# Patient Record
Sex: Male | Born: 1965
Health system: Southern US, Community
[De-identification: ages and names within clinical notes are randomized; demographics above are authoritative.]

## PROBLEM LIST (undated history)

## (undated) ENCOUNTER — Emergency Department (HOSPITAL_COMMUNITY): Admission: EM | Payer: Non-veteran care | Source: Home / Self Care

## (undated) DIAGNOSIS — C801 Malignant (primary) neoplasm, unspecified: Secondary | ICD-10-CM

## (undated) DIAGNOSIS — F4310 Post-traumatic stress disorder, unspecified: Secondary | ICD-10-CM

## (undated) DIAGNOSIS — G8929 Other chronic pain: Secondary | ICD-10-CM

## (undated) DIAGNOSIS — IMO0002 Reserved for concepts with insufficient information to code with codable children: Secondary | ICD-10-CM

## (undated) DIAGNOSIS — G4730 Sleep apnea, unspecified: Secondary | ICD-10-CM

## (undated) DIAGNOSIS — I10 Essential (primary) hypertension: Secondary | ICD-10-CM

## (undated) DIAGNOSIS — M1990 Unspecified osteoarthritis, unspecified site: Secondary | ICD-10-CM

## (undated) DIAGNOSIS — M5430 Sciatica, unspecified side: Secondary | ICD-10-CM

## (undated) DIAGNOSIS — K8020 Calculus of gallbladder without cholecystitis without obstruction: Secondary | ICD-10-CM

## (undated) DIAGNOSIS — M25569 Pain in unspecified knee: Secondary | ICD-10-CM

## (undated) DIAGNOSIS — Z933 Colostomy status: Secondary | ICD-10-CM

## (undated) DIAGNOSIS — K859 Acute pancreatitis without necrosis or infection, unspecified: Principal | ICD-10-CM

## (undated) HISTORY — PX: OTHER SURGICAL HISTORY: SHX169

## (undated) HISTORY — PX: CHOLECYSTECTOMY: SHX55

## (undated) HISTORY — PX: COLON SURGERY: SHX602

---

## 1999-09-19 ENCOUNTER — Emergency Department (HOSPITAL_COMMUNITY): Admission: EM | Admit: 1999-09-19 | Discharge: 1999-09-19 | Payer: Self-pay | Admitting: Emergency Medicine

## 2004-04-15 ENCOUNTER — Emergency Department (HOSPITAL_COMMUNITY): Admission: EM | Admit: 2004-04-15 | Discharge: 2004-04-16 | Payer: Self-pay | Admitting: Emergency Medicine

## 2004-04-15 IMAGING — CR DG SHOULDER 2+V*L*
3 series · 3 of 3 positions shown · non-contrast
Comparison: none

CLINICAL DATA: Left shoulder pain.  
 LEFT SHOULDER, THREE VIEWS
 The glenohumeral joint appears normal.  There is degenerative arthritis at the AC joint with some vacuum phenomenon in the joint and mild osteophytes.
 IMPRESSION 
 Degenerative disease of the AC joint.  No acute finding.

[view not recorded (1 of 3)]
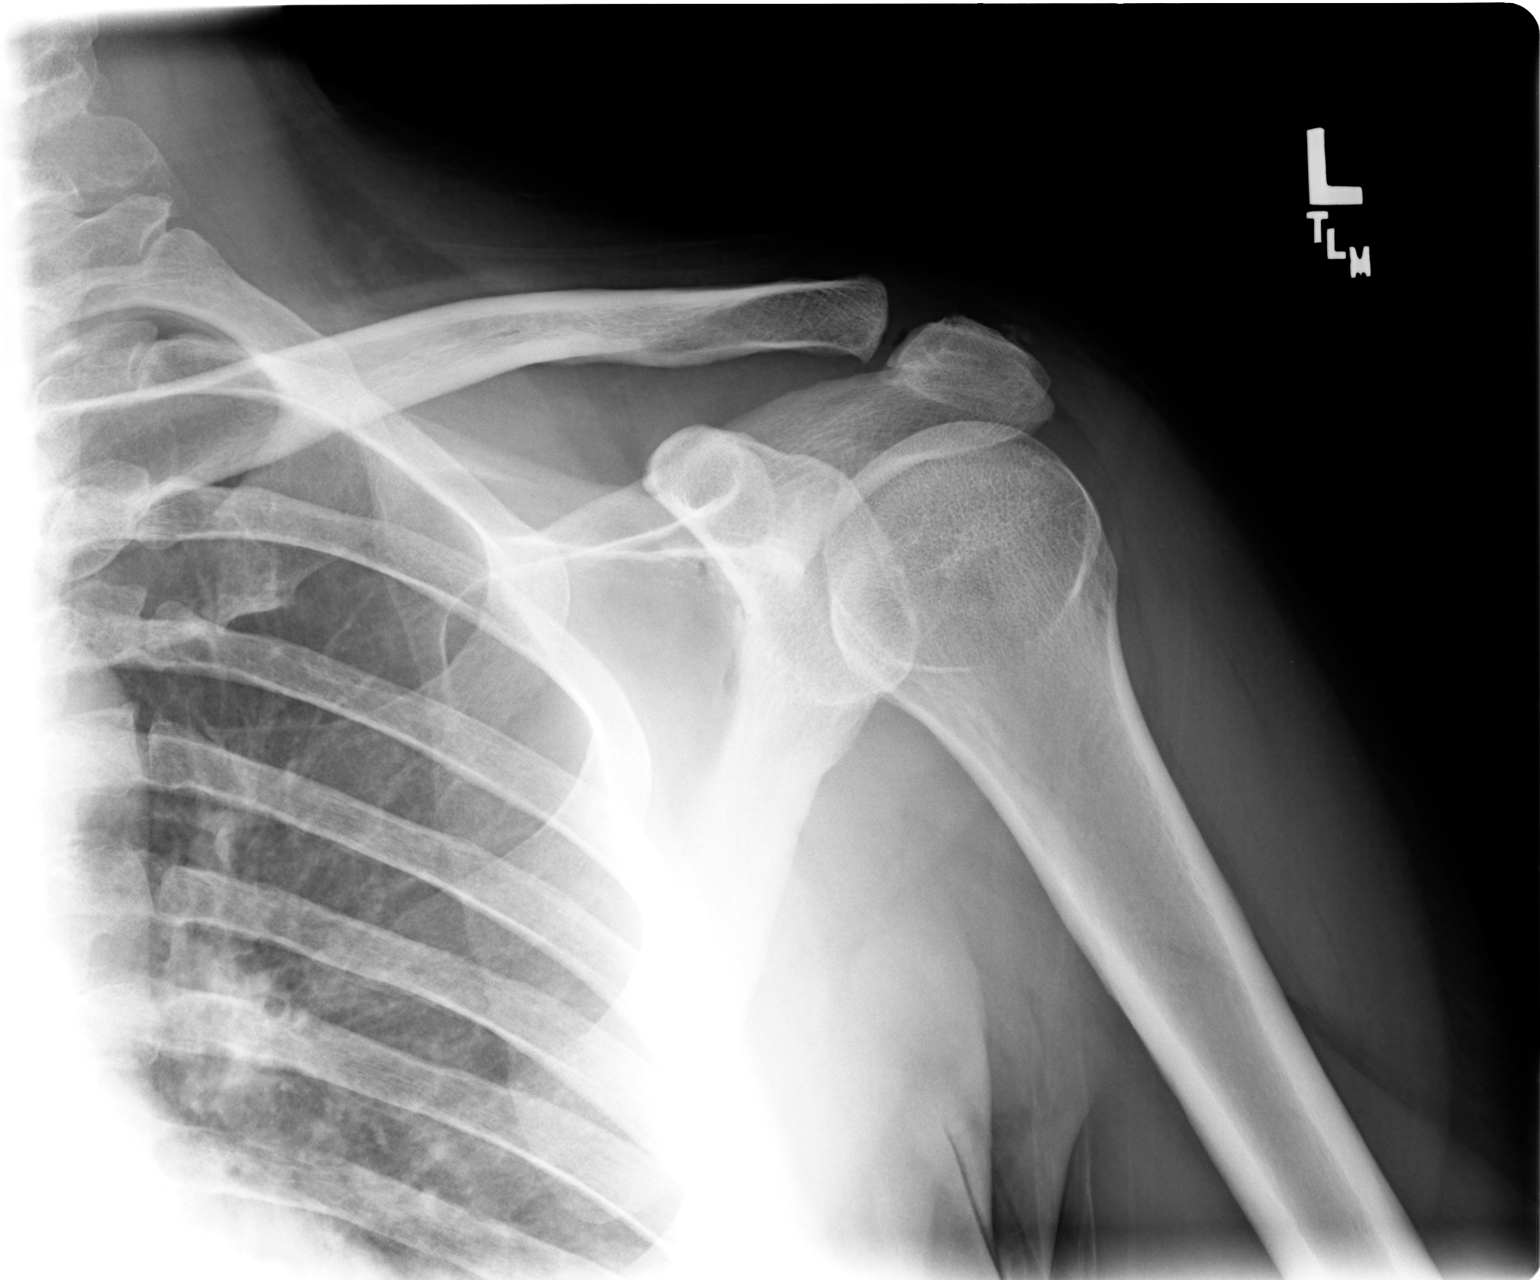

[view not recorded (2 of 3)]
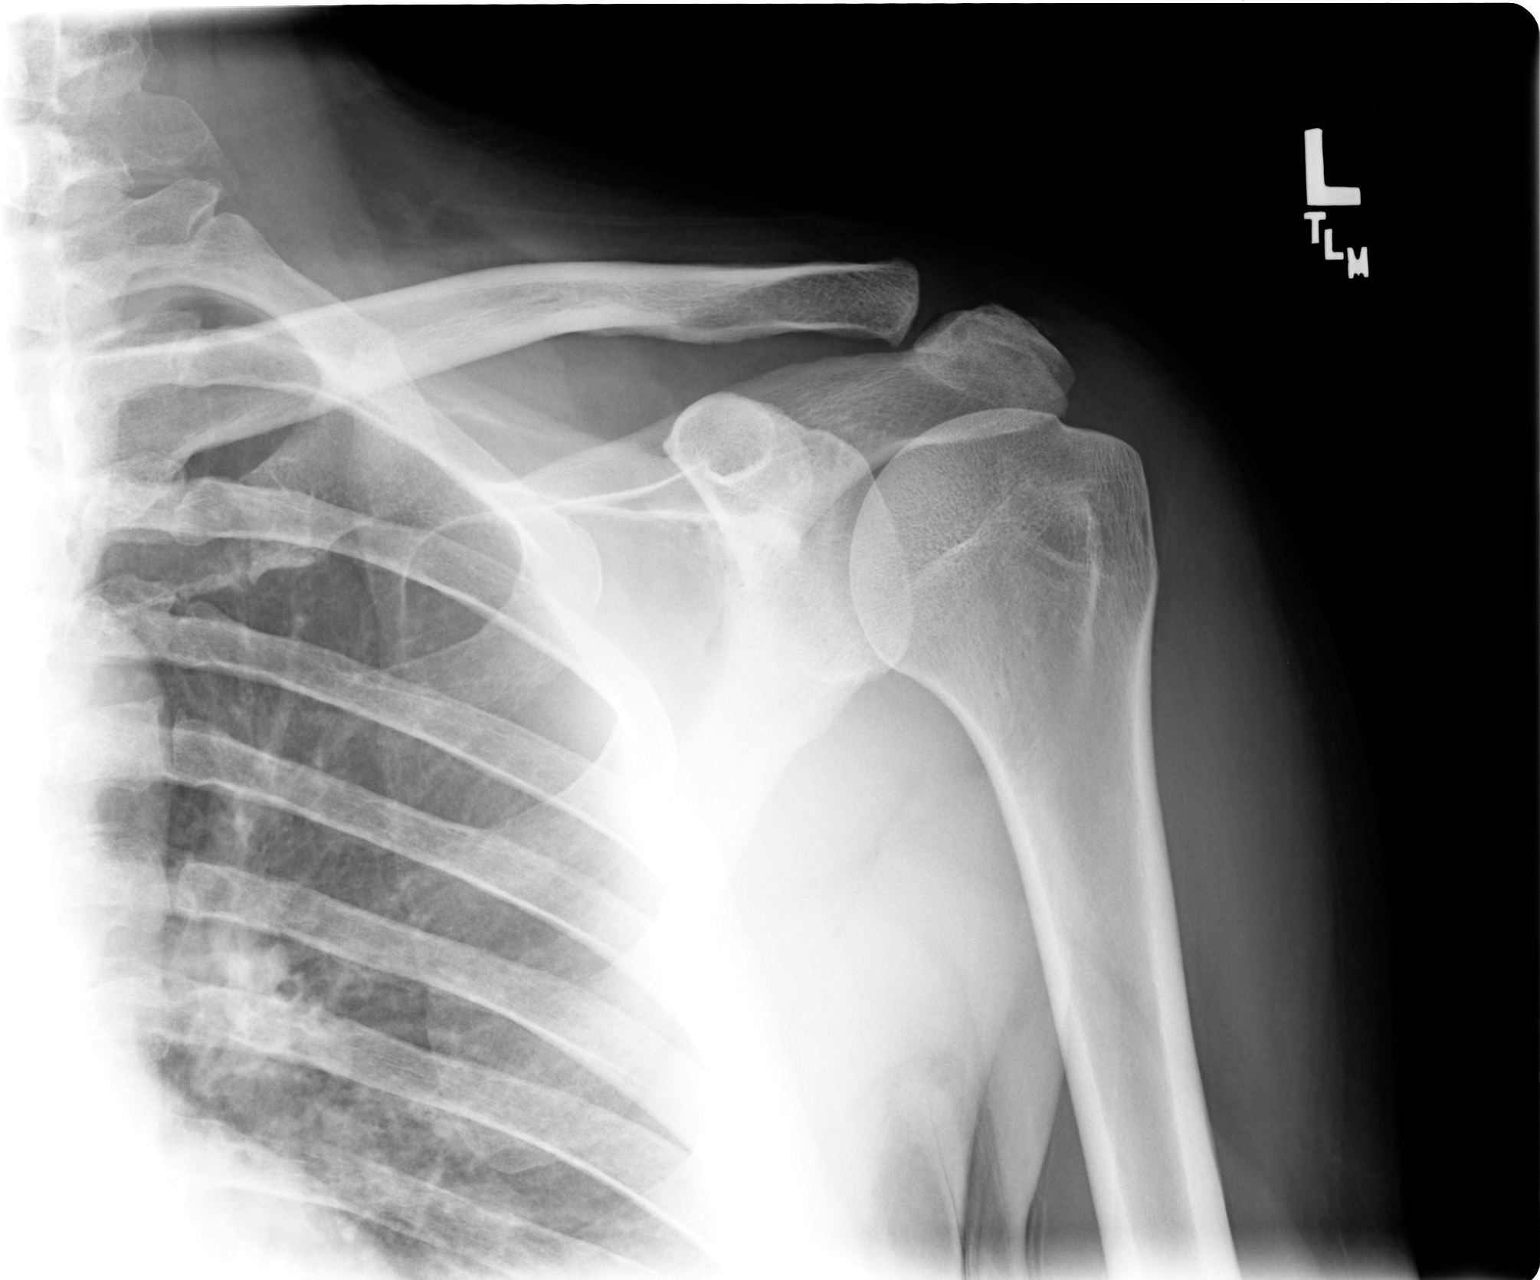

[view not recorded (3 of 3)]
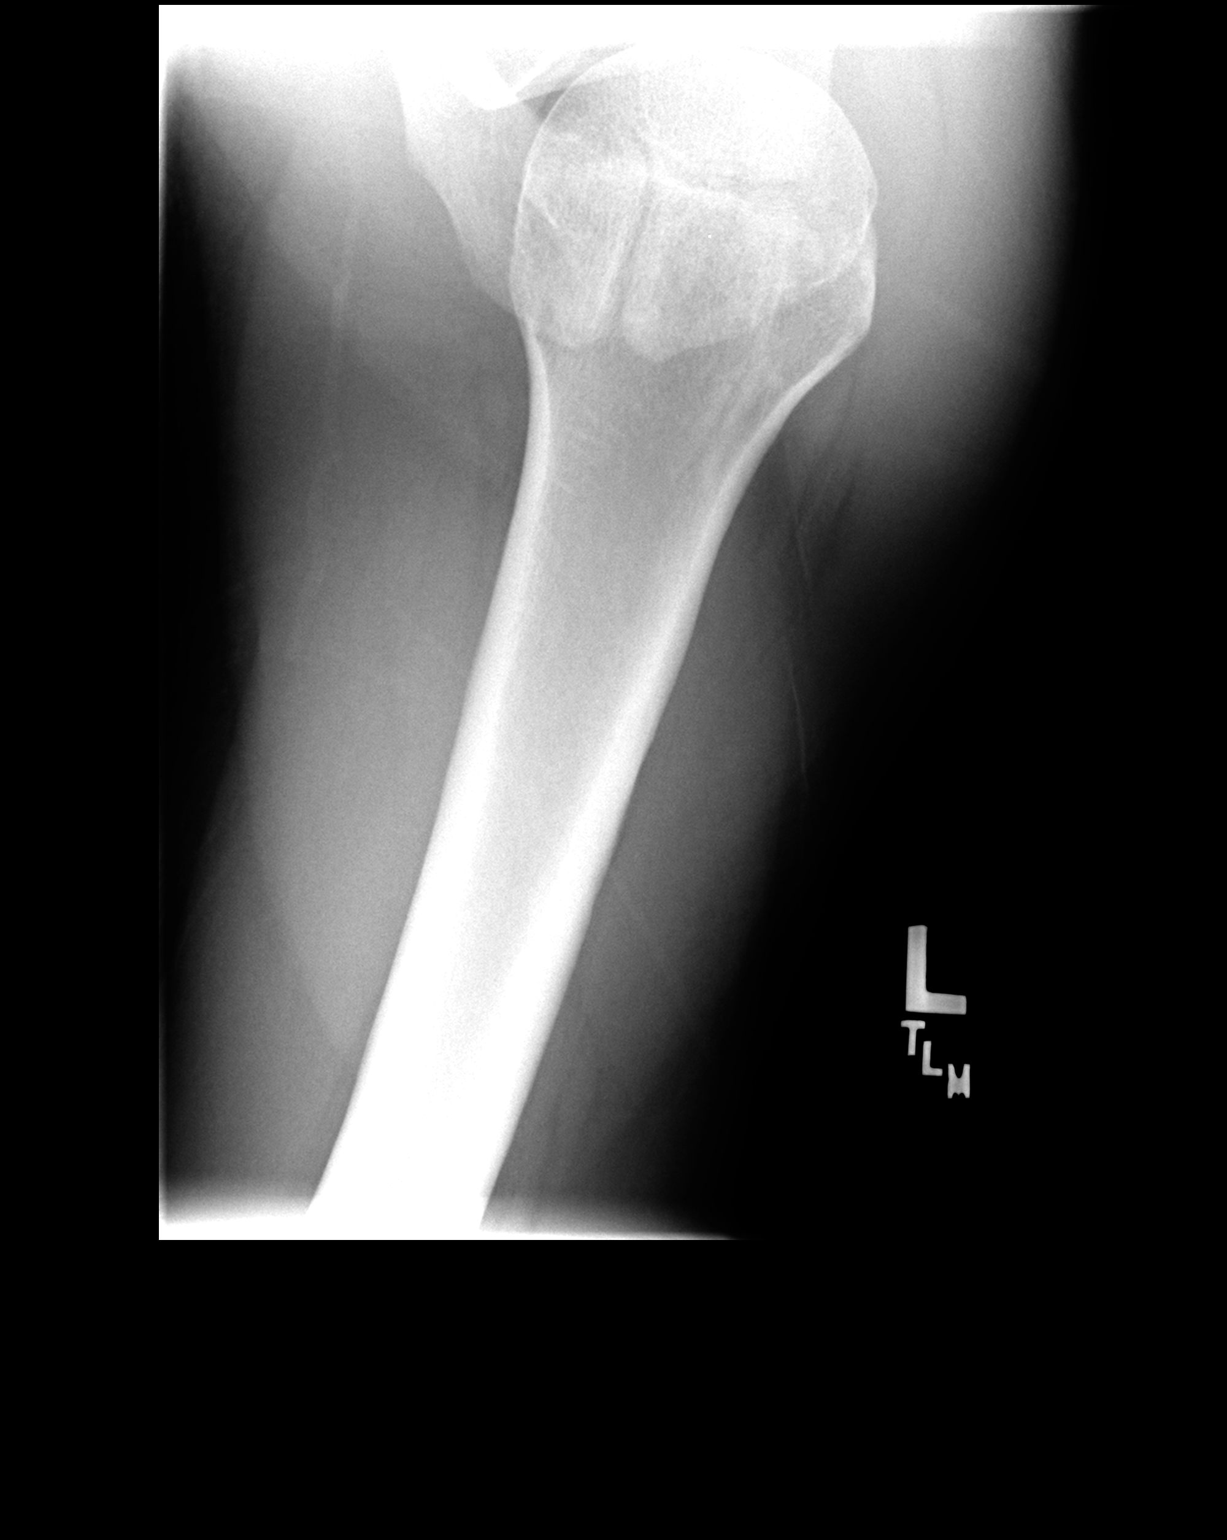

[3 of 3 positions shown; findings below may reference images not displayed]

## 2007-01-21 ENCOUNTER — Emergency Department (HOSPITAL_COMMUNITY): Admission: EM | Admit: 2007-01-21 | Discharge: 2007-01-21 | Payer: Self-pay | Admitting: Emergency Medicine

## 2008-10-17 ENCOUNTER — Emergency Department (HOSPITAL_COMMUNITY): Admission: EM | Admit: 2008-10-17 | Discharge: 2008-10-17 | Payer: Self-pay | Admitting: Emergency Medicine

## 2008-10-17 ENCOUNTER — Encounter: Payer: Self-pay | Admitting: Orthopedic Surgery

## 2008-10-17 IMAGING — CR DG KNEE COMPLETE 4+V*L*
4 series · 4 of 4 positions shown · non-contrast
Comparison: None.

CLINICAL DATA: Knee pain 2 weeks.  No known injury.

LEFT KNEE - COMPLETE 4+ VIEW

[view not recorded (1 of 4)]
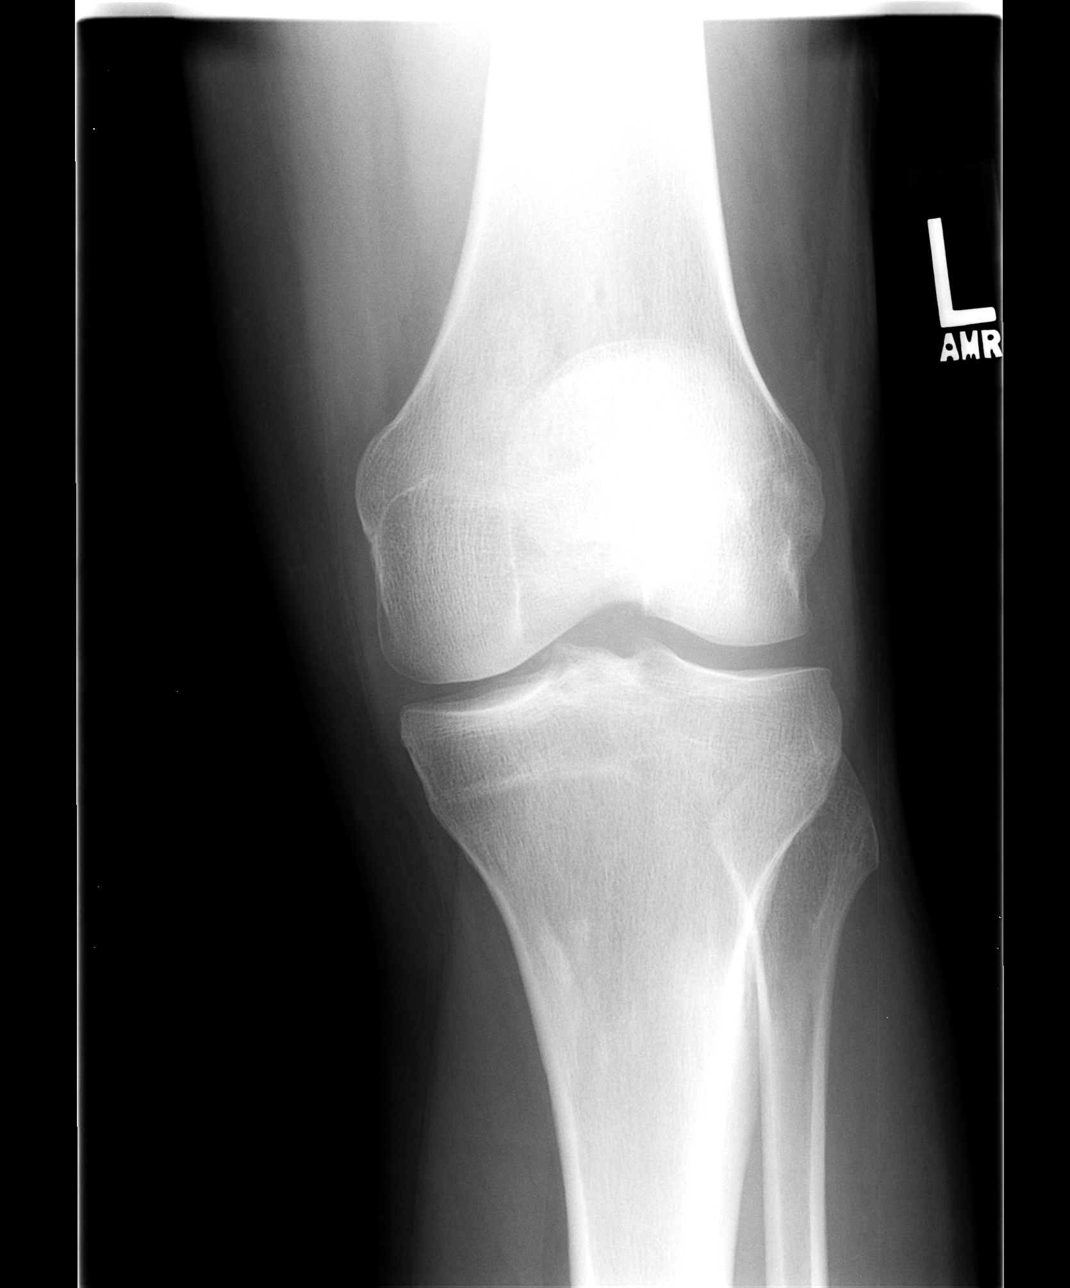

[view not recorded (2 of 4)]
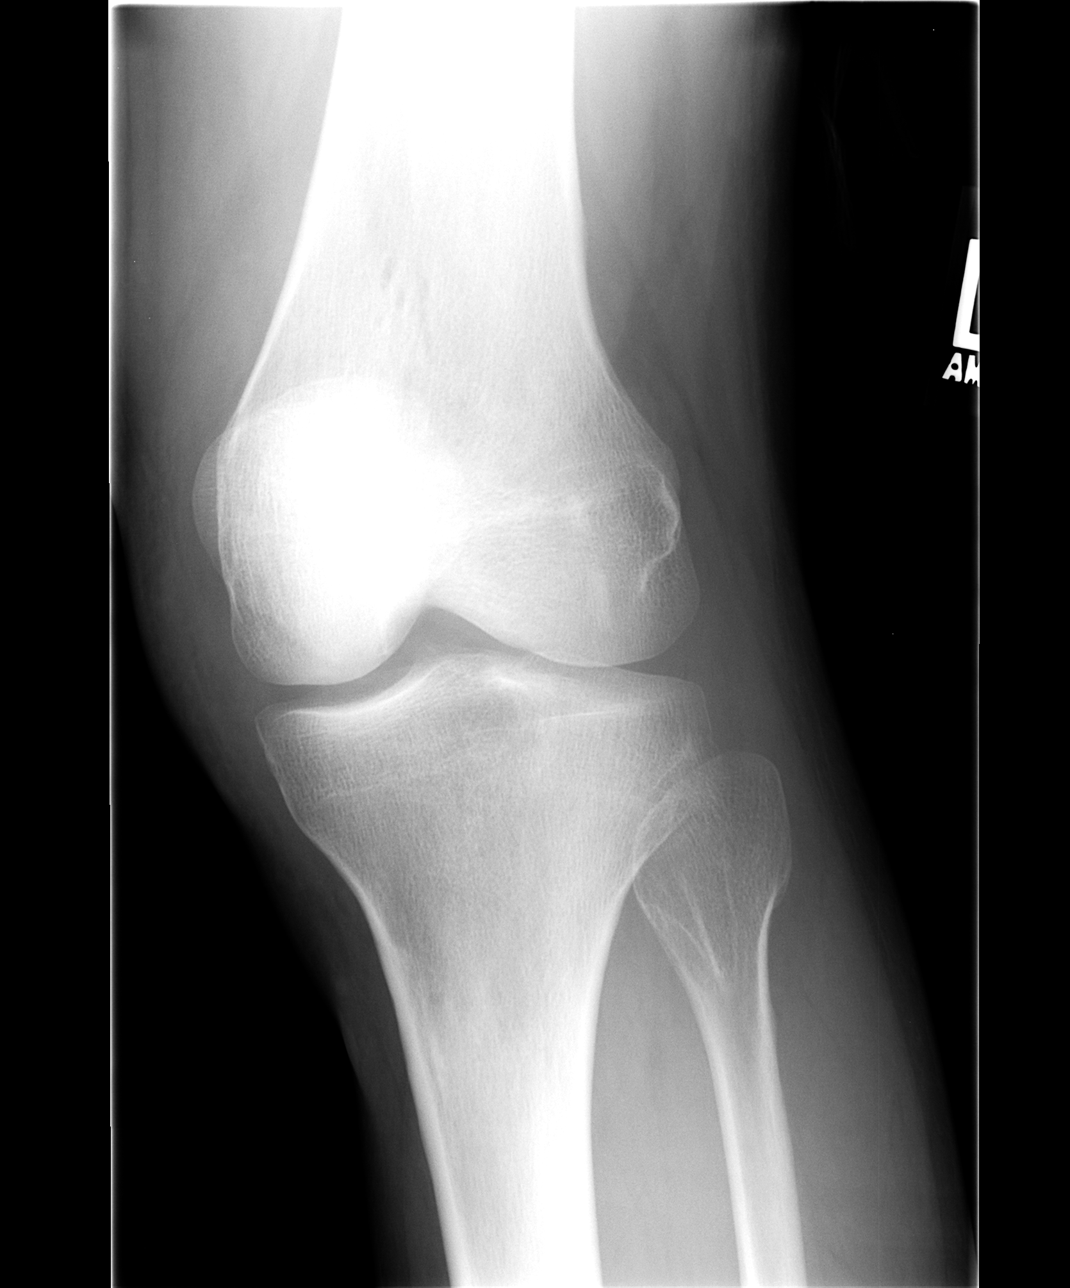

[view not recorded (3 of 4)]
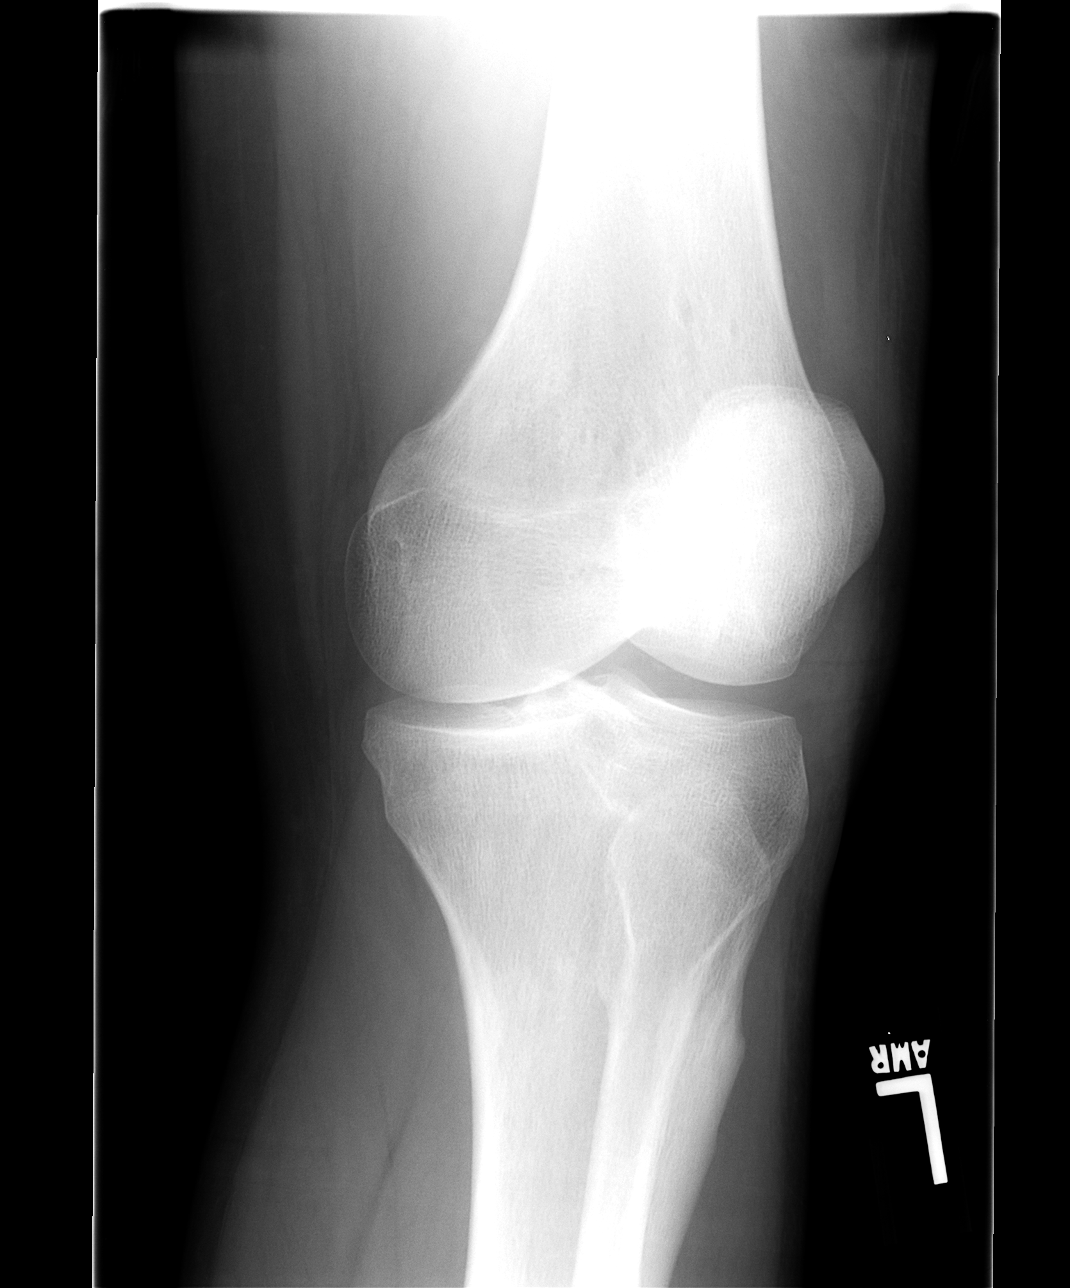

[view not recorded (4 of 4)]
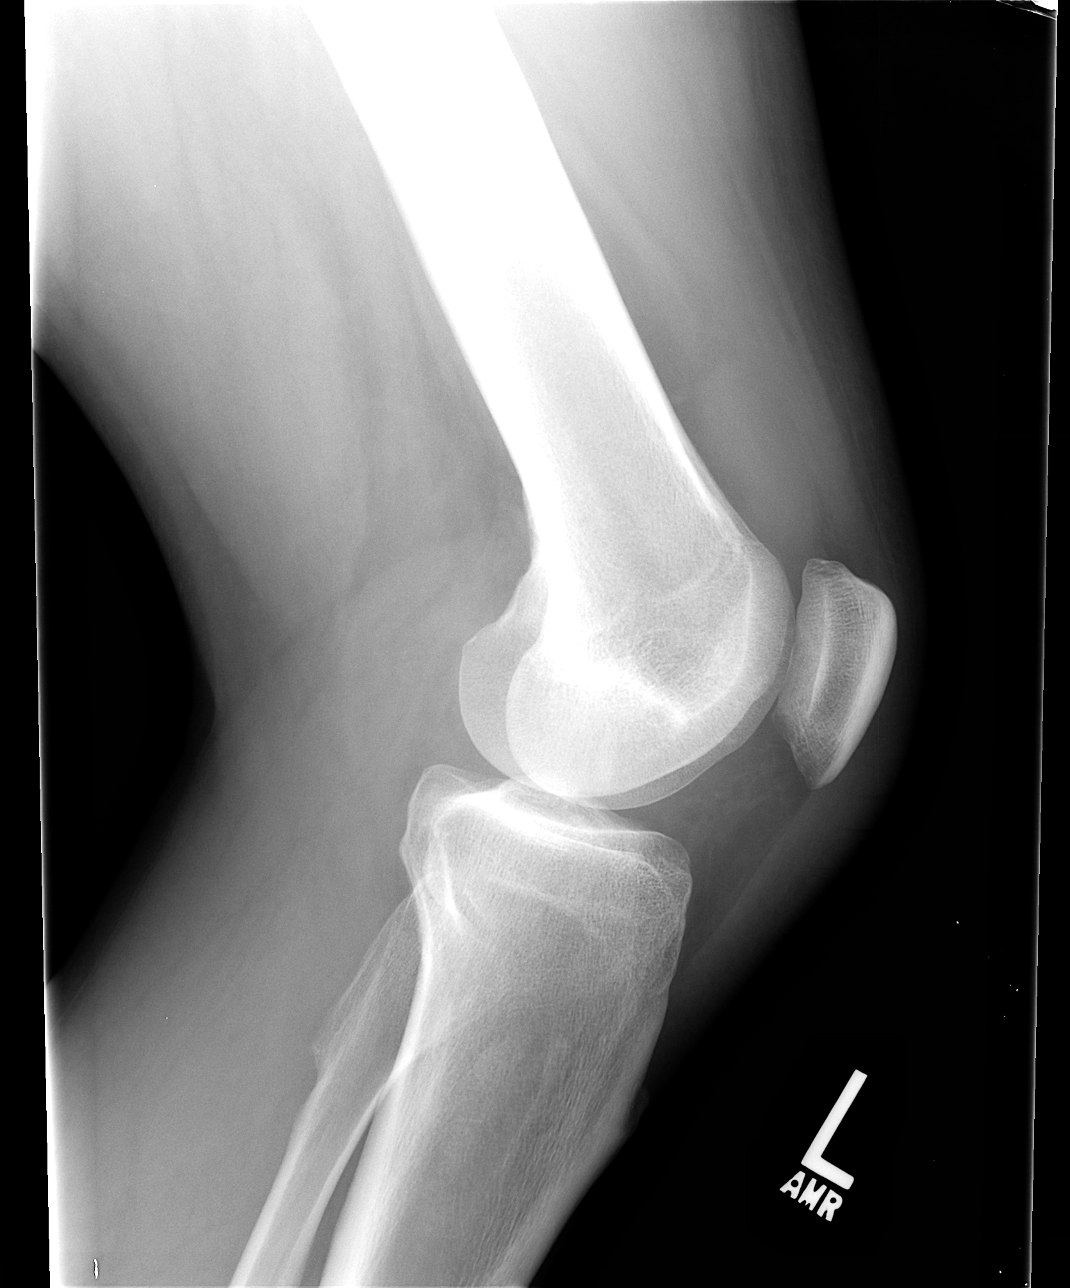

[4 of 4 positions shown; findings below may reference images not displayed]

FINDINGS: No acute fracture or dislocation.  Question exostosis
versus deformity secondary to prior left proximal fibular fracture.

Small to slightly moderate sized suprapatellar joint effusion.
Subtle lucencies distal left femur metaphysis.  Etiology
indeterminate.  If the patient as persistent or progressive
symptoms, MR imaging may be considered.
IMPRESSION: Small to slightly moderate sized suprapatellar joint effusion.
Etiology indeterminate.

Lucencies distal left femoral metaphysis.  Significance
indeterminate.  This can be evaluated with follow-up MR imaging if
there are persistent or progressive symptoms.

Question exostosis versus result of prior fracture proximal left
fibula.

## 2008-10-24 ENCOUNTER — Ambulatory Visit: Payer: Self-pay | Admitting: Orthopedic Surgery

## 2008-10-24 DIAGNOSIS — M25569 Pain in unspecified knee: Secondary | ICD-10-CM

## 2008-10-24 DIAGNOSIS — M23302 Other meniscus derangements, unspecified lateral meniscus, unspecified knee: Secondary | ICD-10-CM | POA: Insufficient documentation

## 2008-10-24 DIAGNOSIS — M25469 Effusion, unspecified knee: Secondary | ICD-10-CM

## 2008-11-10 ENCOUNTER — Telehealth: Payer: Self-pay | Admitting: Orthopedic Surgery

## 2008-11-11 ENCOUNTER — Encounter: Payer: Self-pay | Admitting: Orthopedic Surgery

## 2008-11-14 ENCOUNTER — Telehealth: Payer: Self-pay | Admitting: Orthopedic Surgery

## 2008-11-24 ENCOUNTER — Telehealth: Payer: Self-pay | Admitting: Orthopedic Surgery

## 2009-05-24 ENCOUNTER — Ambulatory Visit: Payer: Self-pay | Admitting: Orthopedic Surgery

## 2009-05-24 DIAGNOSIS — M5430 Sciatica, unspecified side: Secondary | ICD-10-CM

## 2009-10-09 ENCOUNTER — Emergency Department (HOSPITAL_COMMUNITY): Admission: EM | Admit: 2009-10-09 | Discharge: 2009-10-09 | Payer: Self-pay | Admitting: Emergency Medicine

## 2009-10-09 IMAGING — CR DG CHEST 2V
2 series · 2 of 2 positions shown · non-contrast
Comparison: None

CLINICAL DATA: Epigastric pain, shortness of breath

CHEST - 2 VIEW

[view not recorded (1 of 2)]
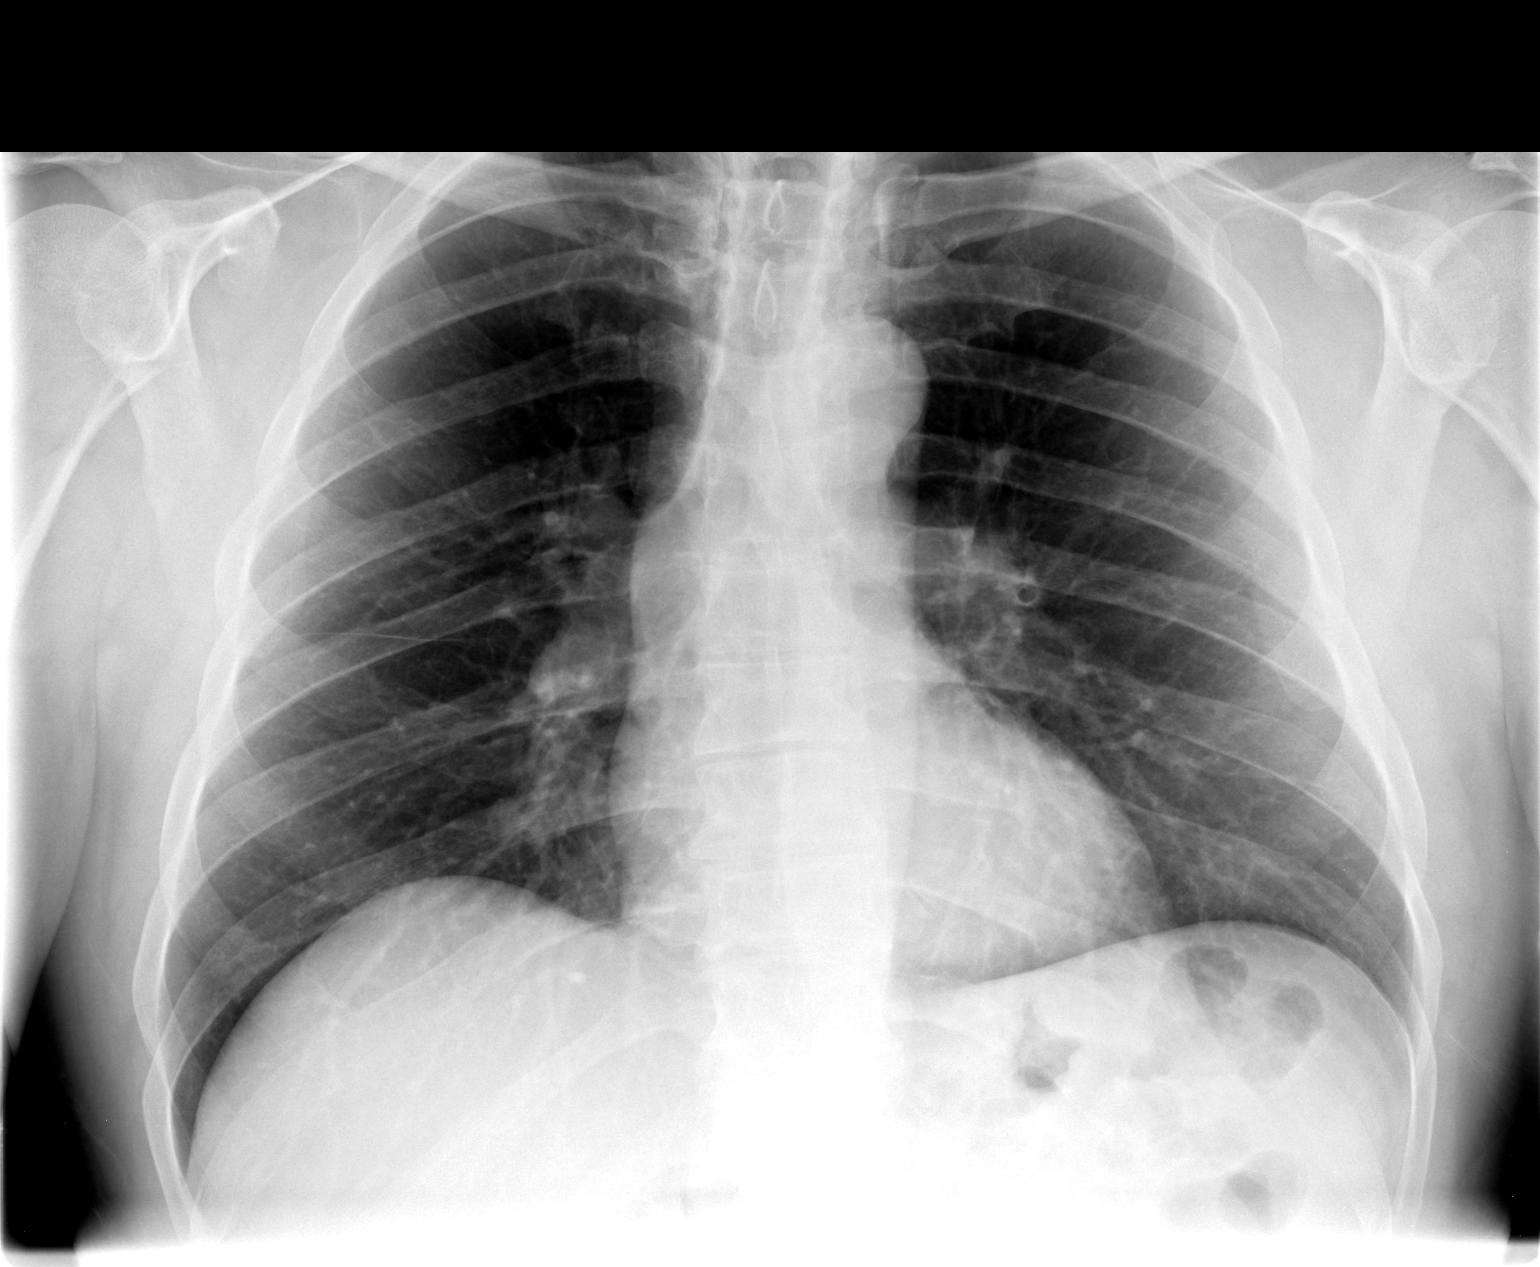

[view not recorded (2 of 2)]
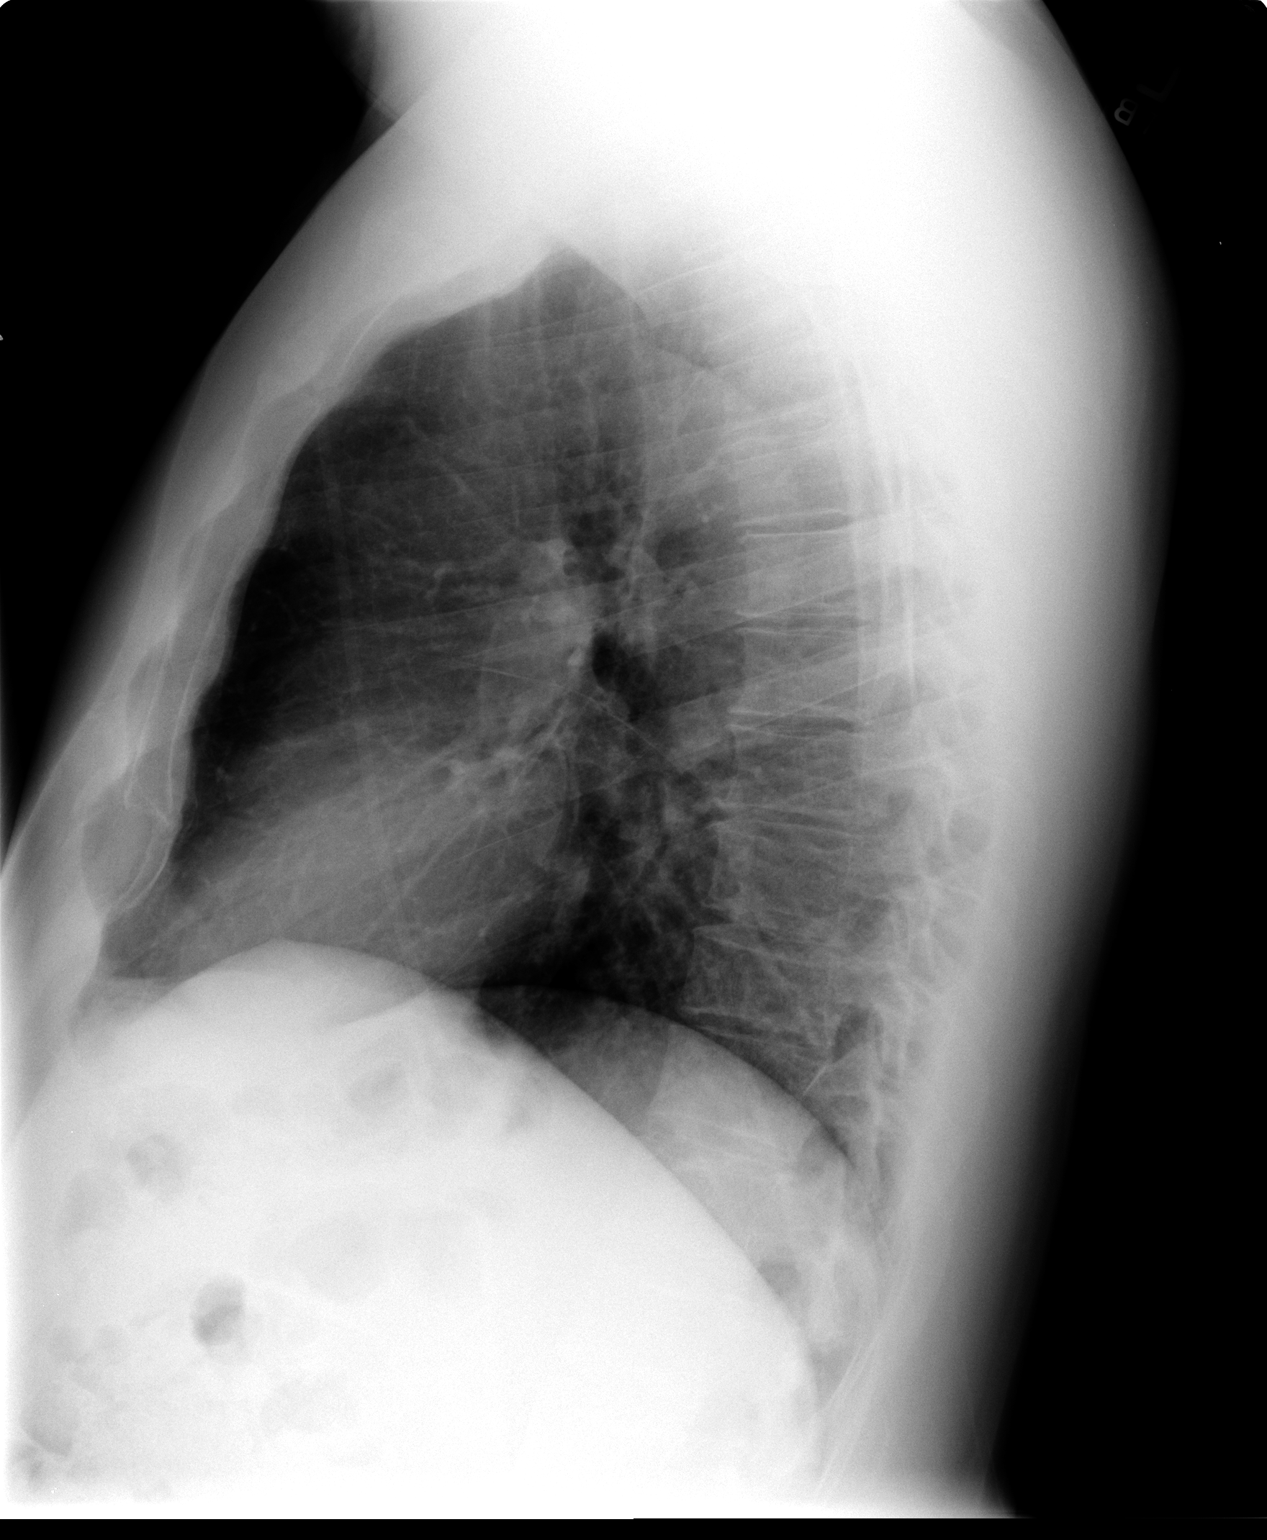

[2 of 2 positions shown; findings below may reference images not displayed]

FINDINGS: Normal heart size, mediastinal contours, and pulmonary vascularity.
Bronchitic changes without infiltrate or effusion.
Bones unremarkable.
No pneumothorax.
IMPRESSION: Mild bronchitic changes.

## 2010-10-14 ENCOUNTER — Encounter: Payer: Self-pay | Admitting: Orthopedic Surgery

## 2010-12-09 LAB — POCT I-STAT, CHEM 8
BUN: 9 mg/dL (ref 6–23)
Calcium, Ion: 1.15 mmol/L (ref 1.12–1.32)
Chloride: 105 mEq/L (ref 96–112)
Glucose, Bld: 92 mg/dL (ref 70–99)
HCT: 55 % — ABNORMAL HIGH (ref 39.0–52.0)

## 2011-01-07 LAB — SYNOVIAL CELL COUNT + DIFF, W/ CRYSTALS
Eosinophils-Synovial: 1 % (ref 0–1)
Monocyte-Macrophage-Synovial Fluid: 19 % — ABNORMAL LOW (ref 50–90)

## 2011-02-10 ENCOUNTER — Emergency Department (HOSPITAL_COMMUNITY)
Admission: EM | Admit: 2011-02-10 | Discharge: 2011-02-10 | Disposition: A | Discharge status: Home / Self Care | Payer: Self-pay | Attending: Emergency Medicine | Admitting: Emergency Medicine

## 2011-02-10 ENCOUNTER — Emergency Department (HOSPITAL_COMMUNITY): Payer: Self-pay

## 2011-02-10 DIAGNOSIS — S81009A Unspecified open wound, unspecified knee, initial encounter: Secondary | ICD-10-CM | POA: Insufficient documentation

## 2011-02-10 DIAGNOSIS — Y92009 Unspecified place in unspecified non-institutional (private) residence as the place of occurrence of the external cause: Secondary | ICD-10-CM | POA: Insufficient documentation

## 2011-02-10 DIAGNOSIS — M79609 Pain in unspecified limb: Secondary | ICD-10-CM | POA: Insufficient documentation

## 2011-02-10 DIAGNOSIS — W268XXA Contact with other sharp object(s), not elsewhere classified, initial encounter: Secondary | ICD-10-CM | POA: Insufficient documentation

## 2011-02-10 DIAGNOSIS — Y93H2 Activity, gardening and landscaping: Secondary | ICD-10-CM | POA: Insufficient documentation

## 2011-02-10 DIAGNOSIS — S81809A Unspecified open wound, unspecified lower leg, initial encounter: Secondary | ICD-10-CM | POA: Insufficient documentation

## 2011-02-10 IMAGING — CR DG TIBIA/FIBULA 2V*R*
4 series · 4 of 4 positions shown · non-contrast
Comparison: None.

CLINICAL DATA: Lawn mower accident with medial lower leg
laceration.

RIGHT TIBIA AND FIBULA - 2 VIEW

[t tib/fib ap right (1 of 2)]
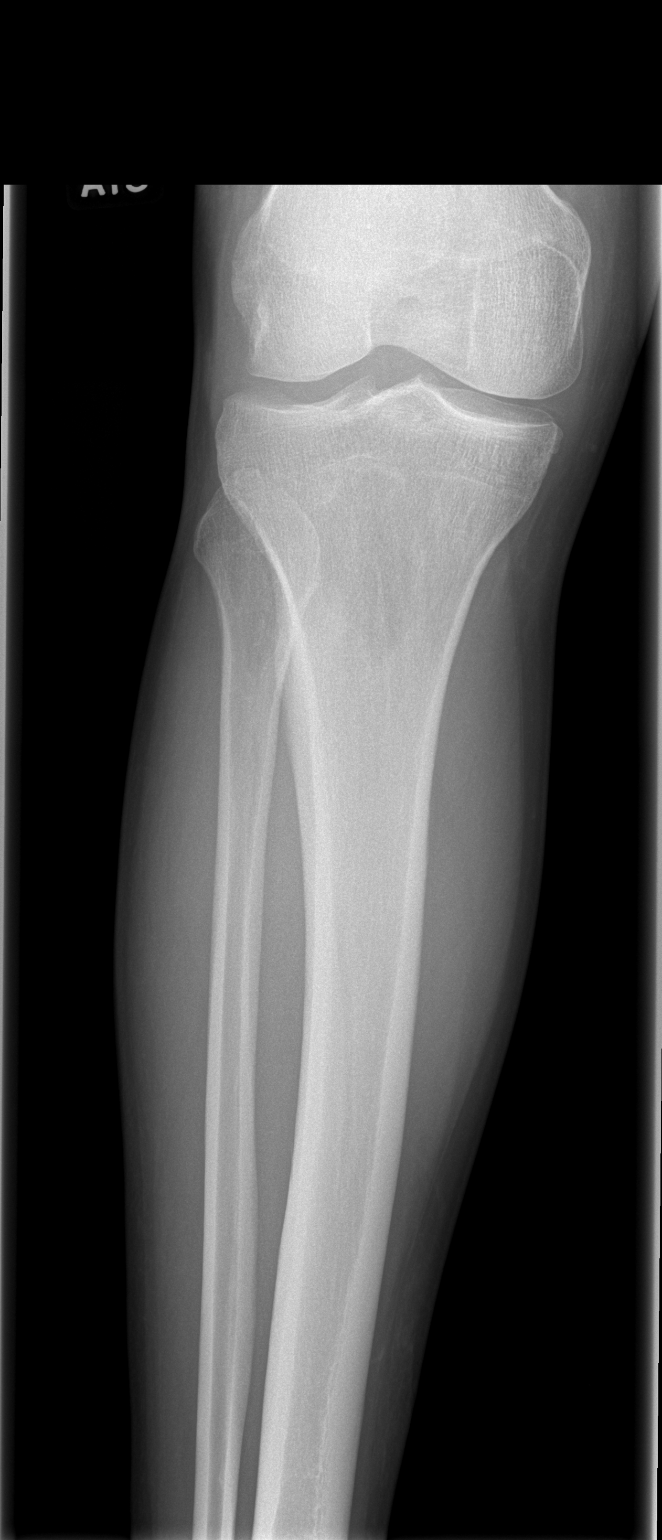

[t tib/fib ap right (2 of 2)]
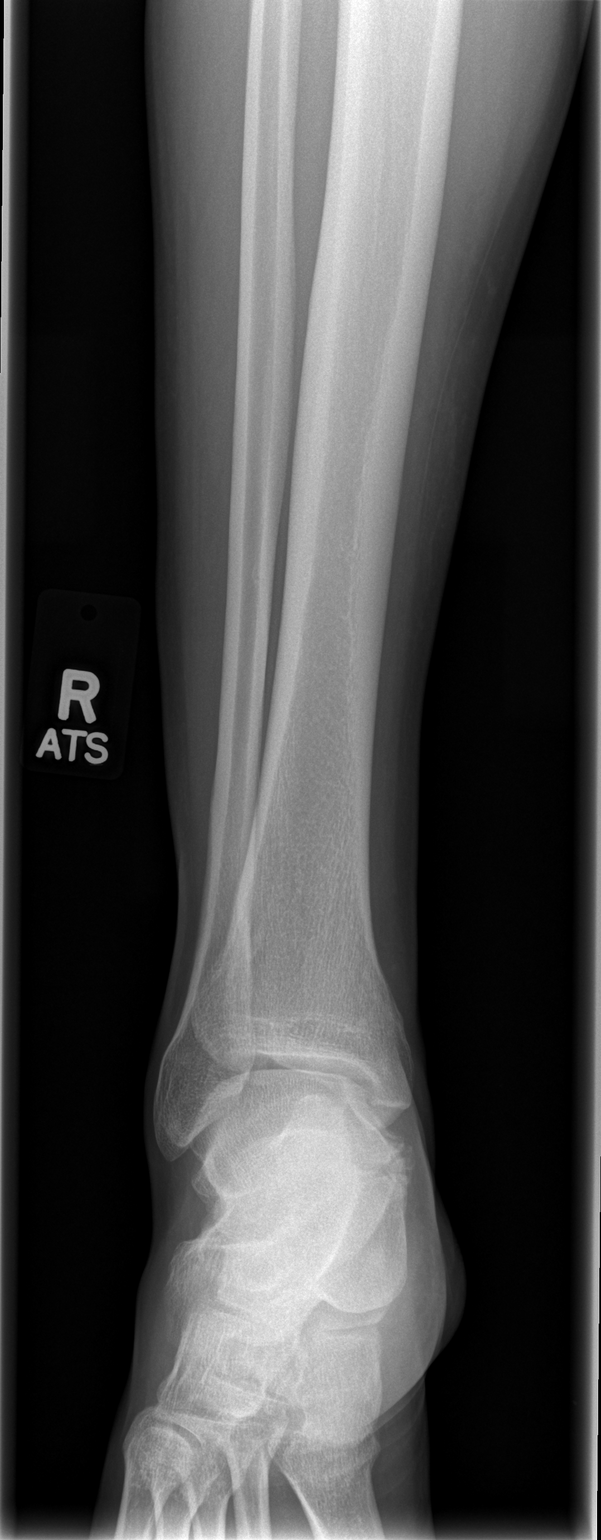

[t tib/fib lat right (1 of 2)]
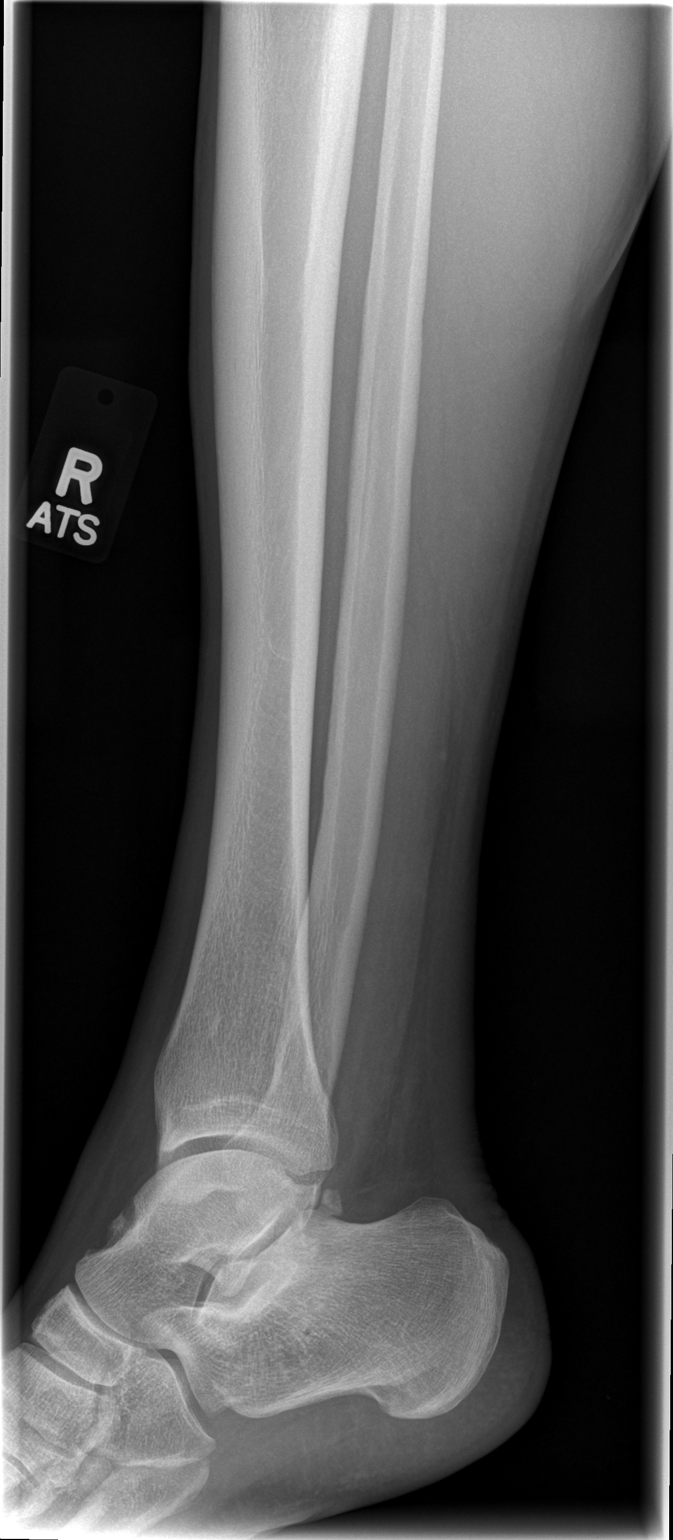

[t tib/fib lat right (2 of 2)]
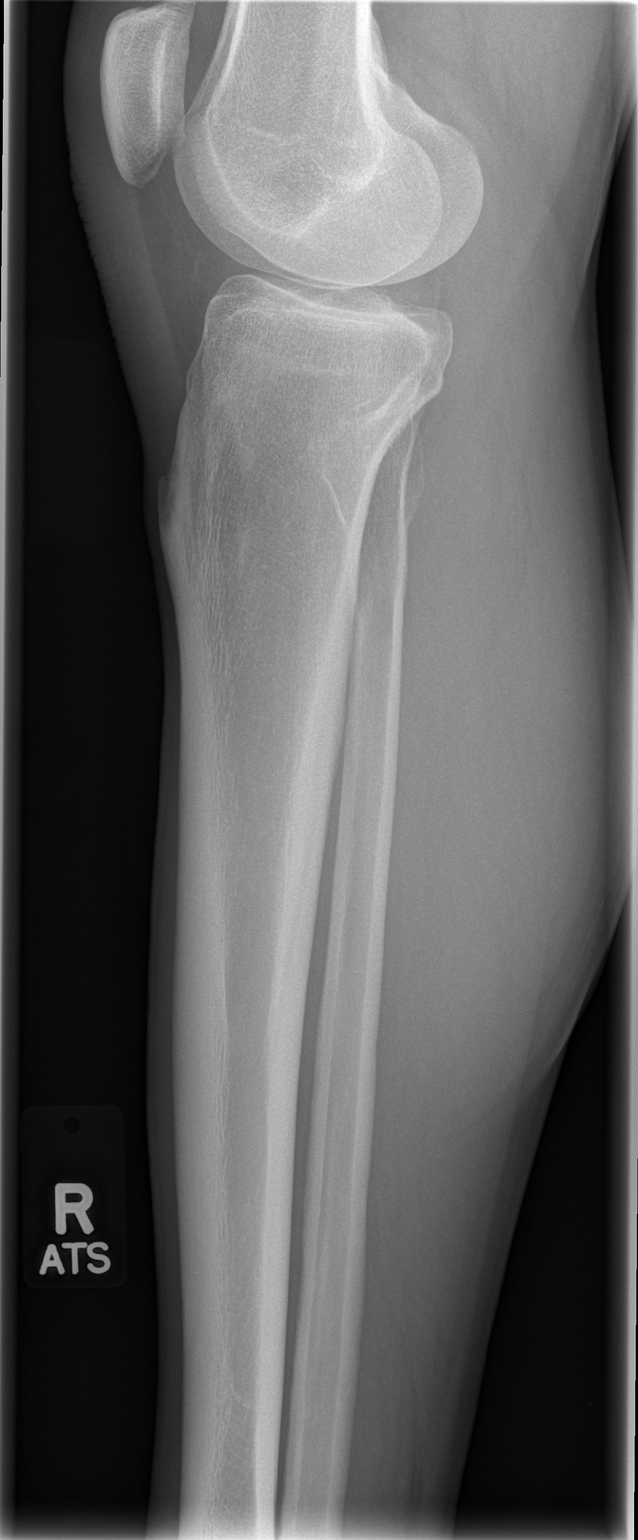

[4 of 4 positions shown; findings below may reference images not displayed]

FINDINGS: There is mild medial soft tissue swelling and
irregularity.  No foreign body or soft tissue emphysema is
demonstrated.  There is no evidence of acute fracture or
dislocation.
IMPRESSION: Soft tissue injury.  No foreign body or acute osseous findings
identified.

## 2011-02-11 ENCOUNTER — Emergency Department (HOSPITAL_COMMUNITY)
Admission: EM | Admit: 2011-02-11 | Discharge: 2011-02-11 | Disposition: A | Discharge status: Home / Self Care | Payer: Self-pay | Attending: Emergency Medicine | Admitting: Emergency Medicine

## 2011-02-11 DIAGNOSIS — M79609 Pain in unspecified limb: Secondary | ICD-10-CM | POA: Insufficient documentation

## 2011-02-26 ENCOUNTER — Emergency Department (HOSPITAL_COMMUNITY)
Admission: EM | Admit: 2011-02-26 | Discharge: 2011-02-26 | Disposition: A | Discharge status: Home / Self Care | Payer: Self-pay | Attending: Emergency Medicine | Admitting: Emergency Medicine

## 2011-02-26 DIAGNOSIS — F17200 Nicotine dependence, unspecified, uncomplicated: Secondary | ICD-10-CM | POA: Insufficient documentation

## 2011-02-26 DIAGNOSIS — Z4802 Encounter for removal of sutures: Secondary | ICD-10-CM | POA: Insufficient documentation

## 2011-11-08 ENCOUNTER — Emergency Department (HOSPITAL_COMMUNITY): Payer: Self-pay

## 2011-11-08 ENCOUNTER — Emergency Department (HOSPITAL_COMMUNITY)
Admission: EM | Admit: 2011-11-08 | Discharge: 2011-11-08 | Disposition: A | Discharge status: Home / Self Care | Payer: Self-pay | Attending: Emergency Medicine | Admitting: Emergency Medicine

## 2011-11-08 ENCOUNTER — Encounter (HOSPITAL_COMMUNITY): Payer: Self-pay

## 2011-11-08 DIAGNOSIS — IMO0002 Reserved for concepts with insufficient information to code with codable children: Secondary | ICD-10-CM | POA: Insufficient documentation

## 2011-11-08 DIAGNOSIS — M25469 Effusion, unspecified knee: Secondary | ICD-10-CM | POA: Insufficient documentation

## 2011-11-08 DIAGNOSIS — M25461 Effusion, right knee: Secondary | ICD-10-CM

## 2011-11-08 DIAGNOSIS — X500XXA Overexertion from strenuous movement or load, initial encounter: Secondary | ICD-10-CM | POA: Insufficient documentation

## 2011-11-08 DIAGNOSIS — S8391XA Sprain of unspecified site of right knee, initial encounter: Secondary | ICD-10-CM

## 2011-11-08 DIAGNOSIS — M25569 Pain in unspecified knee: Secondary | ICD-10-CM | POA: Insufficient documentation

## 2011-11-08 IMAGING — CR DG KNEE COMPLETE 4+V*R*
4 series · 4 of 4 positions shown · non-contrast
Comparison: [DATE]

CLINICAL DATA: Pain post injury

RIGHT KNEE - COMPLETE 4+ VIEW

[view not recorded (1 of 4)]
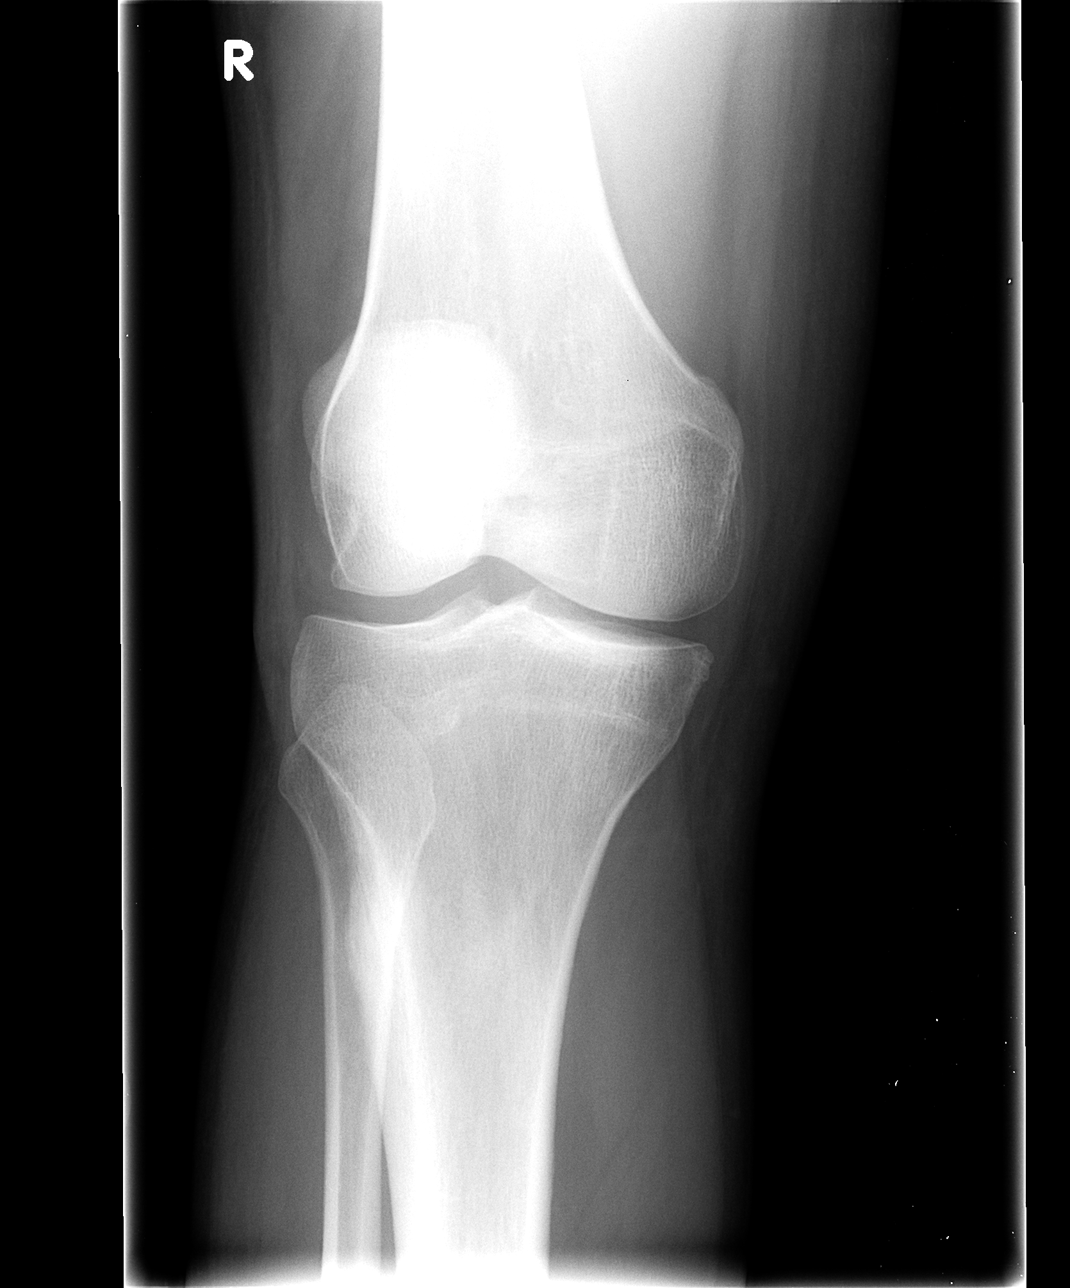

[view not recorded (2 of 4)]
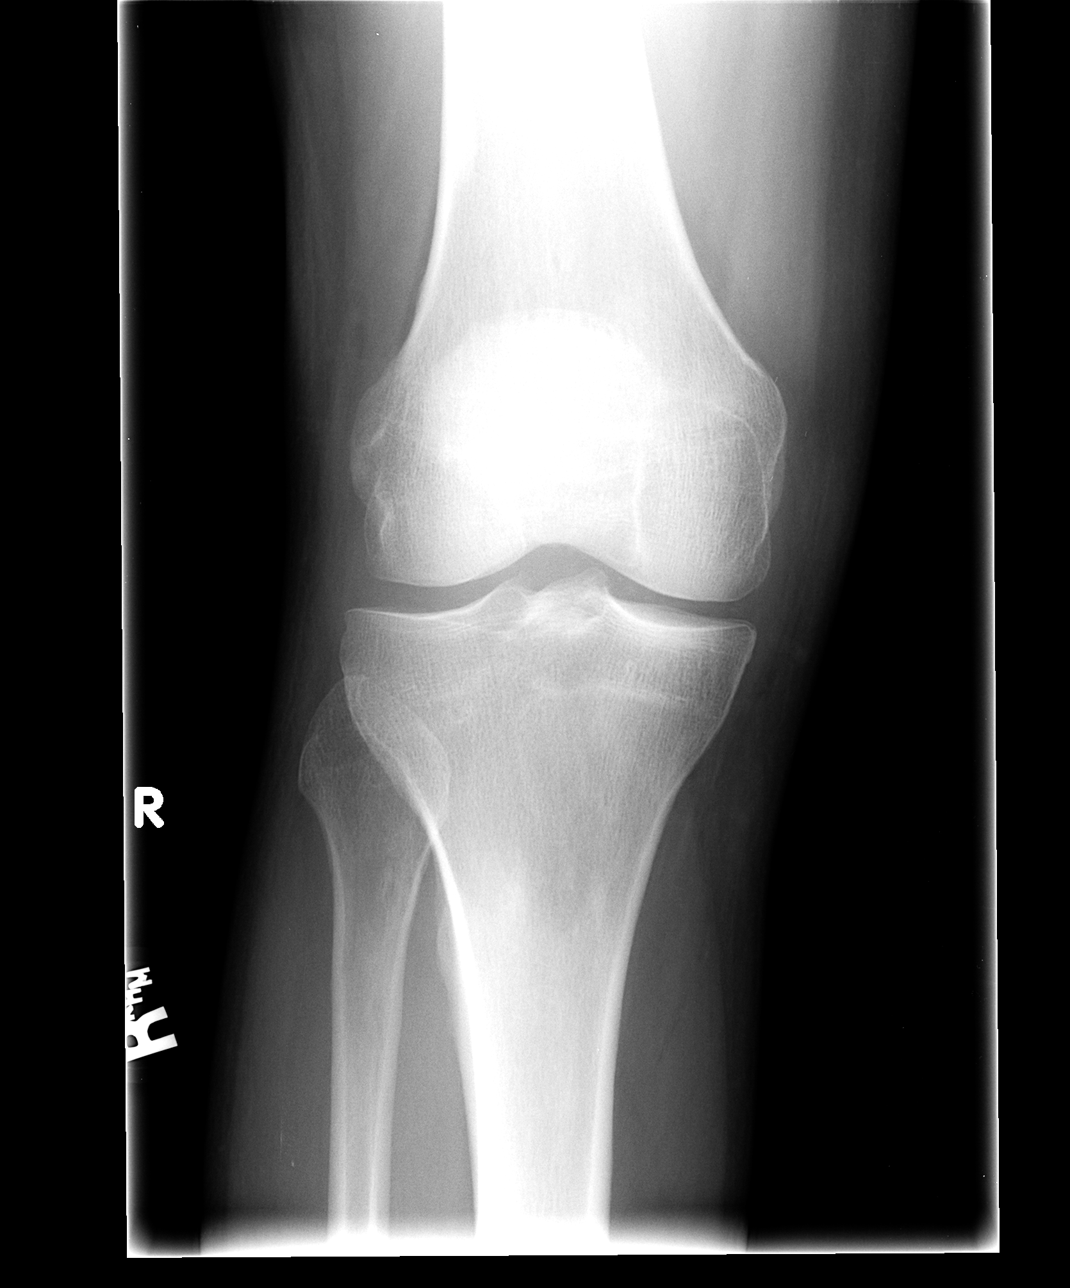

[view not recorded (3 of 4)]
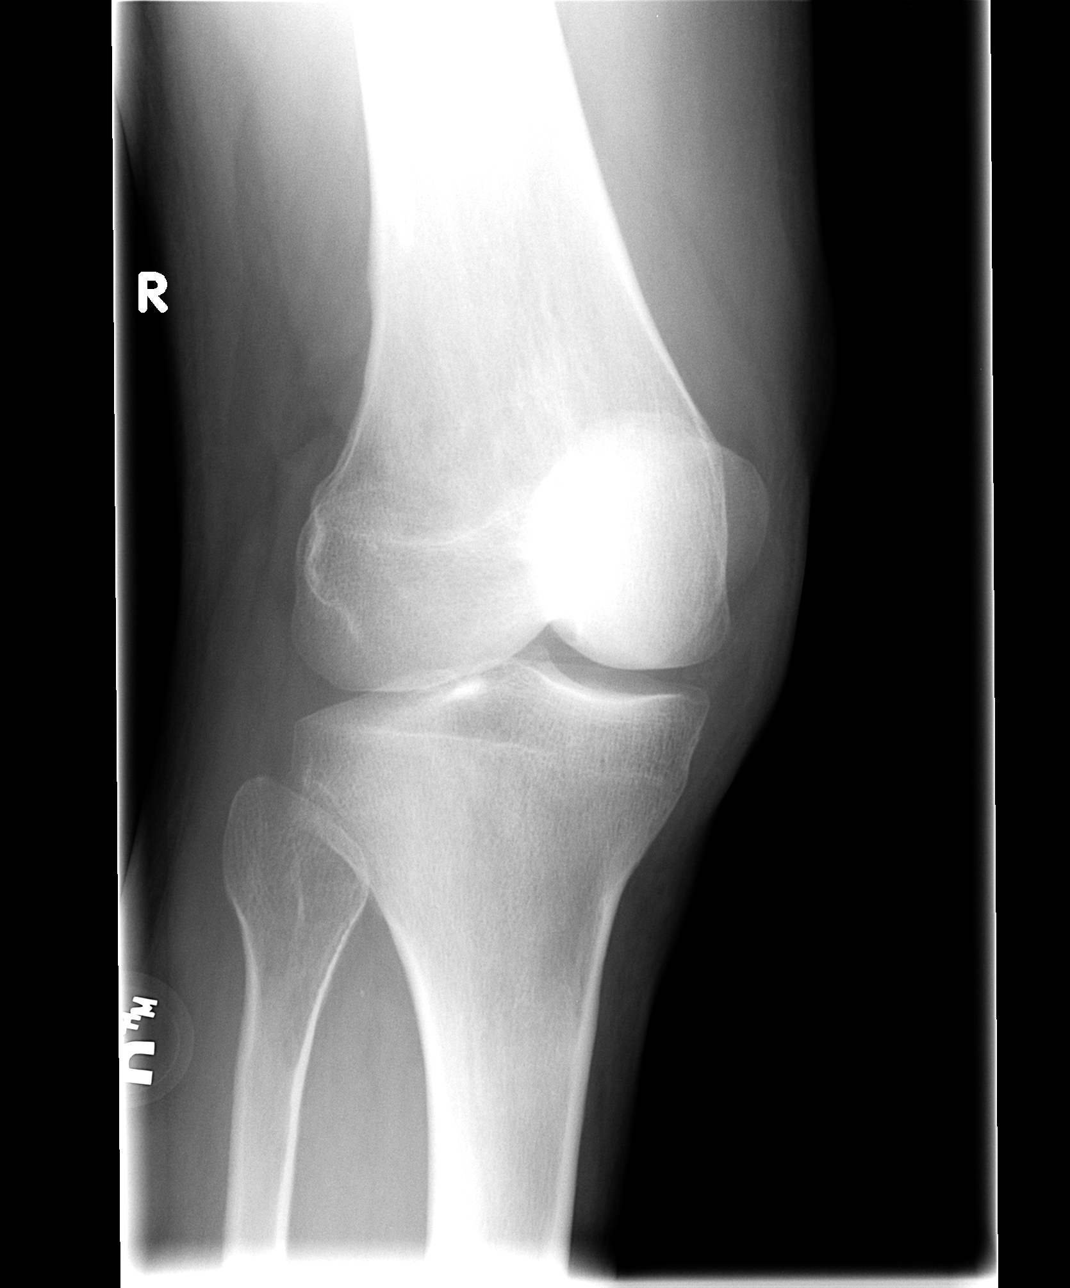

[view not recorded (4 of 4)]
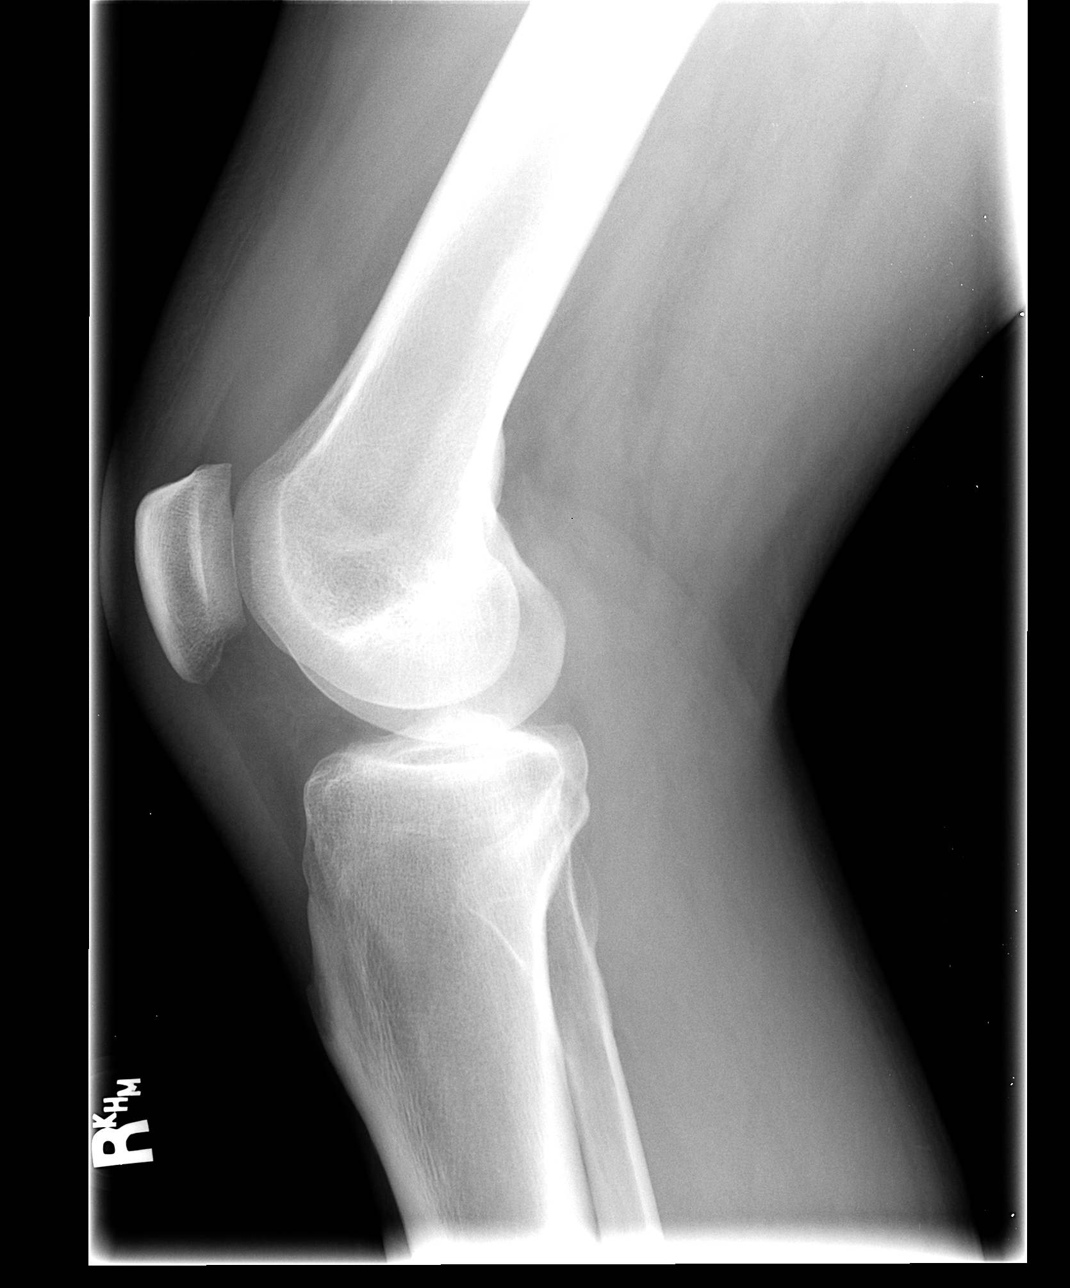

[4 of 4 positions shown; findings below may reference images not displayed]

FINDINGS: Four views of the right knee submitted.  No acute
fracture or subluxation.  Mild narrowing of medial joint
compartment.  Question small joint effusion.
IMPRESSION: No acute fracture or subluxation.  Question small joint effusion.

## 2011-11-08 MED ORDER — HYDROCODONE-ACETAMINOPHEN 5-325 MG PO TABS: ORAL_TABLET | ORAL | Status: DC

## 2011-11-08 MED ORDER — IBUPROFEN 800 MG PO TABS
800.0000 mg | ORAL_TABLET | Freq: Once | ORAL | Status: AC
  Administered 2011-11-08: 800 mg via ORAL
  Filled 2011-11-08: qty 1

## 2011-11-08 NOTE — ED Provider Notes (Signed)
History     CSN: [id number]  Arrival date & time 11/08/11  1200   First MD Initiated Contact with Patient 11/08/11 1301      Chief Complaint  Patient presents with  . Knee Pain    (Consider location/radiation/quality/duration/timing/severity/associated sxs/prior treatment) HPI Comments: Was working in yard and R leg skipped in loose dirt.  R knee "popped" and he has had pain since.  Patient is a 46 y.o. male presenting with knee pain. The history is provided by the patient. No language interpreter was used.  Knee Pain This is a new problem. The current episode started today. The problem occurs constantly. The problem has been unchanged. The symptoms are aggravated by bending, twisting and walking. He has tried nothing for the symptoms.    History reviewed. No pertinent past medical history.  History reviewed. No pertinent past surgical history.  No family history on file.  History  Substance Use Topics  . Smoking status: Current Everyday Smoker -- 1.0 packs/day  . Smokeless tobacco: Not on file  . Alcohol Use: Yes     occasional      Review of Systems  Musculoskeletal:       Knee pain  All other systems reviewed and are negative.    Allergies  Review of patient's allergies indicates no known allergies.  Home Medications  No current outpatient prescriptions on file.  BP 133/89  Pulse 80  Temp(Src) 97.8 F (36.6 C) (Oral)  Resp 20  Ht 5\' 10"  (1.778 m)  Wt 200 lb (90.719 kg)  BMI 28.70 kg/m2  SpO2 100%  Physical Exam  Nursing note and vitals reviewed. Constitutional: He is oriented to person, place, and time. He appears well-developed and well-nourished.  HENT:  Head: Normocephalic and atraumatic.  Eyes: EOM are normal.  Neck: Normal range of motion.  Cardiovascular: Normal rate, regular rhythm, normal heart sounds and intact distal pulses.   Pulmonary/Chest: Effort normal and breath sounds normal. No respiratory distress.  Abdominal: Soft. He  exhibits no distension. There is no tenderness.  Musculoskeletal: He exhibits tenderness.       Right knee: He exhibits decreased range of motion, swelling and effusion. He exhibits no ecchymosis, no deformity, no laceration, no erythema, no LCL laxity and no MCL laxity. tenderness found. Medial joint line and MCL tenderness noted.       Legs: Neurological: He is alert and oriented to person, place, and time.  Skin: Skin is warm and dry.  Psychiatric: He has a normal mood and affect. Judgment normal.    ED Course  Procedures (including critical care time)  Labs Reviewed - No data to display Dg Knee Complete 4 Views Right  11/08/2011  *RADIOLOGY REPORT*  Clinical Data: Pain post injury  RIGHT KNEE - COMPLETE 4+ VIEW  Comparison: 02/10/2011  Findings: Four views of the right knee submitted.  No acute fracture or subluxation.  Mild narrowing of medial joint compartment.  Question small joint effusion.  IMPRESSION: No acute fracture or subluxation.  Question small joint effusion.  Original Report Authenticated By: Natasha Mead, M.D.     No diagnosis found.    MDM          Worthy Rancher, PA 11/08/11 1351

## 2011-11-08 NOTE — Discharge Instructions (Signed)
Joint Sprain A sprain is a tear or stretch in the ligaments that hold a joint together. Severe sprains may need as long as 3-6 weeks of immobilization and/or exercises to heal completely. Sprained joints should be rested and protected. If not, they can become unstable and prone to re-injury. Proper treatment can reduce your pain, shorten the period of disability, and reduce the risk of repeated injuries. TREATMENT   Rest and elevate the injured joint to reduce pain and swelling.   Apply ice packs to the injury for 20-30 minutes every 2-3 hours for the next 2-3 days.   Keep the injury wrapped in a compression bandage or splint as long as the joint is painful or as instructed by your caregiver.   Do not use the injured joint until it is completely healed to prevent re-injury and chronic instability. Follow the instructions of your caregiver.   Long-term sprain management may require exercises and/or treatment by a physical therapist. Taping or special braces may help stabilize the joint until it is completely better.  SEEK MEDICAL CARE IF:   You develop increased pain or swelling of the joint.   You develop increasing redness and warmth of the joint.   You develop a fever.   It becomes stiff.   Your hand or foot gets cold or numb.  Document Released: 10/17/2004 Document Revised: 05/22/2011 Document Reviewed: 09/26/2008 [facility] Patient Information 2012 [place], [place].Knee Effusion  Knee effusion means you have fluid in your knee. The knee may be more difficult to bend and move. HOME CARE  Use crutches or a brace as told by your doctor.   Put ice on the injured area.   Put ice in a plastic bag.   Place a towel between your skin and the bag.   Leave the ice on for 15 to 20 minutes, 3 to 4 times a day.   Raise (elevate) your knee as much as possible.   Only take medicine as told by your doctor.   You may need to do strengthening exercises. Ask your doctor.   Continue with  your normal diet and activities as told by your doctor.  GET HELP RIGHT AWAY IF:  You have more puffiness (swelling) in your knee.   You see redness, puffiness, or have more pain in your knee.   You have a temperature by mouth above 102 F (38.9 C).   You get a rash.   You have trouble breathing.   You have a reaction to any medicine you are taking.   You have a lot of pain when you move your knee.  MAKE SURE YOU:  Understand these instructions.   Will watch your condition.   Will get help right away if you are not doing well or get worse.  Document Released: 10/12/2010 Document Revised: 05/22/2011 Document Reviewed: 10/12/2010 [facility] Patient Information 2012 [place], [place].Use of Crutches You have been prescribed crutches to take weight off one of your lower extremities (legs/feet). When using crutches, make sure you are not putting pressure on your armpits. This could cause damage to the nerves in the axilla (your armpit area) that extend to your hands and arms. When fitted properly the crutches should be two to three finger breadths below your axilla (armpit). Your weight should be supported by your hands, and not by resting upon the crutches with your armpits. When walking, first step with the crutches, then swing the healthy leg through and slightly ahead. When going up stairs, first step up with  the healthy leg and then follow with the crutches and injured leg up to the same step, and so forth. If there is a handrail, hold both crutches in one hand, place your other hand on the handrail, and while placing your weight on your arms, lift your good leg to the step, then bring the crutches and the injured leg up to that step. Repeat for each step. When going down stairs, first step with the injured leg and crutches, following down with the healthy leg to the same step. Be very careful, as going down stairs with crutches is very challenging. If you feel wobbly or nervous, sit down  and inch yourself down the stairs on your butt. To get up from a chair, hold injured leg forward, grab armrest with one hand and the top of the crutches with the other hand. Using these supports, pull yourself up to a standing position. Reverse this procedure for sitting. See your caregiver for follow up as suggested. If you are discharged in an ace wrap and develop numbness, tingling, swelling, or increased pain, loosen the ace wrap and re-wrap looser. If these problems persist, see your caregiver as needed. If you have been instructed to use partial weight bearing, bear (apply) the amount of weight as suggested by your caregiver. Do not bear weight in an amount that causes pain on the area of injury. Document Released: 09/06/2000 Document Revised: 05/22/2011 Document Reviewed: 11/14/2008 [facility] Patient Information 2012 [place], [place].Cryotherapy Cryotherapy means treatment with cold. Ice or gel packs can be used to reduce both pain and swelling. Ice is the most helpful within the first 24 to 48 hours after an injury or flareup from overusing a muscle or joint. Sprains, strains, spasms, burning pain, shooting pain, and aches can all be eased with ice. Ice can also be used when recovering from surgery. Ice is effective, has very few side effects, and is safe for most people to use. PRECAUTIONS  Ice is not a safe treatment option for people with:  Raynaud's phenomenon. This is a condition affecting small blood vessels in the extremities. Exposure to cold may cause your problems to return.   Cold hypersensitivity. There are many forms of cold hypersensitivity, including:   Cold urticaria. Red, itchy hives appear on the skin when the tissues begin to warm after being iced.   Cold erythema. This is a red, itchy rash caused by exposure to cold.   Cold hemoglobinuria. Red blood cells break down when the tissues begin to warm after being iced. The hemoglobin that carry oxygen are passed into the urine  because they cannot combine with blood proteins fast enough.   Numbness or altered sensitivity in the area being iced.  If you have any of the following conditions, do not use ice until you have discussed cryotherapy with your caregiver:  Heart conditions, such as arrhythmia, angina, or chronic heart disease.   High blood pressure.   Healing wounds or open skin in the area being iced.   Current infections.   Rheumatoid arthritis.   Poor circulation.   Diabetes.  Ice slows the blood flow in the region it is applied. This is beneficial when trying to stop inflamed tissues from spreading irritating chemicals to surrounding tissues. However, if you expose your skin to cold temperatures for too long or without the proper protection, you can damage your skin or nerves. Watch for signs of skin damage due to cold. HOME CARE INSTRUCTIONS Follow these tips to use ice and cold  packs safely.  Place a dry or damp towel between the ice and skin. A damp towel will cool the skin more quickly, so you may need to shorten the time that the ice is used.   For a more rapid response, add gentle compression to the ice.   Ice for no more than 10 to 20 minutes at a time. The bonier the area you are icing, the less time it will take to get the benefits of ice.   Check your skin after 5 minutes to make sure there are no signs of a poor response to cold or skin damage.   Rest 20 minutes or more in between uses.   Once your skin is numb, you can end your treatment. You can test numbness by very lightly touching your skin. The touch should be so light that you do not see the skin dimple from the pressure of your fingertip. When using ice, most people will feel these normal sensations in this order: cold, burning, aching, and numbness.   Do not use ice on someone who cannot communicate their responses to pain, such as small children or people with dementia.  HOW TO MAKE AN ICE PACK Ice packs are the most common  way to use ice therapy. Other methods include ice massage, ice baths, and cryo-sprays. Muscle creams that cause a cold, tingly feeling do not offer the same benefits that ice offers and should not be used as a substitute unless recommended by your caregiver. To make an ice pack, do one of the following:  Place crushed ice or a bag of frozen vegetables in a sealable plastic bag. Squeeze out the excess air. Place this bag inside another plastic bag. Slide the bag into a pillowcase or place a damp towel between your skin and the bag.   Mix 3 parts water with 1 part rubbing alcohol. Freeze the mixture in a sealable plastic bag. When you remove the mixture from the freezer, it will be slushy. Squeeze out the excess air. Place this bag inside another plastic bag. Slide the bag into a pillowcase or place a damp towel between your skin and the bag.  SEEK MEDICAL CARE IF:  You develop white spots on your skin. This may give the skin a blotchy (mottled) appearance.   Your skin turns blue or pale.   Your skin becomes waxy or hard.   Your swelling gets worse.  MAKE SURE YOU:   Understand these instructions.   Will watch your condition.   Will get help right away if you are not doing well or get worse.  Document Released: 05/06/2011 Document Reviewed: 05/02/2011 [facility] Patient Information 2012 [place], [place].   Wear the knee immobilizer for comfort and stability.  Use the crutches as needed and bear weight as tolerated.  Apply ice 20-30 min several times daily.  Take ibuprofen 800 mg every 8 hrs with food.  Follow up with dr. Romeo Apple

## 2011-11-08 NOTE — ED Notes (Signed)
Pt presents with right knee pain. Pt states he was walking in yard and "my knee buckled and I heard a pop". Injury occurred today. Pt ambulatory to triage.

## 2011-11-08 NOTE — ED Notes (Signed)
C/o pain to right knee

## 2011-11-09 NOTE — ED Provider Notes (Signed)
Evaluation and management procedures were performed by the PA/NP under my supervision/collaboration.   Pachia Strum D Dominic Rhome, MD 11/09/11 0818 

## 2012-01-03 ENCOUNTER — Emergency Department (HOSPITAL_COMMUNITY)
Admission: EM | Admit: 2012-01-03 | Discharge: 2012-01-03 | Disposition: A | Discharge status: Home / Self Care | Payer: Self-pay | Attending: Emergency Medicine | Admitting: Emergency Medicine

## 2012-01-03 ENCOUNTER — Emergency Department (HOSPITAL_COMMUNITY): Payer: Self-pay

## 2012-01-03 ENCOUNTER — Encounter (HOSPITAL_COMMUNITY): Payer: Self-pay | Admitting: *Deleted

## 2012-01-03 DIAGNOSIS — F17200 Nicotine dependence, unspecified, uncomplicated: Secondary | ICD-10-CM | POA: Insufficient documentation

## 2012-01-03 DIAGNOSIS — X500XXA Overexertion from strenuous movement or load, initial encounter: Secondary | ICD-10-CM | POA: Insufficient documentation

## 2012-01-03 DIAGNOSIS — M25473 Effusion, unspecified ankle: Secondary | ICD-10-CM | POA: Insufficient documentation

## 2012-01-03 DIAGNOSIS — M25569 Pain in unspecified knee: Secondary | ICD-10-CM | POA: Insufficient documentation

## 2012-01-03 DIAGNOSIS — M25476 Effusion, unspecified foot: Secondary | ICD-10-CM | POA: Insufficient documentation

## 2012-01-03 DIAGNOSIS — M25561 Pain in right knee: Secondary | ICD-10-CM

## 2012-01-03 IMAGING — CR DG KNEE COMPLETE 4+V*R*
4 series · 4 of 4 positions shown · non-contrast
Comparison: [DATE].

CLINICAL DATA: 45-year-old male with knee pain.

RIGHT KNEE - COMPLETE 4+ VIEW

[t knee ap right]
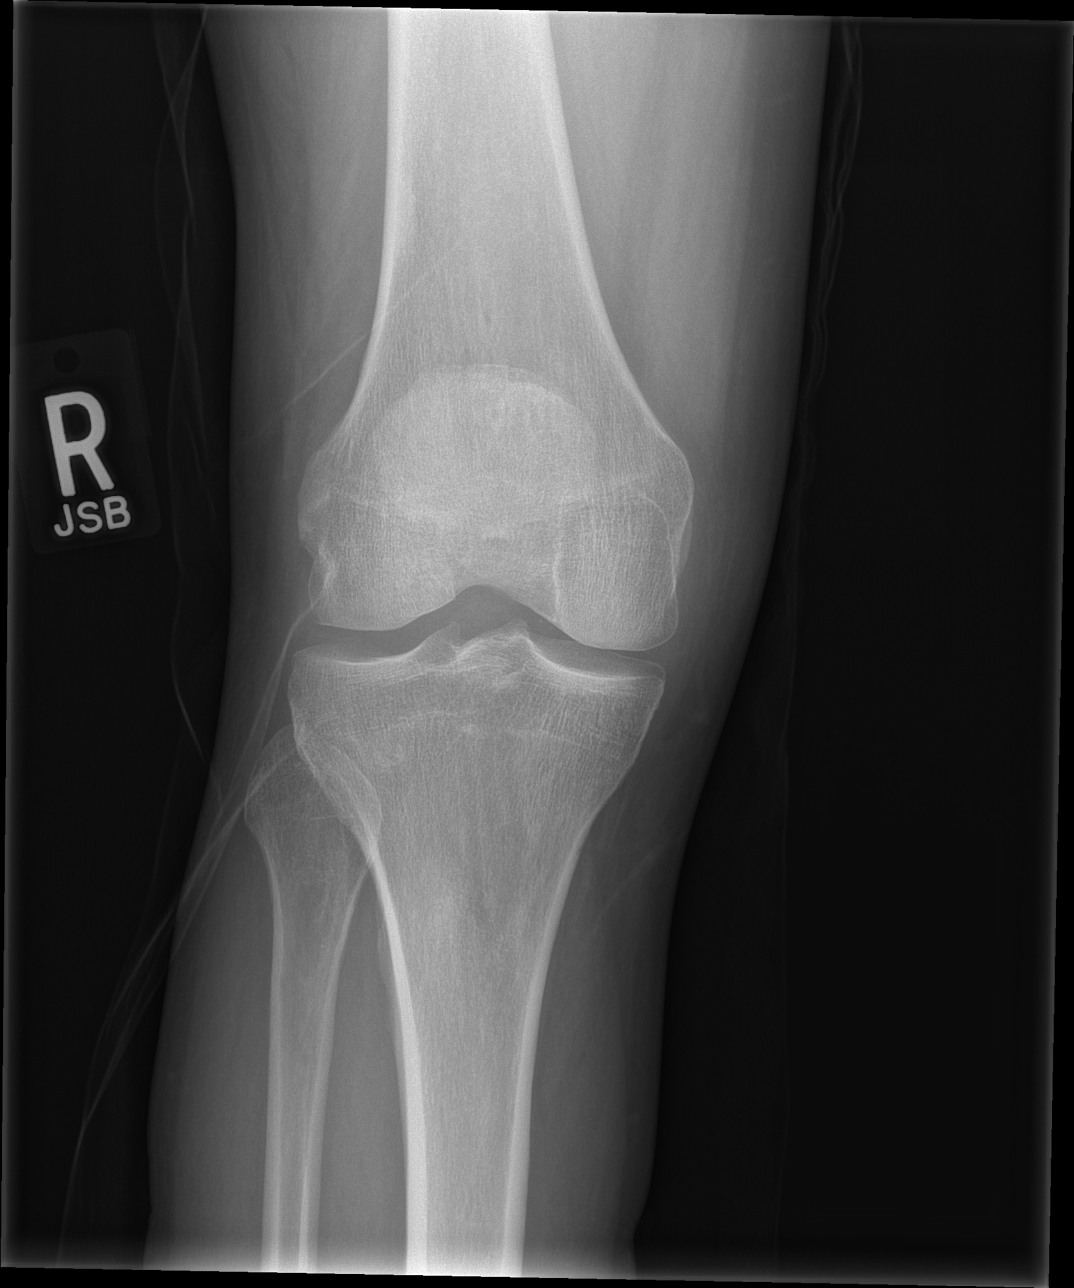

[t knee obl right (1 of 2)]
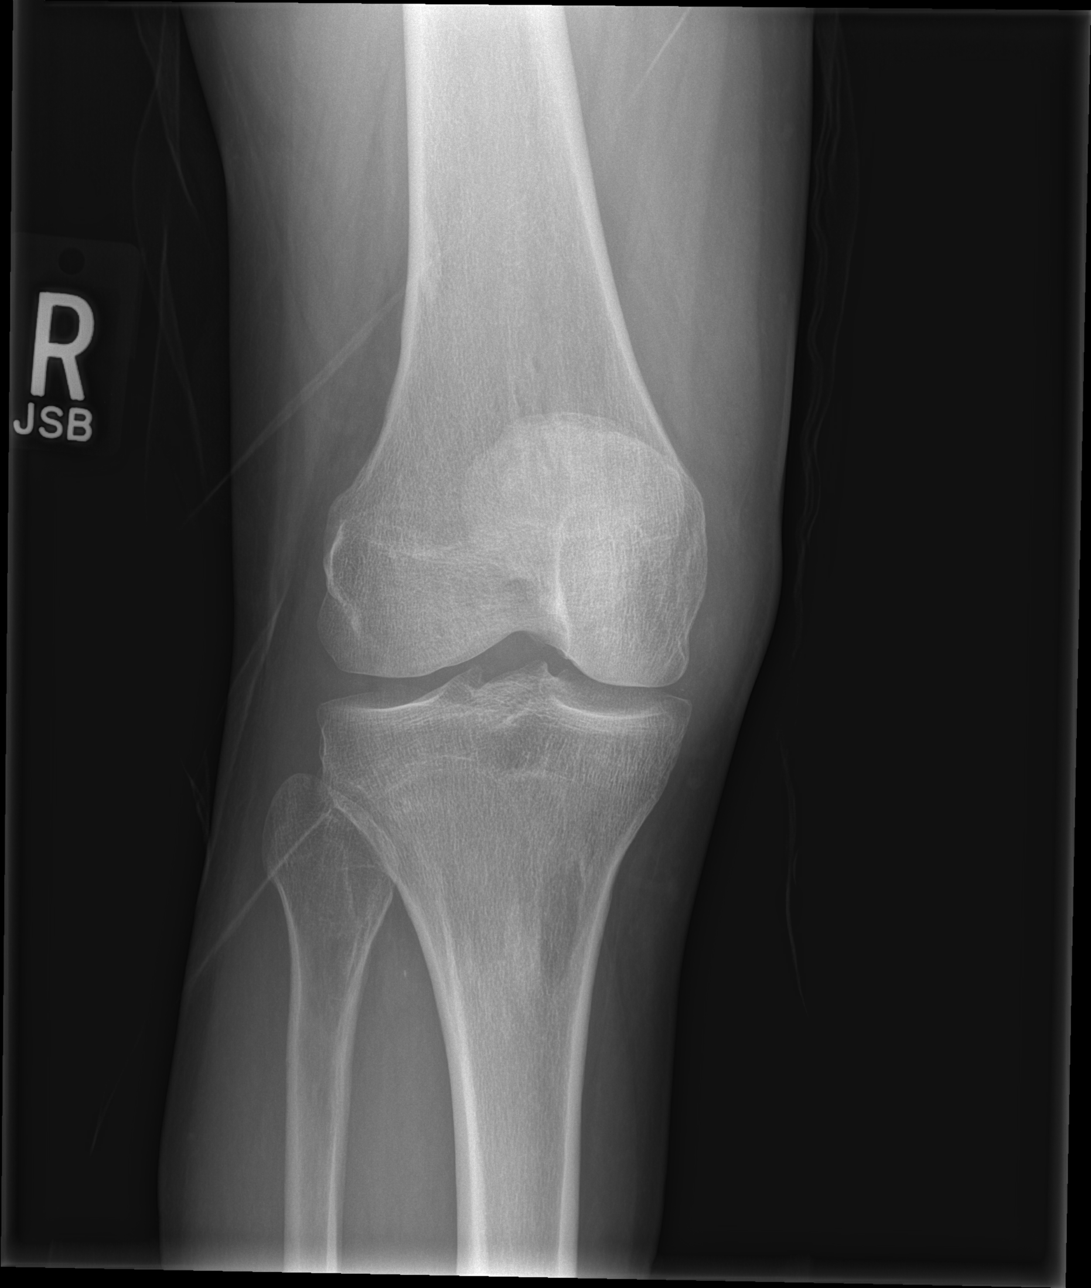

[t knee obl right (2 of 2)]
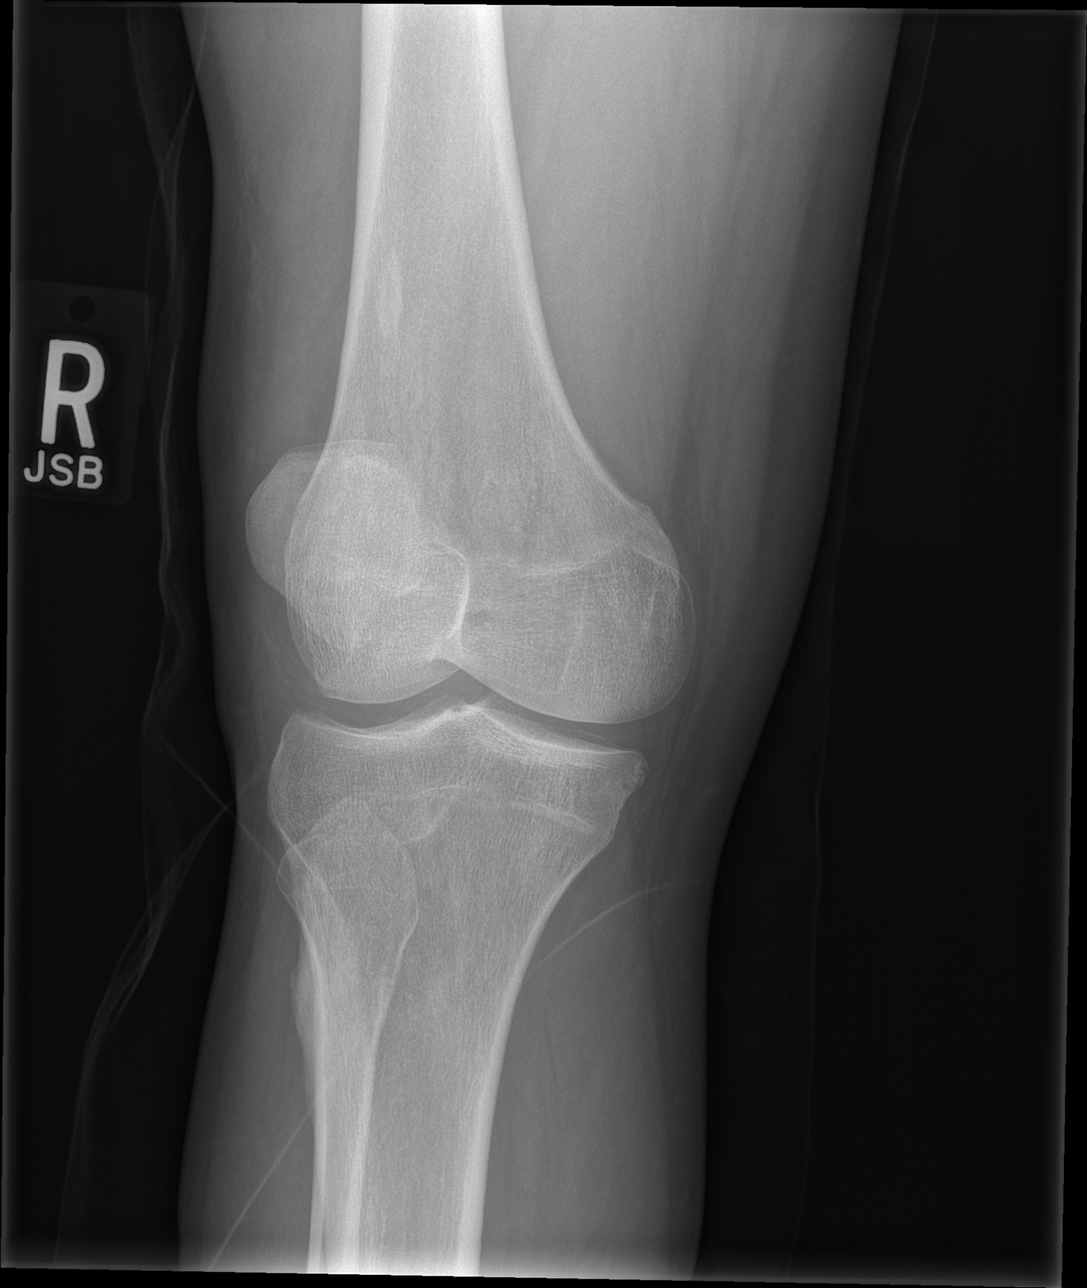

[t knee lat right]
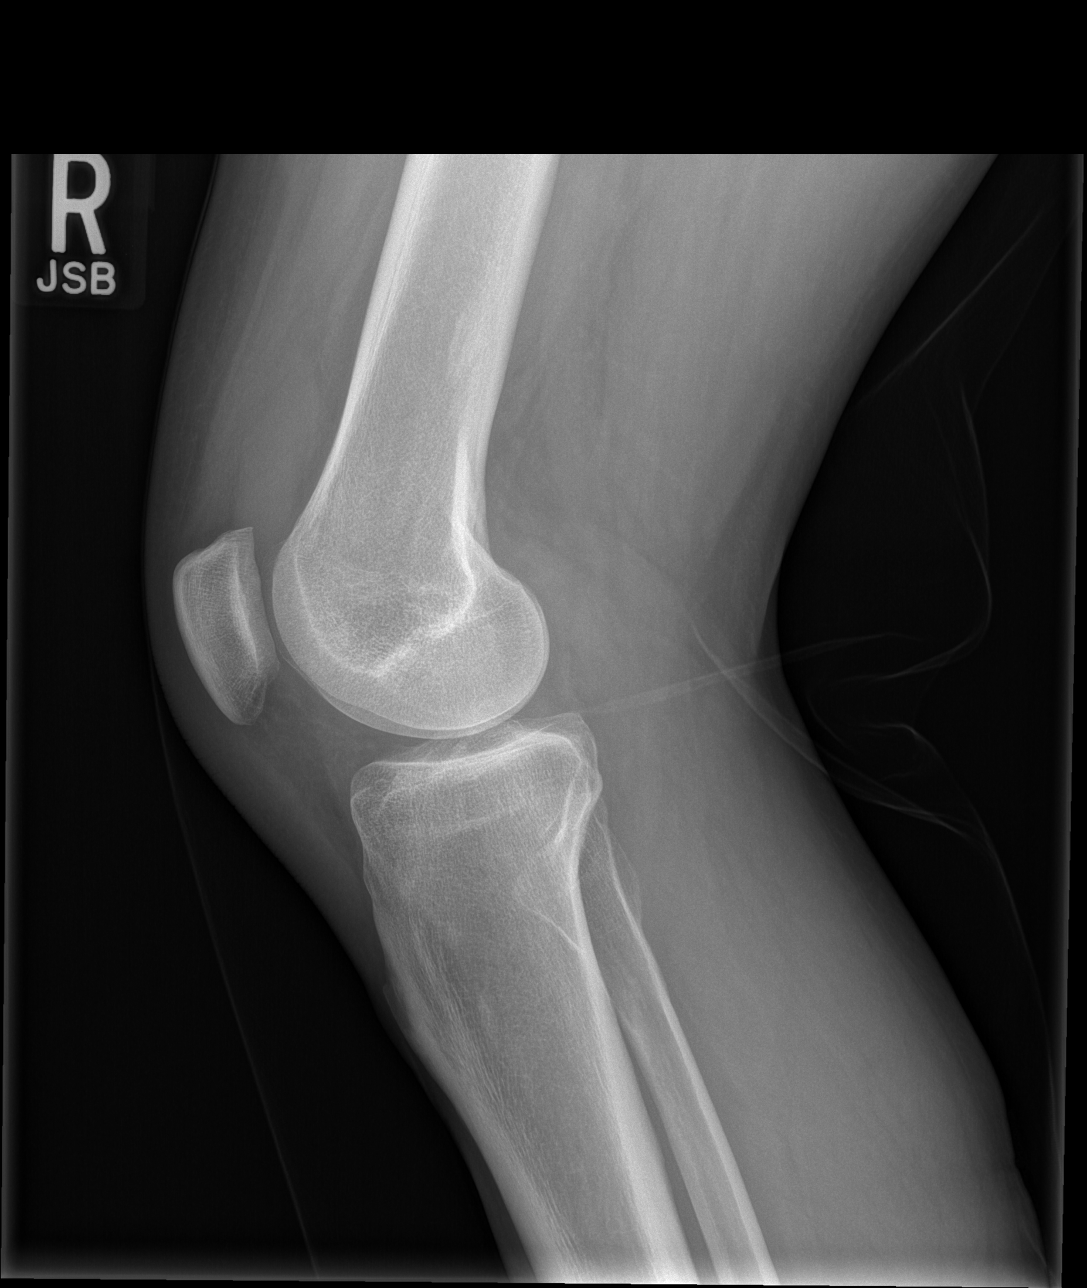

[4 of 4 positions shown; findings below may reference images not displayed]

FINDINGS: Persistent suprapatellar joint effusion.  Patella intact.
Mild stranding Hoffa's fat pad may be increased.  Joint spaces are
preserved.  No fracture or dislocation.
IMPRESSION: Persistent joint effusion.  Mild stranding Hoffa's fat pad.
Suspect internal derangement, no acute fracture or dislocation.

## 2012-01-03 MED ORDER — HYDROCODONE-ACETAMINOPHEN 5-500 MG PO TABS: 1.0000 | ORAL_TABLET | Freq: Four times a day (QID) | ORAL | Status: AC | PRN

## 2012-01-03 NOTE — ED Provider Notes (Signed)
Medical screening examination/treatment/procedure(s) were performed by non-physician practitioner and as supervising physician I was immediately available for consultation/collaboration.   Khristine Verno L Nakesha Ebrahim, MD 01/03/12 1809 

## 2012-01-03 NOTE — ED Provider Notes (Signed)
History     CSN: [id number]  Arrival date & time 01/03/12  1513   None     Chief Complaint  Patient presents with  . Knee Pain    (Consider location/radiation/quality/duration/timing/severity/associated sxs/prior treatment) Patient is a 46 y.o. male presenting with knee pain. The history is provided by the patient.  Knee Pain This is a new problem. The current episode started more than 1 month ago. The problem occurs constantly. The problem has been unchanged. Associated symptoms include arthralgias and joint swelling. The symptoms are aggravated by walking, standing and twisting. He has tried NSAIDs, immobilization, heat, ice and oral narcotics for the symptoms. The treatment provided no relief.  Pt states he twisted his knee about 2 mon ago. States pain in that knee since. States was seen at [facility] hospital, was given immobilizer, x-ray which showed effusion, and fu. Pt did not follow up because unable to pay the copay. Pain not improving. Taking ibuprofen with no relief.   History reviewed. No pertinent past medical history.  History reviewed. No pertinent past surgical history.  No family history on file.  History  Substance Use Topics  . Smoking status: Current Everyday Smoker -- 1.0 packs/day  . Smokeless tobacco: Not on file  . Alcohol Use: Yes     occasional      Review of Systems  Musculoskeletal: Positive for joint swelling and arthralgias.  All other systems reviewed and are negative.    Allergies  Review of patient's allergies indicates no known allergies.  Home Medications   Current Outpatient Rx  Name Route Sig Dispense Refill  . IBUPROFEN 800 MG PO TABS Oral Take 800 mg by mouth 2 (two) times daily as needed. For pain      BP 137/102  Pulse 79  Temp(Src) 98.2 F (36.8 C) (Oral)  Resp 16  SpO2 97%  Physical Exam  Nursing note and vitals reviewed. Constitutional: He is oriented to person, place, and time. He appears well-developed and  well-nourished. No distress.  Eyes: Conjunctivae are normal.  Neck: Neck supple.  Cardiovascular: Regular rhythm and normal heart sounds.   Pulmonary/Chest: Effort normal and breath sounds normal. No respiratory distress.  Musculoskeletal: He exhibits edema and tenderness.       There is swelling over right medial anterior knee. Tender to palpation in the medial joint space. Pain with ROM, but full ROM. Knee joint stable with negative anterior, posterior drawer signs, no laxity with medial or lateral stress.   Neurological: He is alert and oriented to person, place, and time. No cranial nerve deficit.  Skin: Skin is warm and dry.  Psychiatric: He has a normal mood and affect.    ED Course  Procedures (including critical care time)  Labs Reviewed - No data to display Dg Knee Complete 4 Views Right  01/03/2012  *RADIOLOGY REPORT*  Clinical Data: 46 year old male with knee pain.  RIGHT KNEE - COMPLETE 4+ VIEW  Comparison: 11/08/2011.  Findings: Persistent suprapatellar joint effusion.  Patella intact. Mild stranding Hoffa's fat pad may be increased.  Joint spaces are preserved.  No fracture or dislocation.  IMPRESSION: Persistent joint effusion.  Mild stranding Hoffa's fat pad. Suspect internal derangement, no acute fracture or dislocation.  Original Report Authenticated By: Harley Hallmark, M.D.     No diagnosis found.    MDM  Persistent pain post twisting injury. Joint stable. There is swelling and tenderness over right medial knee joint. Based on exam highly suspicious for a meniscal injury. Pt unable  to follow up. Will try another referral and suggested going to [place] to the teaching clinic. Will d/c home.         Lottie Mussel, PA 01/03/12 1625

## 2012-01-03 NOTE — Progress Notes (Signed)
Orthopedic Tech Progress Note Patient Details:  Clinton Ross 1966/03/24 [id number]  Other Ortho Devices Type of Ortho Device: Knee Sleeve Ortho Device Location: (R) LE Ortho Device Interventions: Application   Jennye Moccasin 01/03/2012, 4:54 PM

## 2012-01-03 NOTE — Discharge Instructions (Signed)
Your knee exam is highly suggestive of a meniscal injury. It is very important that you follow up with orthopedics specialist. Continue to keep you knee elevated. Ice it several times a day. Continue ibuprofen for pain and inflammation. Take vicodin as prescribed as needed for severe pain. You can try following up with the teaching orthopedics clinic at [facility].   Knee Pain The knee is the complex joint between your thigh and your lower leg. It is made up of bones, tendons, ligaments, and cartilage. The bones that make up the knee are:  The femur in the thigh.   The tibia and fibula in the lower leg.   The patella or kneecap riding in the groove on the lower femur.  CAUSES  Knee pain is a common complaint with many causes. A few of these causes are:  Injury, such as:   A ruptured ligament or tendon injury.   Torn cartilage.   Medical conditions, such as:   Gout   Arthritis   Infections   Overuse, over training or overdoing a physical activity.  Knee pain can be minor or severe. Knee pain can accompany debilitating injury. Minor knee problems often respond well to self-care measures or get well on their own. More serious injuries may need medical intervention or even surgery. SYMPTOMS The knee is complex. Symptoms of knee problems can vary widely. Some of the problems are:  Pain with movement and weight bearing.   Swelling and tenderness.   Buckling of the knee.   Inability to straighten or extend your knee.   Your knee locks and you cannot straighten it.   Warmth and redness with pain and fever.   Deformity or dislocation of the kneecap.  DIAGNOSIS  Determining what is wrong may be very straight forward such as when there is an injury. It can also be challenging because of the complexity of the knee. Tests to make a diagnosis may include:  Your caregiver taking a history and doing a physical exam.   Routine X-rays can be used to rule out other problems. X-rays  will not reveal a cartilage tear. Some injuries of the knee can be diagnosed by:   Arthroscopy a surgical technique by which a small video camera is inserted through tiny incisions on the sides of the knee. This procedure is used to examine and repair internal knee joint problems. Tiny instruments can be used during arthroscopy to repair the torn knee cartilage (meniscus).   Arthrography is a radiology technique. A contrast liquid is directly injected into the knee joint. Internal structures of the knee joint then become visible on X-ray film.   An MRI scan is a non x-ray radiology procedure in which magnetic fields and a computer produce two- or three-dimensional images of the inside of the knee. Cartilage tears are often visible using an MRI scanner. MRI scans have largely replaced arthrography in diagnosing cartilage tears of the knee.   Blood work.   Examination of the fluid that helps to lubricate the knee joint (synovial fluid). This is done by taking a sample out using a needle and a syringe.  TREATMENT The treatment of knee problems depends on the cause. Some of these treatments are:  Depending on the injury, proper casting, splinting, surgery or physical therapy care will be needed.   Give yourself adequate recovery time. Do not overuse your joints. If you begin to get sore during workout routines, back off. Slow down or do fewer repetitions.   For repetitive  activities such as cycling or running, maintain your strength and nutrition.   Alternate muscle groups. For example if you are a weight lifter, work the upper body on one day and the lower body the next.   Either tight or weak muscles do not give the proper support for your knee. Tight or weak muscles do not absorb the stress placed on the knee joint. Keep the muscles surrounding the knee strong.   Take care of mechanical problems.   If you have flat feet, orthotics or special shoes may help. See your caregiver if you need  help.   Arch supports, sometimes with wedges on the inner or outer aspect of the heel, can help. These can shift pressure away from the side of the knee most bothered by osteoarthritis.   A brace called an "unloader" brace also may be used to help ease the pressure on the most arthritic side of the knee.   If your caregiver has prescribed crutches, braces, wraps or ice, use as directed. The acronym for this is PRICE. This means protection, rest, ice, compression and elevation.   Nonsteroidal anti-inflammatory drugs (NSAID's), can help relieve pain. But if taken immediately after an injury, they may actually increase swelling. Take NSAID's with food in your stomach. Stop them if you develop stomach problems. Do not take these if you have a history of ulcers, stomach pain or bleeding from the bowel. Do not take without your caregiver's approval if you have problems with fluid retention, heart failure, or kidney problems.   For ongoing knee problems, physical therapy may be helpful.   Glucosamine and chondroitin are over-the-counter dietary supplements. Both may help relieve the pain of osteoarthritis in the knee. These medicines are different from the usual anti-inflammatory drugs. Glucosamine may decrease the rate of cartilage destruction.   Injections of a corticosteroid drug into your knee joint may help reduce the symptoms of an arthritis flare-up. They may provide pain relief that lasts a few months. You may have to wait a few months between injections. The injections do have a small increased risk of infection, water retention and elevated blood sugar levels.   Hyaluronic acid injected into damaged joints may ease pain and provide lubrication. These injections may work by reducing inflammation. A series of shots may give relief for as long as 6 months.   Topical painkillers. Applying certain ointments to your skin may help relieve the pain and stiffness of osteoarthritis. Ask your pharmacist  for suggestions. Many over the-counter products are approved for temporary relief of arthritis pain.   In some countries, doctors often prescribe topical NSAID's for relief of chronic conditions such as arthritis and tendinitis. A review of treatment with NSAID creams found that they worked as well as oral medications but without the serious side effects.  PREVENTION  Maintain a healthy weight. Extra pounds put more strain on your joints.   Get strong, stay limber. Weak muscles are a common cause of knee injuries. Stretching is important. Include flexibility exercises in your workouts.   Be smart about exercise. If you have osteoarthritis, chronic knee pain or recurring injuries, you may need to change the way you exercise. This does not mean you have to stop being active. If your knees ache after jogging or playing basketball, consider switching to swimming, water aerobics or other low-impact activities, at least for a few days a week. Sometimes limiting high-impact activities will provide relief.   Make sure your shoes fit well. Choose footwear that is  right for your sport.   Protect your knees. Use the proper gear for knee-sensitive activities. Use kneepads when playing volleyball or laying carpet. Buckle your seat belt every time you drive. Most shattered kneecaps occur in car accidents.   Rest when you are tired.  SEEK MEDICAL CARE IF:  You have knee pain that is continual and does not seem to be getting better.  SEEK IMMEDIATE MEDICAL CARE IF:  Your knee joint feels hot to the touch and you have a high fever. MAKE SURE YOU:   Understand these instructions.   Will watch your condition.   Will get help right away if you are not doing well or get worse.  Document Released: 07/07/2007 Document Revised: 08/29/2011 Document Reviewed: 07/07/2007 [facility] Patient Information 2012 [place], [place].

## 2012-01-03 NOTE — ED Notes (Signed)
He has had  Rt knee [ain after an injury one month ago.  He has had pain and swelling since then.  [facility].  Now he is saying it was 2 months ago.  He was referred out but did not follow up

## 2013-07-12 ENCOUNTER — Encounter (HOSPITAL_COMMUNITY): Payer: Self-pay | Admitting: Emergency Medicine

## 2013-07-12 ENCOUNTER — Emergency Department (HOSPITAL_COMMUNITY)
Admission: EM | Admit: 2013-07-12 | Discharge: 2013-07-12 | Disposition: A | Discharge status: Home / Self Care | Payer: Non-veteran care | Attending: Emergency Medicine | Admitting: Emergency Medicine

## 2013-07-12 DIAGNOSIS — F17200 Nicotine dependence, unspecified, uncomplicated: Secondary | ICD-10-CM | POA: Insufficient documentation

## 2013-07-12 DIAGNOSIS — M5430 Sciatica, unspecified side: Secondary | ICD-10-CM | POA: Insufficient documentation

## 2013-07-12 DIAGNOSIS — M5432 Sciatica, left side: Secondary | ICD-10-CM

## 2013-07-12 MED ORDER — PREDNISONE 10 MG PO TABS: ORAL_TABLET | ORAL | Status: DC

## 2013-07-12 MED ORDER — CYCLOBENZAPRINE HCL 10 MG PO TABS: 10.0000 mg | ORAL_TABLET | Freq: Three times a day (TID) | ORAL | Status: DC | PRN

## 2013-07-12 MED ORDER — OXYCODONE-ACETAMINOPHEN 5-325 MG PO TABS: 1.0000 | ORAL_TABLET | ORAL | Status: DC | PRN

## 2013-07-12 NOTE — ED Provider Notes (Signed)
CSN: [id number]     Arrival date & time 07/12/13  1427 History   First MD Initiated Contact with Patient 07/12/13 1445     Chief Complaint  Patient presents with  . Back Pain   (Consider location/radiation/quality/duration/timing/severity/associated sxs/prior Treatment) Patient is a 47 y.o. male presenting with back pain. The history is provided by the patient.  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 No pertinent past medical history. History reviewed. No pertinent past surgical history. Family History  Problem Relation Age of Onset  . Hypertension Mother   . Stroke Father   . Diabetes Other    History  Substance Use Topics  . Smoking status: Current Every Day Smoker -- 1.00 packs/day  . Smokeless tobacco: Not on file  . Alcohol Use: No     Comment: occasional    Review of Systems  Constitutional: Negative for fever.  Respiratory: Negative for shortness of breath.   Cardiovascular: Negative for chest pain.  Gastrointestinal: Negative for vomiting, abdominal pain, constipation and bowel incontinence.  Genitourinary: Negative for bladder incontinence,  dysuria, hematuria, flank pain, decreased urine volume, difficulty urinating and pelvic pain.       No perineal numbness or incontinence of urine or feces  Musculoskeletal: Positive for back pain. Negative for joint swelling.  Skin: Negative for rash.  Neurological: Negative for tingling, weakness, numbness, headaches and paresthesias.  All other systems reviewed and are negative.    Allergies  Review of patient's allergies indicates no known allergies.  Home Medications   Current Outpatient Rx  Name  Route  Sig  Dispense  Refill  . cyclobenzaprine (FLEXERIL) 10 MG tablet   Oral   Take 1 tablet (10 mg total) by mouth 3 (three) times daily as needed.   21 tablet   0   . ibuprofen (ADVIL,MOTRIN) 800 MG tablet   Oral   Take 800 mg by mouth 2 (two) times daily as needed. For pain         . oxyCODONE-acetaminophen (PERCOCET/ROXICET) 5-325 MG per tablet   Oral   Take 1 tablet by mouth every 4 (four) hours as needed for pain.   15 tablet   0   . predniSONE (DELTASONE) 10 MG tablet      Take 6 tablets day one, 5 tablets day two, 4 tablets day three, 3 tablets day four, 2 tablets day five, then 1 tablet day six   21 tablet   0    BP 152/96  Pulse 92  Temp(Src) 97.7 F (36.5 C) (Oral)  Resp 24  Ht 5\' 10"  (1.778 m)  Wt 190 lb (86.183 kg)  BMI 27.26 kg/m2  SpO2 98%  Physical Exam  Nursing note and vitals reviewed. Constitutional: He is oriented to person, place, and time. He appears well-developed and well-nourished. No distress.  HENT:  Head: Normocephalic and atraumatic.  Neck: Normal range of motion. Neck supple.  Cardiovascular: Normal rate, regular rhythm, normal heart sounds and intact distal pulses.   No murmur heard. Pulmonary/Chest: Effort normal and breath sounds normal. No respiratory distress.  Abdominal: Soft. He exhibits no distension. There is no tenderness.  Musculoskeletal: He exhibits tenderness. He exhibits no edema.       Lumbar back: He exhibits  tenderness and pain. He exhibits normal range of motion, no swelling, no deformity, no laceration and normal pulse.  ttp of the left lumbar spine and paraspinal muscles.  DP pulses are brisk and symmetrical.  Distal sensation intact.  Hip Flexors/Extensors are intact.  Pain is reproduced with SLR on left  Neurological: He is alert and oriented to person, place, and time. He has normal strength. No sensory deficit. He exhibits normal muscle tone. Coordination and gait normal.  Reflex Scores:      Patellar reflexes are 2+ on the right side and 2+ on the left side.      Achilles reflexes are 2+ on the right side and 2+ on the left side. Skin: Skin is warm and dry. No rash noted.    ED Course  Procedures (including critical care time) Labs Review Labs Reviewed - No data to display Imaging Review No results found.  EKG Interpretation   None       MDM   1. Sciatica, left     Patient reviewed on the [facility] narcotics database.  Patient has ttp of the left lower lumbar spine and paraspinal muscles.  No focal neuro deficits on exam.  Ambulates with a steady gait.   No concerning symptoms for emergent neurological or infectious process. Given hx of pain w/o trauma, no indication for imaging at this time  Pain likely related to a left-sided sciatica. Patient agrees to treatment with prednisone taper, Percocet, and Flexeril. Patient also given referral information for [facility]. Patient appears stable for discharge    Janzen Sacks L. Clarabell Matsuoka, PA-C 07/12/13 1524

## 2013-07-12 NOTE — ED Notes (Signed)
Pt c/o low back pain x 2 weeks. No known injury.

## 2013-07-20 NOTE — ED Provider Notes (Signed)
Medical screening examination/treatment/procedure(s) were performed by non-physician practitioner and as supervising physician I was immediately available for consultation/collaboration.  EKG Interpretation   None        Pranshu Lyster, MD 07/20/13 1626 

## 2013-10-24 DIAGNOSIS — K859 Acute pancreatitis without necrosis or infection, unspecified: Principal | ICD-10-CM

## 2013-10-24 HISTORY — DX: Acute pancreatitis without necrosis or infection, unspecified: K85.90

## 2013-11-20 ENCOUNTER — Inpatient Hospital Stay (HOSPITAL_COMMUNITY)
Admission: EM | Admit: 2013-11-20 | Discharge: 2013-11-23 | DRG: 440 | Disposition: A | Discharge status: Home / Self Care | Payer: Non-veteran care | Attending: Internal Medicine | Admitting: Internal Medicine

## 2013-11-20 ENCOUNTER — Emergency Department (HOSPITAL_COMMUNITY): Payer: Non-veteran care

## 2013-11-20 ENCOUNTER — Encounter (HOSPITAL_COMMUNITY): Payer: Self-pay | Admitting: Emergency Medicine

## 2013-11-20 DIAGNOSIS — F4310 Post-traumatic stress disorder, unspecified: Secondary | ICD-10-CM | POA: Diagnosis present

## 2013-11-20 DIAGNOSIS — I10 Essential (primary) hypertension: Secondary | ICD-10-CM

## 2013-11-20 DIAGNOSIS — M5430 Sciatica, unspecified side: Secondary | ICD-10-CM | POA: Diagnosis present

## 2013-11-20 DIAGNOSIS — Z833 Family history of diabetes mellitus: Secondary | ICD-10-CM

## 2013-11-20 DIAGNOSIS — K859 Acute pancreatitis without necrosis or infection, unspecified: Principal | ICD-10-CM

## 2013-11-20 DIAGNOSIS — Z823 Family history of stroke: Secondary | ICD-10-CM

## 2013-11-20 DIAGNOSIS — F17200 Nicotine dependence, unspecified, uncomplicated: Secondary | ICD-10-CM | POA: Diagnosis present

## 2013-11-20 DIAGNOSIS — Z8249 Family history of ischemic heart disease and other diseases of the circulatory system: Secondary | ICD-10-CM

## 2013-11-20 DIAGNOSIS — K5900 Constipation, unspecified: Secondary | ICD-10-CM

## 2013-11-20 DIAGNOSIS — K8020 Calculus of gallbladder without cholecystitis without obstruction: Secondary | ICD-10-CM | POA: Diagnosis present

## 2013-11-20 HISTORY — DX: Essential (primary) hypertension: I10

## 2013-11-20 HISTORY — DX: Reserved for concepts with insufficient information to code with codable children: IMO0002

## 2013-11-20 LAB — COMPREHENSIVE METABOLIC PANEL
ALBUMIN: 3.8 g/dL (ref 3.5–5.2)
ALT: 19 U/L (ref 0–53)
AST: 17 U/L (ref 0–37)
Alkaline Phosphatase: 115 U/L (ref 39–117)
BUN: 18 mg/dL (ref 6–23)
CHLORIDE: 98 meq/L (ref 96–112)
CO2: 26 meq/L (ref 19–32)
CREATININE: 0.95 mg/dL (ref 0.50–1.35)
Calcium: 10 mg/dL (ref 8.4–10.5)
GFR calc Af Amer: >90 mL/min (ref >90)
GFR calc non Af Amer: >90 mL/min (ref >90)
GLUCOSE: 114 mg/dL — AB (ref 70–99)
Potassium: 4.4 mEq/L (ref 3.7–5.3)
Sodium: 137 mEq/L (ref 137–147)
Total Bilirubin: 0.3 mg/dL (ref 0.3–1.2)
Total Protein: 8 g/dL (ref 6.0–8.3)

## 2013-11-20 LAB — URINALYSIS, ROUTINE W REFLEX MICROSCOPIC
BILIRUBIN URINE: NEGATIVE
GLUCOSE, UA: NEGATIVE mg/dL
KETONES UR: NEGATIVE mg/dL
LEUKOCYTES UA: NEGATIVE
NITRITE: NEGATIVE
PH: 5.5 (ref 5.0–8.0)
PROTEIN: NEGATIVE mg/dL
Specific Gravity, Urine: >1.03 — ABNORMAL HIGH (ref 1.005–1.030)
Urobilinogen, UA: 0.2 mg/dL (ref 0.0–1.0)

## 2013-11-20 LAB — CBC WITH DIFFERENTIAL/PLATELET
BASOS ABS: 0 10*3/uL (ref 0.0–0.1)
BASOS PCT: 0 % (ref 0–1)
Eosinophils Absolute: 0.2 10*3/uL (ref 0.0–0.7)
Eosinophils Relative: 2 % (ref 0–5)
HCT: 52.3 % — ABNORMAL HIGH (ref 39.0–52.0)
Hemoglobin: 18.9 g/dL — ABNORMAL HIGH (ref 13.0–17.0)
LYMPHS ABS: 2.2 10*3/uL (ref 0.7–4.0)
LYMPHS PCT: 18 % (ref 12–46)
MCH: 30.2 pg (ref 26.0–34.0)
MCHC: 36.1 g/dL — ABNORMAL HIGH (ref 30.0–36.0)
MCV: 83.7 fL (ref 78.0–100.0)
MONOS PCT: 8 % (ref 3–12)
Monocytes Absolute: 1 10*3/uL (ref 0.1–1.0)
NEUTROS ABS: 8.8 10*3/uL — AB (ref 1.7–7.7)
NEUTROS PCT: 72 % (ref 43–77)
PLATELETS: 228 10*3/uL (ref 150–400)
RBC: 6.25 MIL/uL — ABNORMAL HIGH (ref 4.22–5.81)
RDW: 12.8 % (ref 11.5–15.5)
WBC: 12.2 10*3/uL — ABNORMAL HIGH (ref 4.0–10.5)

## 2013-11-20 LAB — URINE MICROSCOPIC-ADD ON

## 2013-11-20 LAB — LIPASE, BLOOD: LIPASE: 75 U/L — AB (ref 11–59)

## 2013-11-20 LAB — TRIGLYCERIDES: Triglycerides: 170 mg/dL — ABNORMAL HIGH (ref <150)

## 2013-11-20 IMAGING — CT CT ABD-PELV W/ CM
2 of 4 series · 16 of 46 positions shown, 18 images · IV contrast (Omnipaque 300)
Comparison: None.

CLINICAL DATA: Abdominal pain for of the last several days. Dual
bowel movement since pain began.

EXAM:
CT ABDOMEN AND PELVIS WITH CONTRAST
TECHNIQUE: Multidetector CT imaging of the abdomen and pelvis was performed
using the standard protocol following bolus administration of
intravenous contrast.
CONTRAST:  50mL OMNIPAQUE IOHEXOL 300 MG/ML SOLN, 100mL OMNIPAQUE
IOHEXOL 300 MG/ML SOLN

[Series 2: abd_pel_with 5.0 b40f · axial · 0.70mm/px · z∈[+442,+886]mm · 13 of 99 slices shown, 15 images]
[im 5/99  soft-tissue]
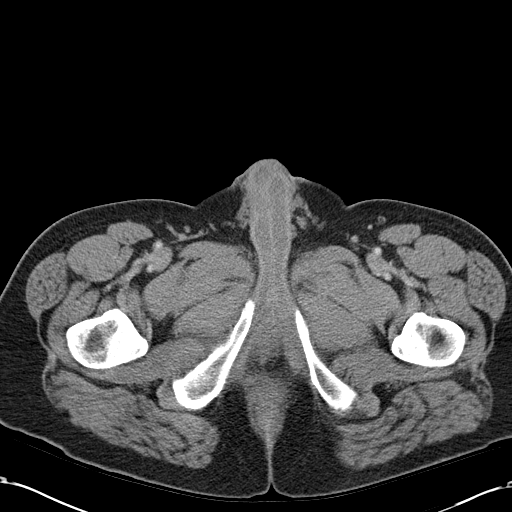
[im 5/99  bone]
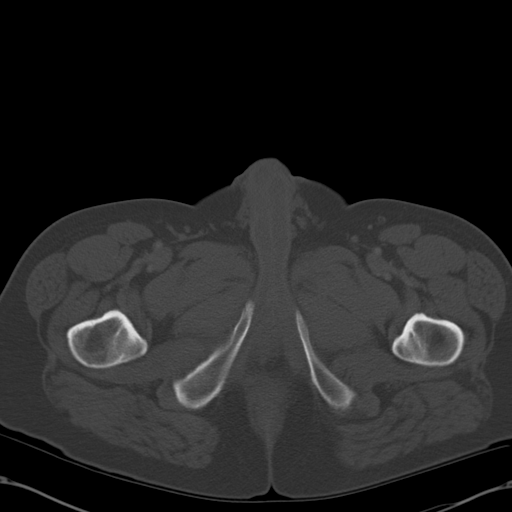
[im 15/99  soft-tissue]
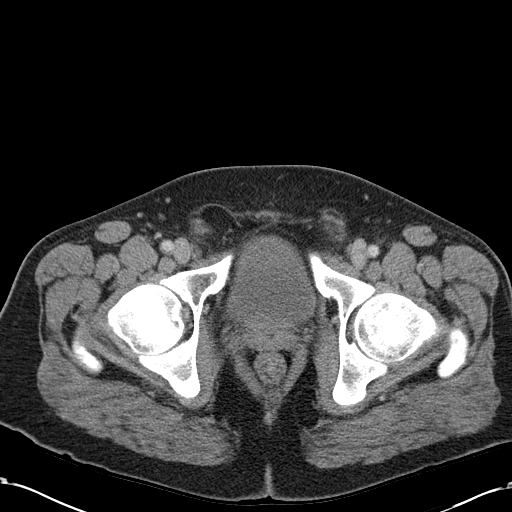
[im 20/99  soft-tissue]
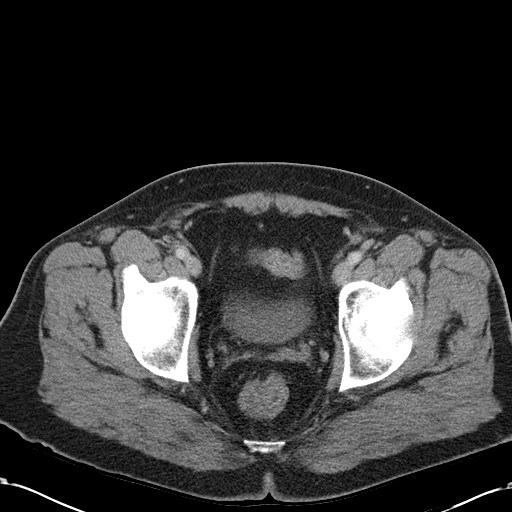
[im 30/99  soft-tissue]
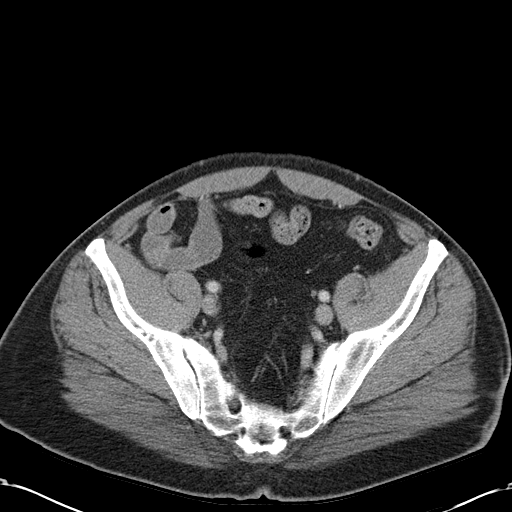
[im 35/99  soft-tissue]
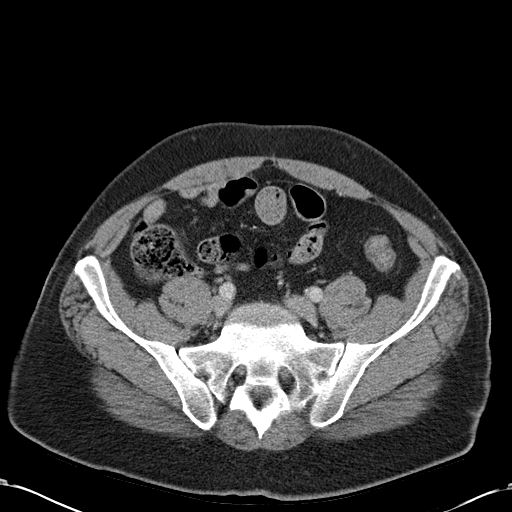
[im 45/99  soft-tissue]
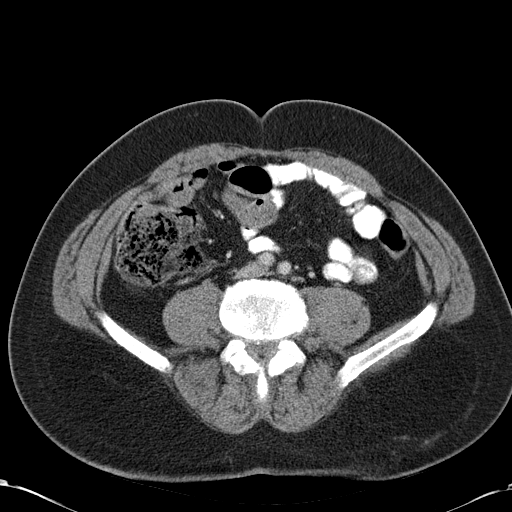
[im 50/99  soft-tissue]
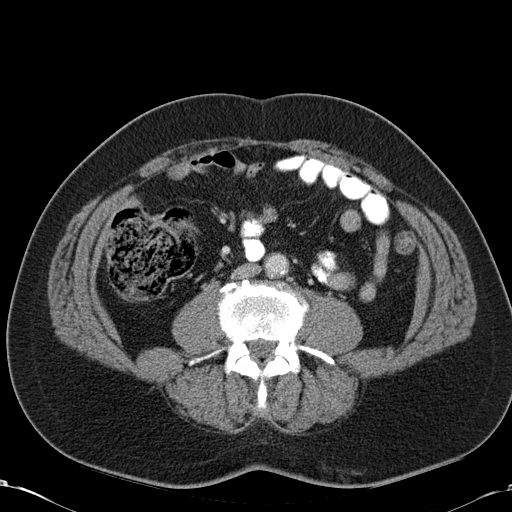
[im 54/99  soft-tissue]
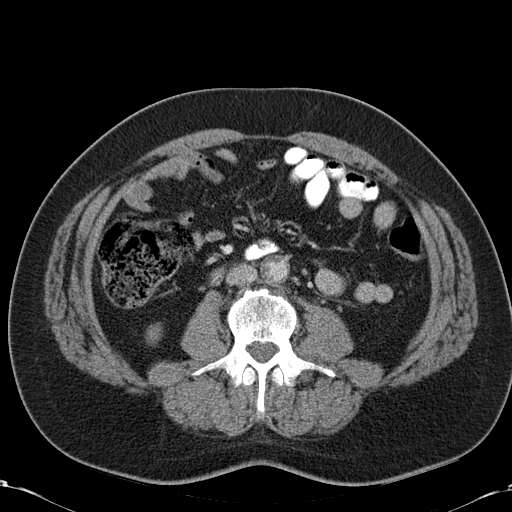
[im 64/99  soft-tissue]
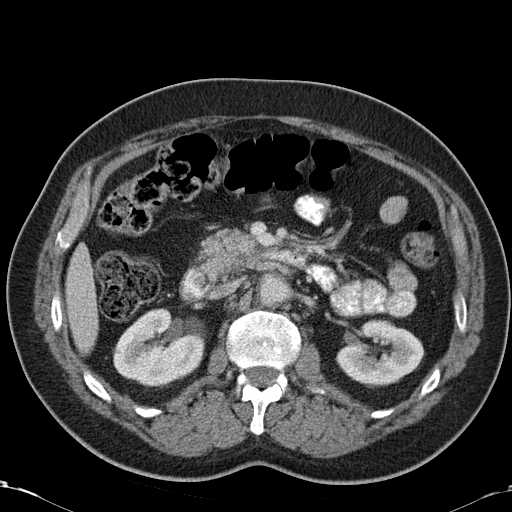
[im 64/99  bone]
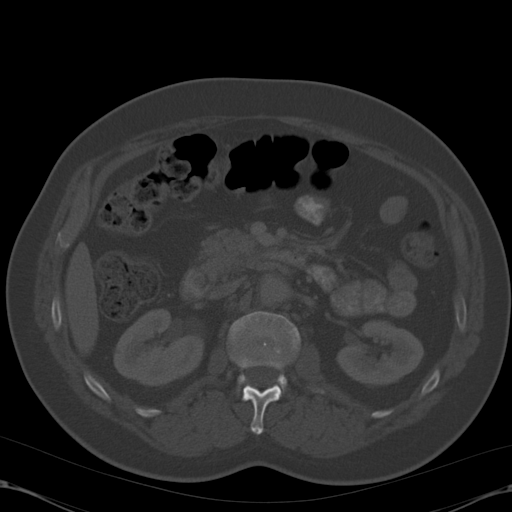
[im 69/99  soft-tissue]
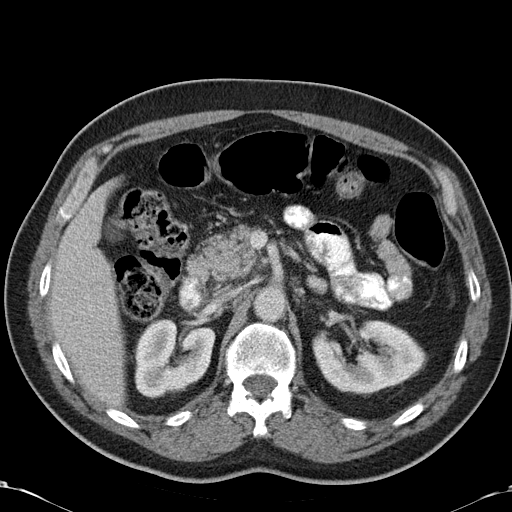
[im 79/99  soft-tissue]
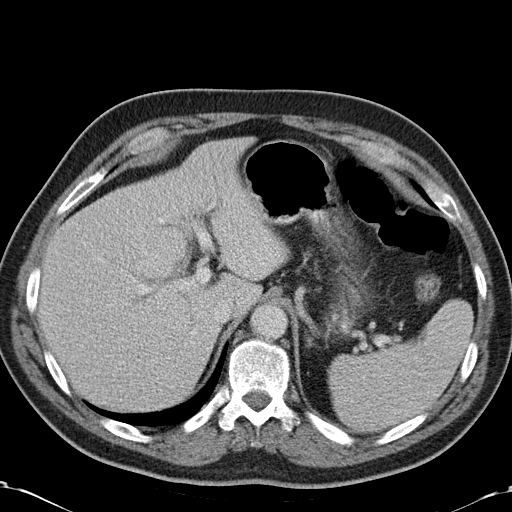
[im 84/99  soft-tissue]
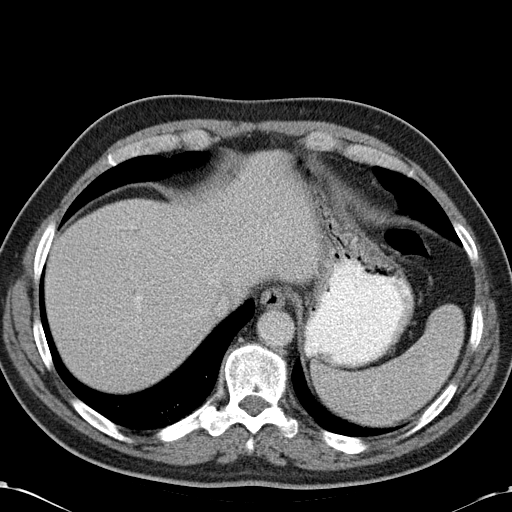
[im 94/99  soft-tissue]
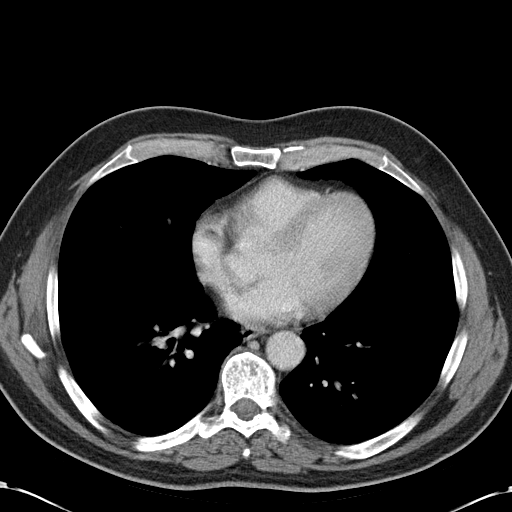

[Series 4: abd_pel_with 3.0 spo cor · coronal · 0.79mm/px · 3 of 106 slices shown]
[im 36/106  soft-tissue]
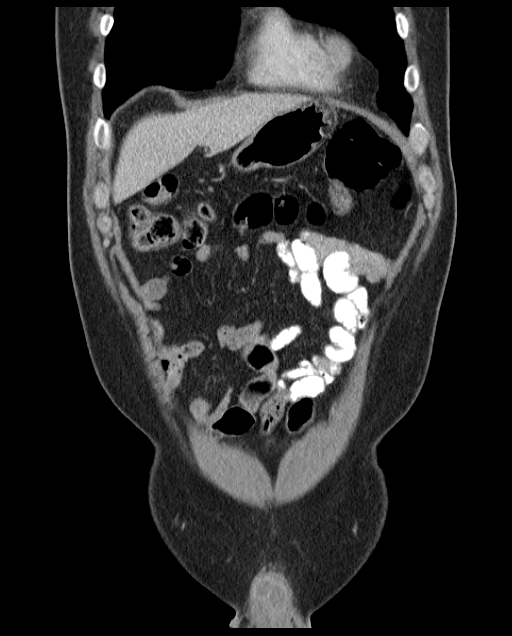
[im 47/106  soft-tissue]
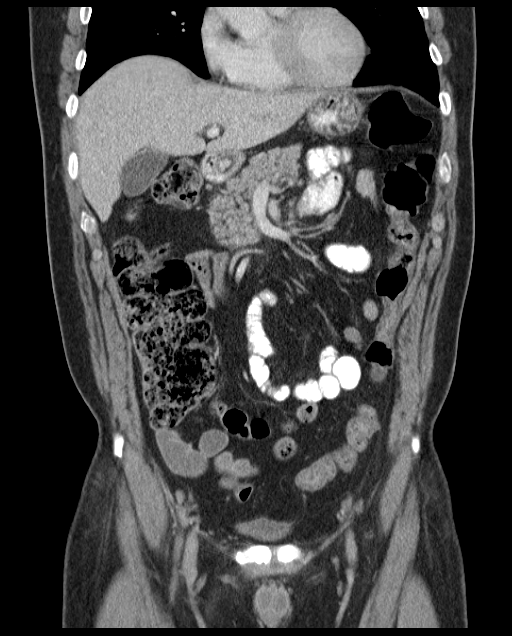
[im 59/106  soft-tissue]
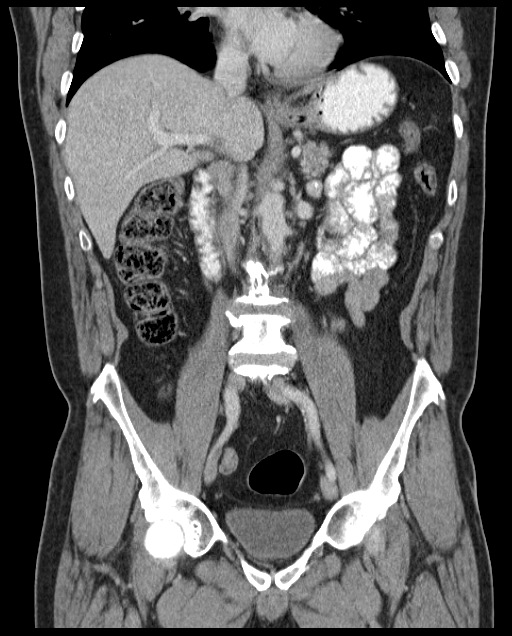

[16 of 46 positions shown; findings below may reference images not displayed]

FINDINGS: The margins of the pancreatic head are mildly ill-defined and there
is subtle stranding in the adjacent fat. There are prominent
peripancreatic and gastrohepatic ligament lymph nodes. The largest
is a posterior gastrohepatic ligament lymph node measuring 13 mm in
short axis. There is a more dense oval mass or node just inferior to
the tail the pancreas measuring 13 mm in short axis. There is no
peripancreatic fluid. The pancreas enhances diffusely. The portal
vein, splenic vein and superior mesenteric vein are widely patent.

Lung bases show minimal subsegmental atelectasis but are otherwise
clear. The heart is normal in size.

Normal liver and spleen. Gallbladder is unremarkable. No dilation of
the common bile duct. No adrenal masses.

2.6 cm cyst arises from the medial right kidney. Kidneys otherwise
unremarkable. Normal ureters. Normal bladder.

No other adenopathy. No ascites. There are atherosclerotic changes
along the abdominal aorta. No aneurysm.

There is mild increased stool burden in the right colon. There is no
bowel wall thickening or mesenteric inflammation. The small bowel is
unremarkable. A normal appendix is visualized.

There are degenerative changes throughout the visualized spine. No
osteoblastic or osteolytic lesions.
IMPRESSION: 1. Findings suggest mild pancreatitis if this correlates clinically.
The appearance of the pancreas and adjacent mild peripancreatic and
gastrohepatic ligament adenopathy may all be chronic, however.
2. Mild increased stool burden in the right colon. No evidence of
bowel obstruction or bowel inflammation. Normal appendix is
visualized.
3. Right renal cyst.  Degenerative changes of the visualized spine.
4. No other abnormalities.

## 2013-11-20 MED ORDER — ALBUTEROL SULFATE (2.5 MG/3ML) 0.083% IN NEBU: 2.5000 mg | INHALATION_SOLUTION | RESPIRATORY_TRACT | Status: DC | PRN

## 2013-11-20 MED ORDER — ONDANSETRON HCL 4 MG/2ML IJ SOLN: 4.0000 mg | Freq: Four times a day (QID) | INTRAMUSCULAR | Status: DC | PRN

## 2013-11-20 MED ORDER — HYDROMORPHONE HCL PF 1 MG/ML IJ SOLN
1.0000 mg | INTRAMUSCULAR | Status: DC | PRN
  Administered 2013-11-20 – 2013-11-21 (×2): 1 mg via INTRAVENOUS
  Filled 2013-11-20 (×2): qty 1

## 2013-11-20 MED ORDER — SODIUM CHLORIDE 0.9 % IV BOLUS (SEPSIS)
1000.0000 mL | Freq: Once | INTRAVENOUS | Status: AC
  Administered 2013-11-20: 1000 mL via INTRAVENOUS

## 2013-11-20 MED ORDER — ACETAMINOPHEN 650 MG RE SUPP: 650.0000 mg | Freq: Four times a day (QID) | RECTAL | Status: DC | PRN

## 2013-11-20 MED ORDER — ENOXAPARIN SODIUM 40 MG/0.4ML ~~LOC~~ SOLN
40.0000 mg | SUBCUTANEOUS | Status: DC
  Administered 2013-11-20: 40 mg via SUBCUTANEOUS
  Filled 2013-11-20 (×2): qty 0.4

## 2013-11-20 MED ORDER — DOCUSATE SODIUM 100 MG PO CAPS
100.0000 mg | ORAL_CAPSULE | Freq: Two times a day (BID) | ORAL | Status: DC
  Administered 2013-11-20 – 2013-11-23 (×6): 100 mg via ORAL
  Filled 2013-11-20 (×6): qty 1

## 2013-11-20 MED ORDER — ONDANSETRON HCL 4 MG/2ML IJ SOLN
4.0000 mg | Freq: Once | INTRAMUSCULAR | Status: AC
  Administered 2013-11-20: 4 mg via INTRAVENOUS
  Filled 2013-11-20: qty 2

## 2013-11-20 MED ORDER — HYDROMORPHONE HCL PF 1 MG/ML IJ SOLN
1.0000 mg | Freq: Once | INTRAMUSCULAR | Status: AC
  Administered 2013-11-20: 1 mg via INTRAVENOUS
  Filled 2013-11-20: qty 1

## 2013-11-20 MED ORDER — IOHEXOL 300 MG/ML  SOLN
50.0000 mL | Freq: Once | INTRAMUSCULAR | Status: AC | PRN
  Administered 2013-11-20: 50 mL via ORAL

## 2013-11-20 MED ORDER — IOHEXOL 300 MG/ML  SOLN
100.0000 mL | Freq: Once | INTRAMUSCULAR | Status: AC | PRN
  Administered 2013-11-20: 100 mL via INTRAVENOUS

## 2013-11-20 MED ORDER — ACETAMINOPHEN 325 MG PO TABS: 650.0000 mg | ORAL_TABLET | Freq: Four times a day (QID) | ORAL | Status: DC | PRN

## 2013-11-20 MED ORDER — ONDANSETRON HCL 4 MG PO TABS
4.0000 mg | ORAL_TABLET | Freq: Four times a day (QID) | ORAL | Status: DC | PRN
  Administered 2013-11-21: 4 mg via ORAL
  Filled 2013-11-20: qty 1

## 2013-11-20 MED ORDER — HYDRALAZINE HCL 20 MG/ML IJ SOLN
10.0000 mg | Freq: Four times a day (QID) | INTRAMUSCULAR | Status: DC | PRN
Start: 2013-11-20 — End: 2013-11-23

## 2013-11-20 MED ORDER — HYDROCODONE-ACETAMINOPHEN 5-325 MG PO TABS
1.0000 | ORAL_TABLET | ORAL | Status: DC | PRN
  Administered 2013-11-20: 1 via ORAL
  Administered 2013-11-21 – 2013-11-22 (×6): 2 via ORAL
  Administered 2013-11-22 (×2): 1 via ORAL
  Administered 2013-11-22: 2 via ORAL
  Administered 2013-11-22: 1 via ORAL
  Administered 2013-11-23: 2 via ORAL
  Filled 2013-11-20: qty 2
  Filled 2013-11-20: qty 1
  Filled 2013-11-20: qty 2
  Filled 2013-11-20: qty 1
  Filled 2013-11-20 (×5): qty 2
  Filled 2013-11-20: qty 1
  Filled 2013-11-20 (×2): qty 2

## 2013-11-20 MED ORDER — POLYETHYLENE GLYCOL 3350 17 G PO PACK
17.0000 g | PACK | Freq: Every day | ORAL | Status: DC
Start: 2013-11-21 — End: 2013-11-23
  Administered 2013-11-21 – 2013-11-23 (×3): 17 g via ORAL
  Filled 2013-11-20 (×3): qty 1

## 2013-11-20 MED ORDER — SODIUM CHLORIDE 0.9 % IV SOLN
INTRAVENOUS | Status: DC
  Administered 2013-11-20 – 2013-11-21 (×3): via INTRAVENOUS

## 2013-11-20 NOTE — H&P (Signed)
[facility] History and Physical  Clinton Ross CLE:[id number] DOB: May 08, 1966 DOA: 11/20/2013   PCP: Dr. Ace Gins at [facility] Specialists: None  Chief Complaint: Abdominal pain since Tuesday along with constipation  HPI: Clinton Ross is a 48 y.o. male with a past medical history of posttraumatic stress disorder, chronic back pain with neuropathy, hypertension, who was in his usual state of health till this past Tuesday, when he started getting constipated. He hasn't had a bowel movement since then. And, then he started having abdominal pain in the upper abdomen radiating to the back and to the right. He went to see his primary care physician on Wednesday, and they were planning to schedule an US Abdomen but he hasn't had one yet. The pain got severe to 10 out of 10 in intensity. It was a stabbing pain and continuous. He was briefly helped by a heating pad. However, his symptoms got worse and so, he decided to come in to the hospital. He has been nauseous, but denies any vomiting. No fever. No chills. He's never had similar symptoms before. He's not had a colonoscopy yet. He was recently started on Depakote on Sunday for posttraumatic stress disorder. He took 3 doses of this medication and was asked to stop taking it by his primary care physician. His other home medications were started in December. After receiving pain medications in the emergency department patient is feeling some better.  Home Medications: Prior to Admission medications   Medication Sig Start Date End Date Taking? Authorizing Provider  etodolac (LODINE) 500 MG tablet Take 500 mg by mouth 2 (two) times daily.   Yes Historical Provider, MD  lisinopril (PRINIVIL,ZESTRIL) 40 MG tablet Take 20 mg by mouth daily.   Yes Historical Provider, MD  traMADol (ULTRAM) 50 MG tablet Take 50 mg by mouth every 6 (six) hours as needed. pain   Yes Historical Provider, MD    Allergies: No Known Allergies  Past Medical History: Past  Medical History  Diagnosis Date  . Hypertension   . Bulging disc     History reviewed. No pertinent past surgical history.  Social History: Lives in [place] with his wife. Smokes one pack of cigarettes on a daily basis. No alcohol use. No illicit drug use. Independent with daily activities.  Family History:  Family History  Problem Relation Age of Onset  . Hypertension Mother   . Stroke Father   . Diabetes Other      Review of Systems - History obtained from the patient General ROS: positive for  - fatigue Psychological ROS: negative Ophthalmic ROS: negative ENT ROS: negative Allergy and Immunology ROS: negative Hematological and Lymphatic ROS: negative Endocrine ROS: negative Respiratory ROS: no cough, shortness of breath, or wheezing Cardiovascular ROS: no chest pain or dyspnea on exertion Gastrointestinal ROS: as in hpi Genito-Urinary ROS: no dysuria, trouble voiding, or hematuria Musculoskeletal ROS: negative Neurological ROS: no TIA or stroke symptoms Dermatological ROS: negative  Physical Examination  Filed Vitals:   11/20/13 1813 11/20/13 2102  BP: 134/102 131/94  Pulse: 107 75  Temp: 97.5 F (36.4 C)   TempSrc: Oral   Resp: 20 20  Height: $Remove'5\' 10"'JSJswqt$  (1.778 m)   Weight: 89.812 kg (198 lb)   SpO2: 94% 96%    BP 131/94  Pulse 75  Temp(Src) 97.5 F (36.4 C) (Oral)  Resp 20  Ht $R'5\' 10"'XT$  (1.778 m)  Wt 89.812 kg (198 lb)  BMI 28.41 kg/m2  SpO2 96%  General appearance: alert, cooperative,  appears stated age and no distress Head: Normocephalic, without obvious abnormality, atraumatic Eyes: conjunctivae/corneas clear. PERRL, EOM's intact. Throat: lips, mucosa, and tongue normal; teeth and gums normal Neck: no adenopathy, no carotid bruit, no JVD, supple, symmetrical, trachea midline and thyroid not enlarged, symmetric, no tenderness/mass/nodules Resp: clear to auscultation bilaterally Cardio: regular rate and rhythm, S1, S2 normal, no murmur, click, rub or  gallop GI: Abdomen is soft. Tenderness is present mainly in the middle to lower part of the abdomen bilaterally. Minimal epigastric and right upper quadrant tenderness. Murphy sign was equivocal. No rebound, rigidity, or guarding. Bowel sounds sluggish. No masses, or organomegaly Extremities: extremities normal, atraumatic, no cyanosis or edema Pulses: 2+ and symmetric Skin: Skin color, texture, turgor normal. No rashes or lesions Lymph nodes: Cervical, supraclavicular, and axillary nodes normal. Neurologic: Alert and oriented x3. Cranial nerves intact. No motor strength deficit. Bilateral upper and lower extremities. No focal deficits overall.  Laboratory Data: Results for orders placed during the hospital encounter of 11/20/13 (from the past 48 hour(s))  CBC WITH DIFFERENTIAL     Status: Abnormal   Collection Time    11/20/13  6:20 PM      Result Value Ref Range   WBC 12.2 (*) 4.0 - 10.5 K/uL   RBC 6.25 (*) 4.22 - 5.81 MIL/uL   Hemoglobin 18.9 (*) 13.0 - 17.0 g/dL   HCT 52.3 (*) 39.0 - 52.0 %   MCV 83.7  78.0 - 100.0 fL   MCH 30.2  26.0 - 34.0 pg   MCHC 36.1 (*) 30.0 - 36.0 g/dL   RDW 12.8  11.5 - 15.5 %   Platelets 228  150 - 400 K/uL   Neutrophils Relative % 72  43 - 77 %   Neutro Abs 8.8 (*) 1.7 - 7.7 K/uL   Lymphocytes Relative 18  12 - 46 %   Lymphs Abs 2.2  0.7 - 4.0 K/uL   Monocytes Relative 8  3 - 12 %   Monocytes Absolute 1.0  0.1 - 1.0 K/uL   Eosinophils Relative 2  0 - 5 %   Eosinophils Absolute 0.2  0.0 - 0.7 K/uL   Basophils Relative 0  0 - 1 %   Basophils Absolute 0.0  0.0 - 0.1 K/uL  COMPREHENSIVE METABOLIC PANEL     Status: Abnormal   Collection Time    11/20/13  6:20 PM      Result Value Ref Range   Sodium 137  137 - 147 mEq/L   Potassium 4.4  3.7 - 5.3 mEq/L   Chloride 98  96 - 112 mEq/L   CO2 26  19 - 32 mEq/L   Glucose, Bld 114 (*) 70 - 99 mg/dL   BUN 18  6 - 23 mg/dL   Creatinine, Ser 0.95  0.50 - 1.35 mg/dL   Calcium 10.0  8.4 - 10.5 mg/dL   Total  Protein 8.0  6.0 - 8.3 g/dL   Albumin 3.8  3.5 - 5.2 g/dL   AST 17  0 - 37 U/L   ALT 19  0 - 53 U/L   Alkaline Phosphatase 115  39 - 117 U/L   Total Bilirubin 0.3  0.3 - 1.2 mg/dL   GFR calc non Af Amer >90  >90 mL/min   GFR calc Af Amer >90  >90 mL/min   Comment: (NOTE)     The eGFR has been calculated using the CKD EPI equation.     This calculation has not been validated  in all clinical situations.     eGFR's persistently <90 mL/min signify possible Chronic Kidney     Disease.  LIPASE, BLOOD     Status: Abnormal   Collection Time    11/20/13  6:20 PM      Result Value Ref Range   Lipase 75 (*) 11 - 59 U/L    Radiology Reports: Ct Abdomen Pelvis W Contrast  11/20/2013   CLINICAL DATA:  Abdominal pain for of the last several days. Dual bowel movement since pain began.  EXAM: CT ABDOMEN AND PELVIS WITH CONTRAST  TECHNIQUE: Multidetector CT imaging of the abdomen and pelvis was performed using the standard protocol following bolus administration of intravenous contrast.  CONTRAST:  40mL OMNIPAQUE IOHEXOL 300 MG/ML SOLN, 174mL OMNIPAQUE IOHEXOL 300 MG/ML SOLN  COMPARISON:  None.  FINDINGS: The margins of the pancreatic head are mildly ill-defined and there is subtle stranding in the adjacent fat. There are prominent peripancreatic and gastrohepatic ligament lymph nodes. The largest is a posterior gastrohepatic ligament lymph node measuring 13 mm in short axis. There is a more dense oval mass or node just inferior to the tail the pancreas measuring 13 mm in short axis. There is no peripancreatic fluid. The pancreas enhances diffusely. The portal vein, splenic vein and superior mesenteric vein are widely patent.  Lung bases show minimal subsegmental atelectasis but are otherwise clear. The heart is normal in size.  Normal liver and spleen. Gallbladder is unremarkable. No dilation of the common bile duct. No adrenal masses.  2.6 cm cyst arises from the medial right kidney. Kidneys otherwise  unremarkable. Normal ureters. Normal bladder.  No other adenopathy. No ascites. There are atherosclerotic changes along the abdominal aorta. No aneurysm.  There is mild increased stool burden in the right colon. There is no bowel wall thickening or mesenteric inflammation. The small bowel is unremarkable. A normal appendix is visualized.  There are degenerative changes throughout the visualized spine. No osteoblastic or osteolytic lesions.  IMPRESSION: 1. Findings suggest mild pancreatitis if this correlates clinically. The appearance of the pancreas and adjacent mild peripancreatic and gastrohepatic ligament adenopathy may all be chronic, however. 2. Mild increased stool burden in the right colon. No evidence of bowel obstruction or bowel inflammation. Normal appendix is visualized. 3. Right renal cyst.  Degenerative changes of the visualized spine. 4. No other abnormalities.   Electronically Signed   By: Lajean Manes M.D.   On: 11/20/2013 20:27    Problem List  Principal Problem:   Acute pancreatitis Active Problems:   Sciatica   Constipation   HTN (hypertension), benign   PTSD (post-traumatic stress disorder)   Assessment: This is a 48 year old, Caucasian male, with a past medical history as stated earlier, who presents with abdominal pain, and constipation since Tuesday. His lipase is elevated and he does have CT scan findings suggestive of pancreatitis. Etiology for pancreatitis is not clear, but could be related to recently initiated Depakote. Depakote can cause pancreatitis. He will need biliary workup. He will have triglyceride levels checked. He does take tramadol for his back pain and that could be the reason for his constipation.  Plan: #1 acute pancreatitis: Possibly due to Depakote. Keep him n.p.o. except for ice chips. Check triglyceride levels. We will check ultrasound of the abdomen. Lipase will be trended. Narcotics will be provided. He denies any alcohol use. He will need to  discontinue taking Depakote  #2 constipation: Could be from narcotics. Could be due to pancreatitis. We will given stool  softeners and laxatives. TSH level will be checked.  #3 history of hypertension: Monitor blood pressures closely. Hydralazine as needed. Resume his oral medications when he is clinically better.  #4 history of posttraumatic stress disorder: Stable. Outpatient management.  #5 history of back pain with radiculopathy: No neurological deficits at this time. Continue to monitor   DVT Prophylaxis: Lovenox Code Status: Full code Family Communication: Discussed with the patient and his wife  Disposition Plan: Admit to MedSurg   Further management decisions will depend on results of further testing and patient's response to treatment.  [facility]  [facility] Pager [phone]  If 7PM-7AM, please contact night-coverage [email or web address] Password [email or web address]  11/20/2013, 9:20 PM

## 2013-11-20 NOTE — ED Provider Notes (Signed)
CSN: [id number]     Arrival date & time 11/20/13  1758 History  This chart was scribed for Maudry Diego, MD by Zettie Pho, ED Scribe. This patient was seen in room APA01/APA01 and the patient's care was started at 6:14 PM.    Chief Complaint  Patient presents with  . Abdominal Pain    Patient is a 48 y.o. male presenting with abdominal pain. The history is provided by the patient. No language interpreter was used.  Abdominal Pain Pain location:  Generalized Pain radiates to:  Does not radiate Pain severity:  Moderate Onset quality:  Gradual Timing:  Intermittent Progression:  Unchanged Chronicity:  New Relieved by:  Nothing Worsened by:  Nothing tried Ineffective treatments:  None tried Associated symptoms: constipation and nausea   Associated symptoms: no chest pain, no cough, no diarrhea, no fatigue, no hematuria and no vomiting    HPI Comments: Clinton Ross is a 48 y.o. male who presents to the Emergency Department complaining of an intermittent pain diffusely to the abdomen with associated constipation (last BM was 5 days ago) and nausea onset 5-6 days ago. Patient reports using 2 glycerin suppositories and enema at home earlier today without significant relief. He denies emesis. Patient is also complaining of some lower back pain that is chronic in nature secondary to his history of bulging disc that he states has been exacerbated since onset of his current symptoms. Patient also has a history of HTN. Patient reports that he has not drank alcohol in about 2 years.  PCP- [facility]  No past medical history on file. No past surgical history on file. Family History  Problem Relation Age of Onset  . Hypertension Mother   . Stroke Father   . Diabetes Other    History  Substance Use Topics  . Smoking status: Current Every Day Smoker -- 1.00 packs/day  . Smokeless tobacco: Not on file  . Alcohol Use: No     Comment: occasional    Review of Systems  Constitutional:  Negative for appetite change and fatigue.  HENT: Negative for congestion, ear discharge and sinus pressure.   Eyes: Negative for discharge.  Respiratory: Negative for cough.   Cardiovascular: Negative for chest pain.  Gastrointestinal: Positive for nausea, abdominal pain and constipation. Negative for vomiting and diarrhea.  Genitourinary: Negative for frequency and hematuria.  Musculoskeletal: Positive for back pain (chronic).  Skin: Negative for rash.  Neurological: Negative for seizures and headaches.  Psychiatric/Behavioral: Negative for hallucinations.      Allergies  Review of patient's allergies indicates no known allergies.  Home Medications   Current Outpatient Rx  Name  Route  Sig  Dispense  Refill  . cyclobenzaprine (FLEXERIL) 10 MG tablet   Oral   Take 1 tablet (10 mg total) by mouth 3 (three) times daily as needed.   21 tablet   0   . ibuprofen (ADVIL,MOTRIN) 800 MG tablet   Oral   Take 800 mg by mouth 2 (two) times daily as needed. For pain         . oxyCODONE-acetaminophen (PERCOCET/ROXICET) 5-325 MG per tablet   Oral   Take 1 tablet by mouth every 4 (four) hours as needed for pain.   15 tablet   0   . predniSONE (DELTASONE) 10 MG tablet      Take 6 tablets day one, 5 tablets day two, 4 tablets day three, 3 tablets day four, 2 tablets day five, then 1 tablet day six  21 tablet   0    Triage Vitals: BP 134/102  Pulse 107  Temp(Src) 97.5 F (36.4 C) (Oral)  Resp 20  Ht 5\' 10"  (1.778 m)  Wt 198 lb (89.812 kg)  BMI 28.41 kg/m2  SpO2 94%  Physical Exam  Constitutional: He is oriented to person, place, and time. He appears well-developed.  HENT:  Head: Normocephalic.  Eyes: Conjunctivae and EOM are normal. No scleral icterus.  Neck: Neck supple. No thyromegaly present.  Cardiovascular: Normal rate and regular rhythm.  Exam reveals no gallop and no friction rub.   No murmur heard. Pulmonary/Chest: No stridor. He has no wheezes. He has no  rales. He exhibits no tenderness.  Abdominal: He exhibits no distension. There is tenderness. There is no rebound.  Moderate tenderness to palpation to the RLQ, LLQ, and periumbilical region.   Musculoskeletal: Normal range of motion. He exhibits no edema.  Lymphadenopathy:    He has no cervical adenopathy.  Neurological: He is oriented to person, place, and time. He exhibits normal muscle tone. Coordination normal.  Skin: No rash noted. No erythema.  Psychiatric: He has a normal mood and affect. His behavior is normal.    ED Course  Procedures (including critical care time)  DIAGNOSTIC STUDIES: Oxygen Saturation is 94% on room air, adequate by my interpretation.    COORDINATION OF CARE: 6:17 PM- Will order a CT of the abdomen, blood labs (CBC, CMP, lipase), and UA. Will order Dilaudid and Zofran to manage symptoms. Discussed treatment plan with patient at bedside and patient verbalized agreement.   8:48 PM- Patient reports that his abdominal pain has much improved since receiving the medication. Discussed that lab and imaging results indicate some mild pancreatitis. Discussed that the patient may need to be admitted to the hospital for further evaluation and care. Discussed treatment plan with patient at bedside and patient verbalized agreement.    Labs Review Labs Reviewed  CBC WITH DIFFERENTIAL - Abnormal; Notable for the following:    WBC 12.2 (*)    RBC 6.25 (*)    Hemoglobin 18.9 (*)    HCT 52.3 (*)    MCHC 36.1 (*)    Neutro Abs 8.8 (*)    All other components within normal limits  COMPREHENSIVE METABOLIC PANEL - Abnormal; Notable for the following:    Glucose, Bld 114 (*)    All other components within normal limits  LIPASE, BLOOD - Abnormal; Notable for the following:    Lipase 75 (*)    All other components within normal limits  URINALYSIS, ROUTINE W REFLEX MICROSCOPIC   Imaging Review Ct Abdomen Pelvis W Contrast  11/20/2013   CLINICAL DATA:  Abdominal pain for  of the last several days. Dual bowel movement since pain began.  EXAM: CT ABDOMEN AND PELVIS WITH CONTRAST  TECHNIQUE: Multidetector CT imaging of the abdomen and pelvis was performed using the standard protocol following bolus administration of intravenous contrast.  CONTRAST:  41mL OMNIPAQUE IOHEXOL 300 MG/ML SOLN, 163mL OMNIPAQUE IOHEXOL 300 MG/ML SOLN  COMPARISON:  None.  FINDINGS: The margins of the pancreatic head are mildly ill-defined and there is subtle stranding in the adjacent fat. There are prominent peripancreatic and gastrohepatic ligament lymph nodes. The largest is a posterior gastrohepatic ligament lymph node measuring 13 mm in short axis. There is a more dense oval mass or node just inferior to the tail the pancreas measuring 13 mm in short axis. There is no peripancreatic fluid. The pancreas enhances diffusely. The portal vein,  splenic vein and superior mesenteric vein are widely patent.  Lung bases show minimal subsegmental atelectasis but are otherwise clear. The heart is normal in size.  Normal liver and spleen. Gallbladder is unremarkable. No dilation of the common bile duct. No adrenal masses.  2.6 cm cyst arises from the medial right kidney. Kidneys otherwise unremarkable. Normal ureters. Normal bladder.  No other adenopathy. No ascites. There are atherosclerotic changes along the abdominal aorta. No aneurysm.  There is mild increased stool burden in the right colon. There is no bowel wall thickening or mesenteric inflammation. The small bowel is unremarkable. A normal appendix is visualized.  There are degenerative changes throughout the visualized spine. No osteoblastic or osteolytic lesions.  IMPRESSION: 1. Findings suggest mild pancreatitis if this correlates clinically. The appearance of the pancreas and adjacent mild peripancreatic and gastrohepatic ligament adenopathy may all be chronic, however. 2. Mild increased stool burden in the right colon. No evidence of bowel obstruction or  bowel inflammation. Normal appendix is visualized. 3. Right renal cyst.  Degenerative changes of the visualized spine. 4. No other abnormalities.   Electronically Signed   By: Lajean Manes M.D.   On: 11/20/2013 20:27     EKG Interpretation None      MDM   Final diagnoses:  None    The chart was scribed for me under my direct supervision.  I personally performed the history, physical, and medical decision making and all procedures in the evaluation of this patient.Maudry Diego, MD 11/20/13 2059

## 2013-11-20 NOTE — ED Notes (Signed)
Patient wanting to go to restroom. Informed him that he was given pain medication and it would be for his safety if he would use urinal in room at bedside. Patient drinking contrast. States that he wasn't sure if he would be able to drink both bottles.

## 2013-11-20 NOTE — ED Notes (Signed)
Pt c/o generalized abdominal pain and tenderness since Tuesday. Pt states his last BM was Tuesday night. Pt states he used two enemas as well as two glycerin suppository at home without success. Pt denies n/v.

## 2013-11-20 NOTE — ED Notes (Signed)
Patient medicated per PRN order for pain rated 7/10.

## 2013-11-20 NOTE — ED Notes (Signed)
Pt complain of abdominal pain since Tuesday. States he has not had a bowel movement since then. States he took two glycerin suppository and an enema today without any result

## 2013-11-21 ENCOUNTER — Inpatient Hospital Stay (HOSPITAL_COMMUNITY): Payer: Non-veteran care

## 2013-11-21 DIAGNOSIS — F4310 Post-traumatic stress disorder, unspecified: Secondary | ICD-10-CM

## 2013-11-21 LAB — COMPREHENSIVE METABOLIC PANEL
ALT: 16 U/L (ref 0–53)
AST: 15 U/L (ref 0–37)
Albumin: 3.1 g/dL — ABNORMAL LOW (ref 3.5–5.2)
Alkaline Phosphatase: 90 U/L (ref 39–117)
BUN: 16 mg/dL (ref 6–23)
CALCIUM: 8.8 mg/dL (ref 8.4–10.5)
CHLORIDE: 100 meq/L (ref 96–112)
CO2: 27 mEq/L (ref 19–32)
Creatinine, Ser: 0.91 mg/dL (ref 0.50–1.35)
GFR calc Af Amer: >90 mL/min (ref >90)
Glucose, Bld: 88 mg/dL (ref 70–99)
Potassium: 4 mEq/L (ref 3.7–5.3)
SODIUM: 136 meq/L — AB (ref 137–147)
Total Bilirubin: 0.5 mg/dL (ref 0.3–1.2)
Total Protein: 6.4 g/dL (ref 6.0–8.3)

## 2013-11-21 LAB — CBC
HEMATOCRIT: 47 % (ref 39.0–52.0)
HEMOGLOBIN: 16.4 g/dL (ref 13.0–17.0)
MCH: 29.5 pg (ref 26.0–34.0)
MCHC: 34.9 g/dL (ref 30.0–36.0)
MCV: 84.5 fL (ref 78.0–100.0)
Platelets: 179 10*3/uL (ref 150–400)
RBC: 5.56 MIL/uL (ref 4.22–5.81)
RDW: 12.8 % (ref 11.5–15.5)
WBC: 9.4 10*3/uL (ref 4.0–10.5)

## 2013-11-21 LAB — PROTIME-INR
INR: 1.07 (ref 0.00–1.49)
PROTHROMBIN TIME: 13.7 s (ref 11.6–15.2)

## 2013-11-21 LAB — LIPASE, BLOOD: LIPASE: 99 U/L — AB (ref 11–59)

## 2013-11-21 LAB — TSH: TSH: 3.803 u[IU]/mL (ref 0.350–4.500)

## 2013-11-21 IMAGING — US US ABDOMEN COMPLETE
1 series · 14 of 25 positions shown · non-contrast
Comparison: CT ABD - PELV W/ CM dated [DATE]

CLINICAL DATA: Right upper quadrant tenderness. History of
pancreatitis.

EXAM:
ULTRASOUND ABDOMEN COMPLETE

[Series 1: us abdomen complete · 0.27mm/px · 14 of 108 slices shown]
[im 1/108]
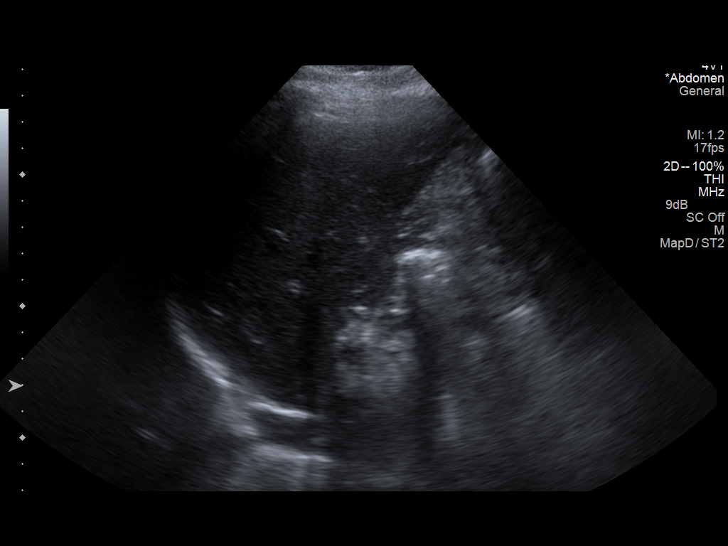
[im 9/108]
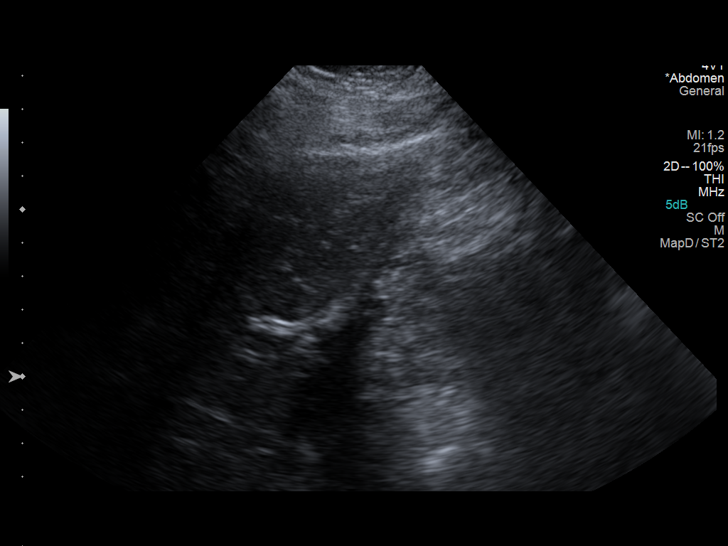
[im 18/108]
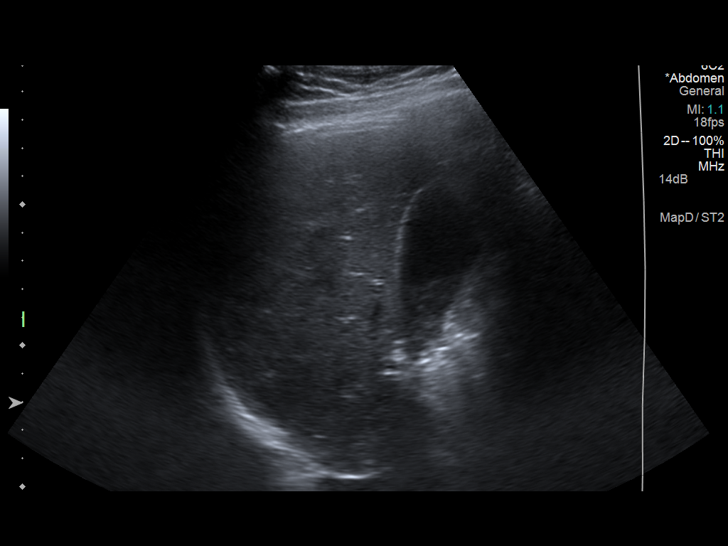
[im 27/108]
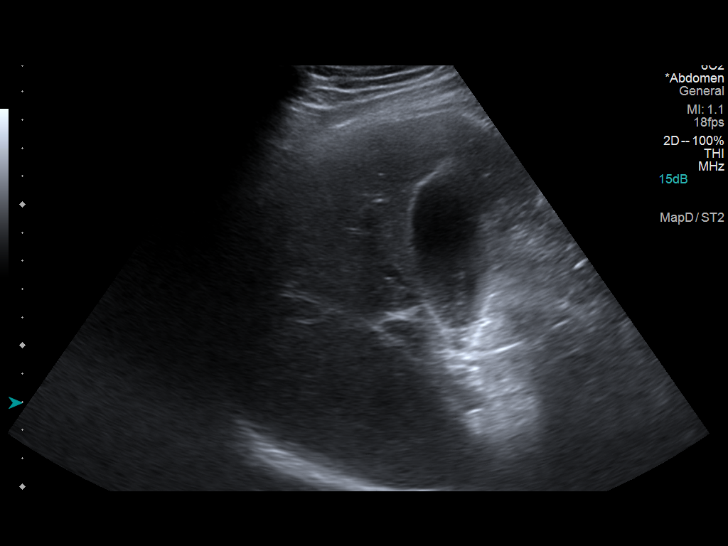
[im 36/108]
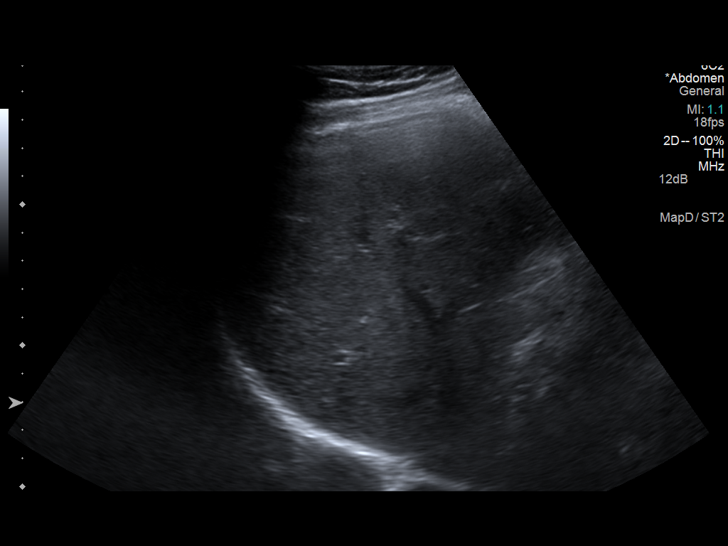
[im 41/108]
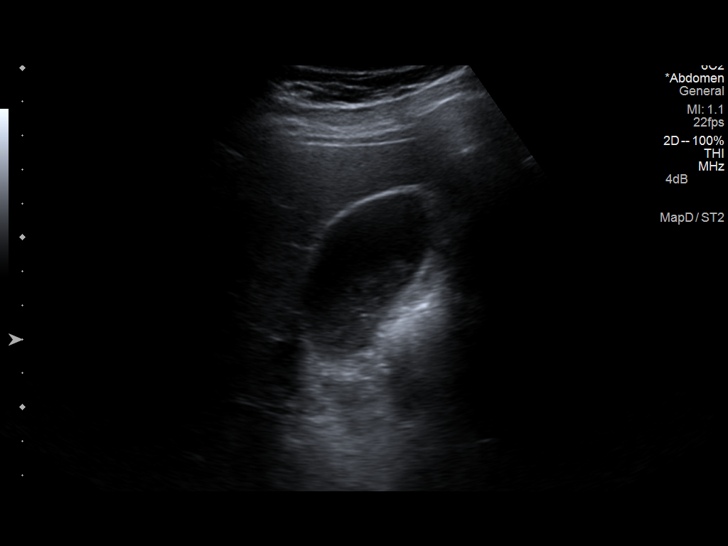
[im 50/108]
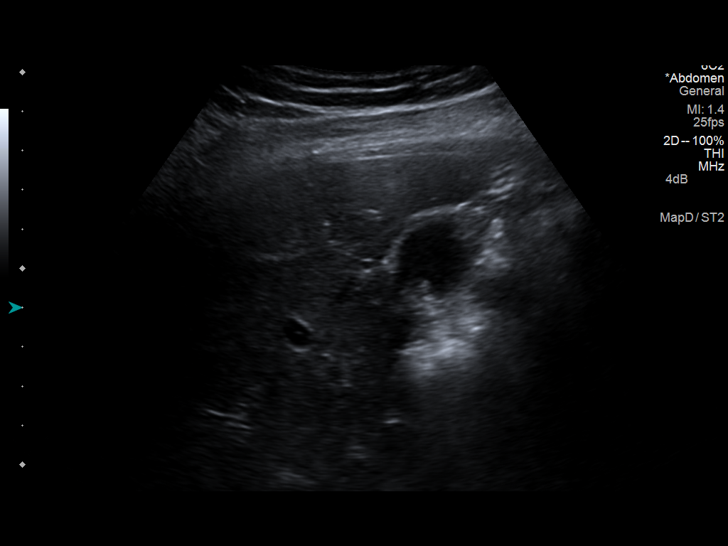
[im 58/108]
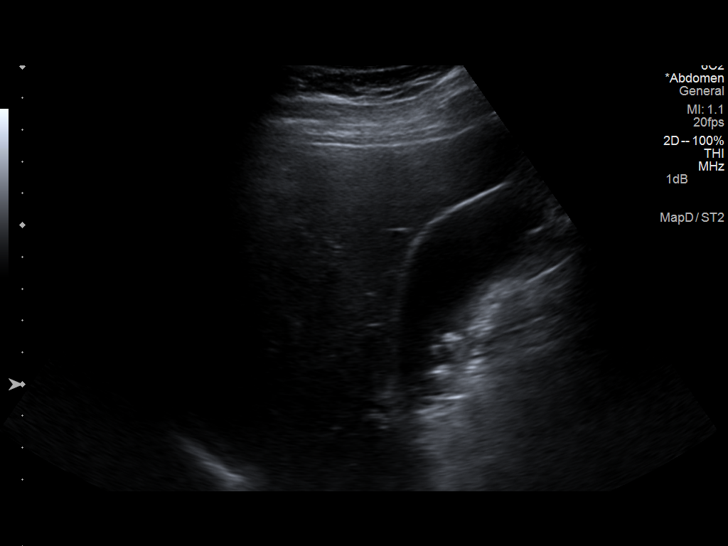
[im 67/108]
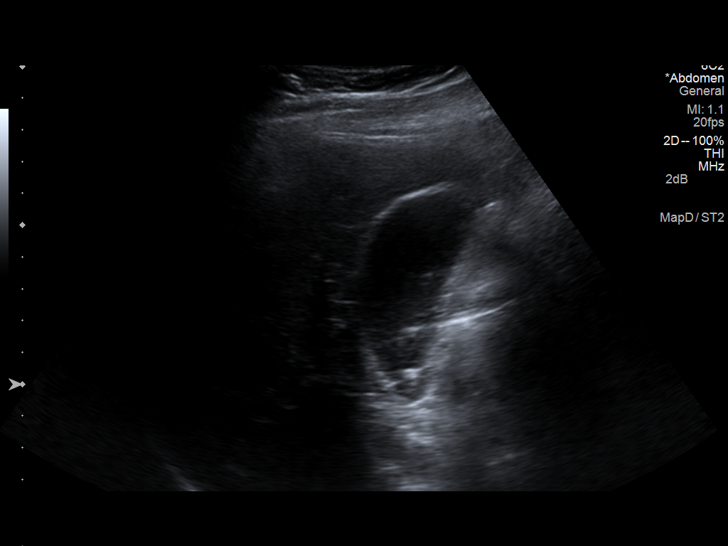
[im 72/108]
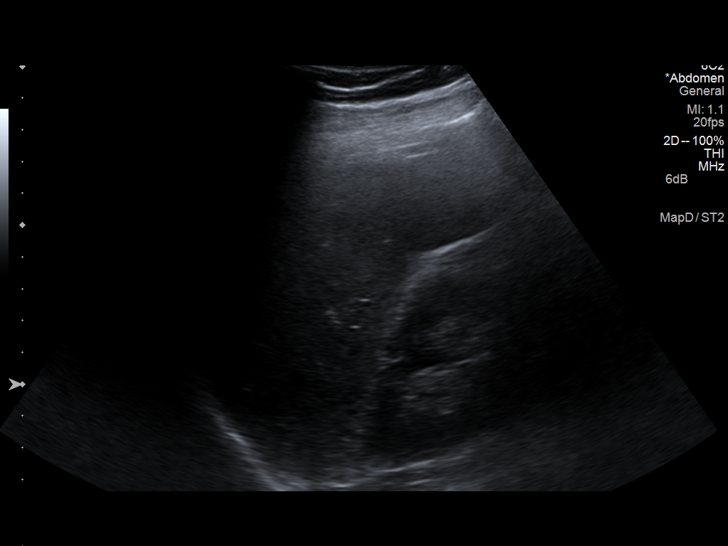
[im 81/108]
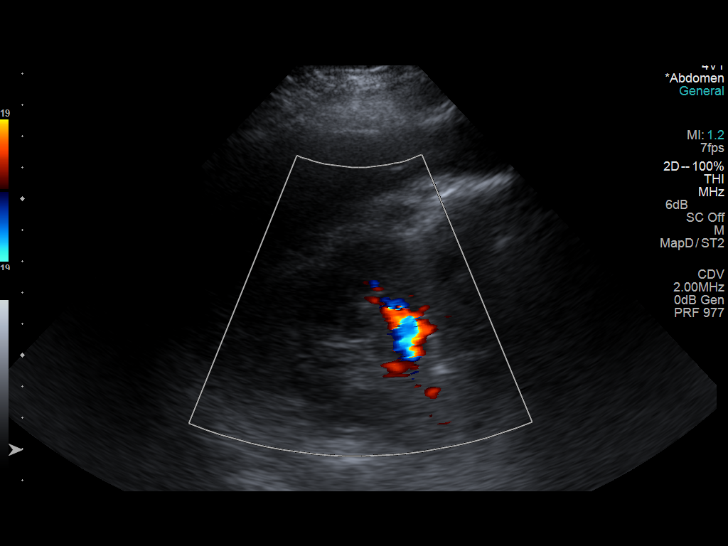
[im 90/108]
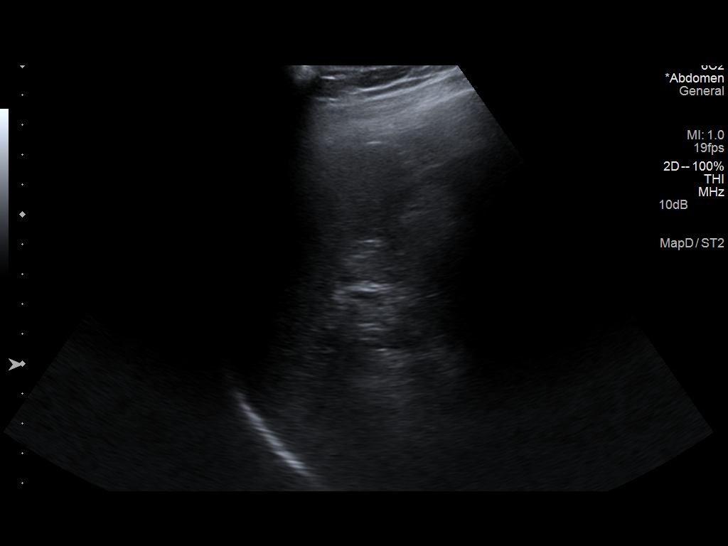
[im 99/108]
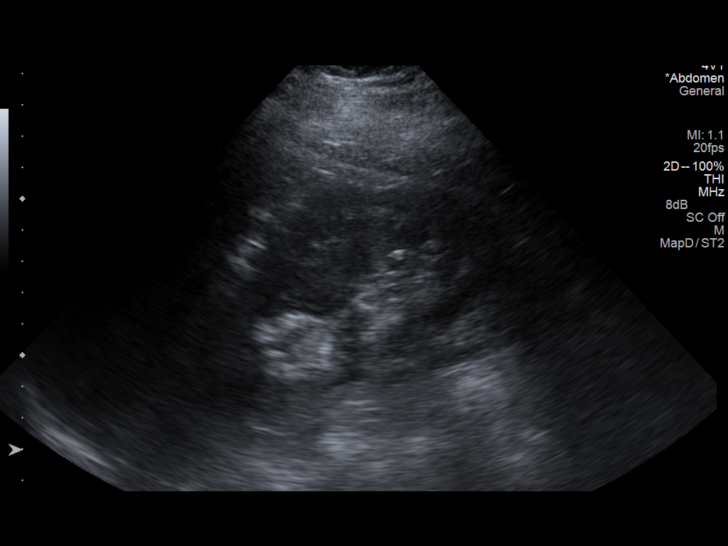
[im 108/108]
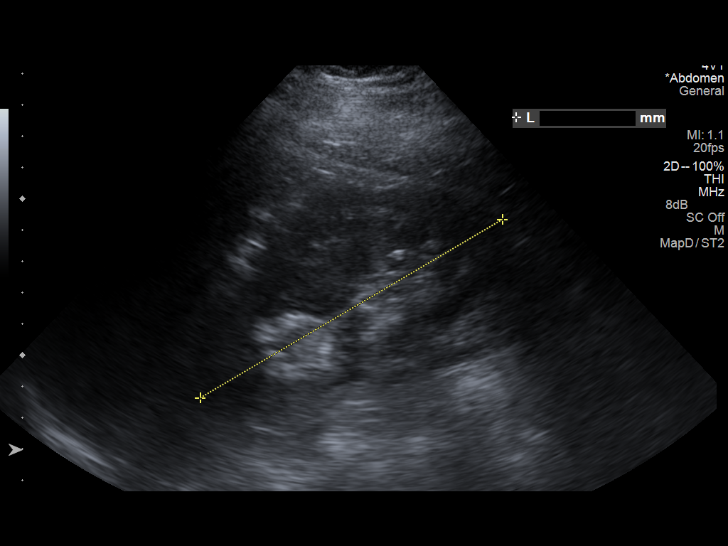

[14 of 25 positions shown; findings below may reference images not displayed]

FINDINGS: Gallbladder:

Sludge and small stones within. No pericholecystic fluid or wall
thickening. Sonographic Murphy's sign was not elicited.

Common bile duct:

Diameter: Normal, 3 mm.

Liver:

No focal lesion identified. Within normal limits in parenchymal
echogenicity.

IVC:

No abnormality visualized.

Pancreas:

Visualized portion unremarkable.

Spleen:

Size and appearance within normal limits.

Right Kidney:

Length: 11.1 cm. 1.9 cm lower pole lesion which corresponds with a
cyst on yesterday's CT. No hydronephrosis.

Left Kidney:

Length: 11.2 cm.. Echogenicity within normal limits. No mass or
hydronephrosis visualized.

Abdominal aorta:

No aneurysm visualized.

Other findings:

None.
IMPRESSION: Gallbladder bones and sludge without acute cholecystitis or biliary
ductal dilatation.

## 2013-11-21 MED ORDER — LISINOPRIL 10 MG PO TABS
40.0000 mg | ORAL_TABLET | Freq: Every day | ORAL | Status: DC
  Filled 2013-11-21: qty 4

## 2013-11-21 MED ORDER — MAGNESIUM CITRATE PO SOLN
1.0000 | Freq: Once | ORAL | Status: AC
  Administered 2013-11-21: 1 via ORAL
  Filled 2013-11-21: qty 296

## 2013-11-21 MED ORDER — LISINOPRIL 10 MG PO TABS
20.0000 mg | ORAL_TABLET | Freq: Every day | ORAL | Status: DC
  Administered 2013-11-21 – 2013-11-23 (×3): 20 mg via ORAL
  Filled 2013-11-21 (×2): qty 2

## 2013-11-21 MED ORDER — MILK AND MOLASSES ENEMA
1.0000 | Freq: Once | RECTAL | Status: AC
  Administered 2013-11-21: 250 mL via RECTAL

## 2013-11-21 NOTE — Progress Notes (Signed)
11/21/13 1256 Notified Dr. Roderic Palau of abdominal u/s results. Notified patient c/o sharp abdominal pain this morning, decreased with Vicodin as ordered PRN for pain, rated 2/10 on scale of 0-10. Pt requesting "something to eat". Order received for clear liquid diet. Donavan Foil, RN

## 2013-11-21 NOTE — Progress Notes (Signed)
11/21/13 0918 Patient reports smoking use, approximately 1/2-1 ppd. Discussed with patient hospital is smoke-free facility. Notified nursing can request nicotine patch if needed. Stated "i have to be pretty bad off to use those". States will notify nursing if needed. Donavan Foil, RN

## 2013-11-21 NOTE — Plan of Care (Signed)
Problem: Phase I Progression Outcomes Goal: OOB as tolerated unless otherwise ordered Outcome: Progressing 11/21/13 1056 Patient up to chair, ambulates in room. Ambulated in hallway with wife assist this morning. Tolerated well, steady gait. Instructed to call for assist as needed. Donavan Foil, RN

## 2013-11-21 NOTE — Progress Notes (Signed)
[facility] PROGRESS NOTE  ARLO BUTT TDV:[id number] DOB: [birth year] DOA: 11/20/2013 PCP: Default, Provider, MD  Assessment/Plan: 1. Acute pancreatitis. Possibly related to recently being started on Depakote. Continue supportive management. The patient wishes to try clear liquids. Continue IV fluids as well as pain management. Trend lipase levels. 2. Cholelithiasis. Unclear if this is contributing to #1. He does not have any bile that dilatation or derangement in his transaminases. Can likely have outpatient surgical evaluation. We'll discuss with Gen. Surgery. 3. Constipation. Will give milk of molasses enema. TSH level is being checked. 4. Hypertension. We'll resume oral medications. 5. PTSD. Depakote has been discontinued. This will need to be further addressed in the outpatient setting with a new agent.  Code Status: full code Family Communication: discussed with patient and wife Disposition Plan: discharge home once improved   Consultants:    Procedures:    Antibiotics:    HPI/Subjective: Reports pain is better with medications, although still present. No vomiting. Reports constipation has not had a bowel movement in 5 days.  Objective: Filed Vitals:   11/21/13 1550  BP: 168/98  Pulse: 70  Temp: 98.5 F (36.9 C)  Resp: 20    Intake/Output Summary (Last 24 hours) at 11/21/13 1658 Last data filed at 11/21/13 1640  Gross per 24 hour  Intake   1180 ml  Output    200 ml  Net    980 ml   Filed Weights   11/20/13 1813 11/20/13 2220  Weight: 89.812 kg (198 lb) 86.5 kg (190 lb 11.2 oz)    Exam:   General:  NAD  Cardiovascular: S1, S2 RRR  Respiratory: CTA B  Abdomen: soft, tender in epigastrium, RUQ and periumbilical area  Musculoskeletal: no edema b/l   Data Reviewed: Basic Metabolic Panel:  Recent Labs Lab 11/20/13 1820 11/21/13 0521  NA 137 136*  K 4.4 4.0  CL 98 100  CO2 26 27  GLUCOSE 114* 88  BUN 18 16  CREATININE 0.95 0.91   CALCIUM 10.0 8.8   Liver Function Tests:  Recent Labs Lab 11/20/13 1820 11/21/13 0521  AST 17 15  ALT 19 16  ALKPHOS 115 90  BILITOT 0.3 0.5  PROT 8.0 6.4  ALBUMIN 3.8 3.1*    Recent Labs Lab 11/20/13 1820 11/21/13 0521  LIPASE 75* 99*   No results found for this basename: AMMONIA,  in the last 168 hours CBC:  Recent Labs Lab 11/20/13 1820 11/21/13 0521  WBC 12.2* 9.4  NEUTROABS 8.8*  --   HGB 18.9* 16.4  HCT 52.3* 47.0  MCV 83.7 84.5  PLT 228 179   Cardiac Enzymes: No results found for this basename: CKTOTAL, CKMB, CKMBINDEX, TROPONINI,  in the last 168 hours BNP (last 3 results) No results found for this basename: PROBNP,  in the last 8760 hours CBG: No results found for this basename: GLUCAP,  in the last 168 hours  No results found for this or any previous visit (from the past 240 hour(s)).   Studies: US Abdomen Complete  11/21/2013   CLINICAL DATA:  Right upper quadrant tenderness. History of pancreatitis.  EXAM: ULTRASOUND ABDOMEN COMPLETE  COMPARISON:  CT ABD - PELV W/ CM dated 11/20/2013  FINDINGS: Gallbladder:  Sludge and small stones within. No pericholecystic fluid or wall thickening. Sonographic Murphy's sign was not elicited.  Common bile duct:  Diameter: Normal, 3 mm.  Liver:  No focal lesion identified. Within normal limits in parenchymal echogenicity.  IVC:  No abnormality visualized.  Pancreas:  Visualized portion unremarkable.  Spleen:  Size and appearance within normal limits.  Right Kidney:  Length: 11.1 cm. 1.9 cm lower pole lesion which corresponds with a cyst on yesterday's CT. No hydronephrosis.  Left Kidney:  Length: 11.2 cm. Echogenicity within normal limits. No mass or hydronephrosis visualized.  Abdominal aorta:  No aneurysm visualized.  Other findings:  None.  IMPRESSION: Gallbladder bones and sludge without acute cholecystitis or biliary ductal dilatation.   Electronically Signed   By: Abigail Miyamoto M.D.   On: 11/21/2013 12:18   Ct Abdomen  Pelvis W Contrast  11/20/2013   CLINICAL DATA:  Abdominal pain for of the last several days. Dual bowel movement since pain began.  EXAM: CT ABDOMEN AND PELVIS WITH CONTRAST  TECHNIQUE: Multidetector CT imaging of the abdomen and pelvis was performed using the standard protocol following bolus administration of intravenous contrast.  CONTRAST:  1mL OMNIPAQUE IOHEXOL 300 MG/ML SOLN, 1109mL OMNIPAQUE IOHEXOL 300 MG/ML SOLN  COMPARISON:  None.  FINDINGS: The margins of the pancreatic head are mildly ill-defined and there is subtle stranding in the adjacent fat. There are prominent peripancreatic and gastrohepatic ligament lymph nodes. The largest is a posterior gastrohepatic ligament lymph node measuring 13 mm in short axis. There is a more dense oval mass or node just inferior to the tail the pancreas measuring 13 mm in short axis. There is no peripancreatic fluid. The pancreas enhances diffusely. The portal vein, splenic vein and superior mesenteric vein are widely patent.  Lung bases show minimal subsegmental atelectasis but are otherwise clear. The heart is normal in size.  Normal liver and spleen. Gallbladder is unremarkable. No dilation of the common bile duct. No adrenal masses.  2.6 cm cyst arises from the medial right kidney. Kidneys otherwise unremarkable. Normal ureters. Normal bladder.  No other adenopathy. No ascites. There are atherosclerotic changes along the abdominal aorta. No aneurysm.  There is mild increased stool burden in the right colon. There is no bowel wall thickening or mesenteric inflammation. The small bowel is unremarkable. A normal appendix is visualized.  There are degenerative changes throughout the visualized spine. No osteoblastic or osteolytic lesions.  IMPRESSION: 1. Findings suggest mild pancreatitis if this correlates clinically. The appearance of the pancreas and adjacent mild peripancreatic and gastrohepatic ligament adenopathy may all be chronic, however. 2. Mild increased  stool burden in the right colon. No evidence of bowel obstruction or bowel inflammation. Normal appendix is visualized. 3. Right renal cyst.  Degenerative changes of the visualized spine. 4. No other abnormalities.   Electronically Signed   By: Lajean Manes M.D.   On: 11/20/2013 20:27    Scheduled Meds: . docusate sodium  100 mg Oral BID  . enoxaparin (LOVENOX) injection  40 mg Subcutaneous Q24H  . polyethylene glycol  17 g Oral Daily   Continuous Infusions: . sodium chloride 100 mL/hr at 11/21/13 1640    Principal Problem:   Acute pancreatitis Active Problems:   Sciatica   Constipation   HTN (hypertension), benign   PTSD (post-traumatic stress disorder)    Time spent: 53mins    MEMON,JEHANZEB  [facility] Pager [phone]. If 7PM-7AM, please contact night-coverage at [email or web address], password [facility] 11/21/2013, 4:58 PM  LOS: 1 day

## 2013-11-21 NOTE — Progress Notes (Signed)
11/21/13 1410 Milk of molasses enema as ordered. Pt preferred to self-administer with nursing staff supervision. Okay per MD with nursing supervision. Pt tolerated fairly well.

## 2013-11-21 NOTE — Progress Notes (Signed)
11/21/13 1542 Patient reported having "hard stool" results after receiving milk of molasses enema as ordered. Notified MD. Instructed patient to notify of any further discomfort. Tolerating clear liquids well, no c/o nausea. Donavan Foil, RN

## 2013-11-21 NOTE — Progress Notes (Signed)
11/21/13 1823 Patient stated did not care for broth provided on clear liquid tray. Nursing staff requested chicken noodle soup from dietary, provided broth for patient. Pt tolerated well, no complaints. Chicken noodle soup broth provided for patient supper as well. Dietary staff aware patient does not care for powdered broth. Donavan Foil ,RN

## 2013-11-21 NOTE — Progress Notes (Signed)
11/21/13 1851 Order for lisinopril 40 mg po daily to start this evening. Patient stated only takes 1/2 of lisinopril 40 mg tablet daily. Notified Dr. Roderic Palau, stated lisinopril 20 mg po daily instead. Lisinopril given as ordered. Pt states has had some results from 1/2 of mag citrate as ordered. Prefers to wait a while before taking second 1/2 of mag citrate bottle. Dr. Roderic Palau aware. Donavan Foil, RN

## 2013-11-21 NOTE — Progress Notes (Signed)
Utilization review Completed Bridget Meridian RN BSN   

## 2013-11-22 LAB — LIPASE, BLOOD: Lipase: 61 U/L — ABNORMAL HIGH (ref 11–59)

## 2013-11-22 MED ORDER — POLYETHYLENE GLYCOL 3350 17 G PO PACK
1.0000 | PACK | Freq: Once | ORAL | Status: AC
  Administered 2013-11-22: 17 g via ORAL
  Filled 2013-11-22: qty 1

## 2013-11-22 NOTE — Progress Notes (Signed)
Pt ambulating the hall.  No further c/o of pain.

## 2013-11-22 NOTE — Progress Notes (Signed)
[facility] PROGRESS NOTE  Clinton Ross FTD:[id number] DOB: December 29, 1965 DOA: 11/20/2013 PCP: Default, Provider, MD  Assessment/Plan: 1. Acute pancreatitis. Possibly related to recently being started on Depakote. Continue supportive management. Appears to be tolerating clear liquids. Lipase levels are trending down.  Will advance diet to low fat. 2. Cholelithiasis. Unclear if this is contributing to #1. He does not have any bile ductdilatation or derangement in his transaminases. Discussed with general surgery and recommended outpatient evaluation in 4 weeks for cholecystectomy 3. Constipation. TSH normal.  No significant results with milk and molasses enema or mag citrate. Will give miralax bowel prep. 4. Hypertension. We'll resume oral medications. 5. PTSD. Depakote has been discontinued. This will need to be further addressed in the outpatient setting with a new agent.  Code Status: full code Family Communication: discussed with patient and wife Disposition Plan: discharge home once improved   Consultants:    Procedures:    Antibiotics:    HPI/Subjective: Pain in abdomen is better controlled, no vomiting.  He does not report significant bowel movements after milk and molasses enema and mag citrate.  Objective: Filed Vitals:   11/22/13 0439  BP: 114/74  Pulse: 77  Temp: 97.8 F (36.6 C)  Resp: 20    Intake/Output Summary (Last 24 hours) at 11/22/13 1311 Last data filed at 11/21/13 1640  Gross per 24 hour  Intake    940 ml  Output      0 ml  Net    940 ml   Filed Weights   11/20/13 1813 11/20/13 2220  Weight: 89.812 kg (198 lb) 86.5 kg (190 lb 11.2 oz)    Exam:   General:  NAD  Cardiovascular: S1, S2 RRR  Respiratory: CTA B  Abdomen: soft, tender in epigastrium, RUQ and periumbilical area  Musculoskeletal: no edema b/l   Data Reviewed: Basic Metabolic Panel:  Recent Labs Lab 11/20/13 1820 11/21/13 0521  NA 137 136*  K 4.4 4.0  CL 98 100   CO2 26 27  GLUCOSE 114* 88  BUN 18 16  CREATININE 0.95 0.91  CALCIUM 10.0 8.8   Liver Function Tests:  Recent Labs Lab 11/20/13 1820 11/21/13 0521  AST 17 15  ALT 19 16  ALKPHOS 115 90  BILITOT 0.3 0.5  PROT 8.0 6.4  ALBUMIN 3.8 3.1*    Recent Labs Lab 11/20/13 1820 11/21/13 0521 11/22/13 0553  LIPASE 75* 99* 61*   No results found for this basename: AMMONIA,  in the last 168 hours CBC:  Recent Labs Lab 11/20/13 1820 11/21/13 0521  WBC 12.2* 9.4  NEUTROABS 8.8*  --   HGB 18.9* 16.4  HCT 52.3* 47.0  MCV 83.7 84.5  PLT 228 179   Cardiac Enzymes: No results found for this basename: CKTOTAL, CKMB, CKMBINDEX, TROPONINI,  in the last 168 hours BNP (last 3 results) No results found for this basename: PROBNP,  in the last 8760 hours CBG: No results found for this basename: GLUCAP,  in the last 168 hours  No results found for this or any previous visit (from the past 240 hour(s)).   Studies: US Abdomen Complete  11/21/2013   CLINICAL DATA:  Right upper quadrant tenderness. History of pancreatitis.  EXAM: ULTRASOUND ABDOMEN COMPLETE  COMPARISON:  CT ABD - PELV W/ CM dated 11/20/2013  FINDINGS: Gallbladder:  Sludge and small stones within. No pericholecystic fluid or wall thickening. Sonographic Murphy's sign was not elicited.  Common bile duct:  Diameter: Normal, 3 mm.  Liver:  No focal lesion identified. Within normal limits in parenchymal echogenicity.  IVC:  No abnormality visualized.  Pancreas:  Visualized portion unremarkable.  Spleen:  Size and appearance within normal limits.  Right Kidney:  Length: 11.1 cm. 1.9 cm lower pole lesion which corresponds with a cyst on yesterday's CT. No hydronephrosis.  Left Kidney:  Length: 11.2 cm. Echogenicity within normal limits. No mass or hydronephrosis visualized.  Abdominal aorta:  No aneurysm visualized.  Other findings:  None.  IMPRESSION: Gallbladder bones and sludge without acute cholecystitis or biliary ductal dilatation.    Electronically Signed   By: Abigail Miyamoto M.D.   On: 11/21/2013 12:18   Ct Abdomen Pelvis W Contrast  11/20/2013   CLINICAL DATA:  Abdominal pain for of the last several days. Dual bowel movement since pain began.  EXAM: CT ABDOMEN AND PELVIS WITH CONTRAST  TECHNIQUE: Multidetector CT imaging of the abdomen and pelvis was performed using the standard protocol following bolus administration of intravenous contrast.  CONTRAST:  86mL OMNIPAQUE IOHEXOL 300 MG/ML SOLN, 152mL OMNIPAQUE IOHEXOL 300 MG/ML SOLN  COMPARISON:  None.  FINDINGS: The margins of the pancreatic head are mildly ill-defined and there is subtle stranding in the adjacent fat. There are prominent peripancreatic and gastrohepatic ligament lymph nodes. The largest is a posterior gastrohepatic ligament lymph node measuring 13 mm in short axis. There is a more dense oval mass or node just inferior to the tail the pancreas measuring 13 mm in short axis. There is no peripancreatic fluid. The pancreas enhances diffusely. The portal vein, splenic vein and superior mesenteric vein are widely patent.  Lung bases show minimal subsegmental atelectasis but are otherwise clear. The heart is normal in size.  Normal liver and spleen. Gallbladder is unremarkable. No dilation of the common bile duct. No adrenal masses.  2.6 cm cyst arises from the medial right kidney. Kidneys otherwise unremarkable. Normal ureters. Normal bladder.  No other adenopathy. No ascites. There are atherosclerotic changes along the abdominal aorta. No aneurysm.  There is mild increased stool burden in the right colon. There is no bowel wall thickening or mesenteric inflammation. The small bowel is unremarkable. A normal appendix is visualized.  There are degenerative changes throughout the visualized spine. No osteoblastic or osteolytic lesions.  IMPRESSION: 1. Findings suggest mild pancreatitis if this correlates clinically. The appearance of the pancreas and adjacent mild peripancreatic and  gastrohepatic ligament adenopathy may all be chronic, however. 2. Mild increased stool burden in the right colon. No evidence of bowel obstruction or bowel inflammation. Normal appendix is visualized. 3. Right renal cyst.  Degenerative changes of the visualized spine. 4. No other abnormalities.   Electronically Signed   By: Lajean Manes M.D.   On: 11/20/2013 20:27    Scheduled Meds: . docusate sodium  100 mg Oral BID  . enoxaparin (LOVENOX) injection  40 mg Subcutaneous Q24H  . lisinopril  20 mg Oral Daily  . polyethylene glycol  17 g Oral Daily  . polyethylene glycol powder  1 Container Oral Once   Continuous Infusions:    Principal Problem:   Acute pancreatitis Active Problems:   Sciatica   Constipation   HTN (hypertension), benign   PTSD (post-traumatic stress disorder)    Time spent: 63mins    Quayshawn Nin  [facility] Pager [phone]. If 7PM-7AM, please contact night-coverage at [email or web address], password [facility] 11/22/2013, 1:11 PM  LOS: 2 days

## 2013-11-22 NOTE — Progress Notes (Signed)
Pt ate 100% of lunch tray.  No c/o nausea or pain.  Pt wanted pain medication to be given prior to trying to have a BM.  Educated patient not to strain as this could cause more problems than he has.

## 2013-11-22 NOTE — Care Management Note (Addendum)
    Page 1 of 1   11/23/2013     2:23:38 PM   CARE MANAGEMENT NOTE 11/23/2013  Patient:  Clinton Ross, Clinton Ross   Account Number:  [id number]  Date Initiated:  11/22/2013  Documentation initiated by:  Claretha Cooper  Subjective/Objective Assessment:   pt from home with spouse. No hh needs anticipated. [facility] [place], PCP Dr. Ace Gins.     Action/Plan:   Anticipated DC Date:  11/22/2013   Anticipated DC Plan:  [place]  CM consult      Choice offered to / List presented to:             Status of service:  Completed, signed off Medicare Important Message given?   (If response is "NO", the following Medicare IM given date fields will be blank) Date Medicare IM given:   Date Additional Medicare IM given:    Discharge Disposition:  HOME/SELF CARE  Per UR Regulation:    If discussed at Long Length of Stay Meetings, dates discussed:    Comments:  11/23/13 Claretha Cooper RN BSN CM At pt's request, CM faxed DC and H&P summaries attn:Donna Surface at [facility]  11/22/13 Olivia Pavelko RN BSN CM Notifeid Madelaine Etienne that pt has [facility] benefits, not self pay

## 2013-11-22 NOTE — Progress Notes (Signed)
Pt ambulated hallway x2 without assistance.  Pt ate breakfast w/o c/o of nausea.  Pt c/o pain when straining to have a BM. Rates pain at an 8 prior to medication being given.  At this time pt resting in bed with eyes closed and no s/s of distress.

## 2013-11-23 LAB — LIPASE, BLOOD: Lipase: 49 U/L (ref 11–59)

## 2013-11-23 MED ORDER — HYDROCODONE-ACETAMINOPHEN 5-325 MG PO TABS: 1.0000 | ORAL_TABLET | Freq: Four times a day (QID) | ORAL | Status: DC | PRN

## 2013-11-23 NOTE — Progress Notes (Signed)
Patient being d/c home with prescriptions. IV cath removed and intact. Verbalizes understanding of instructions. No pain/swelling at site.

## 2013-11-23 NOTE — Discharge Summary (Signed)
Physician Discharge Summary  ARJUN SANTAMARINA [id number] DOB: [birth year] DOA: 11/20/2013  PCP: Default, Provider, MD  Admit date: 11/20/2013 Discharge date: 11/23/2013  Time spent: 40 minutes  Recommendations for Outpatient Follow-up:  1. Followup primary care physician in the next one to 2 weeks.  Discharge Diagnoses:  Principal Problem:   Acute pancreatitis Active Problems:   Sciatica   Constipation   HTN (hypertension), benign   PTSD (post-traumatic stress disorder)   Discharge Condition: improved  Diet recommendation: low fat  Filed Weights   11/20/13 1813 11/20/13 2220  Weight: 89.812 kg (198 lb) 86.5 kg (190 lb 11.2 oz)    History of present illness:  Clinton Ross is a 48 y.o. male with a past medical history of posttraumatic stress disorder, chronic back pain with neuropathy, hypertension, who was in his usual state of health till this past Tuesday, when he started getting constipated. He hasn't had a bowel movement since then. And, then he started having abdominal pain in the upper abdomen radiating to the back and to the right. He went to see his primary care physician on Wednesday, and they were planning to schedule an US Abdomen but he hasn't had one yet. The pain got severe to 10 out of 10 in intensity. It was a stabbing pain and continuous. He was briefly helped by a heating pad. However, his symptoms got worse and so, he decided to come in to the hospital. He has been nauseous, but denies any vomiting. No fever. No chills. He's never had similar symptoms before. He's not had a colonoscopy yet. He was recently started on Depakote on Sunday for posttraumatic stress disorder. He took 3 doses of this medication and was asked to stop taking it by his primary care physician. His other home medications were started in December. After receiving pain medications in the emergency department patient is feeling some better.   Hospital Course:  Patient was admitted to the hospital  with abdominal pain. He was found to have acute pancreatitis. His symptoms began shortly after starting Depakote (after 3 doses). This was felt to possibly be playing a role in his illness. The patient denies any alcohol use. Right upper quadrant ultrasound was done which did show small gallstones and sludge. There was no thickening of the gallbladder or dilated common bile duct. Case was discussed with general surgery recommendations were for outpatient surgical evaluation for cholecystectomy. The patient obtained his primary care in the [place] [facility]. He will follow up with his primary care doctor and obtain referral to general surgeon. His symptoms have now resolved and he is tolerating solid diet. He is ready for discharge home. Would recommend that he stay off the Depakote for now and consider a new agent for PTSD.  Procedures:  none  Consultations:  none  Discharge Exam: Filed Vitals:   11/23/13 0952  BP: 104/64  Pulse:   Temp:   Resp:     General: NAD Cardiovascular: S1, s2, rrr Respiratory: CTA B  Discharge Instructions  Discharge Orders   Future Orders Complete By Expires   Call MD for:  persistant nausea and vomiting  As directed    Call MD for:  severe uncontrolled pain  As directed    Call MD for:  temperature >100.4  As directed    Diet - low sodium heart healthy  As directed    Increase activity slowly  As directed        Medication List    STOP taking  these medications       etodolac 500 MG tablet  Commonly known as:  LODINE      TAKE these medications       HYDROcodone-acetaminophen 5-325 MG per tablet  Commonly known as:  NORCO/VICODIN  Take 1-2 tablets by mouth every 6 (six) hours as needed for moderate pain.     lisinopril 40 MG tablet  Commonly known as:  PRINIVIL,ZESTRIL  Take 20 mg by mouth daily.     traMADol 50 MG tablet  Commonly known as:  ULTRAM  Take 50 mg by mouth every 6 (six) hours as needed. pain       No Known Allergies      Follow-up Information   Follow up with follow up with primary care doctor at the [facility] in the next 1-2 weeks.       The results of significant diagnostics from this hospitalization (including imaging, microbiology, ancillary and laboratory) are listed below for reference.    Significant Diagnostic Studies: US Abdomen Complete  11/21/2013   CLINICAL DATA:  Right upper quadrant tenderness. History of pancreatitis.  EXAM: ULTRASOUND ABDOMEN COMPLETE  COMPARISON:  CT ABD - PELV W/ CM dated 11/20/2013  FINDINGS: Gallbladder:  Sludge and small stones within. No pericholecystic fluid or wall thickening. Sonographic Murphy's sign was not elicited.  Common bile duct:  Diameter: Normal, 3 mm.  Liver:  No focal lesion identified. Within normal limits in parenchymal echogenicity.  IVC:  No abnormality visualized.  Pancreas:  Visualized portion unremarkable.  Spleen:  Size and appearance within normal limits.  Right Kidney:  Length: 11.1 cm. 1.9 cm lower pole lesion which corresponds with a cyst on yesterday's CT. No hydronephrosis.  Left Kidney:  Length: 11.2 cm. Echogenicity within normal limits. No mass or hydronephrosis visualized.  Abdominal aorta:  No aneurysm visualized.  Other findings:  None.  IMPRESSION: Gallbladder bones and sludge without acute cholecystitis or biliary ductal dilatation.   Electronically Signed   By: Abigail Miyamoto M.D.   On: 11/21/2013 12:18   Ct Abdomen Pelvis W Contrast  11/20/2013   CLINICAL DATA:  Abdominal pain for of the last several days. Dual bowel movement since pain began.  EXAM: CT ABDOMEN AND PELVIS WITH CONTRAST  TECHNIQUE: Multidetector CT imaging of the abdomen and pelvis was performed using the standard protocol following bolus administration of intravenous contrast.  CONTRAST:  63mL OMNIPAQUE IOHEXOL 300 MG/ML SOLN, 173mL OMNIPAQUE IOHEXOL 300 MG/ML SOLN  COMPARISON:  None.  FINDINGS: The margins of the pancreatic head are mildly ill-defined and there is subtle stranding  in the adjacent fat. There are prominent peripancreatic and gastrohepatic ligament lymph nodes. The largest is a posterior gastrohepatic ligament lymph node measuring 13 mm in short axis. There is a more dense oval mass or node just inferior to the tail the pancreas measuring 13 mm in short axis. There is no peripancreatic fluid. The pancreas enhances diffusely. The portal vein, splenic vein and superior mesenteric vein are widely patent.  Lung bases show minimal subsegmental atelectasis but are otherwise clear. The heart is normal in size.  Normal liver and spleen. Gallbladder is unremarkable. No dilation of the common bile duct. No adrenal masses.  2.6 cm cyst arises from the medial right kidney. Kidneys otherwise unremarkable. Normal ureters. Normal bladder.  No other adenopathy. No ascites. There are atherosclerotic changes along the abdominal aorta. No aneurysm.  There is mild increased stool burden in the right colon. There is no bowel wall thickening or mesenteric  inflammation. The small bowel is unremarkable. A normal appendix is visualized.  There are degenerative changes throughout the visualized spine. No osteoblastic or osteolytic lesions.  IMPRESSION: 1. Findings suggest mild pancreatitis if this correlates clinically. The appearance of the pancreas and adjacent mild peripancreatic and gastrohepatic ligament adenopathy may all be chronic, however. 2. Mild increased stool burden in the right colon. No evidence of bowel obstruction or bowel inflammation. Normal appendix is visualized. 3. Right renal cyst.  Degenerative changes of the visualized spine. 4. No other abnormalities.   Electronically Signed   By: Lajean Manes M.D.   On: 11/20/2013 20:27    Microbiology: No results found for this or any previous visit (from the past 240 hour(s)).   Labs: Basic Metabolic Panel:  Recent Labs Lab 11/20/13 1820 11/21/13 0521  NA 137 136*  K 4.4 4.0  CL 98 100  CO2 26 27  GLUCOSE 114* 88  BUN 18 16   CREATININE 0.95 0.91  CALCIUM 10.0 8.8   Liver Function Tests:  Recent Labs Lab 11/20/13 1820 11/21/13 0521  AST 17 15  ALT 19 16  ALKPHOS 115 90  BILITOT 0.3 0.5  PROT 8.0 6.4  ALBUMIN 3.8 3.1*    Recent Labs Lab 11/20/13 1820 11/21/13 0521 11/22/13 0553 11/23/13 0546  LIPASE 75* 99* 61* 49   No results found for this basename: AMMONIA,  in the last 168 hours CBC:  Recent Labs Lab 11/20/13 1820 11/21/13 0521  WBC 12.2* 9.4  NEUTROABS 8.8*  --   HGB 18.9* 16.4  HCT 52.3* 47.0  MCV 83.7 84.5  PLT 228 179   Cardiac Enzymes: No results found for this basename: CKTOTAL, CKMB, CKMBINDEX, TROPONINI,  in the last 168 hours BNP: BNP (last 3 results) No results found for this basename: PROBNP,  in the last 8760 hours CBG: No results found for this basename: GLUCAP,  in the last 168 hours     Signed:  Vona Whiters  [facility] 11/23/2013, 1:25 PM

## 2013-11-23 NOTE — Discharge Instructions (Signed)
Acute Pancreatitis °Acute pancreatitis is a disease in which the pancreas becomes suddenly inflamed. The pancreas is a large gland located behind your stomach. The pancreas produces enzymes that help digest food. The pancreas also releases the hormones glucagon and insulin that help regulate blood sugar. Damage to the pancreas occurs when the digestive enzymes from the pancreas are activated and begin attacking the pancreas before being released into the intestine. Most acute attacks last a couple of days and can cause serious complications. Some people become dehydrated and develop low blood pressure. In severe cases, bleeding into the pancreas can lead to shock and can be life-threatening. The lungs, heart, and kidneys may fail. °CAUSES  °Pancreatitis can happen to anyone. In some cases, the cause is unknown. Most cases are caused by: °· Alcohol abuse. °· Gallstones. °Other less common causes are: °· Certain medicines. °· Exposure to certain chemicals. °· Infection. °· Damage caused by an accident (trauma). °· Abdominal surgery. °SYMPTOMS  °· Pain in the upper abdomen that may radiate to the back. °· Tenderness and swelling of the abdomen. °· Nausea and vomiting. °DIAGNOSIS  °Your caregiver will perform a physical exam. Blood and stool tests may be done to confirm the diagnosis. Imaging tests may also be done, such as X-rays, CT scans, or an ultrasound of the abdomen. °TREATMENT  °Treatment usually requires a stay in the hospital. Treatment may include: °· Pain medicine. °· Fluid replacement through an intravenous line (IV). °· Placing a tube in the stomach to remove stomach contents and control vomiting. °· Not eating for 3 or 4 days. This gives your pancreas a rest, because enzymes are not being produced that can cause further damage. °· Antibiotic medicines if your condition is caused by an infection. °· Surgery of the pancreas or gallbladder. °HOME CARE INSTRUCTIONS  °· Follow the diet advised by your  caregiver. This may involve avoiding alcohol and decreasing the amount of fat in your diet. °· Eat smaller, more frequent meals. This reduces the amount of digestive juices the pancreas produces. °· Drink enough fluids to keep your urine clear or pale yellow. °· Only take over-the-counter or prescription medicines as directed by your caregiver. °· Avoid drinking alcohol if it caused your condition. °· Do not smoke. °· Get plenty of rest. °· Check your blood sugar at home as directed by your caregiver. °· Keep all follow-up appointments as directed by your caregiver. °SEEK MEDICAL CARE IF:  °· You do not recover as quickly as expected. °· You develop new or worsening symptoms. °· You have persistent pain, weakness, or nausea. °· You recover and then have another episode of pain. °SEEK IMMEDIATE MEDICAL CARE IF:  °· You are unable to eat or keep fluids down. °· Your pain becomes severe. °· You have a fever or persistent symptoms for more than 2 to 3 days. °· You have a fever and your symptoms suddenly get worse. °· Your skin or the white part of your eyes turn yellow (jaundice). °· You develop vomiting. °· You feel dizzy, or you faint. °· Your blood sugar is high (over 300 mg/dL). °MAKE SURE YOU:  °· Understand these instructions. °· Will watch your condition. °· Will get help right away if you are not doing well or get worse. °Document Released: 09/09/2005 Document Revised: 03/10/2012 Document Reviewed: 12/19/2011 °[employer]® Patient Information ©2014 [employer]. ° °

## 2013-11-27 ENCOUNTER — Inpatient Hospital Stay (HOSPITAL_COMMUNITY)
Admission: EM | Admit: 2013-11-27 | Discharge: 2013-11-29 | DRG: 440 | Disposition: A | Discharge status: Other Acute Inpatient Hospital | Payer: Non-veteran care | Attending: Internal Medicine | Admitting: Internal Medicine

## 2013-11-27 ENCOUNTER — Encounter (HOSPITAL_COMMUNITY): Payer: Self-pay | Admitting: Emergency Medicine

## 2013-11-27 ENCOUNTER — Emergency Department (HOSPITAL_COMMUNITY): Payer: Non-veteran care

## 2013-11-27 DIAGNOSIS — F4310 Post-traumatic stress disorder, unspecified: Secondary | ICD-10-CM

## 2013-11-27 DIAGNOSIS — K560 Paralytic ileus: Secondary | ICD-10-CM

## 2013-11-27 DIAGNOSIS — I10 Essential (primary) hypertension: Secondary | ICD-10-CM

## 2013-11-27 DIAGNOSIS — M25569 Pain in unspecified knee: Secondary | ICD-10-CM

## 2013-11-27 DIAGNOSIS — Z823 Family history of stroke: Secondary | ICD-10-CM

## 2013-11-27 DIAGNOSIS — M545 Low back pain, unspecified: Secondary | ICD-10-CM | POA: Diagnosis present

## 2013-11-27 DIAGNOSIS — K861 Other chronic pancreatitis: Secondary | ICD-10-CM | POA: Diagnosis present

## 2013-11-27 DIAGNOSIS — Z833 Family history of diabetes mellitus: Secondary | ICD-10-CM

## 2013-11-27 DIAGNOSIS — M25469 Effusion, unspecified knee: Secondary | ICD-10-CM

## 2013-11-27 DIAGNOSIS — Z720 Tobacco use: Secondary | ICD-10-CM | POA: Diagnosis present

## 2013-11-27 DIAGNOSIS — K859 Acute pancreatitis without necrosis or infection, unspecified: Principal | ICD-10-CM

## 2013-11-27 DIAGNOSIS — Z8249 Family history of ischemic heart disease and other diseases of the circulatory system: Secondary | ICD-10-CM

## 2013-11-27 DIAGNOSIS — M23302 Other meniscus derangements, unspecified lateral meniscus, unspecified knee: Secondary | ICD-10-CM

## 2013-11-27 DIAGNOSIS — F17200 Nicotine dependence, unspecified, uncomplicated: Secondary | ICD-10-CM | POA: Diagnosis present

## 2013-11-27 DIAGNOSIS — K567 Ileus, unspecified: Secondary | ICD-10-CM

## 2013-11-27 DIAGNOSIS — K8020 Calculus of gallbladder without cholecystitis without obstruction: Secondary | ICD-10-CM | POA: Diagnosis present

## 2013-11-27 DIAGNOSIS — G8929 Other chronic pain: Secondary | ICD-10-CM | POA: Diagnosis present

## 2013-11-27 DIAGNOSIS — M5430 Sciatica, unspecified side: Secondary | ICD-10-CM

## 2013-11-27 DIAGNOSIS — R109 Unspecified abdominal pain: Secondary | ICD-10-CM

## 2013-11-27 DIAGNOSIS — K5900 Constipation, unspecified: Secondary | ICD-10-CM | POA: Diagnosis present

## 2013-11-27 HISTORY — DX: Calculus of gallbladder without cholecystitis without obstruction: K80.20

## 2013-11-27 HISTORY — DX: Post-traumatic stress disorder, unspecified: F43.10

## 2013-11-27 HISTORY — DX: Acute pancreatitis without necrosis or infection, unspecified: K85.90

## 2013-11-27 HISTORY — DX: Sciatica, unspecified side: M54.30

## 2013-11-27 LAB — COMPREHENSIVE METABOLIC PANEL
ALT: 100 U/L — ABNORMAL HIGH (ref 0–53)
AST: 36 U/L (ref 0–37)
Albumin: 3.7 g/dL (ref 3.5–5.2)
Alkaline Phosphatase: 130 U/L — ABNORMAL HIGH (ref 39–117)
BILIRUBIN TOTAL: 0.2 mg/dL — AB (ref 0.3–1.2)
BUN: 11 mg/dL (ref 6–23)
CO2: 25 meq/L (ref 19–32)
CREATININE: 0.91 mg/dL (ref 0.50–1.35)
Calcium: 9.8 mg/dL (ref 8.4–10.5)
Chloride: 99 mEq/L (ref 96–112)
GLUCOSE: 91 mg/dL (ref 70–99)
Potassium: 4.1 mEq/L (ref 3.7–5.3)
Sodium: 136 mEq/L — ABNORMAL LOW (ref 137–147)
Total Protein: 7.1 g/dL (ref 6.0–8.3)

## 2013-11-27 LAB — CBC WITH DIFFERENTIAL/PLATELET
Basophils Absolute: 0.1 10*3/uL (ref 0.0–0.1)
Basophils Relative: 1 % (ref 0–1)
EOS PCT: 3 % (ref 0–5)
Eosinophils Absolute: 0.3 10*3/uL (ref 0.0–0.7)
HEMATOCRIT: 48.5 % (ref 39.0–52.0)
HEMOGLOBIN: 17.3 g/dL — AB (ref 13.0–17.0)
LYMPHS ABS: 2.5 10*3/uL (ref 0.7–4.0)
LYMPHS PCT: 27 % (ref 12–46)
MCH: 29.9 pg (ref 26.0–34.0)
MCHC: 35.7 g/dL (ref 30.0–36.0)
MCV: 83.8 fL (ref 78.0–100.0)
MONO ABS: 0.7 10*3/uL (ref 0.1–1.0)
MONOS PCT: 8 % (ref 3–12)
Neutro Abs: 5.9 10*3/uL (ref 1.7–7.7)
Neutrophils Relative %: 63 % (ref 43–77)
Platelets: 230 10*3/uL (ref 150–400)
RBC: 5.79 MIL/uL (ref 4.22–5.81)
RDW: 13 % (ref 11.5–15.5)
WBC: 9.5 10*3/uL (ref 4.0–10.5)

## 2013-11-27 LAB — ETHANOL

## 2013-11-27 LAB — LIPASE, BLOOD: Lipase: 77 U/L — ABNORMAL HIGH (ref 11–59)

## 2013-11-27 IMAGING — CT CT ABD-PELV W/ CM
2 of 4 series · 15 of 46 positions shown, 17 images · IV contrast (Omnipaque 300)
Comparison: [DATE]

CLINICAL DATA: Right upper quadrant pain for several weeks. History
of pancreatitis.

EXAM:
CT ABDOMEN AND PELVIS WITH CONTRAST
TECHNIQUE: Multidetector CT imaging of the abdomen and pelvis was performed
using the standard protocol following bolus administration of
intravenous contrast.
CONTRAST:  100mL OMNIPAQUE IOHEXOL 300 MG/ML SOLN, 50mL OMNIPAQUE
IOHEXOL 300 MG/ML SOLN

[Series 2: abd_pel_with 5.0 b40f · axial · 0.76mm/px · z∈[-474,-34]mm · 12 of 98 slices shown, 14 images]
[im 5/98  soft-tissue]
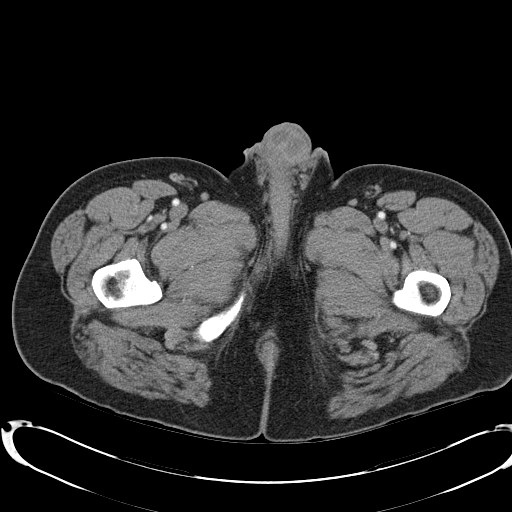
[im 5/98  bone]
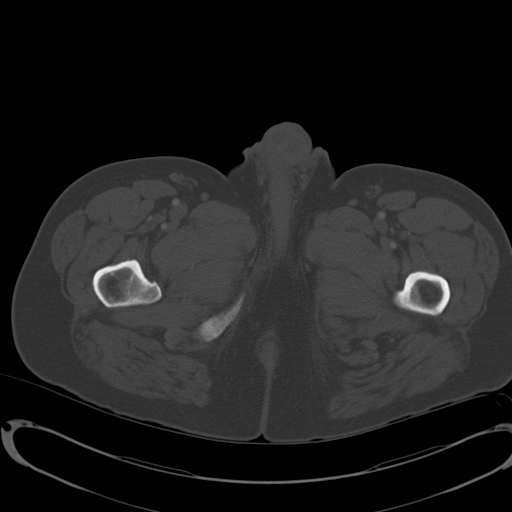
[im 14/98  soft-tissue]
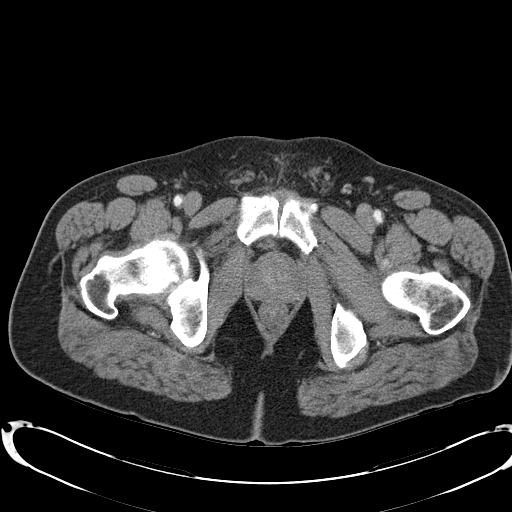
[im 24/98  soft-tissue]
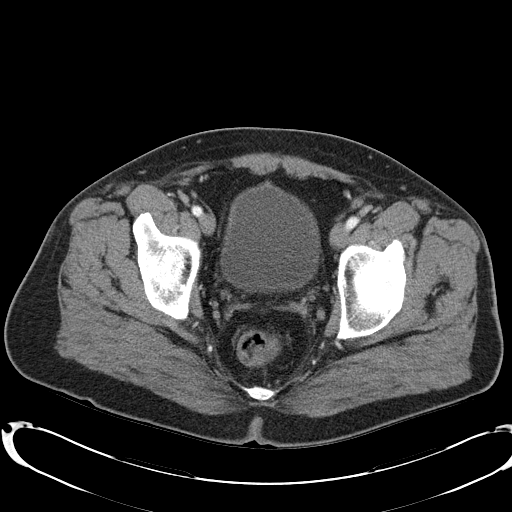
[im 28/98  soft-tissue]
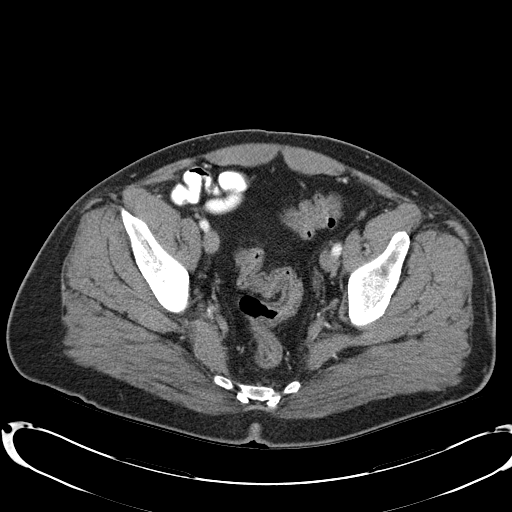
[im 37/98  soft-tissue]
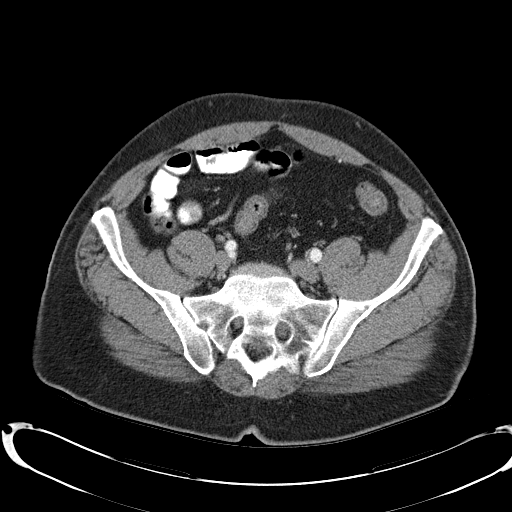
[im 47/98  soft-tissue]
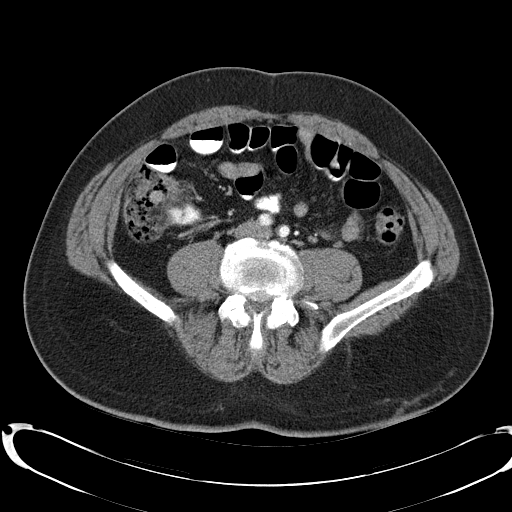
[im 51/98  soft-tissue]
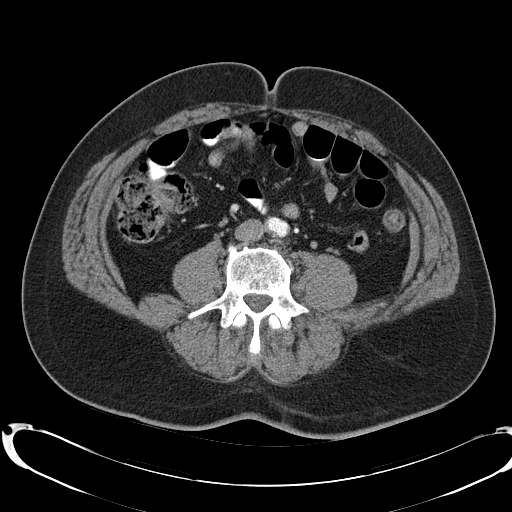
[im 61/98  soft-tissue]
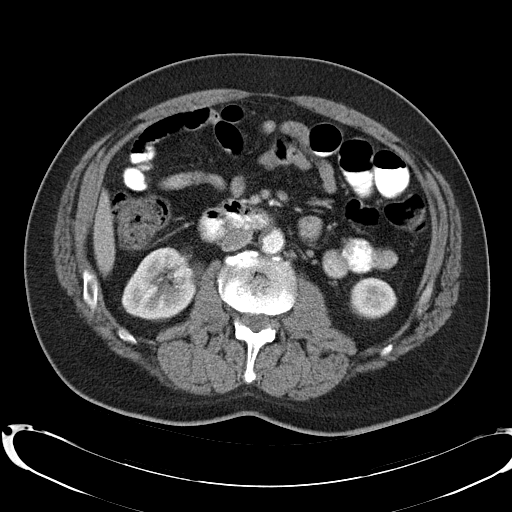
[im 70/98  soft-tissue]
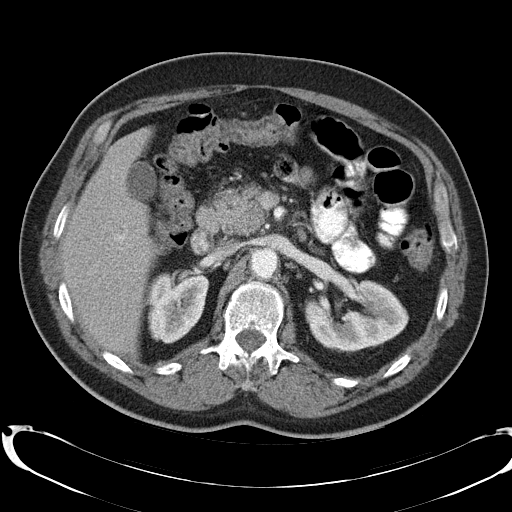
[im 70/98  bone]
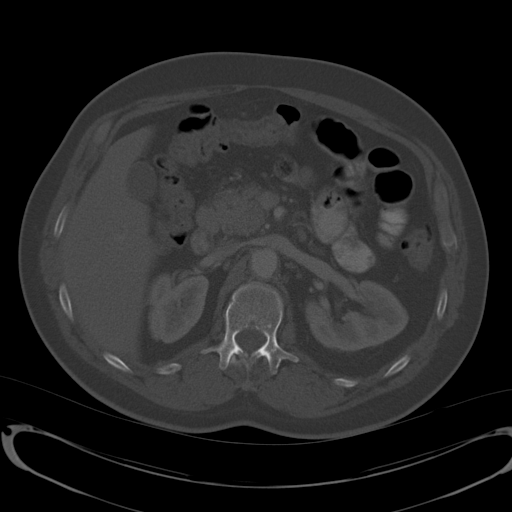
[im 74/98  soft-tissue]
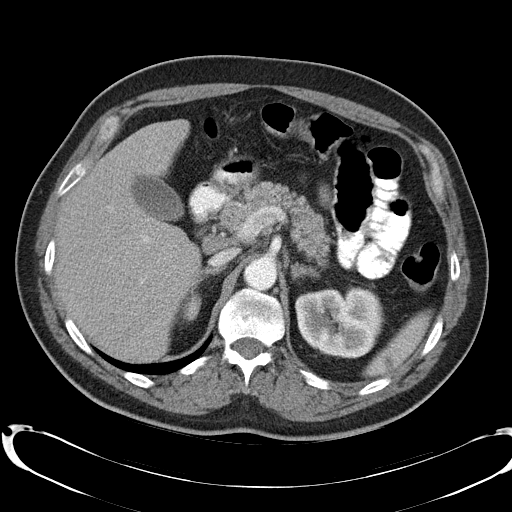
[im 84/98  soft-tissue]
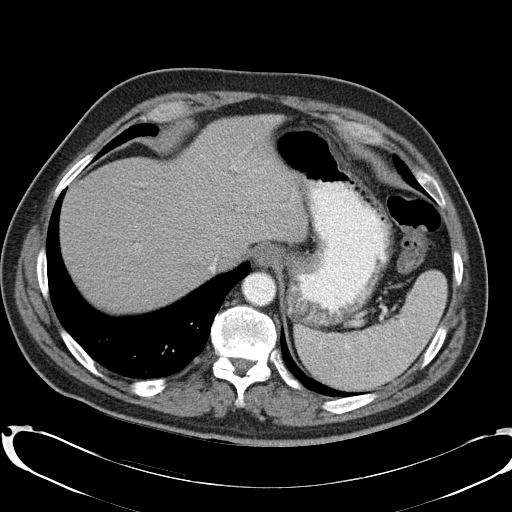
[im 93/98  soft-tissue]
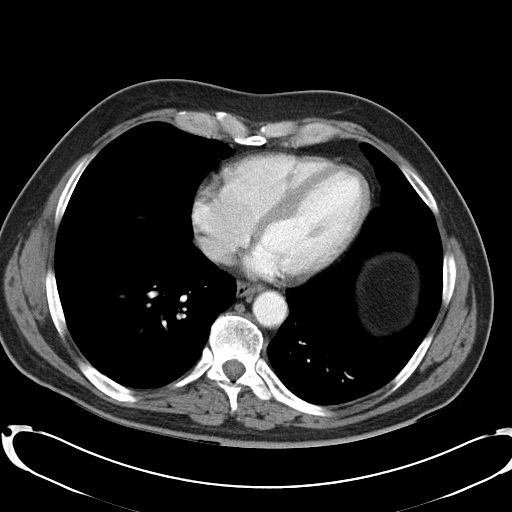

[Series 4: abd_pel_with 3.0 spo cor · coronal · 0.66mm/px · 3 of 99 slices shown]
[im 33/99  soft-tissue]
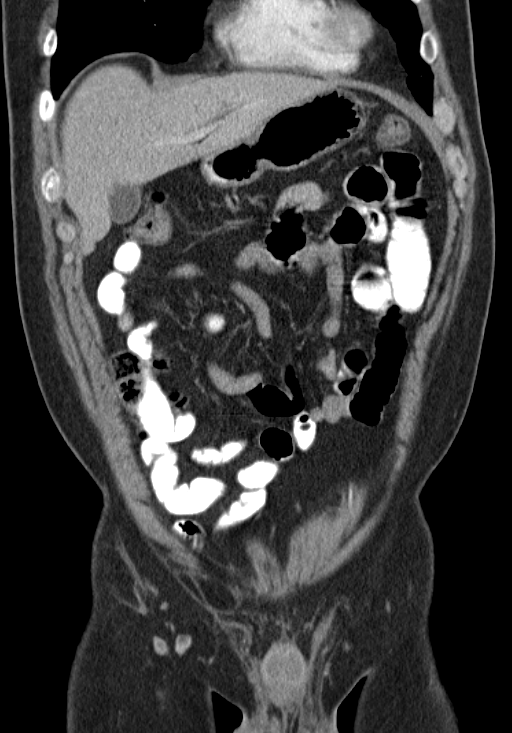
[im 44/99  soft-tissue]
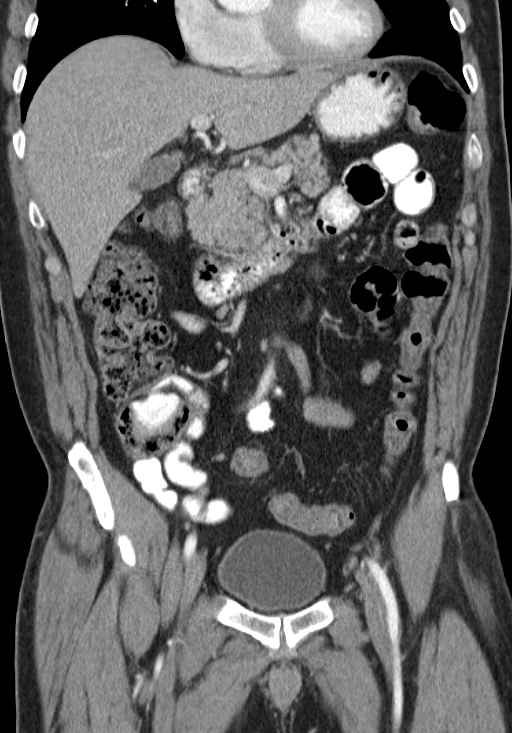
[im 55/99  soft-tissue]
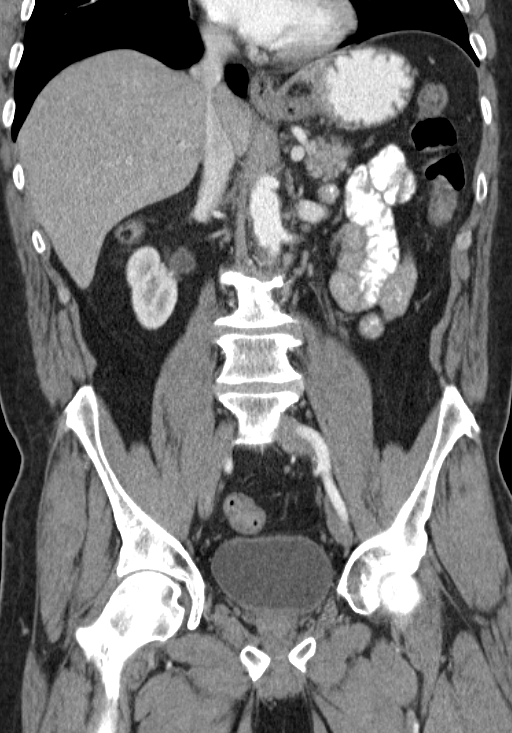

[15 of 46 positions shown; findings below may reference images not displayed]

FINDINGS: Pancreatic head appears mildly swollen with its margins slightly
ill-defined. There is subtle stranding in the adjacent fat. This
appearance is stable from the recent prior CT. Although this could
be chronic, in again suggests mild uncomplicated pancreatitis. There
is a small oval soft tissue mass along the inferior margin of the
pancreatic tail that measures 18 mm x 13 mm, unchanged. There is a
13 mm posterior gastrohepatic ligament lymph nodes also stable.

There is minimal subsegmental atelectasis at the lung bases. The
heart is normal in size. Liver and spleen are unremarkable. No bile
duct dilation. Normal gallbladder. No adrenal masses. Stable medial
right midpole renal cyst. Kidneys otherwise unremarkable. Normal
ureters. Normal bladder. There are atherosclerotic changes along the
abdominal aorta. No aneurysm. There are There are several left colon
diverticula. No diverticulitis. Mild prominence of the proximal
small bowel is noted, which may be a mild adynamic ileus. There are
associated air-fluid levels. No bowel wall thickening. No mesenteric
inflammation. Normal appendix is visualized.

Stable degenerative changes noted of the visualized spine.
IMPRESSION: 1. Prominence of the pancreatic head with subtle adjacent fat
stranding again suggests mild pancreatitis. The stability of this
appearance, however, suggests that it may be chronic.
2. Mild gastrohepatic and peripancreatic adenopathy is also stable.
3. Mild prominence of the proximal small bowel with small bowel
air-fluid levels. This may reflect a mild adynamic ileus. No bowel
inflammatory changes. No other evidence of an acute abnormality.

## 2013-11-27 IMAGING — CR DG ABDOMEN ACUTE W/ 1V CHEST
3 series · 3 of 3 positions shown · non-contrast
Comparison: CT, [DATE]

CLINICAL DATA: Abdominal pain.

EXAM:
ACUTE ABDOMEN SERIES (ABDOMEN 2 VIEW & CHEST 1 VIEW)

[view not recorded (1 of 3)]
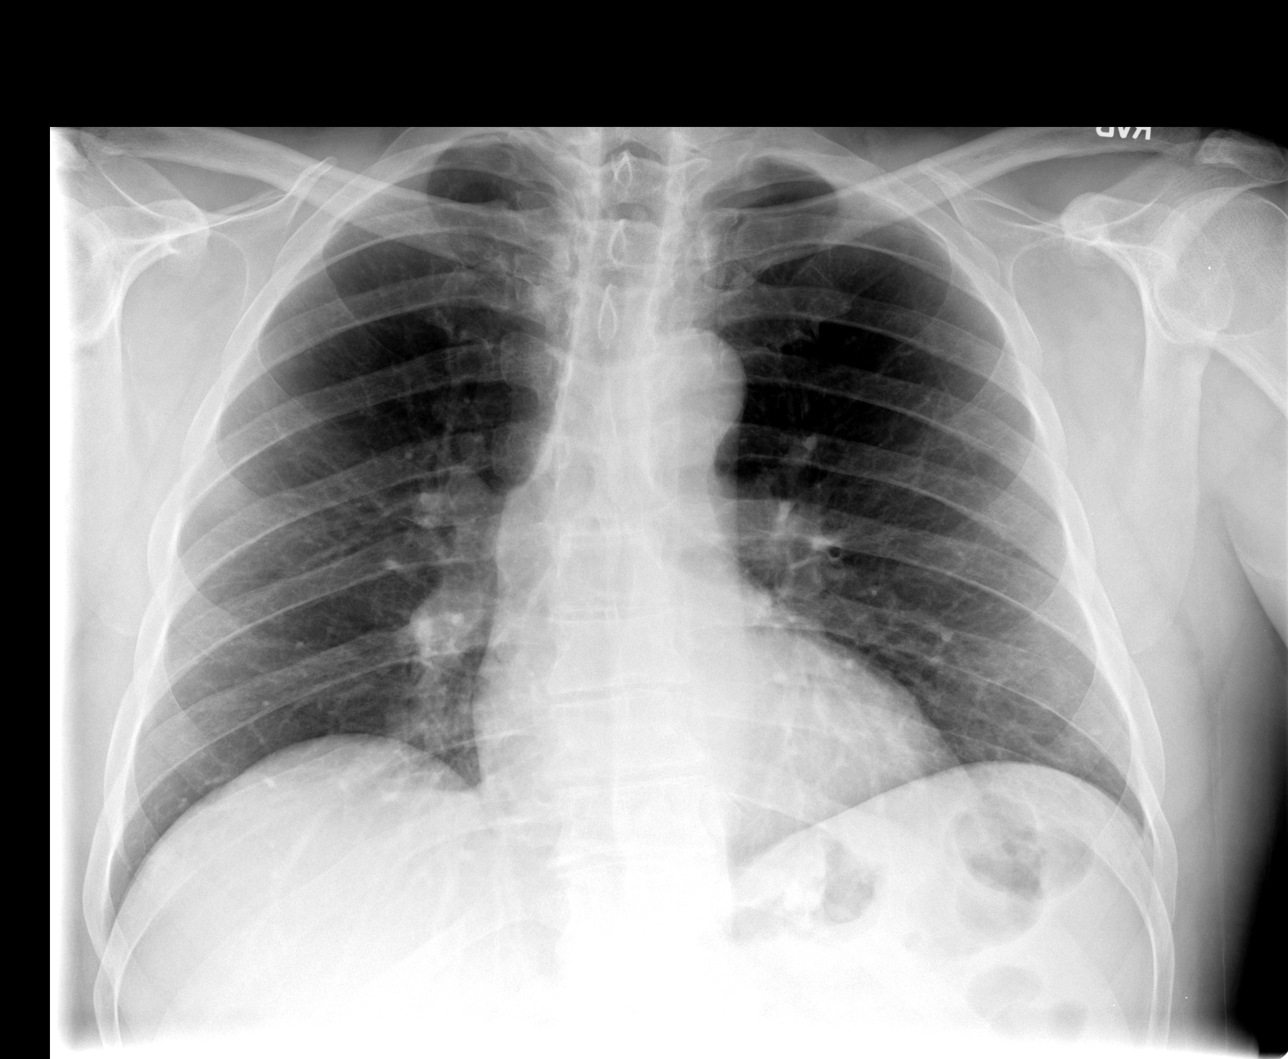

[view not recorded (2 of 3)]
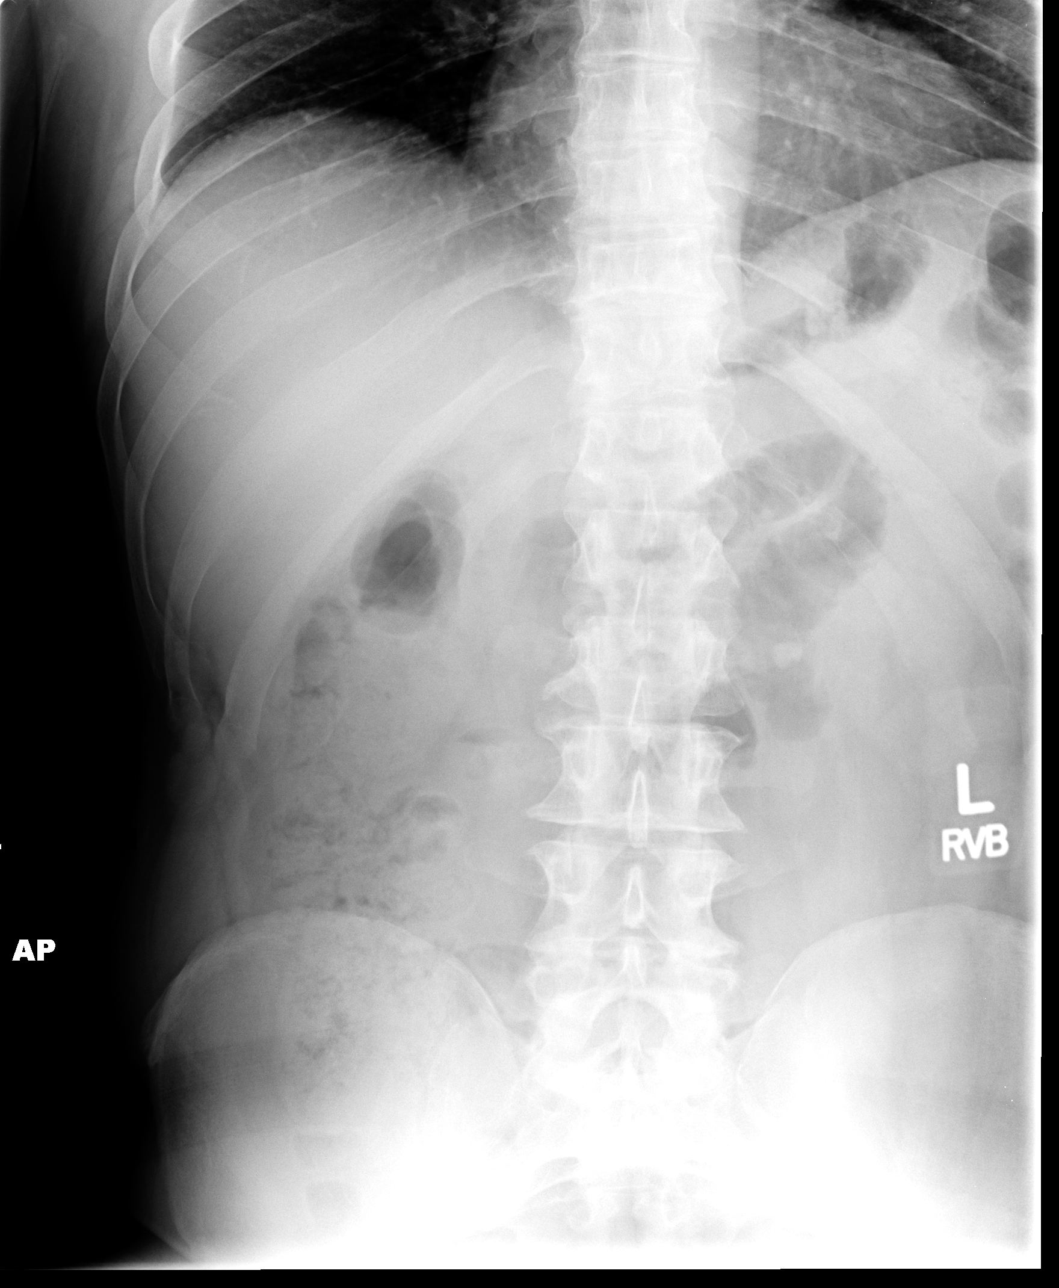

[view not recorded (3 of 3)]
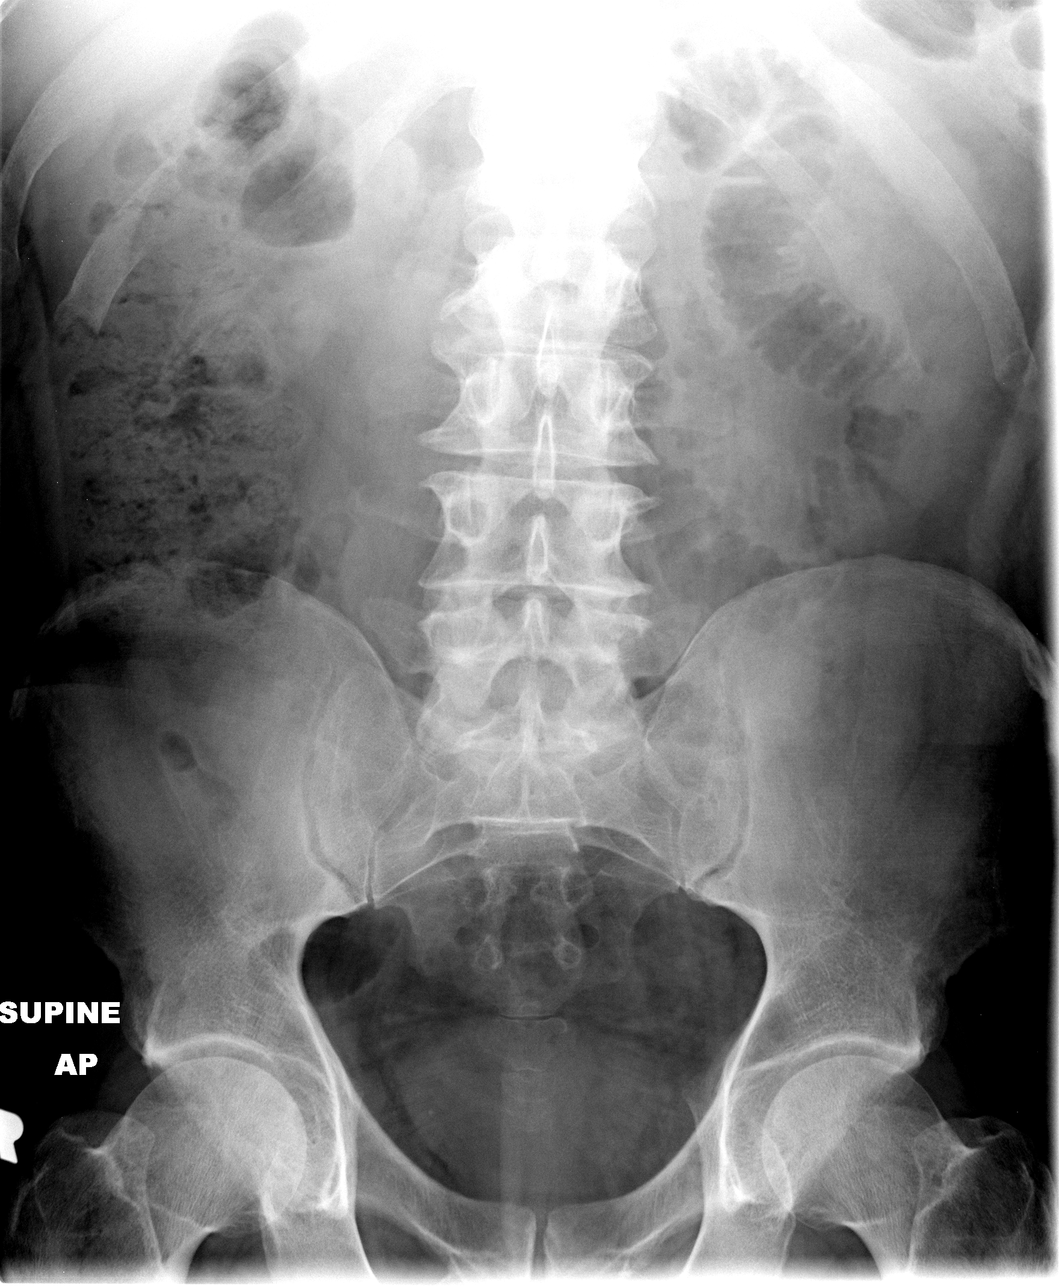

[3 of 3 positions shown; findings below may reference images not displayed]

FINDINGS: Normal bowel gas pattern. No evidence of obstruction. No free air.
An oval density projects to the left of the pole to vertebrae, seen
on the upright image only but not on the supine image. This is
equivocal. It may have been a superimposed shadow. There is no
convincing renal or ureteral stone.

Soft tissues are otherwise unremarkable.

Lungs are clear.  Normal heart, mediastinum and hila.

Degenerative changes are noted of the thoracolumbar spine.
IMPRESSION: 1. No acute findings. No obstruction, generalized adynamic ileus or
free air. No active disease on the chest radiograph.

## 2013-11-27 MED ORDER — ENOXAPARIN SODIUM 40 MG/0.4ML ~~LOC~~ SOLN
40.0000 mg | SUBCUTANEOUS | Status: DC
  Administered 2013-11-28 – 2013-11-29 (×2): 40 mg via SUBCUTANEOUS
  Filled 2013-11-27 (×2): qty 0.4

## 2013-11-27 MED ORDER — SODIUM CHLORIDE 0.9 % IV SOLN
INTRAVENOUS | Status: DC
  Administered 2013-11-28: 125 mL via INTRAVENOUS
  Administered 2013-11-28: via INTRAVENOUS

## 2013-11-27 MED ORDER — ONDANSETRON HCL 4 MG/2ML IJ SOLN: 4.0000 mg | Freq: Four times a day (QID) | INTRAMUSCULAR | Status: DC | PRN

## 2013-11-27 MED ORDER — ONDANSETRON HCL 4 MG/2ML IJ SOLN: 4.0000 mg | Freq: Three times a day (TID) | INTRAMUSCULAR | Status: DC | PRN

## 2013-11-27 MED ORDER — ONDANSETRON HCL 4 MG/2ML IJ SOLN
4.0000 mg | INTRAMUSCULAR | Status: DC | PRN
  Administered 2013-11-27: 4 mg via INTRAVENOUS
  Filled 2013-11-27: qty 2

## 2013-11-27 MED ORDER — SODIUM CHLORIDE 0.9 % IV SOLN: INTRAVENOUS | Status: DC

## 2013-11-27 MED ORDER — IOHEXOL 300 MG/ML  SOLN
100.0000 mL | Freq: Once | INTRAMUSCULAR | Status: AC | PRN
  Administered 2013-11-27: 100 mL via INTRAVENOUS

## 2013-11-27 MED ORDER — HYDROMORPHONE HCL PF 1 MG/ML IJ SOLN
1.0000 mg | INTRAMUSCULAR | Status: DC | PRN
  Administered 2013-11-28 – 2013-11-29 (×7): 1 mg via INTRAVENOUS
  Filled 2013-11-27 (×7): qty 1

## 2013-11-27 MED ORDER — SODIUM CHLORIDE 0.9 % IV SOLN
INTRAVENOUS | Status: DC
  Administered 2013-11-27: 17:00:00 via INTRAVENOUS

## 2013-11-27 MED ORDER — IOHEXOL 300 MG/ML  SOLN
50.0000 mL | Freq: Once | INTRAMUSCULAR | Status: AC | PRN
  Administered 2013-11-27: 50 mL via ORAL

## 2013-11-27 MED ORDER — HYDROMORPHONE HCL PF 1 MG/ML IJ SOLN: 1.0000 mg | INTRAMUSCULAR | Status: DC | PRN

## 2013-11-27 MED ORDER — ONDANSETRON HCL 4 MG PO TABS: 4.0000 mg | ORAL_TABLET | Freq: Four times a day (QID) | ORAL | Status: DC | PRN

## 2013-11-27 MED ORDER — MORPHINE SULFATE 4 MG/ML IJ SOLN
4.0000 mg | INTRAMUSCULAR | Status: AC | PRN
  Administered 2013-11-27 (×2): 4 mg via INTRAVENOUS
  Filled 2013-11-27 (×2): qty 1

## 2013-11-27 MED ORDER — LISINOPRIL 10 MG PO TABS
20.0000 mg | ORAL_TABLET | Freq: Every day | ORAL | Status: DC
  Administered 2013-11-28 – 2013-11-29 (×2): 20 mg via ORAL
  Filled 2013-11-27 (×2): qty 2

## 2013-11-27 NOTE — H&P (Signed)
Chief Complaint:  abd pain  HPI: 48 yo male h/o ptsd, htn comes in with second episode of epigastric and ruq abd pain started tonight and progressively worsening,  No n/v.  No fevers.  Pt was just d/c from her last week with same, acute pancreatitis thought to be due to depakote, but recommended to have his gallbladder removed.  [facility].  Has not found that the pain is better or worse with eating.  Review of Systems:  Positive and negative as per HPI otherwise all other systems are negative  Past Medical History: Past Medical History  Diagnosis Date  . Hypertension   . Bulging disc   . Acute pancreatitis 10/2013  . Gallstones   . Sciatica   . PTSD (post-traumatic stress disorder)    History reviewed. No pertinent past surgical history.  Medications: Prior to Admission medications   Medication Sig Start Date End Date Taking? Authorizing Provider  HYDROcodone-acetaminophen (NORCO/VICODIN) 5-325 MG per tablet Take 1-2 tablets by mouth every 6 (six) hours as needed for moderate pain. 11/23/13  Yes Kathie Dike, MD  lisinopril (PRINIVIL,ZESTRIL) 40 MG tablet Take 20 mg by mouth daily.   Yes Historical Provider, MD  traMADol (ULTRAM) 50 MG tablet Take 50 mg by mouth every 6 (six) hours as needed. pain   Yes Historical Provider, MD    Allergies:  No Known Allergies  Social History:  reports that he has been smoking Cigarettes.  He has a 30 pack-year smoking history. He has never used smokeless tobacco. He reports that he does not drink alcohol or use illicit drugs.  Family History: Family History  Problem Relation Age of Onset  . Hypertension Mother   . Stroke Father   . Diabetes Other     Physical Exam: Filed Vitals:   11/27/13 1603 11/27/13 1939  BP: 97/73 111/67  Pulse: 79 69  Temp: 98.1 F (36.7 C)   TempSrc: Oral   Resp: 18 18  Height: $Remove'5\' 10"'FLJnPoo$  (1.778 m)   Weight: 89.812 kg (198 lb)   SpO2: 95% 97%   General appearance:  alert, cooperative and no distress Head: Normocephalic, without obvious abnormality, atraumatic Eyes: negative Nose: Nares normal. Septum midline. Mucosa normal. No drainage or sinus tenderness. Neck: no JVD and supple, symmetrical, trachea midline Lungs: clear to auscultation bilaterally Heart: regular rate and rhythm, S1, S2 normal, no murmur, click, rub or gallop Abdomen: soft, non-tender; bowel sounds normal; no masses,  no organomegaly Extremities: extremities normal, atraumatic, no cyanosis or edema Pulses: 2+ and symmetric Skin: Skin color, texture, turgor normal. No rashes or lesions Neurologic: Grossly normal    Labs on Admission:   Recent Labs  11/27/13 1639  NA 136*  K 4.1  CL 99  CO2 25  GLUCOSE 91  BUN 11  CREATININE 0.91  CALCIUM 9.8    Recent Labs  11/27/13 1639  AST 36  ALT 100*  ALKPHOS 130*  BILITOT 0.2*  PROT 7.1  ALBUMIN 3.7    Recent Labs  11/27/13 1639  LIPASE 77*    Recent Labs  11/27/13 1639  WBC 9.5  NEUTROABS 5.9  HGB 17.3*  HCT 48.5  MCV 83.8  PLT 230    Radiological Exams on Admission: US Abdomen Complete  11/21/2013   CLINICAL DATA:  Right upper quadrant tenderness. History of pancreatitis.  EXAM: ULTRASOUND ABDOMEN COMPLETE  COMPARISON:  CT ABD - PELV W/ CM dated 11/20/2013  FINDINGS: Gallbladder:  Sludge and small stones within. No pericholecystic fluid or wall thickening. Sonographic Murphy's sign was not elicited.  Common bile duct:  Diameter: Normal, 3 mm.  Liver:  No focal lesion identified. Within normal limits in parenchymal echogenicity.  IVC:  No abnormality visualized.  Pancreas:  Visualized portion unremarkable.  Spleen:  Size and appearance within normal limits.  Right Kidney:  Length: 11.1 cm. 1.9 cm lower pole lesion which corresponds with a cyst on yesterday's CT. No hydronephrosis.  Left Kidney:  Length: 11.2 cm. Echogenicity within normal limits. No mass or hydronephrosis visualized.  Abdominal aorta:  No  aneurysm visualized.  Other findings:  None.  IMPRESSION: Gallbladder bones and sludge without acute cholecystitis or biliary ductal dilatation.   Electronically Signed   By: Jeronimo Greaves M.D.   On: 11/21/2013 12:18   Ct Abdomen Pelvis W Contrast  11/27/2013   CLINICAL DATA:  Right upper quadrant pain for several weeks. History of pancreatitis.  EXAM: CT ABDOMEN AND PELVIS WITH CONTRAST  TECHNIQUE: Multidetector CT imaging of the abdomen and pelvis was performed using the standard protocol following bolus administration of intravenous contrast.  CONTRAST:  OMNIPAQUE IOHEXOL 300 MG/ML SOLN, 88mL OMNIPAQUE IOHEXOL 300 MG/ML SOLN  COMPARISON:  11/20/2013  FINDINGS: Pancreatic head appears mildly swollen with its margins slightly ill-defined. There is subtle stranding in the adjacent fat. This appearance is stable from the recent prior CT. Although this could be chronic, in again suggests mild uncomplicated pancreatitis. There is a small oval soft tissue mass along the inferior margin of the pancreatic tail that measures 18 mm x 13 mm, unchanged. There is a 13 mm posterior gastrohepatic ligament lymph nodes also stable.  There is minimal subsegmental atelectasis at the lung bases. The heart is normal in size. Liver and spleen are unremarkable. No bile duct dilation. Normal gallbladder. No adrenal masses. Stable medial right midpole renal cyst. Kidneys otherwise unremarkable. Normal ureters. Normal bladder. There are atherosclerotic changes along the abdominal aorta. No aneurysm. There are There are several left colon diverticula. No diverticulitis. Mild prominence of the proximal small bowel is noted, which may be a mild adynamic ileus. There are associated air-fluid levels. No bowel wall thickening. No mesenteric inflammation. Normal appendix is visualized.  Stable degenerative changes noted of the visualized spine.  IMPRESSION: 1. Prominence of the pancreatic head with subtle adjacent fat stranding again  suggests mild pancreatitis. The stability of this appearance, however, suggests that it may be chronic. 2. Mild gastrohepatic and peripancreatic adenopathy is also stable. 3. Mild prominence of the proximal small bowel with small bowel air-fluid levels. This may reflect a mild adynamic ileus. No bowel inflammatory changes. No other evidence of an acute abnormality.   Electronically Signed   By: Amie Portland M.D.   On: 11/27/2013 19:04   Dg Abd Acute W/chest  11/27/2013   CLINICAL DATA:  Abdominal pain.  EXAM: ACUTE ABDOMEN SERIES (ABDOMEN 2 VIEW & CHEST 1 VIEW)  COMPARISON:  CT, 11/20/2013  FINDINGS: Normal bowel gas pattern. No evidence of obstruction. No free air. An oval density projects to the left of the pole to vertebrae, seen on the upright image only but not on the supine image. This is equivocal. It may have been a superimposed shadow. There is no convincing renal or ureteral stone.  Soft tissues are otherwise unremarkable.  Lungs are clear.  Normal heart, mediastinum and hila.  Degenerative changes are noted of the thoracolumbar spine.  IMPRESSION: 1. No acute findings. No obstruction, generalized  adynamic ileus or free air. No active disease on the chest radiograph.   Electronically Signed   By: Lajean Manes M.D.   On: 11/27/2013 17:04    Assessment/Plan  48 yo male with second episode of acute pancreatitis in the last month likely due to gallbladder disease  Principal Problem:   Acute pancreatitis-  Ivf.  Iv pain meds.  Npo, consult general surgery.  abd exam is benign.  Alt is elevated but ast /alk phos nml.  Will not repeat any imaging, will defer to gen surgery.  Trig level was good last time.  Active Problems:   HTN (hypertension), benign   PTSD (post-traumatic stress disorder)   Abdominal pain  FULL CODE  DAVID,RACHAL A 11/27/2013, 7:43 PM

## 2013-11-27 NOTE — ED Provider Notes (Signed)
CSN: [id number]     Arrival date & time 11/27/13  1546 History   First MD Initiated Contact with Patient 11/27/13 1630     Chief Complaint  Patient presents with  . Abdominal Pain     HPI Pt was seen at 1635.  Per pt and his family, c/o gradual onset and persistence of constant generalized abd "pain" since last night.  Describes the abd pain as "throbbing," located diffusely, but esp in his RUQ. States he had similar symptoms 1 week ago, and was admitted to the hospital for dx gallstones, pancreatitis and constipation. Pt was rx vicodin, which he has been taking with minimal improvement in his pain.  Denies N/V, no diarrhea, no fevers, no back pain, no rash, no CP/SOB, no black or blood in stools.        Past Medical History  Diagnosis Date  . Hypertension   . Bulging disc   . Acute pancreatitis 10/2013  . Gallstones   . Sciatica   . PTSD (post-traumatic stress disorder)    History reviewed. No pertinent past surgical history.   Family History  Problem Relation Age of Onset  . Hypertension Mother   . Stroke Father   . Diabetes Other    History  Substance Use Topics  . Smoking status: Current Every Day Smoker -- 1.00 packs/day for 30 years    Types: Cigarettes  . Smokeless tobacco: Never Used  . Alcohol Use: No     Comment: occasional    Review of Systems ROS: Statement: All systems negative except as marked or noted in the HPI; Constitutional: Negative for fever and chills. ; ; Eyes: Negative for eye pain, redness and discharge. ; ; ENMT: Negative for ear pain, hoarseness, nasal congestion, sinus pressure and sore throat. ; ; Cardiovascular: Negative for chest pain, palpitations, diaphoresis, dyspnea and peripheral edema. ; ; Respiratory: Negative for cough, wheezing and stridor. ; ; Gastrointestinal: +abd pain. Negative for nausea, vomiting, diarrhea, blood in stool, hematemesis, jaundice and rectal bleeding. . ; ; Genitourinary: Negative for dysuria, flank pain and hematuria.  ; ; Musculoskeletal: Negative for back pain and neck pain. Negative for swelling and trauma.; ; Skin: Negative for pruritus, rash, abrasions, blisters, bruising and skin lesion.; ; Neuro: Negative for headache, lightheadedness and neck stiffness. Negative for weakness, altered level of consciousness , altered mental status, extremity weakness, paresthesias, involuntary movement, seizure and syncope.      Allergies  Review of patient's allergies indicates no known allergies.  Home Medications   Current Outpatient Rx  Name  Route  Sig  Dispense  Refill  . HYDROcodone-acetaminophen (NORCO/VICODIN) 5-325 MG per tablet   Oral   Take 1-2 tablets by mouth every 6 (six) hours as needed for moderate pain.   10 tablet   0   . lisinopril (PRINIVIL,ZESTRIL) 40 MG tablet   Oral   Take 20 mg by mouth daily.         . traMADol (ULTRAM) 50 MG tablet   Oral   Take 50 mg by mouth every 6 (six) hours as needed. pain          BP 97/73  Pulse 79  Temp(Src) 98.1 F (36.7 C) (Oral)  Resp 18  Ht 5\' 10"  (1.778 m)  Wt 198 lb (89.812 kg)  BMI 28.41 kg/m2  SpO2 95% Physical Exam 1640: Physical examination:  Nursing notes reviewed; Vital signs and O2 SAT reviewed;  Constitutional: Well developed, Well nourished, Well hydrated, Uncomfortable appearing.; Head:  Normocephalic,  atraumatic; Eyes: EOMI, PERRL, No scleral icterus; ENMT: Mouth and pharynx normal, Mucous membranes moist; Neck: Supple, Full range of motion, No lymphadenopathy; Cardiovascular: Regular rate and rhythm, No gallop; Respiratory: Breath sounds clear & equal bilaterally, No rales, rhonchi, wheezes.  Speaking full sentences with ease, Normal respiratory effort/excursion; Chest: Nontender, Movement normal; Abdomen: Soft, +diffuse tenderness to palp. No rebound. Nondistended, Normal bowel sounds; Genitourinary: No CVA tenderness; Extremities: Pulses normal, No tenderness, No edema, No calf edema or asymmetry.; Neuro: AA&Ox3, Major CN  grossly intact.  Speech clear. No gross focal motor or sensory deficits in extremities.; Skin: Color normal, Warm, Dry.   ED Course  Procedures    EKG Interpretation None      MDM  MDM Reviewed: previous chart, nursing note and vitals Reviewed previous: labs, ultrasound and CT scan Interpretation: CT scan, x-ray and labs    Results for orders placed during the hospital encounter of 11/27/13  CBC WITH DIFFERENTIAL      Result Value Ref Range   WBC 9.5  4.0 - 10.5 K/uL   RBC 5.79  4.22 - 5.81 MIL/uL   Hemoglobin 17.3 (*) 13.0 - 17.0 g/dL   HCT 48.5  39.0 - 52.0 %   MCV 83.8  78.0 - 100.0 fL   MCH 29.9  26.0 - 34.0 pg   MCHC 35.7  30.0 - 36.0 g/dL   RDW 13.0  11.5 - 15.5 %   Platelets 230  150 - 400 K/uL   Neutrophils Relative % 63  43 - 77 %   Neutro Abs 5.9  1.7 - 7.7 K/uL   Lymphocytes Relative 27  12 - 46 %   Lymphs Abs 2.5  0.7 - 4.0 K/uL   Monocytes Relative 8  3 - 12 %   Monocytes Absolute 0.7  0.1 - 1.0 K/uL   Eosinophils Relative 3  0 - 5 %   Eosinophils Absolute 0.3  0.0 - 0.7 K/uL   Basophils Relative 1  0 - 1 %   Basophils Absolute 0.1  0.0 - 0.1 K/uL  COMPREHENSIVE METABOLIC PANEL      Result Value Ref Range   Sodium 136 (*) 137 - 147 mEq/L   Potassium 4.1  3.7 - 5.3 mEq/L   Chloride 99  96 - 112 mEq/L   CO2 25  19 - 32 mEq/L   Glucose, Bld 91  70 - 99 mg/dL   BUN 11  6 - 23 mg/dL   Creatinine, Ser 0.91  0.50 - 1.35 mg/dL   Calcium 9.8  8.4 - 10.5 mg/dL   Total Protein 7.1  6.0 - 8.3 g/dL   Albumin 3.7  3.5 - 5.2 g/dL   AST 36  0 - 37 U/L   ALT 100 (*) 0 - 53 U/L   Alkaline Phosphatase 130 (*) 39 - 117 U/L   Total Bilirubin 0.2 (*) 0.3 - 1.2 mg/dL   GFR calc non Af Amer >90  >90 mL/min   GFR calc Af Amer >90  >90 mL/min  LIPASE, BLOOD      Result Value Ref Range   Lipase 77 (*) 11 - 59 U/L  ETHANOL      Result Value Ref Range   Alcohol, Ethyl (B) <11  0 - 11 mg/dL   US Abdomen Complete 11/21/2013   CLINICAL DATA:  Right upper quadrant  tenderness. History of pancreatitis.  EXAM: ULTRASOUND ABDOMEN COMPLETE  COMPARISON:  CT ABD - PELV W/ CM dated 11/20/2013  FINDINGS: Gallbladder:  Sludge and small stones within. No pericholecystic fluid or wall thickening. Sonographic Murphy's sign was not elicited.  Common bile duct:  Diameter: Normal, 3 mm.  Liver:  No focal lesion identified. Within normal limits in parenchymal echogenicity.  IVC:  No abnormality visualized.  Pancreas:  Visualized portion unremarkable.  Spleen:  Size and appearance within normal limits.  Right Kidney:  Length: 11.1 cm. 1.9 cm lower pole lesion which corresponds with a cyst on yesterday's CT. No hydronephrosis.  Left Kidney:  Length: 11.2 cm. Echogenicity within normal limits. No mass or hydronephrosis visualized.  Abdominal aorta:  No aneurysm visualized.  Other findings:  None.  IMPRESSION: Gallbladder bones and sludge without acute cholecystitis or biliary ductal dilatation.   Electronically Signed   By: Abigail Miyamoto M.D.   On: 11/21/2013 12:18   Ct Abdomen Pelvis W Contrast 11/27/2013   CLINICAL DATA:  Right upper quadrant pain for several weeks. History of pancreatitis.  EXAM: CT ABDOMEN AND PELVIS WITH CONTRAST  TECHNIQUE: Multidetector CT imaging of the abdomen and pelvis was performed using the standard protocol following bolus administration of intravenous contrast.  CONTRAST:  134mL OMNIPAQUE IOHEXOL 300 MG/ML SOLN, 31mL OMNIPAQUE IOHEXOL 300 MG/ML SOLN  COMPARISON:  11/20/2013  FINDINGS: Pancreatic head appears mildly swollen with its margins slightly ill-defined. There is subtle stranding in the adjacent fat. This appearance is stable from the recent prior CT. Although this could be chronic, in again suggests mild uncomplicated pancreatitis. There is a small oval soft tissue mass along the inferior margin of the pancreatic tail that measures 18 mm x 13 mm, unchanged. There is a 13 mm posterior gastrohepatic ligament lymph nodes also stable.  There is minimal  subsegmental atelectasis at the lung bases. The heart is normal in size. Liver and spleen are unremarkable. No bile duct dilation. Normal gallbladder. No adrenal masses. Stable medial right midpole renal cyst. Kidneys otherwise unremarkable. Normal ureters. Normal bladder. There are atherosclerotic changes along the abdominal aorta. No aneurysm. There are There are several left colon diverticula. No diverticulitis. Mild prominence of the proximal small bowel is noted, which may be a mild adynamic ileus. There are associated air-fluid levels. No bowel wall thickening. No mesenteric inflammation. Normal appendix is visualized.  Stable degenerative changes noted of the visualized spine.  IMPRESSION: 1. Prominence of the pancreatic head with subtle adjacent fat stranding again suggests mild pancreatitis. The stability of this appearance, however, suggests that it may be chronic. 2. Mild gastrohepatic and peripancreatic adenopathy is also stable. 3. Mild prominence of the proximal small bowel with small bowel air-fluid levels. This may reflect a mild adynamic ileus. No bowel inflammatory changes. No other evidence of an acute abnormality.   Electronically Signed   By: Lajean Manes M.D.   On: 11/27/2013 19:04   Dg Abd Acute W/chest 11/27/2013   CLINICAL DATA:  Abdominal pain.  EXAM: ACUTE ABDOMEN SERIES (ABDOMEN 2 VIEW & CHEST 1 VIEW)  COMPARISON:  CT, 11/20/2013  FINDINGS: Normal bowel gas pattern. No evidence of obstruction. No free air. An oval density projects to the left of the pole to vertebrae, seen on the upright image only but not on the supine image. This is equivocal. It may have been a superimposed shadow. There is no convincing renal or ureteral stone.  Soft tissues are otherwise unremarkable.  Lungs are clear.  Normal heart, mediastinum and hila.  Degenerative changes are noted of the thoracolumbar spine.  IMPRESSION: 1. No acute findings. No obstruction, generalized adynamic ileus or  free air. No active  disease on the chest radiograph.   Electronically Signed   By: Lajean Manes M.D.   On: 11/27/2013 17:04    Results for DAMEL, CERTO (MRN [id number]) as of 11/27/2013 19:21  Ref. Range 11/20/2013 18:20 11/21/2013 05:21 11/22/2013 05:53 11/23/2013 05:46 11/27/2013 16:39  Lipase Latest Range: 11-59 U/L 75 (H) 99 (H) 61 (H) 49 77 (H)  AST Latest Range: 0-37 U/L 17 15   36  ALT Latest Range: 0-53 U/L 19 16   100 (H)    1930:  LFT's and lipase trending upward. CT scan with continued pancreatitis, new ileus; no clear acute cholecystitis. Dx and testing d/w pt and family.  Questions answered.  Verb understanding, agreeable to admit. T/C to [facility]. Shanon Brow, case discussed, including:  HPI, pertinent PM/SHx, VS/PE, dx testing, ED course and treatment:  Agreeable to observation admit, requests to write temporary orders, obtain medical bed to team 2.   Alfonzo Feller, DO 11/29/13 1458

## 2013-11-27 NOTE — ED Notes (Signed)
Patient c/o right upper abd pain. Patient denies any nausea, vomiting, fevers, or diarrhea. Per patient just discharged from hospital on Tuesday for acute pancreatitis, gallstones, and constipation. Patient was told to ER if pain returned. Per patient had had BM today, reports slight amount of red blood. Per patient has hemorrhoids from the constipation last week. Patient reports taking vicodin with some relief in pain.

## 2013-11-27 NOTE — ED Notes (Signed)
Pt c/o RUQ pain. Gradual onset since "earlier this week". Pt denies n/v/d. Pt states he was admitted into hospital for gallstones and pancreatitis last weekend. Pt was told to return to ED if pain returned.

## 2013-11-28 DIAGNOSIS — F17200 Nicotine dependence, unspecified, uncomplicated: Secondary | ICD-10-CM

## 2013-11-28 DIAGNOSIS — Z720 Tobacco use: Secondary | ICD-10-CM | POA: Diagnosis present

## 2013-11-28 DIAGNOSIS — K8020 Calculus of gallbladder without cholecystitis without obstruction: Secondary | ICD-10-CM

## 2013-11-28 LAB — COMPREHENSIVE METABOLIC PANEL
ALK PHOS: 106 U/L (ref 39–117)
ALT: 75 U/L — ABNORMAL HIGH (ref 0–53)
AST: 26 U/L (ref 0–37)
Albumin: 3.1 g/dL — ABNORMAL LOW (ref 3.5–5.2)
BILIRUBIN TOTAL: 0.3 mg/dL (ref 0.3–1.2)
BUN: 9 mg/dL (ref 6–23)
CO2: 28 mEq/L (ref 19–32)
Calcium: 9.1 mg/dL (ref 8.4–10.5)
Chloride: 103 mEq/L (ref 96–112)
Creatinine, Ser: 0.94 mg/dL (ref 0.50–1.35)
GFR calc Af Amer: >90 mL/min (ref >90)
GFR calc non Af Amer: >90 mL/min (ref >90)
Glucose, Bld: 115 mg/dL — ABNORMAL HIGH (ref 70–99)
POTASSIUM: 3.8 meq/L (ref 3.7–5.3)
Sodium: 141 mEq/L (ref 137–147)
Total Protein: 6.1 g/dL (ref 6.0–8.3)

## 2013-11-28 LAB — MRSA PCR SCREENING: MRSA by PCR: NEGATIVE

## 2013-11-28 LAB — CBC
HCT: 46.8 % (ref 39.0–52.0)
Hemoglobin: 15.9 g/dL (ref 13.0–17.0)
MCH: 29.2 pg (ref 26.0–34.0)
MCHC: 34 g/dL (ref 30.0–36.0)
MCV: 85.9 fL (ref 78.0–100.0)
PLATELETS: 197 10*3/uL (ref 150–400)
RBC: 5.45 MIL/uL (ref 4.22–5.81)
RDW: 12.9 % (ref 11.5–15.5)
WBC: 7.2 10*3/uL (ref 4.0–10.5)

## 2013-11-28 LAB — LIPASE, BLOOD: Lipase: 554 U/L — ABNORMAL HIGH (ref 11–59)

## 2013-11-28 MED ORDER — POTASSIUM CHLORIDE IN NACL 20-0.9 MEQ/L-% IV SOLN
INTRAVENOUS | Status: DC
  Administered 2013-11-28: 125 mL via INTRAVENOUS
  Administered 2013-11-28 – 2013-11-29 (×2): via INTRAVENOUS

## 2013-11-28 MED ORDER — FAMOTIDINE IN NACL 20-0.9 MG/50ML-% IV SOLN
20.0000 mg | Freq: Every day | INTRAVENOUS | Status: DC
  Administered 2013-11-28 – 2013-11-29 (×2): 20 mg via INTRAVENOUS
  Filled 2013-11-28 (×3): qty 50

## 2013-11-28 NOTE — Progress Notes (Signed)
[facility] PROGRESS NOTE  JACION DISMORE GHW:[id number] DOB: [birth year] DOA: 11/27/2013 PCP: Default, Provider, MD    Code Status: Full code Family Communication: Discussed with wife Disposition Plan: To be determined   Consultants:  General surgery, pending  Procedures:  None  Antibiotics:  None  HPI/Subjective: The patient is sitting up in bed. He has no significant pain; he rates his epigastric and right upper quadrant abdominal pain as 3/10 in intensity. He denies nausea vomiting.  Objective: Filed Vitals:   11/28/13 0949  BP: 123/81  Pulse: 60  Temp:   Resp: 15   temperature 97.9. Oxygen saturation 96% on room air.  Intake/Output Summary (Last 24 hours) at 11/28/13 1009 Last data filed at 11/28/13 0300  Gross per 24 hour  Intake 314.58 ml  Output      0 ml  Net 314.58 ml   Filed Weights   11/27/13 1603  Weight: 89.812 kg (198 lb)    Exam:   General:  Pleasant alert 48 year old Caucasian man sitting up in bed, in no acute distress.  Cardiovascular: S1, S2, with a soft systolic murmur and borderline bradycardia.  Respiratory: Clear to auscultation bilaterally.  Abdomen: Positive bowel sounds, soft, mildly to moderately tender in the epigastrium and right upper quadrant without rebound, distention, or masses palpated.  Musculoskeletal: No acute hot red joints.   Data Reviewed: Basic Metabolic Panel:  Recent Labs Lab 11/27/13 1639 11/28/13 0514  NA 136* 141  K 4.1 3.8  CL 99 103  CO2 25 28  GLUCOSE 91 115*  BUN 11 9  CREATININE 0.91 0.94  CALCIUM 9.8 9.1   Liver Function Tests:  Recent Labs Lab 11/27/13 1639 11/28/13 0514  AST 36 26  ALT 100* 75*  ALKPHOS 130* 106  BILITOT 0.2* 0.3  PROT 7.1 6.1  ALBUMIN 3.7 3.1*    Recent Labs Lab 11/22/13 0553 11/23/13 0546 11/27/13 1639 11/28/13 0514  LIPASE 61* 49 77* 554*   No results found for this basename: AMMONIA,  in the last 168 hours CBC:  Recent Labs Lab  11/27/13 1639 11/28/13 0514  WBC 9.5 7.2  NEUTROABS 5.9  --   HGB 17.3* 15.9  HCT 48.5 46.8  MCV 83.8 85.9  PLT 230 197   Cardiac Enzymes: No results found for this basename: CKTOTAL, CKMB, CKMBINDEX, TROPONINI,  in the last 168 hours BNP (last 3 results) No results found for this basename: PROBNP,  in the last 8760 hours CBG: No results found for this basename: GLUCAP,  in the last 168 hours  Recent Results (from the past 240 hour(s))  MRSA PCR SCREENING     Status: None   Collection Time    11/28/13  1:30 AM      Result Value Ref Range Status   MRSA by PCR NEGATIVE  NEGATIVE Final   Comment:            The GeneXpert MRSA Assay (FDA     approved for NASAL specimens     only), is one component of a     comprehensive MRSA colonization     surveillance program. It is not     intended to diagnose MRSA     infection nor to guide or     monitor treatment for     MRSA infections.     Studies: Ct Abdomen Pelvis W Contrast  11/27/2013   CLINICAL DATA:  Right upper quadrant pain for several weeks. History of pancreatitis.  EXAM: CT ABDOMEN AND  PELVIS WITH CONTRAST  TECHNIQUE: Multidetector CT imaging of the abdomen and pelvis was performed using the standard protocol following bolus administration of intravenous contrast.  CONTRAST:  119mL OMNIPAQUE IOHEXOL 300 MG/ML SOLN, 42mL OMNIPAQUE IOHEXOL 300 MG/ML SOLN  COMPARISON:  11/20/2013  FINDINGS: Pancreatic head appears mildly swollen with its margins slightly ill-defined. There is subtle stranding in the adjacent fat. This appearance is stable from the recent prior CT. Although this could be chronic, in again suggests mild uncomplicated pancreatitis. There is a small oval soft tissue mass along the inferior margin of the pancreatic tail that measures 18 mm x 13 mm, unchanged. There is a 13 mm posterior gastrohepatic ligament lymph nodes also stable.  There is minimal subsegmental atelectasis at the lung bases. The heart is normal in size.  Liver and spleen are unremarkable. No bile duct dilation. Normal gallbladder. No adrenal masses. Stable medial right midpole renal cyst. Kidneys otherwise unremarkable. Normal ureters. Normal bladder. There are atherosclerotic changes along the abdominal aorta. No aneurysm. There are There are several left colon diverticula. No diverticulitis. Mild prominence of the proximal small bowel is noted, which may be a mild adynamic ileus. There are associated air-fluid levels. No bowel wall thickening. No mesenteric inflammation. Normal appendix is visualized.  Stable degenerative changes noted of the visualized spine.  IMPRESSION: 1. Prominence of the pancreatic head with subtle adjacent fat stranding again suggests mild pancreatitis. The stability of this appearance, however, suggests that it may be chronic. 2. Mild gastrohepatic and peripancreatic adenopathy is also stable. 3. Mild prominence of the proximal small bowel with small bowel air-fluid levels. This may reflect a mild adynamic ileus. No bowel inflammatory changes. No other evidence of an acute abnormality.   Electronically Signed   By: Lajean Manes M.D.   On: 11/27/2013 19:04   Dg Abd Acute W/chest  11/27/2013   CLINICAL DATA:  Abdominal pain.  EXAM: ACUTE ABDOMEN SERIES (ABDOMEN 2 VIEW & CHEST 1 VIEW)  COMPARISON:  CT, 11/20/2013  FINDINGS: Normal bowel gas pattern. No evidence of obstruction. No free air. An oval density projects to the left of the pole to vertebrae, seen on the upright image only but not on the supine image. This is equivocal. It may have been a superimposed shadow. There is no convincing renal or ureteral stone.  Soft tissues are otherwise unremarkable.  Lungs are clear.  Normal heart, mediastinum and hila.  Degenerative changes are noted of the thoracolumbar spine.  IMPRESSION: 1. No acute findings. No obstruction, generalized adynamic ileus or free air. No active disease on the chest radiograph.   Electronically Signed   By: Lajean Manes M.D.   On: 11/27/2013 17:04    Scheduled Meds: . enoxaparin (LOVENOX) injection  40 mg Subcutaneous Q24H  . lisinopril  20 mg Oral Daily   Continuous Infusions: . sodium chloride 125 mL (11/28/13 0830)   Assessment:  Principal Problem:   Acute pancreatitis Active Problems:   Abdominal pain   Cholelithiasis   HTN (hypertension), benign   PTSD (post-traumatic stress disorder)   Tobacco abuse   1. Recurrent acute pancreatitis. His lipase increased this morning to greater than 500. This is likely biliary pancreatitis. This is his second episode in one week. Initially, it was thought to be secondary to Depakote. He has known cholelithiasis seen on the ultrasound of his abdomen last week during the previous hospitalization. We'll continue IV fluid hydration, analgesics, and antiemetics as needed. We'll consult general surgery.  Cholelithiasis with mild gastrohepatic  and peripancreatic adenopathy and mild elevation of ALT.  Query ileus. CT of his abdomen revealed mildly prominent small bowel with small bowel air-fluid levels. Continue bowel rest and IV fluids.  Hypertension. Currently stable and controlled on lisinopril.  PTSD. Currently stable.   Tobacco abuse. The patient was advised to stop smoking. We'll order tobacco cessation counseling. He refuses a nicotine patch.    Plan: 1. We'll continue IV fluid hydration, but will add potassium to the IV fluids. 2. Add H2 blocker empirically. 3. Tobacco cessation counseling. 4. General surgery consultation.  Time spent: 35 minutes.    [place] Pager [phone] If 7PM-7AM, please contact night-coverage at [email or web address], password [facility] 11/28/2013, 10:09 AM  LOS: 1 day

## 2013-11-28 NOTE — Progress Notes (Signed)
Patient transferred to 324 in stable condition via wheelchair. Report called to [facility]

## 2013-11-28 NOTE — Consult Note (Signed)
Reason for Consult: Acute pancreatitis Referring Physician: [facility]  Clinton Ross is an 48 y.o. male.  HPI: Patient is a 48 year old white male who was previously admitted with epigastric pain and pancreatitis. He did have an ultrasound the gallbladder which revealed cholelithiasis and biliary sludge. There was no evidence of acute appendicitis. Common bile duct was 3 mm in size. It was felt that this may be secondary to Depakote. He was discharged home and readmitted do to worsening epigastric pain. His lipase today is over 500. He did have a mild bump in his transaminases. He denies any alcohol use.  Past Medical History  Diagnosis Date  . Hypertension   . Bulging disc   . Acute pancreatitis 10/2013  . Gallstones   . Sciatica   . PTSD (post-traumatic stress disorder)     History reviewed. No pertinent past surgical history.  Family History  Problem Relation Age of Onset  . Hypertension Mother   . Stroke Father   . Diabetes Other     Social History:  reports that he has been smoking Cigarettes.  He has a 30 pack-year smoking history. He has never used smokeless tobacco. He reports that he does not drink alcohol or use illicit drugs.  Allergies: No Known Allergies  Medications: I have reviewed the patient's current medications.  Results for orders placed during the hospital encounter of 11/27/13 (from the past 48 hour(s))  CBC WITH DIFFERENTIAL     Status: Abnormal   Collection Time    11/27/13  4:39 PM      Result Value Ref Range   WBC 9.5  4.0 - 10.5 K/uL   RBC 5.79  4.22 - 5.81 MIL/uL   Hemoglobin 17.3 (*) 13.0 - 17.0 g/dL   HCT 48.5  39.0 - 52.0 %   MCV 83.8  78.0 - 100.0 fL   MCH 29.9  26.0 - 34.0 pg   MCHC 35.7  30.0 - 36.0 g/dL   RDW 13.0  11.5 - 15.5 %   Platelets 230  150 - 400 K/uL   Neutrophils Relative % 63  43 - 77 %   Neutro Abs 5.9  1.7 - 7.7 K/uL   Lymphocytes Relative 27  12 - 46 %   Lymphs Abs 2.5  0.7 - 4.0 K/uL   Monocytes Relative 8  3 -  12 %   Monocytes Absolute 0.7  0.1 - 1.0 K/uL   Eosinophils Relative 3  0 - 5 %   Eosinophils Absolute 0.3  0.0 - 0.7 K/uL   Basophils Relative 1  0 - 1 %   Basophils Absolute 0.1  0.0 - 0.1 K/uL  COMPREHENSIVE METABOLIC PANEL     Status: Abnormal   Collection Time    11/27/13  4:39 PM      Result Value Ref Range   Sodium 136 (*) 137 - 147 mEq/L   Potassium 4.1  3.7 - 5.3 mEq/L   Chloride 99  96 - 112 mEq/L   CO2 25  19 - 32 mEq/L   Glucose, Bld 91  70 - 99 mg/dL   BUN 11  6 - 23 mg/dL   Creatinine, Ser 0.91  0.50 - 1.35 mg/dL   Calcium 9.8  8.4 - 10.5 mg/dL   Total Protein 7.1  6.0 - 8.3 g/dL   Albumin 3.7  3.5 - 5.2 g/dL   AST 36  0 - 37 U/L   ALT 100 (*) 0 - 53 U/L   Alkaline  Phosphatase 130 (*) 39 - 117 U/L   Total Bilirubin 0.2 (*) 0.3 - 1.2 mg/dL   GFR calc non Af Amer >90  >90 mL/min   GFR calc Af Amer >90  >90 mL/min   Comment: (NOTE)     The eGFR has been calculated using the CKD EPI equation.     This calculation has not been validated in all clinical situations.     eGFR's persistently <90 mL/min signify possible Chronic Kidney     Disease.  LIPASE, BLOOD     Status: Abnormal   Collection Time    11/27/13  4:39 PM      Result Value Ref Range   Lipase 77 (*) 11 - 59 U/L  ETHANOL     Status: None   Collection Time    11/27/13  4:39 PM      Result Value Ref Range   Alcohol, Ethyl (B) <11  0 - 11 mg/dL   Comment:            LOWEST DETECTABLE LIMIT FOR     SERUM ALCOHOL IS 11 mg/dL     FOR MEDICAL PURPOSES ONLY  MRSA PCR SCREENING     Status: None   Collection Time    11/28/13  1:30 AM      Result Value Ref Range   MRSA by PCR NEGATIVE  NEGATIVE   Comment:            The GeneXpert MRSA Assay (FDA     approved for NASAL specimens     only), is one component of a     comprehensive MRSA colonization     surveillance program. It is not     intended to diagnose MRSA     infection nor to guide or     monitor treatment for     MRSA infections.  COMPREHENSIVE  METABOLIC PANEL     Status: Abnormal   Collection Time    11/28/13  5:14 AM      Result Value Ref Range   Sodium 141  137 - 147 mEq/L   Potassium 3.8  3.7 - 5.3 mEq/L   Chloride 103  96 - 112 mEq/L   CO2 28  19 - 32 mEq/L   Glucose, Bld 115 (*) 70 - 99 mg/dL   BUN 9  6 - 23 mg/dL   Creatinine, Ser 0.94  0.50 - 1.35 mg/dL   Calcium 9.1  8.4 - 10.5 mg/dL   Total Protein 6.1  6.0 - 8.3 g/dL   Albumin 3.1 (*) 3.5 - 5.2 g/dL   AST 26  0 - 37 U/L   ALT 75 (*) 0 - 53 U/L   Alkaline Phosphatase 106  39 - 117 U/L   Total Bilirubin 0.3  0.3 - 1.2 mg/dL   GFR calc non Af Amer >90  >90 mL/min   GFR calc Af Amer >90  >90 mL/min   Comment: (NOTE)     The eGFR has been calculated using the CKD EPI equation.     This calculation has not been validated in all clinical situations.     eGFR's persistently <90 mL/min signify possible Chronic Kidney     Disease.  CBC     Status: None   Collection Time    11/28/13  5:14 AM      Result Value Ref Range   WBC 7.2  4.0 - 10.5 K/uL   RBC 5.45  4.22 - 5.81 MIL/uL  Hemoglobin 15.9  13.0 - 17.0 g/dL   HCT 46.8  39.0 - 52.0 %   MCV 85.9  78.0 - 100.0 fL   MCH 29.2  26.0 - 34.0 pg   MCHC 34.0  30.0 - 36.0 g/dL   RDW 12.9  11.5 - 15.5 %   Platelets 197  150 - 400 K/uL  LIPASE, BLOOD     Status: Abnormal   Collection Time    11/28/13  5:14 AM      Result Value Ref Range   Lipase 554 (*) 11 - 59 U/L    Ct Abdomen Pelvis W Contrast  11/27/2013   CLINICAL DATA:  Right upper quadrant pain for several weeks. History of pancreatitis.  EXAM: CT ABDOMEN AND PELVIS WITH CONTRAST  TECHNIQUE: Multidetector CT imaging of the abdomen and pelvis was performed using the standard protocol following bolus administration of intravenous contrast.  CONTRAST:  129m OMNIPAQUE IOHEXOL 300 MG/ML SOLN, 558mOMNIPAQUE IOHEXOL 300 MG/ML SOLN  COMPARISON:  11/20/2013  FINDINGS: Pancreatic head appears mildly swollen with its margins slightly ill-defined. There is subtle stranding  in the adjacent fat. This appearance is stable from the recent prior CT. Although this could be chronic, in again suggests mild uncomplicated pancreatitis. There is a small oval soft tissue mass along the inferior margin of the pancreatic tail that measures 18 mm x 13 mm, unchanged. There is a 13 mm posterior gastrohepatic ligament lymph nodes also stable.  There is minimal subsegmental atelectasis at the lung bases. The heart is normal in size. Liver and spleen are unremarkable. No bile duct dilation. Normal gallbladder. No adrenal masses. Stable medial right midpole renal cyst. Kidneys otherwise unremarkable. Normal ureters. Normal bladder. There are atherosclerotic changes along the abdominal aorta. No aneurysm. There are There are several left colon diverticula. No diverticulitis. Mild prominence of the proximal small bowel is noted, which may be a mild adynamic ileus. There are associated air-fluid levels. No bowel wall thickening. No mesenteric inflammation. Normal appendix is visualized.  Stable degenerative changes noted of the visualized spine.  IMPRESSION: 1. Prominence of the pancreatic head with subtle adjacent fat stranding again suggests mild pancreatitis. The stability of this appearance, however, suggests that it may be chronic. 2. Mild gastrohepatic and peripancreatic adenopathy is also stable. 3. Mild prominence of the proximal small bowel with small bowel air-fluid levels. This may reflect a mild adynamic ileus. No bowel inflammatory changes. No other evidence of an acute abnormality.   Electronically Signed   By: DaLajean Manes.D.   On: 11/27/2013 19:04   Dg Abd Acute W/chest  11/27/2013   CLINICAL DATA:  Abdominal pain.  EXAM: ACUTE ABDOMEN SERIES (ABDOMEN 2 VIEW & CHEST 1 VIEW)  COMPARISON:  CT, 11/20/2013  FINDINGS: Normal bowel gas pattern. No evidence of obstruction. No free air. An oval density projects to the left of the pole to vertebrae, seen on the upright image only but not on the  supine image. This is equivocal. It may have been a superimposed shadow. There is no convincing renal or ureteral stone.  Soft tissues are otherwise unremarkable.  Lungs are clear.  Normal heart, mediastinum and hila.  Degenerative changes are noted of the thoracolumbar spine.  IMPRESSION: 1. No acute findings. No obstruction, generalized adynamic ileus or free air. No active disease on the chest radiograph.   Electronically Signed   By: DaLajean Manes.D.   On: 11/27/2013 17:04    ROS: See chart Blood pressure 131/82, pulse 59,  temperature 98.3 F (36.8 C), temperature source Oral, resp. rate 20, height _0  (1.778 m), weight 89.812 kg (198 lb), SpO2 96.00%. Physical Exam: Pleasant white male no acute distress. No scleral icterus noted. Abdomen was soft with mild tenderness to deep palpation in the right upper quadrant. It is also present in the epigastric region. No masses, hepatosplenomegaly, or rigidity are noted.  Assessment/Plan: Impression: Acute pancreatitis, possibly secondary to gallstones and biliary sludge Plan: Agree with need for laparoscopic cholecystectomy. Will do this prior to discharge and once his pancreatitis seems to be improving. I will get clarification as to whether we can do this or whether he needs to be done by the [facility] hospital.  Aviva Signs A 11/28/2013, 6:40 PM

## 2013-11-29 DIAGNOSIS — K5900 Constipation, unspecified: Secondary | ICD-10-CM

## 2013-11-29 LAB — CBC
HCT: 44.6 % (ref 39.0–52.0)
HEMOGLOBIN: 15.5 g/dL (ref 13.0–17.0)
MCH: 29.4 pg (ref 26.0–34.0)
MCHC: 34.8 g/dL (ref 30.0–36.0)
MCV: 84.6 fL (ref 78.0–100.0)
Platelets: 188 10*3/uL (ref 150–400)
RBC: 5.27 MIL/uL (ref 4.22–5.81)
RDW: 12.9 % (ref 11.5–15.5)
WBC: 6 10*3/uL (ref 4.0–10.5)

## 2013-11-29 LAB — COMPREHENSIVE METABOLIC PANEL
ALBUMIN: 3.2 g/dL — AB (ref 3.5–5.2)
ALT: 55 U/L — ABNORMAL HIGH (ref 0–53)
AST: 21 U/L (ref 0–37)
Alkaline Phosphatase: 100 U/L (ref 39–117)
BUN: 7 mg/dL (ref 6–23)
CALCIUM: 9.1 mg/dL (ref 8.4–10.5)
CO2: 27 mEq/L (ref 19–32)
CREATININE: 0.93 mg/dL (ref 0.50–1.35)
Chloride: 105 mEq/L (ref 96–112)
GFR calc Af Amer: >90 mL/min (ref >90)
GFR calc non Af Amer: >90 mL/min (ref >90)
Glucose, Bld: 87 mg/dL (ref 70–99)
Potassium: 4.1 mEq/L (ref 3.7–5.3)
Sodium: 141 mEq/L (ref 137–147)
Total Bilirubin: 0.4 mg/dL (ref 0.3–1.2)
Total Protein: 6 g/dL (ref 6.0–8.3)

## 2013-11-29 LAB — LIPASE, BLOOD: LIPASE: 130 U/L — AB (ref 11–59)

## 2013-11-29 MED ORDER — SENNOSIDES-DOCUSATE SODIUM 8.6-50 MG PO TABS
1.0000 | ORAL_TABLET | Freq: Two times a day (BID) | ORAL | Status: DC
  Administered 2013-11-29: 1 via ORAL
  Filled 2013-11-29: qty 1

## 2013-11-29 NOTE — Discharge Summary (Signed)
Physician Discharge Summary  Clinton Ross [id number] DOB: 1965-09-30 DOA: 11/27/2013  PCP: Default, Provider, MD  Admit date: 11/27/2013 Discharge date: 11/29/2013  Time spent: Greater than 30 minutes  Recommendations for Outpatient Follow-up:  1. The patient was transferred to [facility] where Dr. Sherrie Mustache was the accepting physician.  Discharge Diagnoses:  1. Acute biliary pancreatitis, recurrent. 2. Cholelithiasis. 3. Hypertension. 4. Tobacco abuse. 5. PTSD, stable.  Discharge Condition: Stable  Diet recommendation: Clear liquids  Filed Weights   11/27/13 1603  Weight: 89.812 kg (198 lb)    History of present illness:  The patient is a 48 year old man with a history of hypertension, chronic low back pain, and PTSD. He also was recently hospitalized for acute pancreatitis, thought to be initially secondary to Depakote, but biliary pancreatitis was not ruled out. Ultrasound of the abdomen at that time revealed cholelithiasis but no evidence of acute cholecystitis. He improved clinically from the previous hospitalization. The plan was for him to be evaluated by a general surgeon electively either in [place] or locally for an elective cholecystectomy. However, the patient returned to the emergency department on 11/27/2013 with epigastric and right upper quadrant abdominal pain. In the ED, he was hemodynamically stable and afebrile. His lab data were significant for a lipase of 77, ALT of 100, and the remainder of his laboratory data was unremarkable. Another CT scan of his abdomen and pelvis was ordered and it revealed prominence of the pancreatic head suggesting mild pancreatitis; mild gastrohepatic and perihepatic antiemetic adenopathy which was stable, and mild prominence of the proximal small bowel with small bowel air-fluid levels, possible mild adynamic ileus. He was admitted for further evaluation and management.  Hospital Course:   The patient was started on IV fluids for  hydration. He was made n.p.o. for bowel rest. IV Pepcid was started empirically. As needed hydromorphone was ordered for pain. Zofran was ordered as needed for nausea or vomiting. Lisinopril was continued for treatment of chronic hypertension. He was encouraged to stop smoking. Tobacco cessation counseling was ordered. He refuses a nicotine patch. His followup lipase increased to 555. His followup ALT decreased from 100-75. General surgeon, Dr. Arnoldo Morale was consulted. Given that this was the patient's second presentation, he recommended a laparoscopic cholecystectomy during this hospitalization, pending approval from the [facility]. The case management notified  the [facility] of the patient's hospitalization here at [facility] and the recommendation for surgery. [facility] Therefore, general surgery was consulted at the [facility]. Surgeon, Dr. Sherrie Mustache was notified. The dictating physician discussed the patient's case with Dr. Sherrie Mustache. He agreed to accept the patient in transfer today. The patient prefered to be transferred in his private vehicle with his wife driving. He is alert and oriented and hemodynamically stable. He is afebrile. His white blood cell count is within normal limits. He is ambulatory. His lipase has decreased to 130 and his ALT has decreased to 55.  The patient was started on a clear liquid diet by general surgery. He was instructed to continue a clear liquid diet and to not eat solid foods in route to [place]. He voiced understanding. Discharge instructions for the disposition location at the [facility] were given by the registered nurse.    Procedures:  None  Consultations:  General surgeon, Aviva Signs, M.D.  Discharge Exam: Filed Vitals:   11/29/13 1253  BP: 129/68  Pulse: 57  Temp: 97.4 F (36.3 C)  Resp: 20    General: No acute distress. Cardiovascular: S1, S2, with mild bradycardia. Respiratory: Clear to  auscultation bilaterally. Abdomen: Soft, nontender, nondistended, positive bowel sounds.  Discharge Instructions  Discharge Orders   Future Orders Complete By Expires   Discharge instructions  As directed    Comments:     Diet is clear liquids only for now.   Increase activity slowly  As directed        Medication List         HYDROcodone-acetaminophen 5-325 MG per tablet  Commonly known as:  NORCO/VICODIN  Take 1-2 tablets by mouth every 6 (six) hours as needed for moderate pain.     lisinopril 40 MG tablet  Commonly known as:  PRINIVIL,ZESTRIL  Take 20 mg by mouth daily.     traMADol 50 MG tablet  Commonly known as:  ULTRAM  Take 50 mg by mouth every 6 (six) hours as needed. pain       No Known Allergies    The results of significant diagnostics from this hospitalization (including imaging, microbiology, ancillary and laboratory) are listed below for reference.    Significant Diagnostic Studies: US Abdomen Complete  11/21/2013   CLINICAL DATA:  Right upper quadrant tenderness. History of pancreatitis.  EXAM: ULTRASOUND ABDOMEN COMPLETE  COMPARISON:  CT ABD - PELV W/ CM dated 11/20/2013  FINDINGS: Gallbladder:  Sludge and small stones within. No pericholecystic fluid or wall thickening. Sonographic Murphy's sign was not elicited.  Common bile duct:  Diameter: Normal, 3 mm.  Liver:  No focal lesion identified. Within normal limits in parenchymal echogenicity.  IVC:  No abnormality visualized.  Pancreas:  Visualized portion unremarkable.  Spleen:  Size and appearance within normal limits.  Right Kidney:  Length: 11.1 cm. 1.9 cm lower pole lesion which corresponds with a cyst on yesterday's CT. No hydronephrosis.  Left Kidney:  Length: 11.2 cm. Echogenicity within normal limits. No mass or hydronephrosis visualized.  Abdominal aorta:  No aneurysm visualized.  Other findings:  None.  IMPRESSION: Gallbladder bones and sludge without acute cholecystitis or biliary ductal dilatation.    Electronically Signed   By: Abigail Miyamoto M.D.   On: 11/21/2013 12:18   Ct Abdomen Pelvis W Contrast  11/27/2013   CLINICAL DATA:  Right upper quadrant pain for several weeks. History of pancreatitis.  EXAM: CT ABDOMEN AND PELVIS WITH CONTRAST  TECHNIQUE: Multidetector CT imaging of the abdomen and pelvis was performed using the standard protocol following bolus administration of intravenous contrast.  CONTRAST:  15mL OMNIPAQUE IOHEXOL 300 MG/ML SOLN, 71mL OMNIPAQUE IOHEXOL 300 MG/ML SOLN  COMPARISON:  11/20/2013  FINDINGS: Pancreatic head appears mildly swollen with its margins slightly ill-defined. There is subtle stranding in the adjacent fat. This appearance is stable from the recent prior CT. Although this could be chronic, in again suggests mild uncomplicated pancreatitis. There is a small oval soft tissue mass along the inferior margin of the pancreatic tail that measures 18 mm x 13 mm, unchanged. There is a 13 mm posterior gastrohepatic ligament lymph nodes also stable.  There is minimal subsegmental atelectasis at the lung bases. The heart is normal in size. Liver and spleen are unremarkable. No bile duct dilation. Normal gallbladder. No adrenal masses. Stable medial right midpole renal cyst. Kidneys otherwise unremarkable. Normal ureters. Normal bladder. There are atherosclerotic changes along the abdominal aorta. No aneurysm. There are There are several left colon diverticula. No diverticulitis. Mild prominence of the proximal small bowel is noted, which may be  a mild adynamic ileus. There are associated air-fluid levels. No bowel wall thickening. No mesenteric inflammation. Normal appendix is visualized.  Stable degenerative changes noted of the visualized spine.  IMPRESSION: 1. Prominence of the pancreatic head with subtle adjacent fat stranding again suggests mild pancreatitis. The stability of this appearance, however, suggests that it may be chronic. 2. Mild gastrohepatic and peripancreatic  adenopathy is also stable. 3. Mild prominence of the proximal small bowel with small bowel air-fluid levels. This may reflect a mild adynamic ileus. No bowel inflammatory changes. No other evidence of an acute abnormality.   Electronically Signed   By: Lajean Manes M.D.   On: 11/27/2013 19:04   Ct Abdomen Pelvis W Contrast  11/20/2013   CLINICAL DATA:  Abdominal pain for of the last several days. Dual bowel movement since pain began.  EXAM: CT ABDOMEN AND PELVIS WITH CONTRAST  TECHNIQUE: Multidetector CT imaging of the abdomen and pelvis was performed using the standard protocol following bolus administration of intravenous contrast.  CONTRAST:  76mL OMNIPAQUE IOHEXOL 300 MG/ML SOLN, 144mL OMNIPAQUE IOHEXOL 300 MG/ML SOLN  COMPARISON:  None.  FINDINGS: The margins of the pancreatic head are mildly ill-defined and there is subtle stranding in the adjacent fat. There are prominent peripancreatic and gastrohepatic ligament lymph nodes. The largest is a posterior gastrohepatic ligament lymph node measuring 13 mm in short axis. There is a more dense oval mass or node just inferior to the tail the pancreas measuring 13 mm in short axis. There is no peripancreatic fluid. The pancreas enhances diffusely. The portal vein, splenic vein and superior mesenteric vein are widely patent.  Lung bases show minimal subsegmental atelectasis but are otherwise clear. The heart is normal in size.  Normal liver and spleen. Gallbladder is unremarkable. No dilation of the common bile duct. No adrenal masses.  2.6 cm cyst arises from the medial right kidney. Kidneys otherwise unremarkable. Normal ureters. Normal bladder.  No other adenopathy. No ascites. There are atherosclerotic changes along the abdominal aorta. No aneurysm.  There is mild increased stool burden in the right colon. There is no bowel wall thickening or mesenteric inflammation. The small bowel is unremarkable. A normal appendix is visualized.  There are degenerative  changes throughout the visualized spine. No osteoblastic or osteolytic lesions.  IMPRESSION: 1. Findings suggest mild pancreatitis if this correlates clinically. The appearance of the pancreas and adjacent mild peripancreatic and gastrohepatic ligament adenopathy may all be chronic, however. 2. Mild increased stool burden in the right colon. No evidence of bowel obstruction or bowel inflammation. Normal appendix is visualized. 3. Right renal cyst.  Degenerative changes of the visualized spine. 4. No other abnormalities.   Electronically Signed   By: Lajean Manes M.D.   On: 11/20/2013 20:27   Dg Abd Acute W/chest  11/27/2013   CLINICAL DATA:  Abdominal pain.  EXAM: ACUTE ABDOMEN SERIES (ABDOMEN 2 VIEW & CHEST 1 VIEW)  COMPARISON:  CT, 11/20/2013  FINDINGS: Normal bowel gas pattern. No evidence of obstruction. No free air. An oval density projects to the left of the pole to vertebrae, seen on the upright image only but not on the supine image. This is equivocal. It may have been a superimposed shadow. There is no convincing renal or ureteral stone.  Soft tissues are otherwise unremarkable.  Lungs are clear.  Normal heart, mediastinum and hila.  Degenerative changes are noted of the thoracolumbar spine.  IMPRESSION: 1. No acute findings. No obstruction, generalized adynamic ileus or free air. No  active disease on the chest radiograph.   Electronically Signed   By: Lajean Manes M.D.   On: 11/27/2013 17:04    Microbiology: Recent Results (from the past 240 hour(s))  MRSA PCR SCREENING     Status: None   Collection Time    11/28/13  1:30 AM      Result Value Ref Range Status   MRSA by PCR NEGATIVE  NEGATIVE Final   Comment:            The GeneXpert MRSA Assay (FDA     approved for NASAL specimens     only), is one component of a     comprehensive MRSA colonization     surveillance program. It is not     intended to diagnose MRSA     infection nor to guide or     monitor treatment for     MRSA  infections.     Labs: Basic Metabolic Panel:  Recent Labs Lab 11/27/13 1639 11/28/13 0514 11/29/13 0611  NA 136* 141 141  K 4.1 3.8 4.1  CL 99 103 105  CO2 25 28 27   GLUCOSE 91 115* 87  BUN 11 9 7   CREATININE 0.91 0.94 0.93  CALCIUM 9.8 9.1 9.1   Liver Function Tests:  Recent Labs Lab 11/27/13 1639 11/28/13 0514 11/29/13 0611  AST 36 26 21  ALT 100* 75* 55*  ALKPHOS 130* 106 100  BILITOT 0.2* 0.3 0.4  PROT 7.1 6.1 6.0  ALBUMIN 3.7 3.1* 3.2*    Recent Labs Lab 11/23/13 0546 11/27/13 1639 11/28/13 0514 11/29/13 0611  LIPASE 49 77* 554* 130*   No results found for this basename: AMMONIA,  in the last 168 hours CBC:  Recent Labs Lab 11/27/13 1639 11/28/13 0514 11/29/13 0611  WBC 9.5 7.2 6.0  NEUTROABS 5.9  --   --   HGB 17.3* 15.9 15.5  HCT 48.5 46.8 44.6  MCV 83.8 85.9 84.6  PLT 230 197 188   Cardiac Enzymes: No results found for this basename: CKTOTAL, CKMB, CKMBINDEX, TROPONINI,  in the last 168 hours BNP: BNP (last 3 results) No results found for this basename: PROBNP,  in the last 8760 hours CBG: No results found for this basename: GLUCAP,  in the last 168 hours     Signed:  Omarian Jaquith  [facility] 11/29/2013, 3:47 PM

## 2013-11-29 NOTE — Plan of Care (Signed)
Problem: Phase I Progression Outcomes Goal: OOB as tolerated unless otherwise ordered Outcome: Completed/Met Date Met:  11/29/13 Pt ambulating in hallway, independently.

## 2013-11-29 NOTE — Progress Notes (Signed)
Pt to be discharged to [facility] today per Dr. Runell Gess. Caryn Section. Pt's IV site D/C'd and WNL. Pt's VSS. Pt provided with home medication list and discharge instructions. Verbalized understanding. Pt instructed to go straight to ED Registration once arriving to [facility]. Verbalized understanding. Pt awaiting wife to return with clothes at this time. Pt to leave floor via WC accompanied by NT. Pt in stable condition at this time.

## 2013-11-29 NOTE — Plan of Care (Signed)
Problem: Phase II Progression Outcomes Goal: Progress activity as tolerated unless otherwise ordered Outcome: Completed/Met Date Met:  11/29/13 Pt ambulating in hallway, independently.

## 2013-11-29 NOTE — Care Management Note (Signed)
    Page 1 of 1   11/29/2013     2:53:32 PM   CARE MANAGEMENT NOTE 11/29/2013  Patient:  Clinton Ross, Clinton Ross   Account Number:  [id number]  Date Initiated:  11/29/2013  Documentation initiated by:  Claretha Cooper  Subjective/Objective Assessment:   Pt readmitted from home with acute pancreatitis. Surgical consult but needs direction from the [place] regarding transfer. Information sent to [facility] regarding transfer for surgery.     Action/Plan:   [facility]ing [facility] call [facility]   Anticipated DC Date:     Anticipated DC Plan:  [place]  CM consult      Choice offered to / List presented to:             Status of service:  Completed, signed off Medicare Important Message given?   (If response is "NO", the following Medicare IM given date fields will be blank) Date Medicare IM given:   Date Additional Medicare IM given:    Discharge Disposition:  ACUTE TO ACUTE TRANS  Per UR Regulation:    If discussed at Long Length of Stay Meetings, dates discussed:    Comments:  11/29/13 [place] Per Vaughan Basta at [facility], they do have a bed and the surgeon would like him transfered. Dr. Arnoldo Morale asked that Dr. Caryn Section talk to the [place] and she was agreeable. Preference form signed by pt to transfer and faxed  back to [facility] Staff RN Maricela Bo. notified.  11/29/13 Teia Freitas Dellia Nims RN BSN CM

## 2013-11-29 NOTE — Care Management Note (Signed)
UR completed 

## 2013-11-29 NOTE — Progress Notes (Signed)
[facility] PROGRESS NOTE  Clinton Ross [id number] DOB: 11-24-1965 DOA: 11/27/2013 PCP: Default, Provider, MD    Code Status: Full code Family Communication: Discussed with wife on 11/28/13 Disposition Plan: To be determined   Consultants:  General surgery, Dr. Arnoldo Morale  Procedures:  None  Antibiotics:  None  HPI/Subjective: The patient denies abdominal pain currently. Clear liquids started by general surgery. No complaints of nausea vomiting. He does feel constipated and is requesting a "stool softener".  Objective: Filed Vitals:   11/29/13 0423  BP: 127/87  Pulse: 60  Temp: 97.4 F (36.3 C)  Resp: 20   oxygen saturation 95% on room air.  Intake/Output Summary (Last 24 hours) at 11/29/13 1148 Last data filed at 11/29/13 0910  Gross per 24 hour  Intake   1730 ml  Output      0 ml  Net   1730 ml   Filed Weights   11/27/13 1603  Weight: 89.812 kg (198 lb)    Exam:   General:  Pleasant alert 48 year old Caucasian man in no acute distress.  Cardiovascular: S1, S2, with a soft systolic murmur and borderline bradycardia.  Respiratory: Clear to auscultation bilaterally.  Abdomen: Positive bowel sounds, soft, nontender, nondistended.  Musculoskeletal: No acute hot red joints.   Data Reviewed: Basic Metabolic Panel:  Recent Labs Lab 11/27/13 1639 11/28/13 0514 11/29/13 0611  NA 136* 141 141  K 4.1 3.8 4.1  CL 99 103 105  CO2 25 28 27   GLUCOSE 91 115* 87  BUN 11 9 7   CREATININE 0.91 0.94 0.93  CALCIUM 9.8 9.1 9.1   Liver Function Tests:  Recent Labs Lab 11/27/13 1639 11/28/13 0514 11/29/13 0611  AST 36 26 21  ALT 100* 75* 55*  ALKPHOS 130* 106 100  BILITOT 0.2* 0.3 0.4  PROT 7.1 6.1 6.0  ALBUMIN 3.7 3.1* 3.2*    Recent Labs Lab 11/23/13 0546 11/27/13 1639 11/28/13 0514 11/29/13 0611  LIPASE 49 77* 554* 130*   No results found for this basename: AMMONIA,  in the last 168 hours CBC:  Recent Labs Lab 11/27/13 1639  11/28/13 0514 11/29/13 0611  WBC 9.5 7.2 6.0  NEUTROABS 5.9  --   --   HGB 17.3* 15.9 15.5  HCT 48.5 46.8 44.6  MCV 83.8 85.9 84.6  PLT 230 197 188   Cardiac Enzymes: No results found for this basename: CKTOTAL, CKMB, CKMBINDEX, TROPONINI,  in the last 168 hours BNP (last 3 results) No results found for this basename: PROBNP,  in the last 8760 hours CBG: No results found for this basename: GLUCAP,  in the last 168 hours  Recent Results (from the past 240 hour(s))  MRSA PCR SCREENING     Status: None   Collection Time    11/28/13  1:30 AM      Result Value Ref Range Status   MRSA by PCR NEGATIVE  NEGATIVE Final   Comment:            The GeneXpert MRSA Assay (FDA     approved for NASAL specimens     only), is one component of a     comprehensive MRSA colonization     surveillance program. It is not     intended to diagnose MRSA     infection nor to guide or     monitor treatment for     MRSA infections.     Studies: Ct Abdomen Pelvis W Contrast  11/27/2013   CLINICAL DATA:  Right  upper quadrant pain for several weeks. History of pancreatitis.  EXAM: CT ABDOMEN AND PELVIS WITH CONTRAST  TECHNIQUE: Multidetector CT imaging of the abdomen and pelvis was performed using the standard protocol following bolus administration of intravenous contrast.  CONTRAST:  131mL OMNIPAQUE IOHEXOL 300 MG/ML SOLN, 77mL OMNIPAQUE IOHEXOL 300 MG/ML SOLN  COMPARISON:  11/20/2013  FINDINGS: Pancreatic head appears mildly swollen with its margins slightly ill-defined. There is subtle stranding in the adjacent fat. This appearance is stable from the recent prior CT. Although this could be chronic, in again suggests mild uncomplicated pancreatitis. There is a small oval soft tissue mass along the inferior margin of the pancreatic tail that measures 18 mm x 13 mm, unchanged. There is a 13 mm posterior gastrohepatic ligament lymph nodes also stable.  There is minimal subsegmental atelectasis at the lung bases.  The heart is normal in size. Liver and spleen are unremarkable. No bile duct dilation. Normal gallbladder. No adrenal masses. Stable medial right midpole renal cyst. Kidneys otherwise unremarkable. Normal ureters. Normal bladder. There are atherosclerotic changes along the abdominal aorta. No aneurysm. There are There are several left colon diverticula. No diverticulitis. Mild prominence of the proximal small bowel is noted, which may be a mild adynamic ileus. There are associated air-fluid levels. No bowel wall thickening. No mesenteric inflammation. Normal appendix is visualized.  Stable degenerative changes noted of the visualized spine.  IMPRESSION: 1. Prominence of the pancreatic head with subtle adjacent fat stranding again suggests mild pancreatitis. The stability of this appearance, however, suggests that it may be chronic. 2. Mild gastrohepatic and peripancreatic adenopathy is also stable. 3. Mild prominence of the proximal small bowel with small bowel air-fluid levels. This may reflect a mild adynamic ileus. No bowel inflammatory changes. No other evidence of an acute abnormality.   Electronically Signed   By: Lajean Manes M.D.   On: 11/27/2013 19:04   Dg Abd Acute W/chest  11/27/2013   CLINICAL DATA:  Abdominal pain.  EXAM: ACUTE ABDOMEN SERIES (ABDOMEN 2 VIEW & CHEST 1 VIEW)  COMPARISON:  CT, 11/20/2013  FINDINGS: Normal bowel gas pattern. No evidence of obstruction. No free air. An oval density projects to the left of the pole to vertebrae, seen on the upright image only but not on the supine image. This is equivocal. It may have been a superimposed shadow. There is no convincing renal or ureteral stone.  Soft tissues are otherwise unremarkable.  Lungs are clear.  Normal heart, mediastinum and hila.  Degenerative changes are noted of the thoracolumbar spine.  IMPRESSION: 1. No acute findings. No obstruction, generalized adynamic ileus or free air. No active disease on the chest radiograph.    Electronically Signed   By: Lajean Manes M.D.   On: 11/27/2013 17:04    Scheduled Meds: . enoxaparin (LOVENOX) injection  40 mg Subcutaneous Q24H  . famotidine (PEPCID) IV  20 mg Intravenous Daily  . lisinopril  20 mg Oral Daily  . senna-docusate  1 tablet Oral BID   Continuous Infusions: . 0.9 % NaCl with KCl 20 mEq / L 125 mL/hr at 11/29/13 0609   Assessment:  Principal Problem:   Acute pancreatitis Active Problems:   Abdominal pain   Cholelithiasis   HTN (hypertension), benign   PTSD (post-traumatic stress disorder)   Tobacco abuse   1. Recurrent acute pancreatitis likely biliary pancreatitis. This was initially thought to be from Depakote, but likely biliary origin. He is symptomatically improved. His lipase had increased to 554 on 11/28/13,  but has decreased today to 130. His his ALT is trending downward. He is afebrile and his white blood cell count is within normal limits. Dr. Arnoldo Morale has evaluated the patient and recommends a laparoscopic cholecystectomy once his pancreatitis subsides or resolved. Clarification whether or not it can be done here or at the [facility] is pending. Continue clear liquid diet and H2 blocker. Continue IV fluid hydration and symptomatic treatment.  Cholelithiasis with mild gastrohepatic and peripancreatic adenopathy and mild elevation of ALT.  Query ileus. CT of his abdomen revealed mildly prominent small bowel with small bowel air-fluid levels. Clinically, there is no ileus.  Hypertension. Currently stable and controlled on lisinopril.  PTSD. Currently stable.   Tobacco abuse. The patient was advised to stop smoking. He refuses a nicotine patch. Tobacco cessation counseling was ordered.    Plan: 1. continue current management. 2. Add Senokot-S twice a day. 3. Further recommendations per Dr. Arnoldo Morale.  Time spent: 30 minutes.    [facility] Pager [phone] If 7PM-7AM, please contact night-coverage at  [email or web address], password [facility] 11/29/2013, 11:48 AM  LOS: 2 days

## 2013-11-29 NOTE — Progress Notes (Signed)
Subjective: Abdominal pain almost resolved. Not needing as much pain medication IV. Is hungry.  Objective: Vital signs in last 24 hours: Temp:  [97.4 F (36.3 C)-98.3 F (36.8 C)] 97.4 F (36.3 C) (03/09 1253) Pulse Rate:  [57-64] 57 (03/09 1253) Resp:  [20] 20 (03/09 1253) BP: (108-131)/(59-87) 129/68 mmHg (03/09 1253) SpO2:  [95 %-100 %] 97 % (03/09 1253) Last BM Date: 11/27/13  Intake/Output from previous day: 03/08 0701 - 03/09 0700 In: 1680 [P.O.:1680] Out: -  Intake/Output this shift: Total I/O In: 410 [P.O.:360; IV Piggyback:50] Out: -   General appearance: alert and cooperative GI: Soft, nontender, nondistended. No rigidity noted.  Lab Results:   Recent Labs  11/28/13 0514 11/29/13 0611  WBC 7.2 6.0  HGB 15.9 15.5  HCT 46.8 44.6  PLT 197 188   BMET  Recent Labs  11/28/13 0514 11/29/13 0611  NA 141 141  K 3.8 4.1  CL 103 105  CO2 28 27  GLUCOSE 115* 87  BUN 9 7  CREATININE 0.94 0.93  CALCIUM 9.1 9.1   PT/INR No results found for this basename: LABPROT, INR,  in the last 72 hours  Studies/Results: Ct Abdomen Pelvis W Contrast  11/27/2013   CLINICAL DATA:  Right upper quadrant pain for several weeks. History of pancreatitis.  EXAM: CT ABDOMEN AND PELVIS WITH CONTRAST  TECHNIQUE: Multidetector CT imaging of the abdomen and pelvis was performed using the standard protocol following bolus administration of intravenous contrast.  CONTRAST:  174mL OMNIPAQUE IOHEXOL 300 MG/ML SOLN, 50mL OMNIPAQUE IOHEXOL 300 MG/ML SOLN  COMPARISON:  11/20/2013  FINDINGS: Pancreatic head appears mildly swollen with its margins slightly ill-defined. There is subtle stranding in the adjacent fat. This appearance is stable from the recent prior CT. Although this could be chronic, in again suggests mild uncomplicated pancreatitis. There is a small oval soft tissue mass along the inferior margin of the pancreatic tail that measures 18 mm x 13 mm, unchanged. There is a 13 mm  posterior gastrohepatic ligament lymph nodes also stable.  There is minimal subsegmental atelectasis at the lung bases. The heart is normal in size. Liver and spleen are unremarkable. No bile duct dilation. Normal gallbladder. No adrenal masses. Stable medial right midpole renal cyst. Kidneys otherwise unremarkable. Normal ureters. Normal bladder. There are atherosclerotic changes along the abdominal aorta. No aneurysm. There are There are several left colon diverticula. No diverticulitis. Mild prominence of the proximal small bowel is noted, which may be a mild adynamic ileus. There are associated air-fluid levels. No bowel wall thickening. No mesenteric inflammation. Normal appendix is visualized.  Stable degenerative changes noted of the visualized spine.  IMPRESSION: 1. Prominence of the pancreatic head with subtle adjacent fat stranding again suggests mild pancreatitis. The stability of this appearance, however, suggests that it may be chronic. 2. Mild gastrohepatic and peripancreatic adenopathy is also stable. 3. Mild prominence of the proximal small bowel with small bowel air-fluid levels. This may reflect a mild adynamic ileus. No bowel inflammatory changes. No other evidence of an acute abnormality.   Electronically Signed   By: Lajean Manes M.D.   On: 11/27/2013 19:04   Dg Abd Acute W/chest  11/27/2013   CLINICAL DATA:  Abdominal pain.  EXAM: ACUTE ABDOMEN SERIES (ABDOMEN 2 VIEW & CHEST 1 VIEW)  COMPARISON:  CT, 11/20/2013  FINDINGS: Normal bowel gas pattern. No evidence of obstruction. No free air. An oval density projects to the left of the pole to vertebrae, seen on the upright image  only but not on the supine image. This is equivocal. It may have been a superimposed shadow. There is no convincing renal or ureteral stone.  Soft tissues are otherwise unremarkable.  Lungs are clear.  Normal heart, mediastinum and hila.  Degenerative changes are noted of the thoracolumbar spine.  IMPRESSION: 1. No acute  findings. No obstruction, generalized adynamic ileus or free air. No active disease on the chest radiograph.   Electronically Signed   By: Lajean Manes M.D.   On: 11/27/2013 17:04    Anti-infectives: Anti-infectives   None      Assessment/Plan: Impression: Pancreatitis, resolving. Lipase is improving. Clinically he is doing better.  Plan: We'll advance to low-fat soft diet. Awaiting word from the [facility] as to whether he can have his surgery done here at or needs to be seen by them as an outpatient.  LOS: 2 days    Debra Calabretta A 11/29/2013

## 2013-12-04 ENCOUNTER — Encounter (HOSPITAL_COMMUNITY): Payer: Self-pay | Admitting: Emergency Medicine

## 2013-12-04 ENCOUNTER — Emergency Department (HOSPITAL_COMMUNITY): Payer: Non-veteran care

## 2013-12-04 ENCOUNTER — Emergency Department (HOSPITAL_COMMUNITY)
Admission: EM | Admit: 2013-12-04 | Discharge: 2013-12-04 | Disposition: A | Discharge status: Home / Self Care | Payer: Non-veteran care | Attending: Emergency Medicine | Admitting: Emergency Medicine

## 2013-12-04 DIAGNOSIS — R945 Abnormal results of liver function studies: Secondary | ICD-10-CM

## 2013-12-04 DIAGNOSIS — Z792 Long term (current) use of antibiotics: Secondary | ICD-10-CM | POA: Insufficient documentation

## 2013-12-04 DIAGNOSIS — Z9889 Other specified postprocedural states: Secondary | ICD-10-CM | POA: Insufficient documentation

## 2013-12-04 DIAGNOSIS — F17200 Nicotine dependence, unspecified, uncomplicated: Secondary | ICD-10-CM | POA: Insufficient documentation

## 2013-12-04 DIAGNOSIS — Z8719 Personal history of other diseases of the digestive system: Secondary | ICD-10-CM | POA: Insufficient documentation

## 2013-12-04 DIAGNOSIS — I10 Essential (primary) hypertension: Secondary | ICD-10-CM | POA: Insufficient documentation

## 2013-12-04 DIAGNOSIS — R7989 Other specified abnormal findings of blood chemistry: Secondary | ICD-10-CM

## 2013-12-04 DIAGNOSIS — Z4801 Encounter for change or removal of surgical wound dressing: Secondary | ICD-10-CM | POA: Insufficient documentation

## 2013-12-04 DIAGNOSIS — R1013 Epigastric pain: Secondary | ICD-10-CM | POA: Insufficient documentation

## 2013-12-04 DIAGNOSIS — R51 Headache: Secondary | ICD-10-CM | POA: Insufficient documentation

## 2013-12-04 DIAGNOSIS — Z79899 Other long term (current) drug therapy: Secondary | ICD-10-CM | POA: Insufficient documentation

## 2013-12-04 DIAGNOSIS — G8918 Other acute postprocedural pain: Secondary | ICD-10-CM | POA: Insufficient documentation

## 2013-12-04 DIAGNOSIS — Z4889 Encounter for other specified surgical aftercare: Secondary | ICD-10-CM

## 2013-12-04 DIAGNOSIS — Z8739 Personal history of other diseases of the musculoskeletal system and connective tissue: Secondary | ICD-10-CM | POA: Insufficient documentation

## 2013-12-04 DIAGNOSIS — Z8659 Personal history of other mental and behavioral disorders: Secondary | ICD-10-CM | POA: Insufficient documentation

## 2013-12-04 LAB — COMPREHENSIVE METABOLIC PANEL
ALBUMIN: 3.6 g/dL (ref 3.5–5.2)
ALT: 414 U/L — ABNORMAL HIGH (ref 0–53)
AST: 184 U/L — ABNORMAL HIGH (ref 0–37)
Alkaline Phosphatase: 327 U/L — ABNORMAL HIGH (ref 39–117)
BILIRUBIN TOTAL: 0.7 mg/dL (ref 0.3–1.2)
BUN: 19 mg/dL (ref 6–23)
CO2: 25 meq/L (ref 19–32)
CREATININE: 1.07 mg/dL (ref 0.50–1.35)
Calcium: 9.7 mg/dL (ref 8.4–10.5)
Chloride: 101 mEq/L (ref 96–112)
GFR calc non Af Amer: 80 mL/min — ABNORMAL LOW (ref >90)
Glucose, Bld: 120 mg/dL — ABNORMAL HIGH (ref 70–99)
Potassium: 4 mEq/L (ref 3.7–5.3)
Sodium: 137 mEq/L (ref 137–147)
Total Protein: 7.1 g/dL (ref 6.0–8.3)

## 2013-12-04 LAB — CBC WITH DIFFERENTIAL/PLATELET
BASOS PCT: 1 % (ref 0–1)
Basophils Absolute: 0 10*3/uL (ref 0.0–0.1)
Eosinophils Absolute: 0.3 10*3/uL (ref 0.0–0.7)
Eosinophils Relative: 4 % (ref 0–5)
HEMATOCRIT: 48.1 % (ref 39.0–52.0)
Hemoglobin: 16.6 g/dL (ref 13.0–17.0)
Lymphocytes Relative: 24 % (ref 12–46)
Lymphs Abs: 1.5 10*3/uL (ref 0.7–4.0)
MCH: 29.3 pg (ref 26.0–34.0)
MCHC: 34.5 g/dL (ref 30.0–36.0)
MCV: 84.8 fL (ref 78.0–100.0)
Monocytes Absolute: 0.4 10*3/uL (ref 0.1–1.0)
Monocytes Relative: 7 % (ref 3–12)
NEUTROS ABS: 3.9 10*3/uL (ref 1.7–7.7)
Neutrophils Relative %: 64 % (ref 43–77)
Platelets: 211 10*3/uL (ref 150–400)
RBC: 5.67 MIL/uL (ref 4.22–5.81)
RDW: 13 % (ref 11.5–15.5)
WBC: 6.1 10*3/uL (ref 4.0–10.5)

## 2013-12-04 LAB — ACETAMINOPHEN LEVEL

## 2013-12-04 LAB — URINE MICROSCOPIC-ADD ON

## 2013-12-04 LAB — URINALYSIS, ROUTINE W REFLEX MICROSCOPIC
Bilirubin Urine: NEGATIVE
Glucose, UA: NEGATIVE mg/dL
Ketones, ur: NEGATIVE mg/dL
LEUKOCYTES UA: NEGATIVE
Nitrite: NEGATIVE
PROTEIN: NEGATIVE mg/dL
Specific Gravity, Urine: 1.01 (ref 1.005–1.030)
UROBILINOGEN UA: 0.2 mg/dL (ref 0.0–1.0)
pH: 6.5 (ref 5.0–8.0)

## 2013-12-04 LAB — LIPASE, BLOOD: Lipase: 57 U/L (ref 11–59)

## 2013-12-04 LAB — LACTIC ACID, PLASMA: LACTIC ACID, VENOUS: 0.9 mmol/L (ref 0.5–2.2)

## 2013-12-04 IMAGING — CT CT ABD-PELV W/ CM
2 of 4 series · 16 of 46 positions shown, 18 images · IV contrast (omnipaque)
Comparison: CT ABD - PELV W/ CM dated [DATE]

CLINICAL DATA: Abdominal pain for 2 weeks. Cholecystectomy 4 days
ago with drainage from the abdominal incision.

EXAM:
CT ABDOMEN AND PELVIS WITH CONTRAST
TECHNIQUE: Multidetector CT imaging of the abdomen and pelvis was performed
using the standard protocol following bolus administration of
intravenous contrast.
CONTRAST:  50mL OMNIPAQUE IOHEXOL 300 MG/ML SOLN, 100mL OMNIPAQUE
IOHEXOL 300 MG/ML SOLN

[Series 2: abd_pel_with 5.0 b40f · axial · 0.71mm/px · z∈[-453,-33]mm · 13 of 94 slices shown, 15 images]
[im 5/94  soft-tissue]
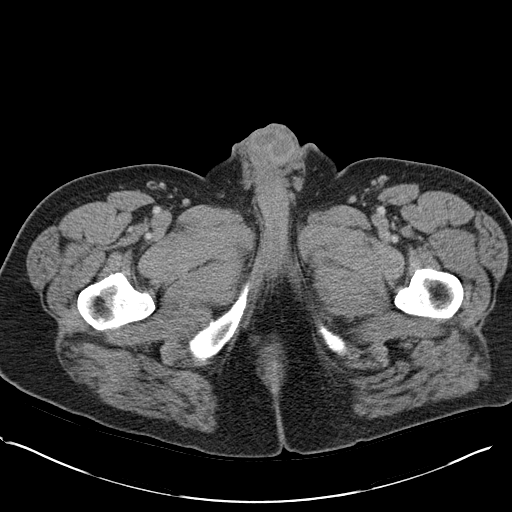
[im 5/94  bone]
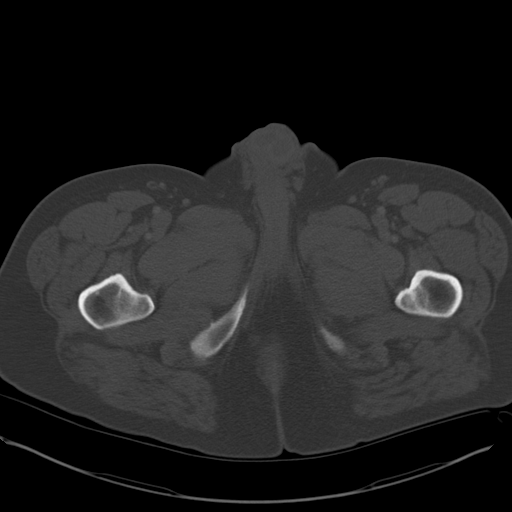
[im 14/94  soft-tissue]
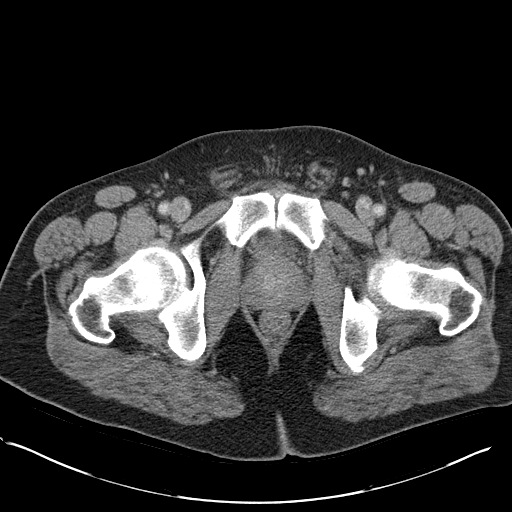
[im 18/94  soft-tissue]
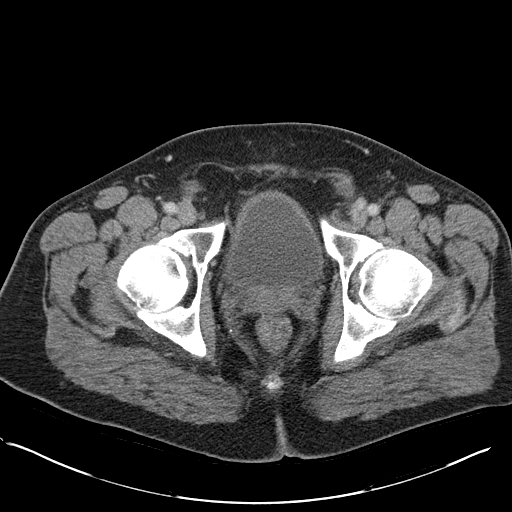
[im 27/94  soft-tissue]
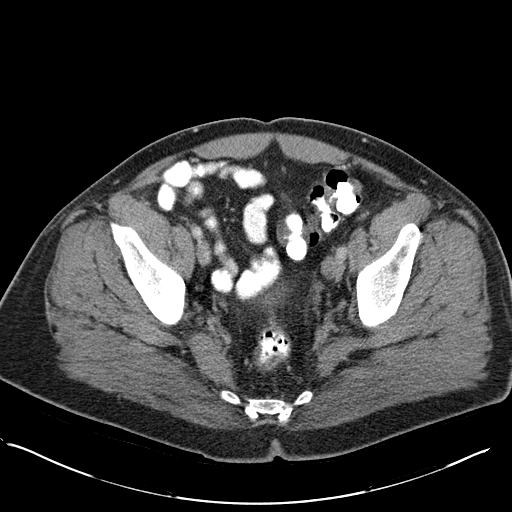
[im 32/94  soft-tissue]
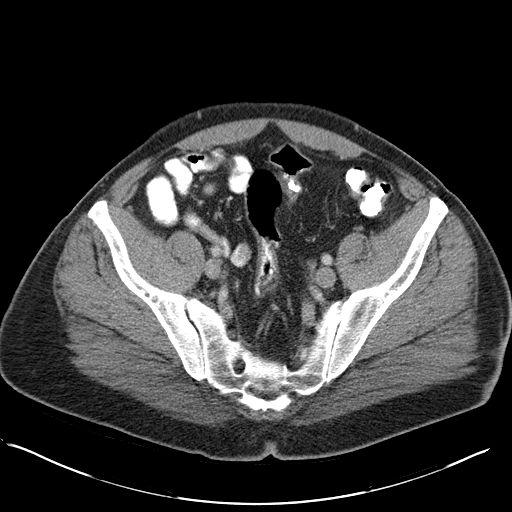
[im 40/94  soft-tissue]
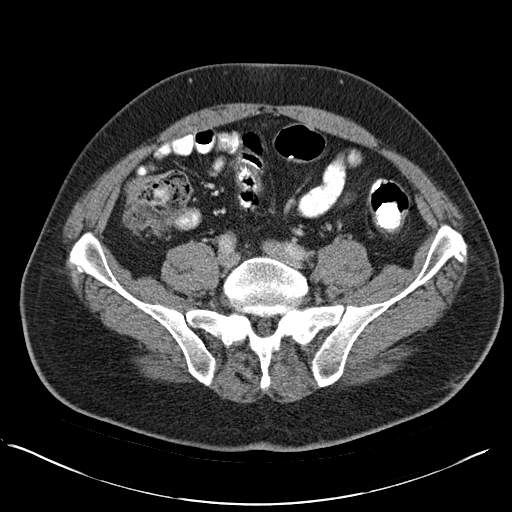
[im 49/94  soft-tissue]
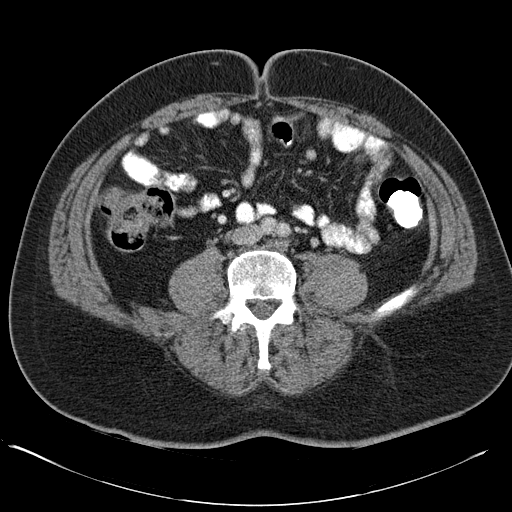
[im 54/94  soft-tissue]
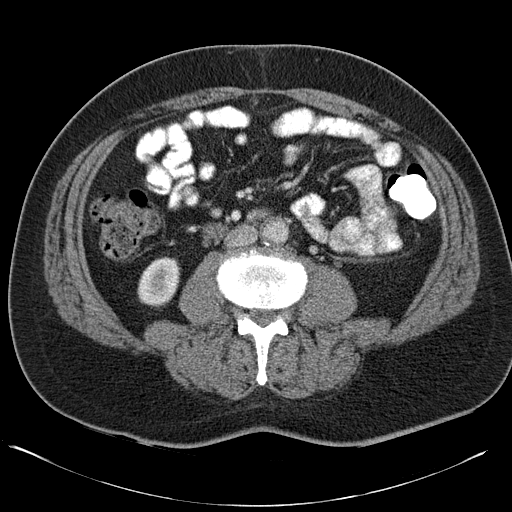
[im 63/94  soft-tissue]
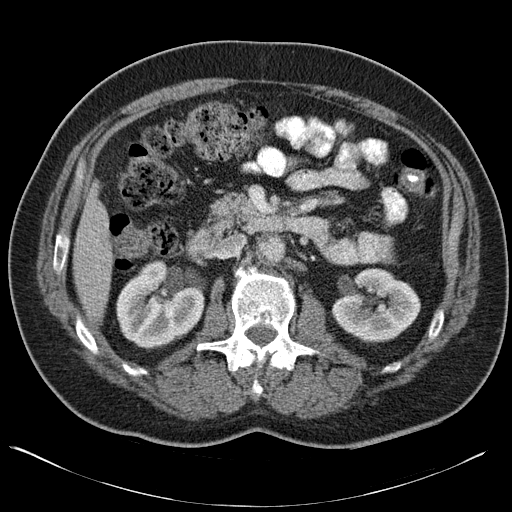
[im 63/94  bone]
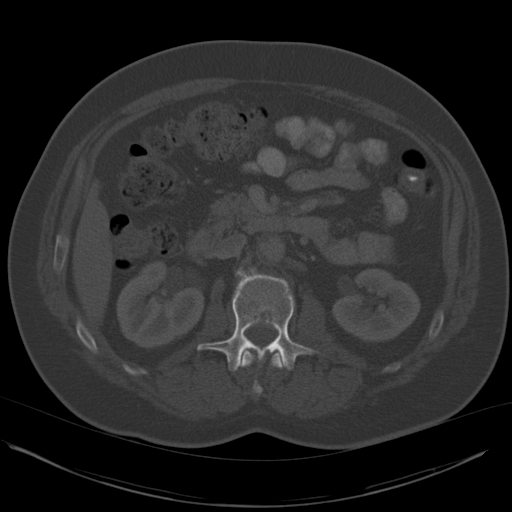
[im 67/94  soft-tissue]
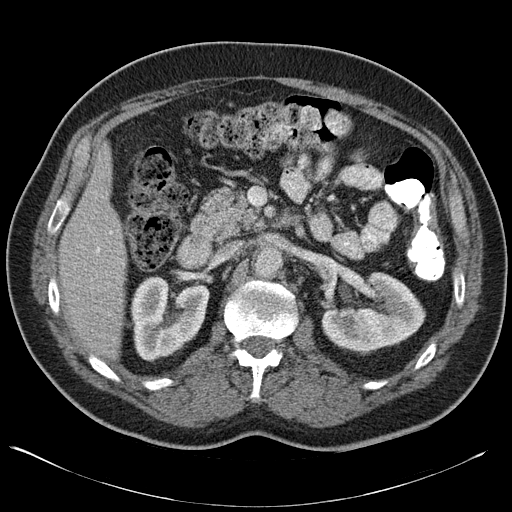
[im 76/94  soft-tissue]
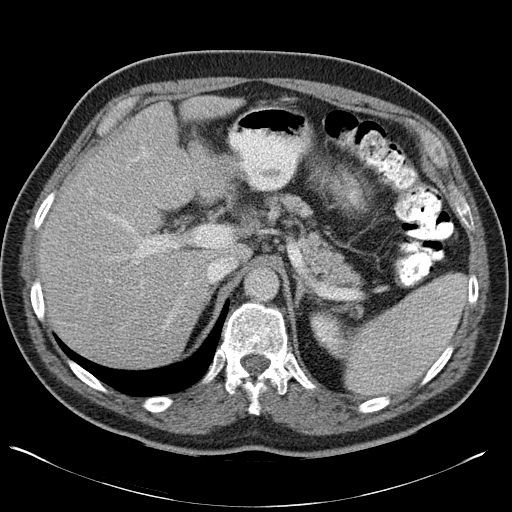
[im 80/94  soft-tissue]
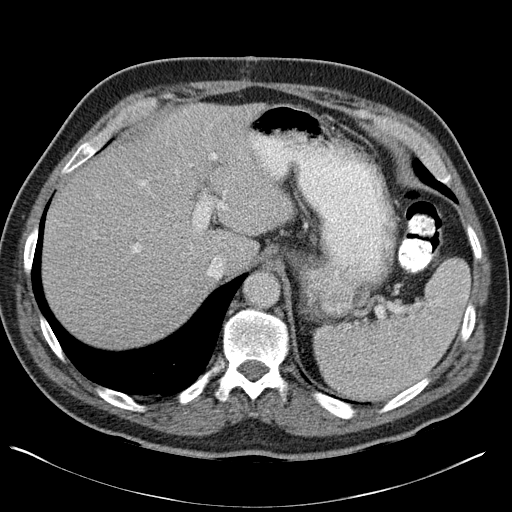
[im 89/94  soft-tissue]
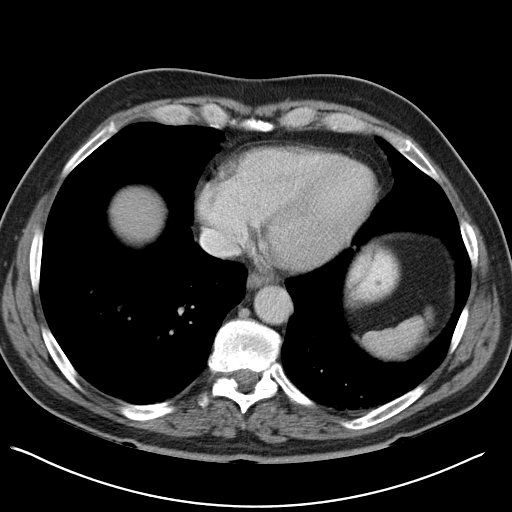

[Series 4: abd_pel_with 3.0 spo cor · coronal · 0.70mm/px · 3 of 87 slices shown]
[im 29/87  soft-tissue]
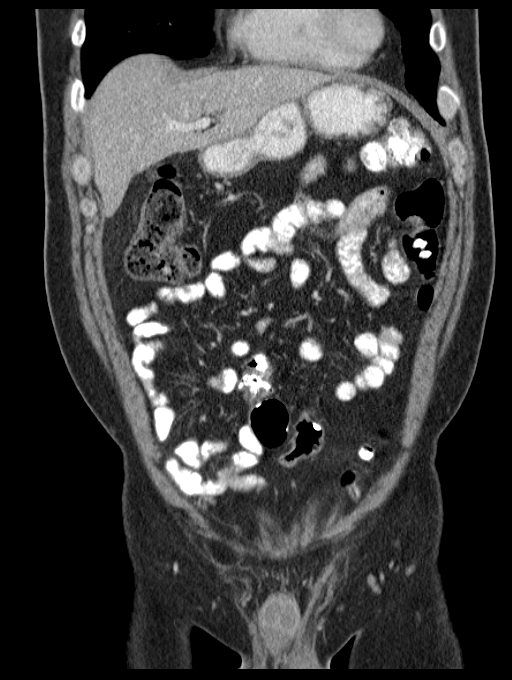
[im 39/87  soft-tissue]
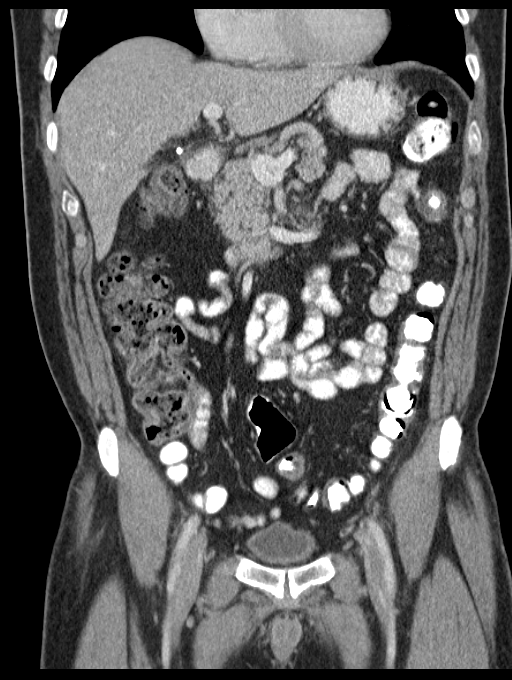
[im 48/87  soft-tissue]
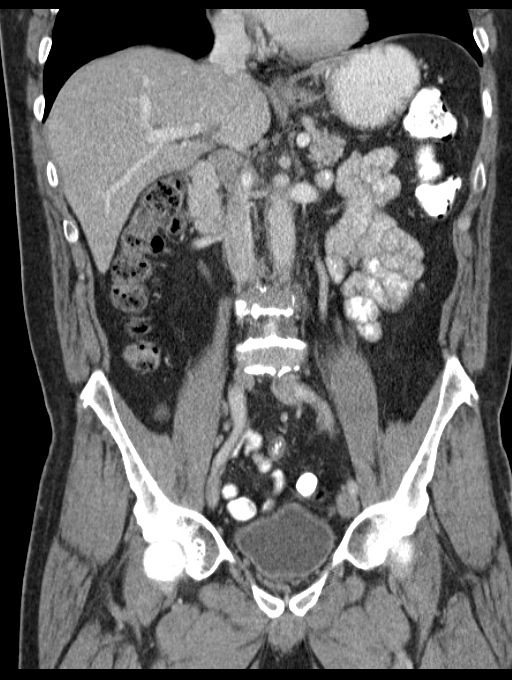

[16 of 46 positions shown; findings below may reference images not displayed]

FINDINGS: Minimal curvilinear subpleural bibasilar atelectasis. No pleural
effusion. A focus of gas is identified at the presumed umbilical
laparoscopic port incision site. No fluid collection is identified
within the gallbladder fossa or abdominal wall or intraperitoneal
space. Cholecystectomy clips are in place. Liver, adrenal glands,
kidneys, spleen, and pancreas are unremarkable with the exception of
a left parapelvic cyst.

No bowel wall thickening or focal segmental dilatation. Bladder is
normal. Fat containing right inguinal hernia identified. No bowel
wall thickening or focal segmental dilatation. The appendix is
normal. No acute osseous abnormality. Endplate degenerative change
is noted with loss of height of several lower thoracic vertebral
bodies which may be seen with Scheuermann's disease. This is stable.
IMPRESSION: No evidence for complication after cholecystectomy. No acute
intra-abdominal or pelvic process.

## 2013-12-04 MED ORDER — CEPHALEXIN 500 MG PO CAPS: 500.0000 mg | ORAL_CAPSULE | Freq: Four times a day (QID) | ORAL | Status: DC

## 2013-12-04 MED ORDER — IOHEXOL 300 MG/ML  SOLN
50.0000 mL | Freq: Once | INTRAMUSCULAR | Status: AC | PRN
  Administered 2013-12-04: 50 mL via ORAL

## 2013-12-04 MED ORDER — IOHEXOL 300 MG/ML  SOLN
100.0000 mL | Freq: Once | INTRAMUSCULAR | Status: AC | PRN
  Administered 2013-12-04: 100 mL via INTRAVENOUS

## 2013-12-04 MED ORDER — SODIUM CHLORIDE 0.9 % IV BOLUS (SEPSIS)
1000.0000 mL | Freq: Once | INTRAVENOUS | Status: AC
  Administered 2013-12-04: 1000 mL via INTRAVENOUS

## 2013-12-04 NOTE — ED Notes (Signed)
Pt tolerating p.o fluids.  Denies complaints at present time.  No distress noted.

## 2013-12-04 NOTE — ED Provider Notes (Signed)
CSN: [id number]     Arrival date & time 12/04/13  1459 History  This chart was scribed for No att. providers found,  by Stacy Gardner, ED Scribe. The patient was seen in room APA16A/APA16A and the patient's care was started at 3:29 PM.   First MD Initiated Contact with Patient 12/04/13 1524     Chief Complaint  Patient presents with  . Post-op Problem     (Consider location/radiation/quality/duration/timing/severity/associated sxs/prior Treatment) HPI HPI Comments: Clinton Ross is a 48 y.o. male who presents to the Emergency Department complaining of post operation problems after having a cholecystomy five days ago at the [facility] hospital . Denies any other complications other than HTN. Pt feels mildly dizzy and light-headed. Denies emesis, constipation, and  fever.  Pt's appetite has increased. The site has pain with palpation and drainage. Pt tried to call someone at the [facility] hospital about the drainage but they were unable to reach anyone. Denies heavy lifting. He took his medication for his HTN today. Nothing makes his symptoms better.  Past Medical History  Diagnosis Date  . Hypertension   . Bulging disc   . Acute pancreatitis 10/2013  . Gallstones   . Sciatica   . PTSD (post-traumatic stress disorder)    History reviewed. No pertinent past surgical history. Family History  Problem Relation Age of Onset  . Hypertension Mother   . Stroke Father   . Diabetes Other    History  Substance Use Topics  . Smoking status: Current Every Day Smoker -- 1.00 packs/day for 30 years    Types: Cigarettes  . Smokeless tobacco: Never Used  . Alcohol Use: No     Comment: occasional    Review of Systems  Constitutional: Positive for appetite change (increased). Negative for fatigue.  Gastrointestinal: Positive for abdominal distention. Negative for nausea, vomiting and constipation.  Neurological: Positive for dizziness and headaches.  All other systems reviewed and are  negative.      Allergies  Review of patient's allergies indicates no known allergies.  Home Medications   Current Outpatient Rx  Name  Route  Sig  Dispense  Refill  . docusate sodium (STOOL SOFTENER) 100 MG capsule   Oral   Take 100 mg by mouth daily.         Marland Kitchen HYDROcodone-acetaminophen (NORCO/VICODIN) 5-325 MG per tablet   Oral   Take 1-2 tablets by mouth every 6 (six) hours as needed for moderate pain.   10 tablet   0   . lisinopril (PRINIVIL,ZESTRIL) 40 MG tablet   Oral   Take 20 mg by mouth daily.         . cephALEXin (KEFLEX) 500 MG capsule   Oral   Take 1 capsule (500 mg total) by mouth 4 (four) times daily.   40 capsule   0    BP 135/86  Pulse 62  Temp(Src) 98.2 F (36.8 C) (Oral)  Resp 16  Ht 5\' 10"  (1.778 m)  Wt 196 lb (88.905 kg)  BMI 28.12 kg/m2  SpO2 97% Physical Exam  Nursing note and vitals reviewed. Constitutional: He is oriented to person, place, and time. He appears well-developed and well-nourished. No distress.  HENT:  Head: Normocephalic and atraumatic.  Eyes: EOM are normal. Pupils are equal, round, and reactive to light.  Neck: Normal range of motion. Neck supple. No tracheal deviation present.  Cardiovascular: Normal rate.   Pulmonary/Chest: Effort normal. No respiratory distress.  Abdominal: Soft. He exhibits no distension and no mass.  There is tenderness. There is no rebound and no guarding.  Healing inscision of the epigastrium and RUQ Mild periumbilical tenderness Dark appering drainage, ecchymosis and no erythema or fluctuance of the umbillical region.    Musculoskeletal: Normal range of motion.  Neurological: He is alert and oriented to person, place, and time.  Skin: Skin is warm and dry. No erythema.  Psychiatric: He has a normal mood and affect. His behavior is normal.    ED Course  Procedures (including critical care time) DIAGNOSTIC STUDIES: Oxygen Saturation is 96% on room air, normal by my interpretation.     COORDINATION OF CARE:  3:33 PM Discussed course of care with pt which includes abdominal CT, wound care, Omnipaque and laboratory tests  . Pt understands and agrees. 4:15 PM Consulted with Dr. Arnoldo Morale. He requests Steri-Strip wound care. He does not think admission to ED is necessary.  Pt understands and agrees.'   Labs Review Labs Reviewed  COMPREHENSIVE METABOLIC PANEL - Abnormal; Notable for the following:    Glucose, Bld 120 (*)    AST 184 (*)    ALT 414 (*)    Alkaline Phosphatase 327 (*)    GFR calc non Af Amer 80 (*)    All other components within normal limits  URINALYSIS, ROUTINE W REFLEX MICROSCOPIC - Abnormal; Notable for the following:    Hgb urine dipstick TRACE (*)    All other components within normal limits  CBC WITH DIFFERENTIAL  LACTIC ACID, PLASMA  LIPASE, BLOOD  URINE MICROSCOPIC-ADD ON  ACETAMINOPHEN LEVEL   Imaging Review Ct Abdomen Pelvis W Contrast  12/04/2013   CLINICAL DATA:  Abdominal pain for 2 weeks. Cholecystectomy 4 days ago with drainage from the abdominal incision.  EXAM: CT ABDOMEN AND PELVIS WITH CONTRAST  TECHNIQUE: Multidetector CT imaging of the abdomen and pelvis was performed using the standard protocol following bolus administration of intravenous contrast.  CONTRAST:  40mL OMNIPAQUE IOHEXOL 300 MG/ML SOLN, 178mL OMNIPAQUE IOHEXOL 300 MG/ML SOLN  COMPARISON:  CT ABD - PELV W/ CM dated 11/27/2013  FINDINGS: Minimal curvilinear subpleural bibasilar atelectasis. No pleural effusion. A focus of gas is identified at the presumed umbilical laparoscopic port incision site. No fluid collection is identified within the gallbladder fossa or abdominal wall or intraperitoneal space. Cholecystectomy clips are in place. Liver, adrenal glands, kidneys, spleen, and pancreas are unremarkable with the exception of a left parapelvic cyst.  No bowel wall thickening or focal segmental dilatation. Bladder is normal. Fat containing right inguinal hernia identified. No  bowel wall thickening or focal segmental dilatation. The appendix is normal. No acute osseous abnormality. Endplate degenerative change is noted with loss of height of several lower thoracic vertebral bodies which may be seen with Scheuermann's disease. This is stable.  IMPRESSION: No evidence for complication after cholecystectomy. No acute intra-abdominal or pelvic process.   Electronically Signed   By: Conchita Paris M.D.   On: 12/04/2013 18:03     EKG Interpretation None      MDM   Final diagnoses:  Postoperative pain  Encounter for post surgical wound check   Postop day 4 from cholecystectomy with transient periumbilical incision. No significant abdominal pain. No fever.Initially hypotensive at 86 systolic. Blood pressure has improved to [id number] systolic spontaneously. The low blood pressure reading appears to be spurious. Patient's further blood pressures in the ED were normal. No evidence of cellulitis or fluctuance on exam. Mild dark drainage from periumbilical incision. No peritoneal signs.  Discussed with Dr. Arnoldo Morale who  saw patient in hospital but did not perform surgery due to insurance reasons. He recommends removal Steri-Strips and clean the wound.  Patient has elevation of LFTs with normal lipase. CT scan was obtained. There is no evidence of any biliary obstruction. No postoperative complication.  Case was discussed with his surgeon at the [facility]. Dr. Sherrie Mustache States that it is possible LFT elevation could be from recent surgery. Patient has been taking oxycodone/acetaminophen every 6 hours and not taking any more than prescribed. Denies additional acetaminophen use.  Elevated LFTs discussed with Dr. Roderic Palau of hospitalist as well.  He agrees that it is appropriate for outpatient follow up as patient is nontoxic appearing and low blood pressure reading was spurious. Subsequent blood pressures were normal even before IVF started.   Dr. Sherrie Mustache wishes to see patient at 1 PM on  Monday for a recheck and repeat LFTs. Suggests empiric keflex for incision. Patient and family informed of this appointment. Advised need for additional recheck of LFTs on Monday.    I personally performed the services described in this documentation, which was scribed in my presence. The recorded information has been reviewed and is accurate.      Ezequiel Essex, MD 12/04/13 [phone]

## 2013-12-04 NOTE — Discharge Instructions (Signed)
Surgical Site Infection Followup with. Dr. Sherrie Mustache on Monday at 1 PM. Take the antibiotics as prescribed. You need to have a recheck of her liver function on Monday as well. Return to the ED if you develop new or worsening symptoms. A surgical site infection can occur after surgery. It happens in the part of the body where the surgery took place. Most patients who have surgery do not develop an infection.  SYMPTOMS  Redness and pain around the surgical site.  Drainage of cloudy fluid from the wound.  Fever. PREVENTION Before the procedure:  Tell your caregiver about any medical problems you may have. Health problems such as allergies, diabetes, and obesity could affect your surgery and treatment.  Quit smoking. Patients who smoke get more infections. Talk to your caregiver about how you can quit before your surgery.  Do not shave near the area where you will have surgery. Shaving with a razor can irritate your skin and make it easier to get an infection. Your caregiver may use an electric clipper to remove some of your hair immediately before surgery.  Ask if you will get antibiotic medicine. In most cases, you should get antibiotics within 60 minutes before surgery. Antibiotics should be stopped within 24 hours after surgery.  Your caregivers will clean their hands and arms up to their elbows with an antiseptic agent just before the surgery. Your caregivers will also clean their hands with soap and water or an alcohol-based hand rub before and after caring for each patient.  Your caregivers will wear hair covers, masks, gowns, and gloves during surgery to keep the surgery area clean.  Your caregivers will clean the skin at the surgery site with a soap that kills germs. After the procedure:  Make sure your caregivers clean their hands before examining you. They may use soap and water or an alcohol-based hand rub. If you do not see your caregivers clean their hands, ask them to do so.  Make  sure family and friends who visit you do not touch the surgical wound or dressings.  Ask family and friends to clean their hands with soap and water or an alcohol-based hand rub before and after visiting you.  Make sure you know how to care for your wound before you leave the hospital. Your caregiver will tell you how to take care of your wound.  Make sure you know whom to contact if you have questions or problems after you get home. TREATMENT Most surgical site infections can be treated with antibiotics. Sometimes, patients need another surgery to treat the infection. HOME CARE INSTRUCTIONS Always clean your hands before and after caring for your wound. SEEK IMMEDIATE MEDICAL CARE IF: You have any symptoms of an infection, such as drainage, fever, or redness and pain at the surgery site. Document Released: 12/05/2010 Document Revised: 12/02/2011 Document Reviewed: 12/05/2010 [facility] Patient Information 2014 [place], [place].

## 2013-12-04 NOTE — ED Notes (Addendum)
Pt states he had gallbladder removed Tuesday. Noticed drainage, described as red and brown in color, from incision site to umbilicus. Pt states pain to the area also. Blood pressure currently 86/58, taken twice in triage. Pt also had percocet PTA .

## 2014-11-15 ENCOUNTER — Emergency Department (HOSPITAL_COMMUNITY): Payer: Non-veteran care

## 2014-11-15 ENCOUNTER — Encounter (HOSPITAL_COMMUNITY): Payer: Self-pay | Admitting: *Deleted

## 2014-11-15 ENCOUNTER — Emergency Department (HOSPITAL_COMMUNITY)
Admission: EM | Admit: 2014-11-15 | Discharge: 2014-11-15 | Disposition: A | Discharge status: Home / Self Care | Payer: Non-veteran care | Attending: Emergency Medicine | Admitting: Emergency Medicine

## 2014-11-15 DIAGNOSIS — Z8659 Personal history of other mental and behavioral disorders: Secondary | ICD-10-CM | POA: Insufficient documentation

## 2014-11-15 DIAGNOSIS — Z792 Long term (current) use of antibiotics: Secondary | ICD-10-CM | POA: Insufficient documentation

## 2014-11-15 DIAGNOSIS — S9002XA Contusion of left ankle, initial encounter: Secondary | ICD-10-CM | POA: Insufficient documentation

## 2014-11-15 DIAGNOSIS — Y9289 Other specified places as the place of occurrence of the external cause: Secondary | ICD-10-CM | POA: Insufficient documentation

## 2014-11-15 DIAGNOSIS — Y998 Other external cause status: Secondary | ICD-10-CM | POA: Insufficient documentation

## 2014-11-15 DIAGNOSIS — X58XXXA Exposure to other specified factors, initial encounter: Secondary | ICD-10-CM | POA: Insufficient documentation

## 2014-11-15 DIAGNOSIS — S93402A Sprain of unspecified ligament of left ankle, initial encounter: Secondary | ICD-10-CM | POA: Insufficient documentation

## 2014-11-15 DIAGNOSIS — Y9389 Activity, other specified: Secondary | ICD-10-CM | POA: Insufficient documentation

## 2014-11-15 DIAGNOSIS — Z8639 Personal history of other endocrine, nutritional and metabolic disease: Secondary | ICD-10-CM | POA: Insufficient documentation

## 2014-11-15 DIAGNOSIS — Z720 Tobacco use: Secondary | ICD-10-CM | POA: Insufficient documentation

## 2014-11-15 DIAGNOSIS — I10 Essential (primary) hypertension: Secondary | ICD-10-CM | POA: Insufficient documentation

## 2014-11-15 IMAGING — DX DG ANKLE COMPLETE 3+V*L*
3 series · 3 of 3 positions shown · non-contrast
Comparison: None.

CLINICAL DATA: Left ankle pain after twisting injury earlier this
day.

EXAM:
LEFT ANKLE COMPLETE - 3+ VIEW

[ankle ap]
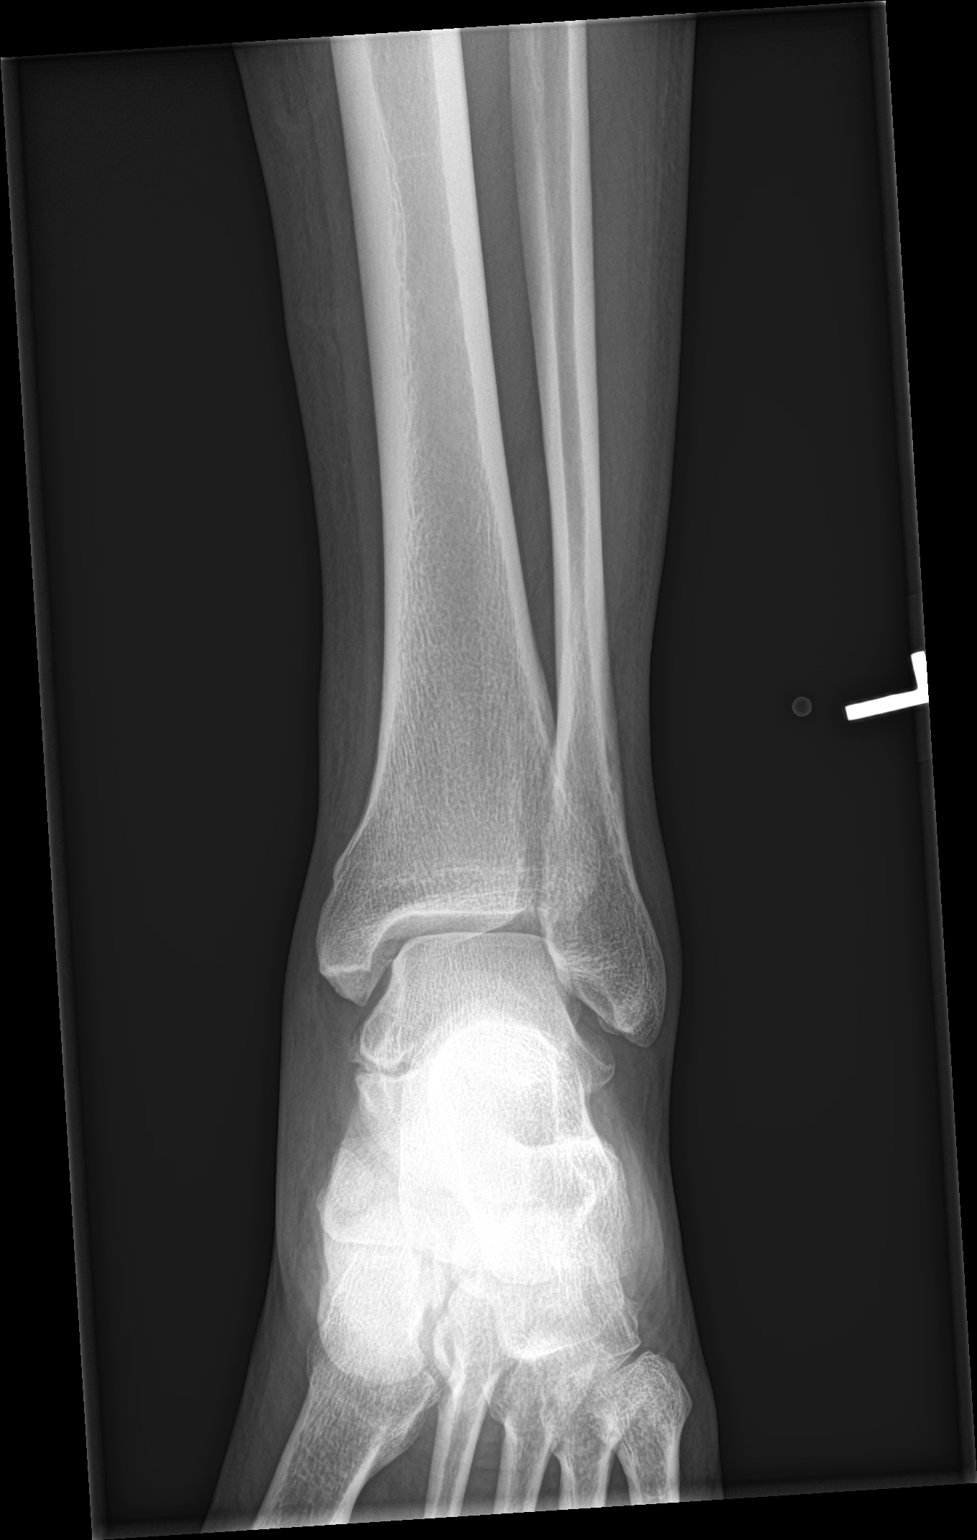

[ankle obl]
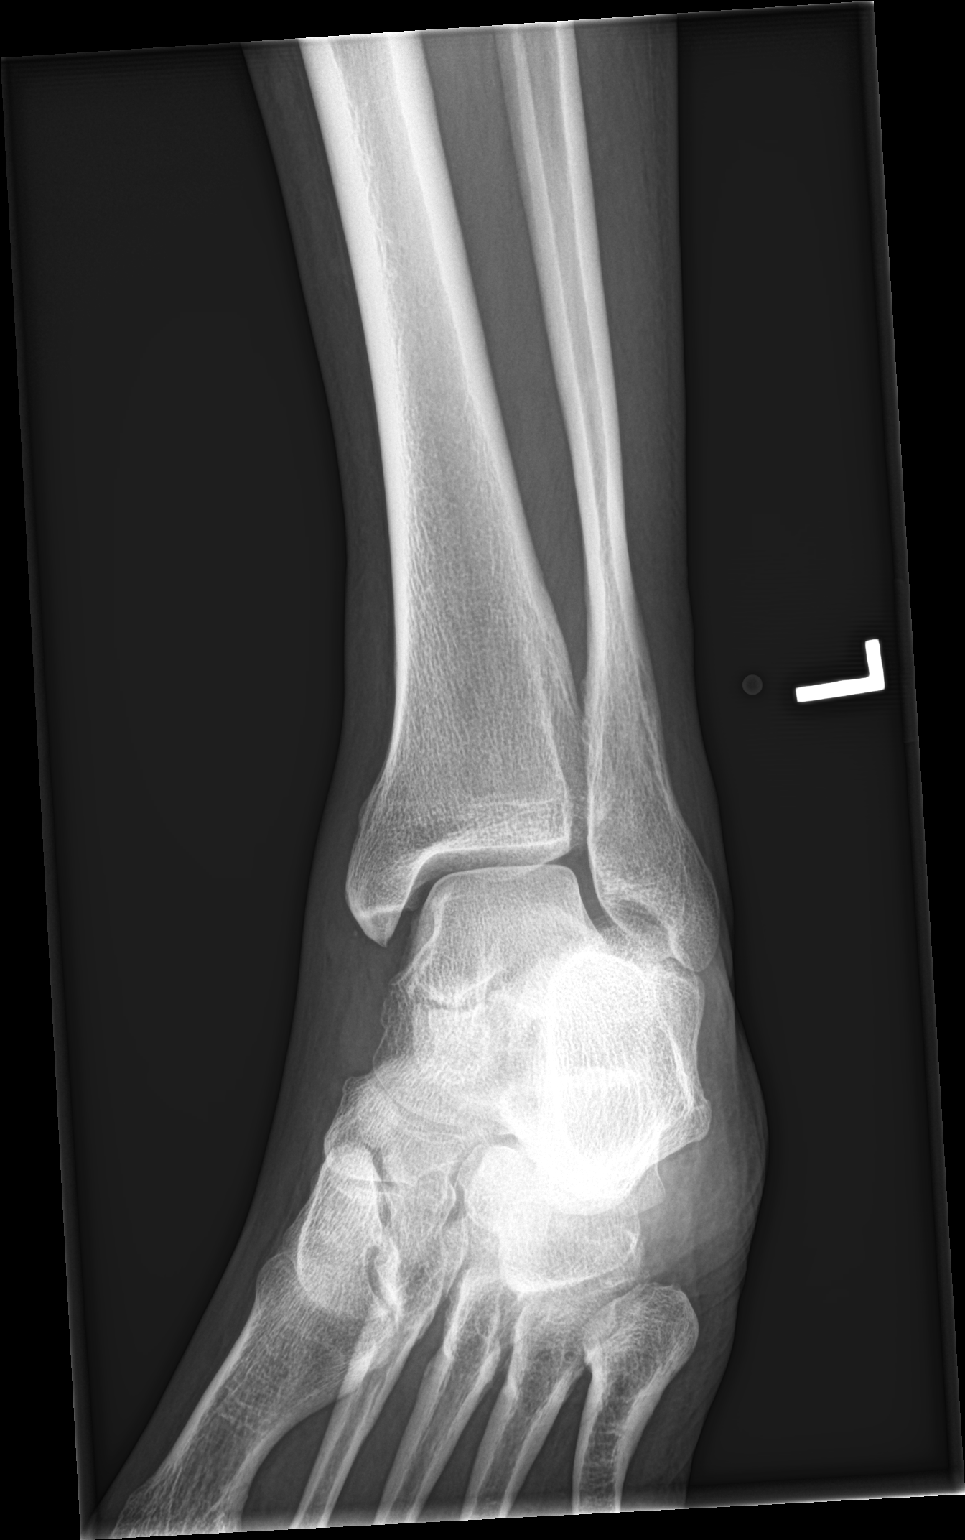

[ankle lat]
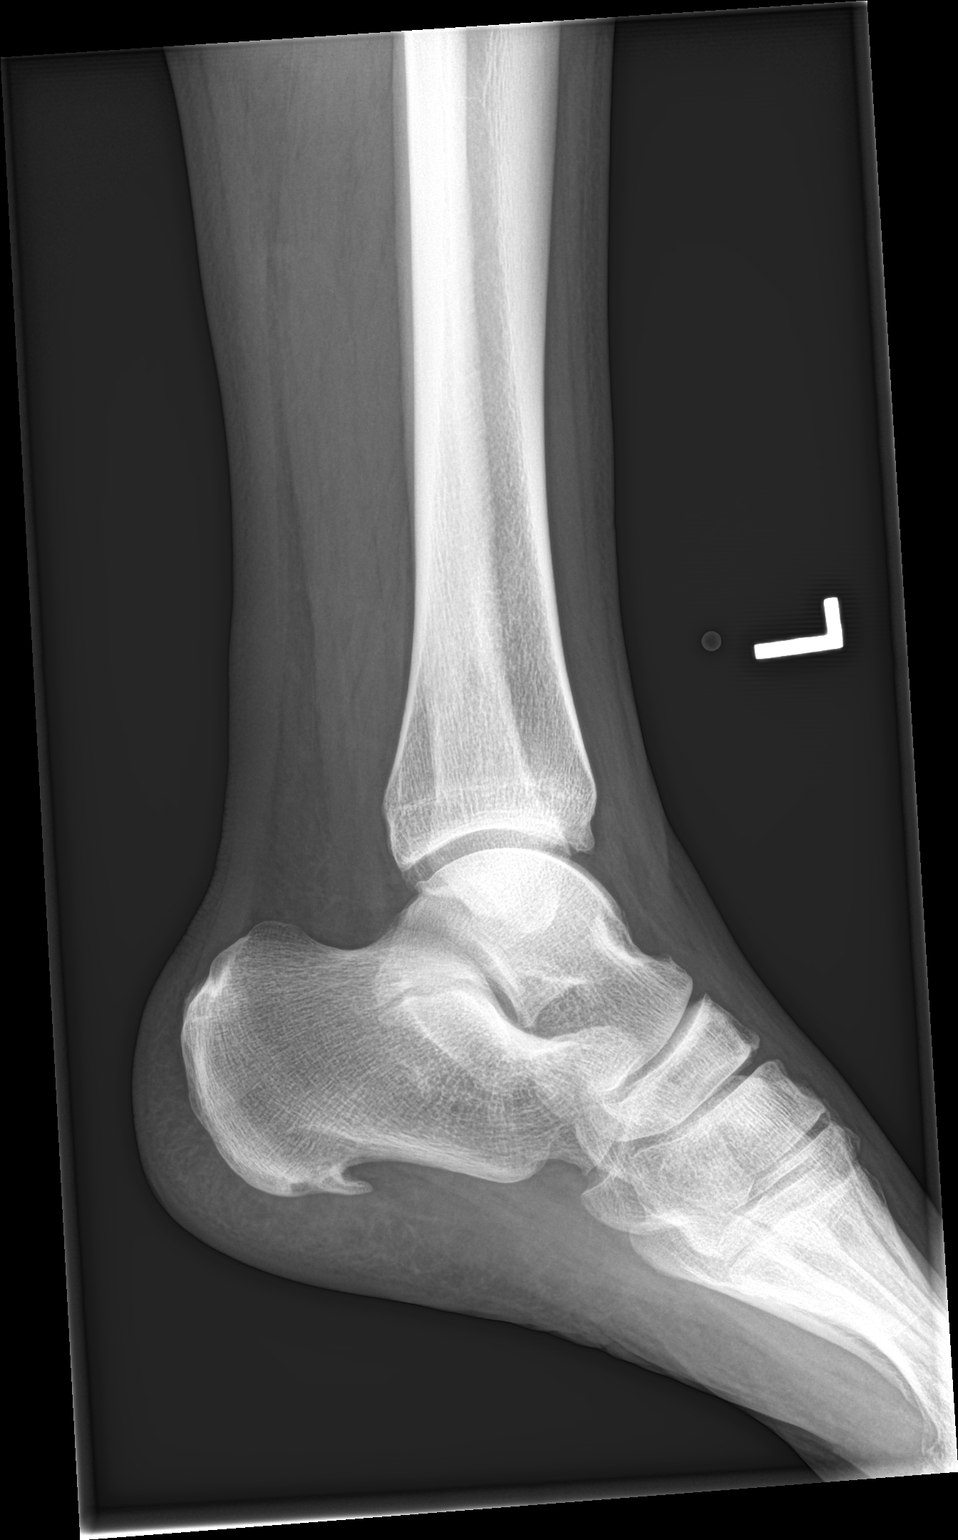

[3 of 3 positions shown; findings below may reference images not displayed]

FINDINGS: No fracture or dislocation. The alignment and joint spaces are
maintained. The ankle mortise is preserved. Minimal osteoarthritis
of the tibiotalar joint with small osteophytes. There is
degenerative change at the posterior subtalar joint. Prominent
plantar calcaneal spur.
IMPRESSION: No fracture or dislocation of the left ankle.

## 2014-11-15 MED ORDER — OXYCODONE-ACETAMINOPHEN 5-325 MG PO TABS
1.0000 | ORAL_TABLET | Freq: Once | ORAL | Status: AC
  Administered 2014-11-15: 1 via ORAL
  Filled 2014-11-15: qty 1

## 2014-11-15 MED ORDER — NAPROXEN 500 MG PO TABS: 500.0000 mg | ORAL_TABLET | Freq: Two times a day (BID) | ORAL | Status: DC

## 2014-11-15 MED ORDER — HYDROCODONE-ACETAMINOPHEN 5-325 MG PO TABS: 1.0000 | ORAL_TABLET | ORAL | Status: DC | PRN

## 2014-11-15 NOTE — ED Provider Notes (Signed)
CSN: [id number]     Arrival date & time 11/15/14  2135 History   First MD Initiated Contact with Patient 11/15/14 2208     Chief Complaint  Patient presents with  . Ankle Pain     (Consider location/radiation/quality/duration/timing/severity/associated sxs/prior Treatment) Patient is a 49 y.o. male presenting with ankle pain. The history is provided by the patient.  Ankle Pain Location:  Ankle Injury: yes   Ankle location:  L ankle Pain details:    Quality:  Shooting   Radiates to:  Does not radiate   Onset quality:  Sudden   Timing:  Constant   Progression:  Worsening Chronicity:  New Dislocation: no   Foreign body present:  No foreign bodies Prior injury to area:  No Relieved by:  Nothing Worsened by:  Bearing weight Ineffective treatments:  NSAIDs Associated symptoms: swelling    Clinton ANDRINGA is a 49 y.o. male who presents to the ED with left ankle pain s/p injury a few days ago. Patient states he was walking and stepped off a curb and twisted his ankle inward and then started walking again and turned it the other way. The pain is located on the lateral aspect of the ankle. He rates his pain as moderate to severe.  Past Medical History  Diagnosis Date  . Hypertension   . Bulging disc   . Acute pancreatitis 10/2013  . Gallstones   . Sciatica   . PTSD (post-traumatic stress disorder)    Past Surgical History  Procedure Laterality Date  . Cholecystectomy     Family History  Problem Relation Age of Onset  . Hypertension Mother   . Stroke Father   . Diabetes Other    History  Substance Use Topics  . Smoking status: Current Every Day Smoker -- 1.00 packs/day for 30 years    Types: Cigarettes  . Smokeless tobacco: Never Used  . Alcohol Use: No     Comment: occasional    Review of Systems  Musculoskeletal:       Left ankle pain  all other systems negative    Allergies  Review of patient's allergies indicates no known allergies.  Home Medications    Prior to Admission medications   Medication Sig Start Date End Date Taking? Authorizing Provider  cephALEXin (KEFLEX) 500 MG capsule Take 1 capsule (500 mg total) by mouth 4 (four) times daily. 12/04/13   Ezequiel Essex, MD  docusate sodium (STOOL SOFTENER) 100 MG capsule Take 100 mg by mouth daily.    Historical Provider, MD  HYDROcodone-acetaminophen (NORCO/VICODIN) 5-325 MG per tablet Take 1 tablet by mouth every 4 (four) hours as needed. 11/15/14   Hope Bunnie Pion, NP  lisinopril (PRINIVIL,ZESTRIL) 40 MG tablet Take 20 mg by mouth daily.    Historical Provider, MD  naproxen (NAPROSYN) 500 MG tablet Take 1 tablet (500 mg total) by mouth 2 (two) times daily. 11/15/14   Hope Bunnie Pion, NP   BP 106/60 mmHg  Pulse 84  Temp(Src) 98.1 F (36.7 C) (Oral)  Resp 18  Ht 5\' 10"  (1.778 m)  Wt 191 lb (86.637 kg)  BMI 27.41 kg/m2  SpO2 100% Physical Exam  Constitutional: He is oriented to person, place, and time. He appears well-developed and well-nourished. No distress.  HENT:  Head: Normocephalic.  Eyes: EOM are normal.  Neck: Neck supple.  Cardiovascular: Normal rate.   Pulmonary/Chest: Effort normal.  Musculoskeletal:       Left ankle: He exhibits ecchymosis. He exhibits no deformity, no  laceration and normal pulse. Decreased range of motion: due to pain. Swelling: minimal. Tenderness. Lateral malleolus tenderness found. Achilles tendon normal.       Feet:  Pedal pulses equal, adequate circulation, good touch sensation.   Neurological: He is alert and oriented to person, place, and time. No cranial nerve deficit.  Skin: Skin is warm and dry.  Psychiatric: He has a normal mood and affect. His behavior is normal.  Nursing note and vitals reviewed.   ED Course  Procedures (including critical care time) Labs Review Labs Reviewed - No data to display  Imaging Review Dg Ankle Complete Left  11/15/2014   CLINICAL DATA:  Left ankle pain after twisting injury earlier this day.  EXAM: LEFT  ANKLE COMPLETE - 3+ VIEW  COMPARISON:  None.  FINDINGS: No fracture or dislocation. The alignment and joint spaces are maintained. The ankle mortise is preserved. Minimal osteoarthritis of the tibiotalar joint with small osteophytes. There is degenerative change at the posterior subtalar joint. Prominent plantar calcaneal spur.  IMPRESSION: No fracture or dislocation of the left ankle.   Electronically Signed   By: Jeb Levering M.D.   On: 11/15/2014 22:16   ASO, crutches, pain management, ice, elevation, follow up with ortho if pain persist.   MDM  49 y.o. male with left ankle pain s/p injury. Stable for d/c without neurovascular deficits. Discussed with the patient clinical and x-ray findings and plan of care. All questioned fully answered. He will return if any problems arise.  Final diagnoses:  Ankle sprain, left, initial encounter      [facility], NP 11/16/14 0136  Dorie Rank, MD 11/16/14 1351

## 2014-11-15 NOTE — Discharge Instructions (Signed)
Do not take the narcotic if driving as it will make you sleepy. Follow up with Dr. Aline Brochure if symptoms persist.   Ankle Sprain An ankle sprain is an injury to the strong, fibrous tissues (ligaments) that hold the bones of your ankle joint together.  CAUSES An ankle sprain is usually caused by a fall or by twisting your ankle. Ankle sprains most commonly occur when you step on the outer edge of your foot, and your ankle turns inward. People who participate in sports are more prone to these types of injuries.  SYMPTOMS   Pain in your ankle. The pain may be present at rest or only when you are trying to stand or walk.  Swelling.  Bruising. Bruising may develop immediately or within 1 to 2 days after your injury.  Difficulty standing or walking, particularly when turning corners or changing directions. DIAGNOSIS  Your caregiver will ask you details about your injury and perform a physical exam of your ankle to determine if you have an ankle sprain. During the physical exam, your caregiver will press on and apply pressure to specific areas of your foot and ankle. Your caregiver will try to move your ankle in certain ways. An X-ray exam may be done to be sure a bone was not broken or a ligament did not separate from one of the bones in your ankle (avulsion fracture).  TREATMENT  Certain types of braces can help stabilize your ankle. Your caregiver can make a recommendation for this. Your caregiver may recommend the use of medicine for pain. If your sprain is severe, your caregiver may refer you to a surgeon who helps to restore function to parts of your skeletal system (orthopedist) or a physical therapist. [place] ice to your injury for 1-2 days or as directed by your caregiver. Applying ice helps to reduce inflammation and pain.  Put ice in a plastic bag.  Place a towel between your skin and the bag.  Leave the ice on for 15-20 minutes at a time, every 2 hours while you  are awake.  Only take over-the-counter or prescription medicines for pain, discomfort, or fever as directed by your caregiver.  Elevate your injured ankle above the level of your heart as much as possible for 2-3 days.  If your caregiver recommends crutches, use them as instructed. Gradually put weight on the affected ankle. Continue to use crutches or a cane until you can walk without feeling pain in your ankle.  If you have a plaster splint, wear the splint as directed by your caregiver. Do not rest it on anything harder than a pillow for the first 24 hours. Do not put weight on it. Do not get it wet. You may take it off to take a shower or bath.  You may have been given an elastic bandage to wear around your ankle to provide support. If the elastic bandage is too tight (you have numbness or tingling in your foot or your foot becomes cold and blue), adjust the bandage to make it comfortable.  If you have an air splint, you may blow more air into it or let air out to make it more comfortable. You may take your splint off at night and before taking a shower or bath. Wiggle your toes in the splint several times per day to decrease swelling. SEEK MEDICAL CARE IF:   You have rapidly increasing bruising or swelling.  Your toes feel extremely cold or you lose feeling  in your foot.  Your pain is not relieved with medicine. SEEK IMMEDIATE MEDICAL CARE IF:  Your toes are numb or blue.  You have severe pain that is increasing. MAKE SURE YOU:   Understand these instructions.  Will watch your condition.  Will get help right away if you are not doing well or get worse. Document Released: 09/09/2005 Document Revised: 06/03/2012 Document Reviewed: 09/21/2011 [facility] Patient Information 2015 [place], [place]. This information is not intended to replace advice given to you by your health care provider. Make sure you discuss any questions you have with your health care provider.

## 2014-11-15 NOTE — ED Notes (Signed)
Pt alert & oriented x4. Patient given discharge instructions, paperwork & prescription(s). Patient informed not to drive, operate any equipment & handel any important documents 4 hours after taking pain medication. Patient  instructed to stop at the registration desk to finish any additional paperwork. Patient verbalized understanding. Pt left department w/ no further questions.

## 2014-11-15 NOTE — ED Notes (Signed)
Pain, swelling lt ankle. "twisted" ankle

## 2015-04-12 ENCOUNTER — Emergency Department (HOSPITAL_COMMUNITY): Payer: Non-veteran care

## 2015-04-12 ENCOUNTER — Emergency Department (HOSPITAL_COMMUNITY)
Admission: EM | Admit: 2015-04-12 | Discharge: 2015-04-12 | Disposition: A | Discharge status: Home / Self Care | Payer: Non-veteran care | Attending: Emergency Medicine | Admitting: Emergency Medicine

## 2015-04-12 ENCOUNTER — Encounter (HOSPITAL_COMMUNITY): Payer: Self-pay | Admitting: Emergency Medicine

## 2015-04-12 DIAGNOSIS — Z791 Long term (current) use of non-steroidal anti-inflammatories (NSAID): Secondary | ICD-10-CM | POA: Diagnosis not present

## 2015-04-12 DIAGNOSIS — Z792 Long term (current) use of antibiotics: Secondary | ICD-10-CM | POA: Diagnosis not present

## 2015-04-12 DIAGNOSIS — Z79899 Other long term (current) drug therapy: Secondary | ICD-10-CM | POA: Diagnosis not present

## 2015-04-12 DIAGNOSIS — Z720 Tobacco use: Secondary | ICD-10-CM | POA: Diagnosis not present

## 2015-04-12 DIAGNOSIS — Z8719 Personal history of other diseases of the digestive system: Secondary | ICD-10-CM | POA: Diagnosis not present

## 2015-04-12 DIAGNOSIS — M5430 Sciatica, unspecified side: Secondary | ICD-10-CM | POA: Diagnosis not present

## 2015-04-12 DIAGNOSIS — Y9289 Other specified places as the place of occurrence of the external cause: Secondary | ICD-10-CM | POA: Insufficient documentation

## 2015-04-12 DIAGNOSIS — S01112A Laceration without foreign body of left eyelid and periocular area, initial encounter: Secondary | ICD-10-CM | POA: Insufficient documentation

## 2015-04-12 DIAGNOSIS — S0990XA Unspecified injury of head, initial encounter: Secondary | ICD-10-CM | POA: Insufficient documentation

## 2015-04-12 DIAGNOSIS — Y998 Other external cause status: Secondary | ICD-10-CM | POA: Insufficient documentation

## 2015-04-12 DIAGNOSIS — Z8659 Personal history of other mental and behavioral disorders: Secondary | ICD-10-CM | POA: Insufficient documentation

## 2015-04-12 DIAGNOSIS — Y9389 Activity, other specified: Secondary | ICD-10-CM | POA: Insufficient documentation

## 2015-04-12 DIAGNOSIS — I10 Essential (primary) hypertension: Secondary | ICD-10-CM | POA: Insufficient documentation
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
## 2015-04-12 MED ORDER — LIDOCAINE HCL (PF) 1 % IJ SOLN
5.0000 mL | Freq: Once | INTRAMUSCULAR | Status: DC
  Filled 2015-04-12: qty 5

## 2015-04-12 NOTE — Discharge Instructions (Signed)
Assault, General °Assault includes any behavior, whether intentional or reckless, which results in bodily injury to another person and/or damage to property. Included in this would be any behavior, intentional or reckless, that by its nature would be understood (interpreted) by a reasonable person as intent to harm another person or to damage his/her property. Threats may be oral or written. They may be communicated through regular mail, computer, fax, or phone. These threats may be direct or implied. °FORMS OF ASSAULT INCLUDE: °· Physically assaulting a person. This includes physical threats to inflict physical harm as well as: °¨ Slapping. °¨ Hitting. °¨ Poking. °¨ Kicking. °¨ Punching. °¨ Pushing. °· Arson. °· Sabotage. °· Equipment vandalism. °· Damaging or destroying property. °· Throwing or hitting objects. °· Displaying a weapon or an object that appears to be a weapon in a threatening manner. °¨ Carrying a firearm of any kind. °¨ Using a weapon to harm someone. °· Using greater physical size/strength to intimidate another. °¨ Making intimidating or threatening gestures. °¨ Bullying. °¨ Hazing. °· Intimidating, threatening, hostile, or abusive language directed toward another person. °¨ It communicates the intention to engage in violence against that person. And it leads a reasonable person to expect that violent behavior may occur. °· Stalking another person. °IF IT HAPPENS AGAIN: °· Immediately call for emergency help (911 in [place] °· If someone poses clear and immediate danger to you, seek legal authorities to have a protective or restraining order put in place. °· Less threatening assaults can at least be reported to authorities. °STEPS TO TAKE IF A SEXUAL ASSAULT HAS HAPPENED °· Go to an area of safety. This may include a shelter or staying with a friend. Stay away from the area where you have been attacked. A large percentage of sexual assaults are caused by a friend, relative or associate. °· If  medications were given by your caregiver, take them as directed for the full length of time prescribed. °· Only take over-the-counter or prescription medicines for pain, discomfort, or fever as directed by your caregiver. °· If you have come in contact with a sexual disease, find out if you are to be tested again. If your caregiver is concerned about the HIV/AIDS virus, he/she may require you to have continued testing for several months. °· For the protection of your privacy, test results can not be given over the phone. Make sure you receive the results of your test. If your test results are not back during your visit, make an appointment with your caregiver to find out the results. Do not assume everything is normal if you have not heard from your caregiver or the medical facility. It is important for you to follow up on all of your test results. °· File appropriate papers with authorities. This is important in all assaults, even if it has occurred in a family or by a friend. °SEEK MEDICAL CARE IF: °· You have new problems because of your injuries. °· You have problems that may be because of the medicine you are taking, such as: °¨ Rash. °¨ Itching. °¨ Swelling. °¨ Trouble breathing. °· You develop belly (abdominal) pain, feel sick to your stomach (nausea) or are vomiting. °· You begin to run a temperature. °· You need supportive care or referral to a rape crisis center. These are centers with trained personnel who can help you get through this ordeal. °SEEK IMMEDIATE MEDICAL CARE IF: °· You are afraid of being threatened, beaten, or abused. In [place] call 911. °· You   receive new injuries related to abuse.  You develop severe pain in any area injured in the assault or have any change in your condition that concerns you.  You faint or lose consciousness.  You develop chest pain or shortness of breath. Document Released: 09/09/2005 Document Revised: 12/02/2011 Document Reviewed: 04/27/2008 [facility] Patient  Information 2015 [place], [place]. This information is not intended to replace advice given to you by your health care provider. Make sure you discuss any questions you have with your health care provider. Facial Laceration  A facial laceration is a cut on the face. These injuries can be painful and cause bleeding. Lacerations usually heal quickly, but they need special care to reduce scarring. DIAGNOSIS  Your health care provider will take a medical history, ask for details about how the injury occurred, and examine the wound to determine how deep the cut is. TREATMENT  Some facial lacerations may not require closure. Others may not be able to be closed because of an increased risk of infection. The risk of infection and the chance for successful closure will depend on various factors, including the amount of time since the injury occurred. The wound may be cleaned to help prevent infection. If closure is appropriate, pain medicines may be given if needed. Your health care provider will use stitches (sutures), wound glue (adhesive), or skin adhesive strips to repair the laceration. These tools bring the skin edges together to allow for faster healing and a better cosmetic outcome. If needed, you may also be given a tetanus shot. HOME CARE INSTRUCTIONS Only take over-the-counter or prescription medicines as directed by your health care provider. Follow your health care provider's instructions for wound care. These instructions will vary depending on the technique used for closing the wound. For Sutures: Keep the wound clean and dry.  If you were given a bandage (dressing), you should change it at least once a day. Also change the dressing if it becomes wet or dirty, or as directed by your health care provider.  Wash the wound with soap and water 2 times a day. Rinse the wound off with water to remove all soap. Pat the wound dry with a clean towel.  After cleaning, apply a thin layer of the antibiotic  ointment recommended by your health care provider. This will help prevent infection and keep the dressing from sticking.  You may shower as usual after the first 24 hours. Do not soak the wound in water until the sutures are removed.  Get your sutures removed as directed by your health care provider. With facial lacerations, sutures should usually be taken out after 4-5 days to avoid stitch marks.  Wait a few days after your sutures are removed before applying any makeup. For Skin Adhesive Strips: Keep the wound clean and dry.  Do not get the skin adhesive strips wet. You may bathe carefully, using caution to keep the wound dry.  If the wound gets wet, pat it dry with a clean towel.  Skin adhesive strips will fall off on their own. You may trim the strips as the wound heals. Do not remove skin adhesive strips that are still stuck to the wound. They will fall off in time.  For Wound Adhesive: You may briefly wet your wound in the shower or bath. Do not soak or scrub the wound. Do not swim. Avoid periods of heavy sweating until the skin adhesive has fallen off on its own. After showering or bathing, gently pat the wound dry with  a clean towel.  Do not apply liquid medicine, cream medicine, ointment medicine, or makeup to your wound while the skin adhesive is in place. This may loosen the film before your wound is healed.  If a dressing is placed over the wound, be careful not to apply tape directly over the skin adhesive. This may cause the adhesive to be pulled off before the wound is healed.  Avoid prolonged exposure to sunlight or tanning lamps while the skin adhesive is in place. The skin adhesive will usually remain in place for 5-10 days, then naturally fall off the skin. Do not pick at the adhesive film.  After Healing: Once the wound has healed, cover the wound with sunscreen during the day for 1 full year. This can help minimize scarring. Exposure to ultraviolet light in the first  year will darken the scar. It can take 1-2 years for the scar to lose its redness and to heal completely.  SEEK IMMEDIATE MEDICAL CARE IF: You have redness, pain, or swelling around the wound.  You see ayellowish-white fluid (pus) coming from the wound.  You have chills or a fever.  MAKE SURE YOU: Understand these instructions. Will watch your condition. Will get help right away if you are not doing well or get worse. Document Released: 10/17/2004 Document Revised: 06/30/2013 Document Reviewed: 04/22/2013 [facility] Patient Information 2015 [place], [place]. This information is not intended to replace advice given to you by your health care provider. Make sure you discuss any questions you have with your health care provider.
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
## 2015-08-06 ENCOUNTER — Encounter (HOSPITAL_COMMUNITY): Payer: Self-pay | Admitting: *Deleted

## 2015-08-06 ENCOUNTER — Emergency Department (HOSPITAL_COMMUNITY)
Admission: EM | Admit: 2015-08-06 | Discharge: 2015-08-06 | Disposition: A | Discharge status: Home / Self Care | Payer: Non-veteran care | Attending: Emergency Medicine | Admitting: Emergency Medicine

## 2015-08-06 DIAGNOSIS — K029 Dental caries, unspecified: Secondary | ICD-10-CM | POA: Insufficient documentation

## 2015-08-06 DIAGNOSIS — I10 Essential (primary) hypertension: Secondary | ICD-10-CM | POA: Diagnosis not present

## 2015-08-06 DIAGNOSIS — F17210 Nicotine dependence, cigarettes, uncomplicated: Secondary | ICD-10-CM | POA: Diagnosis not present

## 2015-08-06 DIAGNOSIS — M5430 Sciatica, unspecified side: Secondary | ICD-10-CM | POA: Insufficient documentation

## 2015-08-06 DIAGNOSIS — Z791 Long term (current) use of non-steroidal anti-inflammatories (NSAID): Secondary | ICD-10-CM | POA: Diagnosis not present

## 2015-08-06 DIAGNOSIS — Z79899 Other long term (current) drug therapy: Secondary | ICD-10-CM | POA: Insufficient documentation

## 2015-08-06 DIAGNOSIS — Z8719 Personal history of other diseases of the digestive system: Secondary | ICD-10-CM | POA: Diagnosis not present

## 2015-08-06 DIAGNOSIS — R110 Nausea: Secondary | ICD-10-CM | POA: Diagnosis not present

## 2015-08-06 DIAGNOSIS — R000 Tachycardia, unspecified: Secondary | ICD-10-CM | POA: Insufficient documentation

## 2015-08-06 DIAGNOSIS — K002 Abnormalities of size and form of teeth: Secondary | ICD-10-CM | POA: Diagnosis not present

## 2015-08-06 DIAGNOSIS — K0889 Other specified disorders of teeth and supporting structures: Secondary | ICD-10-CM | POA: Diagnosis present

## 2015-08-06 DIAGNOSIS — K047 Periapical abscess without sinus: Secondary | ICD-10-CM | POA: Diagnosis not present

## 2015-08-06 MED ORDER — AMOXICILLIN 500 MG PO CAPS: 500.0000 mg | ORAL_CAPSULE | Freq: Three times a day (TID) | ORAL | Status: DC

## 2015-08-06 MED ORDER — CEFTRIAXONE SODIUM 1 G IJ SOLR
1.0000 g | Freq: Once | INTRAMUSCULAR | Status: AC
  Administered 2015-08-06: 1 g via INTRAMUSCULAR
  Filled 2015-08-06: qty 10

## 2015-08-06 MED ORDER — IBUPROFEN 800 MG PO TABS
800.0000 mg | ORAL_TABLET | Freq: Once | ORAL | Status: AC
  Administered 2015-08-06: 800 mg via ORAL
  Filled 2015-08-06: qty 1

## 2015-08-06 MED ORDER — ONDANSETRON HCL 4 MG PO TABS
4.0000 mg | ORAL_TABLET | Freq: Once | ORAL | Status: AC
  Administered 2015-08-06: 4 mg via ORAL
  Filled 2015-08-06: qty 1

## 2015-08-06 MED ORDER — TRAMADOL HCL 50 MG PO TABS
50.0000 mg | ORAL_TABLET | Freq: Once | ORAL | Status: AC
Start: 2015-08-06 — End: 2015-08-06
  Administered 2015-08-06: 50 mg via ORAL
  Filled 2015-08-06: qty 1

## 2015-08-06 MED ORDER — LIDOCAINE HCL (PF) 1 % IJ SOLN
INTRAMUSCULAR | Status: AC
  Filled 2015-08-06: qty 5

## 2015-08-06 NOTE — ED Provider Notes (Signed)
History  By signing my name below, I, Marlowe Kays, attest that this documentation has been prepared under the direction and in the presence of Lily Kocher, PA-C. Electronically Signed: Marlowe Kays, ED Scribe. 08/06/2015. 5:26 PM.  Chief Complaint  Patient presents with  . Dental Pain   The history is provided by the patient and medical records. No language interpreter was used.    HPI Comments:  Clinton Ross is a 49 y.o. male who presents to the Emergency Department complaining of severe right upper dental pain that began two days ago. He reports associated subjective fever, nausea, facial swelling and congestion of the right nares. He states he called the dentist but was unable to reach one due to it being the weekend. He has not done anything to treat the symptoms. Talking worsens the pain. He denies alleviating factors.  He denies vomiting. He denies allergies to any medications.   Past Medical History  Diagnosis Date  . Hypertension   . Bulging disc   . Acute pancreatitis 10/2013  . Gallstones   . Sciatica   . PTSD (post-traumatic stress disorder)    Past Surgical History  Procedure Laterality Date  . Cholecystectomy     Family History  Problem Relation Age of Onset  . Hypertension Mother   . Stroke Father   . Diabetes Other    Social History  Substance Use Topics  . Smoking status: Current Every Day Smoker -- 1.00 packs/day for 30 years    Types: Cigarettes  . Smokeless tobacco: Never Used  . Alcohol Use: No    Review of Systems  Constitutional: Positive for fever (subjective).  HENT: Positive for dental problem and facial swelling.   Gastrointestinal: Positive for nausea. Negative for vomiting.    Allergies  Review of patient's allergies indicates no known allergies.  Home Medications   Prior to Admission medications   Medication Sig Start Date End Date Taking? Authorizing Provider  cephALEXin (KEFLEX) 500 MG capsule Take 1 capsule (500 mg  total) by mouth 4 (four) times daily. 12/04/13   Ezequiel Essex, MD  docusate sodium (STOOL SOFTENER) 100 MG capsule Take 100 mg by mouth daily.    Historical Provider, MD  HYDROcodone-acetaminophen (NORCO/VICODIN) 5-325 MG per tablet Take 1 tablet by mouth every 4 (four) hours as needed. 11/15/14   Hope Bunnie Pion, NP  lisinopril (PRINIVIL,ZESTRIL) 40 MG tablet Take 20 mg by mouth daily.    Historical Provider, MD  naproxen (NAPROSYN) 500 MG tablet Take 1 tablet (500 mg total) by mouth 2 (two) times daily. 11/15/14   Hope Bunnie Pion, NP   Triage Vitals: BP 146/87 mmHg  Pulse 100  Temp(Src) 98.3 F (36.8 C) (Oral)  Resp 16  Ht 5\' 10"  (1.778 m)  Wt 207 lb (93.895 kg)  BMI 29.70 kg/m2  SpO2 97% Physical Exam  Constitutional: He is oriented to person, place, and time. He appears well-developed and well-nourished.  HENT:  Head: Normocephalic and atraumatic.  Mouth/Throat: Uvula is midline, oropharynx is clear and moist and mucous membranes are normal. No trismus in the jaw. Abnormal dentition. Dental abscesses and dental caries present.  No abnormality of the mastoid area. Mild to moderate swelling to right submental area. Multiple dental carried of lower jaw with swelling. Abscess noted of upper right jaw. Multiple dental carries of right upper jaw.  Eyes: EOM are normal.  Neck: Normal range of motion.  Cardiovascular: Regular rhythm and normal heart sounds.  Tachycardia present.  Exam reveals no gallop  and no friction rub.   No murmur heard. Pulmonary/Chest: Effort normal and breath sounds normal. No respiratory distress.  Symmetrical rise and fall of the chest. Course breath sounds. Speaks in complete sentences.  Musculoskeletal: Normal range of motion.  Lymphadenopathy:  Palpable submental lymphadenopathy.  Neurological: He is alert and oriented to person, place, and time.  Skin: Skin is warm and dry.  Psychiatric: He has a normal mood and affect. His behavior is normal.  Nursing note and  vitals reviewed.   ED Course  Procedures (including critical care time) DIAGNOSTIC STUDIES: Oxygen Saturation is 97% on RA, normal by my interpretation.   COORDINATION OF CARE: 5:21 PM- Will order injection of Rocephin 1g and send home with a prescription of Amoxicillin and Tramdol. Advised pt to follow up with dentist as soon as possible. Pt verbalizes understanding and agrees to plan.  Medications - No data to display    MDM  Pt noted to have a tachycardia, o/w vital signs stable. No trismus. No evidence for Ludwig's angina. No drainable abscess. Pt strongly advised to see a dentist as soon as possible.   Final diagnoses:  None    *I have reviewed nursing notes, vital signs, and all appropriate lab and imaging results for this patient.  I personally performed the services described in this documentation, which was scribed in my presence. The recorded information has been reviewed and is accurate.    Lily Kocher, PA-C 08/06/15 1730  Dorie Rank, MD 08/06/15 [phone]

## 2015-08-06 NOTE — Discharge Instructions (Signed)
Please use 600mg  of ibuprofen and ultram every 6 hours for pain. Use amoxil three times daily until all taken. Please see your dentist as soon as possible. Dental Abscess A dental abscess is a collection of pus in or around a tooth. CAUSES This condition is caused by a bacterial infection around the root of the tooth that involves the inner part of the tooth (pulp). It may result from:  Severe tooth decay.  Trauma to the tooth that allows bacteria to enter into the pulp, such as a broken or chipped tooth.  Severe gum disease around a tooth. SYMPTOMS Symptoms of this condition include:  Severe pain in and around the infected tooth.  Swelling and redness around the infected tooth, in the mouth, or in the face.  Tenderness.  Pus drainage.  Bad breath.  Bitter taste in the mouth.  Difficulty swallowing.  Difficulty opening the mouth.  Nausea.  Vomiting.  Chills.  Swollen neck glands.  Fever. DIAGNOSIS This condition is diagnosed with examination of the infected tooth. During the exam, your dentist may tap on the infected tooth. Your dentist will also ask about your medical and dental history and may order X-rays. TREATMENT This condition is treated by eliminating the infection. This may be done with:  Antibiotic medicine.  A root canal. This may be performed to save the tooth.  Pulling (extracting) the tooth. This may also involve draining the abscess. This is done if the tooth cannot be saved. HOME CARE INSTRUCTIONS  Take medicines only as directed by your dentist.  If you were prescribed antibiotic medicine, finish all of it even if you start to feel better.  Rinse your mouth (gargle) often with salt water to relieve pain or swelling.  Do not drive or operate heavy machinery while taking pain medicine.  Do not apply heat to the outside of your mouth.  Keep all follow-up visits as directed by your dentist. This is important. SEEK MEDICAL CARE IF:  Your  pain is worse and is not helped by medicine. SEEK IMMEDIATE MEDICAL CARE IF:  You have a fever or chills.  Your symptoms suddenly get worse.  You have a very bad headache.  You have problems breathing or swallowing.  You have trouble opening your mouth.  You have swelling in your neck or around your eye.   This information is not intended to replace advice given to you by your health care provider. Make sure you discuss any questions you have with your health care provider.   Document Released: 09/09/2005 Document Revised: 01/24/2015 Document Reviewed: 09/06/2014 Elsevier Interactive Patient Education [employer].

## 2015-08-06 NOTE — ED Notes (Signed)
Pt began having right side dental pain starting Friday night. Pain has worsening and moved into his sinuses. Pt denies any trouble breathing.

## 2015-12-30 ENCOUNTER — Emergency Department (HOSPITAL_COMMUNITY)
Admission: EM | Admit: 2015-12-30 | Discharge: 2015-12-30 | Disposition: A | Discharge status: Home / Self Care | Payer: Non-veteran care | Attending: Emergency Medicine | Admitting: Emergency Medicine

## 2015-12-30 ENCOUNTER — Encounter (HOSPITAL_COMMUNITY): Payer: Self-pay | Admitting: Emergency Medicine

## 2015-12-30 DIAGNOSIS — F17210 Nicotine dependence, cigarettes, uncomplicated: Secondary | ICD-10-CM | POA: Insufficient documentation

## 2015-12-30 DIAGNOSIS — M5442 Lumbago with sciatica, left side: Secondary | ICD-10-CM

## 2015-12-30 DIAGNOSIS — I10 Essential (primary) hypertension: Secondary | ICD-10-CM | POA: Diagnosis not present

## 2015-12-30 DIAGNOSIS — M545 Low back pain: Secondary | ICD-10-CM | POA: Diagnosis present

## 2015-12-30 MED ORDER — PREDNISONE 50 MG PO TABS: ORAL_TABLET | ORAL | Status: DC

## 2015-12-30 MED ORDER — HYDROMORPHONE HCL 1 MG/ML IJ SOLN
1.0000 mg | Freq: Once | INTRAMUSCULAR | Status: AC
  Administered 2015-12-30: 1 mg via INTRAMUSCULAR
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
## 2015-12-30 NOTE — ED Provider Notes (Signed)
CSN: [id number]     Arrival date & time 12/30/15  1618 History   First MD Initiated Contact with Patient 12/30/15 1627     Chief Complaint  Patient presents with  . Back Pain     (Consider location/radiation/quality/duration/timing/severity/associated sxs/prior Treatment) HPI.... Pain in right mid lateral back radiating to lumbar spine for 2 months. Status post injections in his lumbar spine on 11/21/2015 at the [facility] in [place], [place]. He has been taking hydrocodone tablets for the pain along with gabapentin and indomethacin. He is ambulatory. No bowel or bladder incontinence. Nothing makes symptoms better or worse. Pain is moderate. Review systems positive for tenderness in the distal medial left thigh area. No erythema.  Past Medical History  Diagnosis Date  . Hypertension   . Bulging disc   . Acute pancreatitis 10/2013  . Gallstones   . Sciatica   . PTSD (post-traumatic stress disorder)    Past Surgical History  Procedure Laterality Date  . Cholecystectomy     Family History  Problem Relation Age of Onset  . Hypertension Mother   . Stroke Father   . Diabetes Other    Social History  Substance Use Topics  . Smoking status: Current Every Day Smoker -- 1.00 packs/day for 30 years    Types: Cigarettes  . Smokeless tobacco: Never Used  . Alcohol Use: No    Review of Systems  All other systems reviewed and are negative.     Allergies  Morphine and related  Home Medications   Prior to Admission medications   Medication Sig Start Date End Date Taking? Authorizing Provider  amoxicillin (AMOXIL) 500 MG capsule Take 1 capsule (500 mg total) by mouth 3 (three) times daily. 08/06/15   Lily Kocher, PA-C  cyclobenzaprine (FLEXERIL) 10 MG tablet Take 10 mg by mouth 3 (three) times daily as needed for muscle spasms.    Historical Provider, MD  lisinopril (PRINIVIL,ZESTRIL) 40 MG tablet Take 20 mg by mouth daily.    Historical Provider, MD  predniSONE (DELTASONE) 50 MG tablet 1  tab for 7 days, one half tab for 7 days 12/30/15   [name], MD  traMADol (ULTRAM) 50 MG tablet Take by mouth every 6 (six) hours as needed.    Historical Provider, MD   BP 113/71 mmHg  Pulse 83  Temp(Src) 99 F (37.2 C) (Oral)  Resp 18  Ht 5\' 10"  (1.778 m)  Wt 201 lb (91.173 kg)  BMI 28.84 kg/m2  SpO2 99% Physical Exam  Constitutional: He is oriented to person, place, and time. He appears well-developed and well-nourished.  HENT:  Head: Normocephalic and atraumatic.  Eyes: Conjunctivae and EOM are normal. Pupils are equal, round, and reactive to light.  Neck: Normal range of motion. Neck supple.  Cardiovascular: Normal rate and regular rhythm.   Pulmonary/Chest: Effort normal and breath sounds normal.  Abdominal: Soft. Bowel sounds are normal.  Musculoskeletal:  Muscular tenderness right back at approximately T10 area. Also tender in the peri-lower lumbar spine  Neurological: He is alert and oriented to person, place, and time.  Skin: Skin is warm and dry.  Superficial cord in the distal medial left thigh.  Psychiatric: He has a normal mood and affect. His behavior is normal.  Nursing note and vitals reviewed.   ED Course  Procedures (including critical care time) Labs Review Labs Reviewed - No data to display  Imaging Review No results found. I have personally reviewed and evaluated these images and lab results as part of  my medical decision-making.   EKG Interpretation None      MDM   Final diagnoses:  Midline low back pain with left-sided sciatica    Dilaudid 1 mg IM. Discharge medication prednisone.  I offered the patient a Doppler study of his left lower extremity to rule out a DVT. He preferred to discuss this with his primary care physician.    [name], MD 12/30/15 [phone]

## 2015-12-30 NOTE — Discharge Instructions (Signed)
To further evaluate your left leg, you need a Doppler test or ultrasound of the leg. Prescription for prednisone which is a potent anti-inflammatory agent. Continue pain pills as needed.

## 2016-07-21 ENCOUNTER — Emergency Department (HOSPITAL_COMMUNITY): Payer: Non-veteran care

## 2016-07-21 ENCOUNTER — Encounter (HOSPITAL_COMMUNITY): Payer: Self-pay | Admitting: Emergency Medicine

## 2016-07-21 ENCOUNTER — Observation Stay (HOSPITAL_COMMUNITY)
Admission: EM | Admit: 2016-07-21 | Discharge: 2016-07-22 | Disposition: A | Discharge status: Home / Self Care | Payer: Non-veteran care | Attending: Internal Medicine | Admitting: Internal Medicine

## 2016-07-21 DIAGNOSIS — I10 Essential (primary) hypertension: Secondary | ICD-10-CM | POA: Diagnosis present

## 2016-07-21 DIAGNOSIS — R079 Chest pain, unspecified: Secondary | ICD-10-CM | POA: Diagnosis not present

## 2016-07-21 DIAGNOSIS — R0602 Shortness of breath: Secondary | ICD-10-CM | POA: Insufficient documentation

## 2016-07-21 DIAGNOSIS — Z79899 Other long term (current) drug therapy: Secondary | ICD-10-CM | POA: Diagnosis not present

## 2016-07-21 DIAGNOSIS — R110 Nausea: Secondary | ICD-10-CM | POA: Diagnosis not present

## 2016-07-21 DIAGNOSIS — F17210 Nicotine dependence, cigarettes, uncomplicated: Secondary | ICD-10-CM | POA: Diagnosis not present

## 2016-07-21 DIAGNOSIS — R0789 Other chest pain: Secondary | ICD-10-CM | POA: Diagnosis present

## 2016-07-21 DIAGNOSIS — Z720 Tobacco use: Secondary | ICD-10-CM | POA: Diagnosis not present

## 2016-07-21 HISTORY — DX: Malignant (primary) neoplasm, unspecified: C80.1

## 2016-07-21 LAB — BASIC METABOLIC PANEL
Anion gap: 7 (ref 5–15)
BUN: 11 mg/dL (ref 6–20)
CALCIUM: 9.1 mg/dL (ref 8.9–10.3)
CO2: 27 mmol/L (ref 22–32)
CREATININE: 0.96 mg/dL (ref 0.61–1.24)
Chloride: 103 mmol/L (ref 101–111)
GFR calc Af Amer: >60 mL/min (ref >60)
GLUCOSE: 82 mg/dL (ref 65–99)
Potassium: 3.5 mmol/L (ref 3.5–5.1)
Sodium: 137 mmol/L (ref 135–145)

## 2016-07-21 LAB — CBC
HEMATOCRIT: 42.4 % (ref 39.0–52.0)
Hemoglobin: 14.4 g/dL (ref 13.0–17.0)
MCH: 30.8 pg (ref 26.0–34.0)
MCHC: 34 g/dL (ref 30.0–36.0)
MCV: 90.6 fL (ref 78.0–100.0)
PLATELETS: 154 10*3/uL (ref 150–400)
RBC: 4.68 MIL/uL (ref 4.22–5.81)
RDW: 20.6 % — AB (ref 11.5–15.5)
WBC: 7.1 10*3/uL (ref 4.0–10.5)

## 2016-07-21 LAB — I-STAT TROPONIN, ED: TROPONIN I, POC: 0 ng/mL (ref 0.00–0.08)

## 2016-07-21 LAB — D-DIMER, QUANTITATIVE: D-Dimer, Quant: 1.04 ug/mL-FEU — ABNORMAL HIGH (ref 0.00–0.50)

## 2016-07-21 IMAGING — DX DG CHEST 2V
2 series · 2 of 2 positions shown · non-contrast
Comparison: Radiographs [DATE].

CLINICAL DATA: Chest pain.

EXAM:
CHEST  2 VIEW

[chest pa]
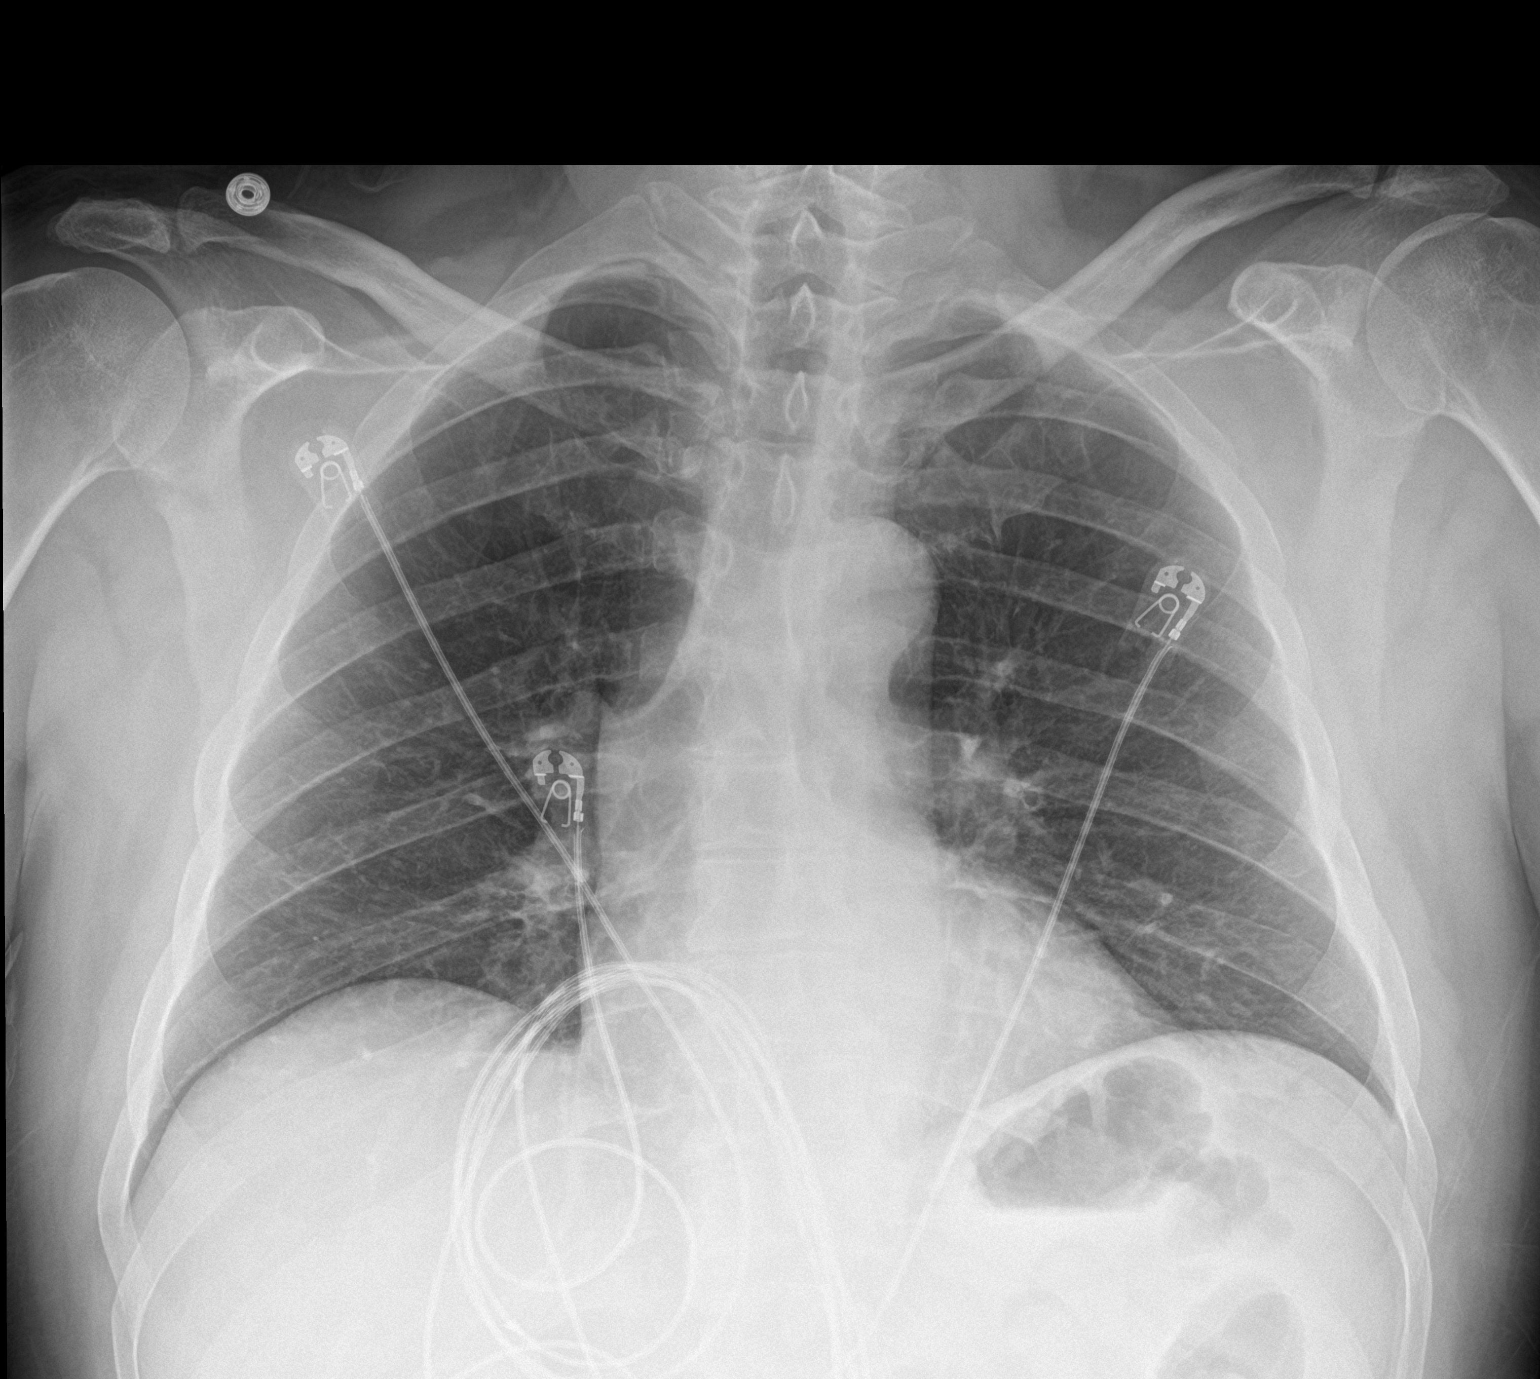

[chest lat]
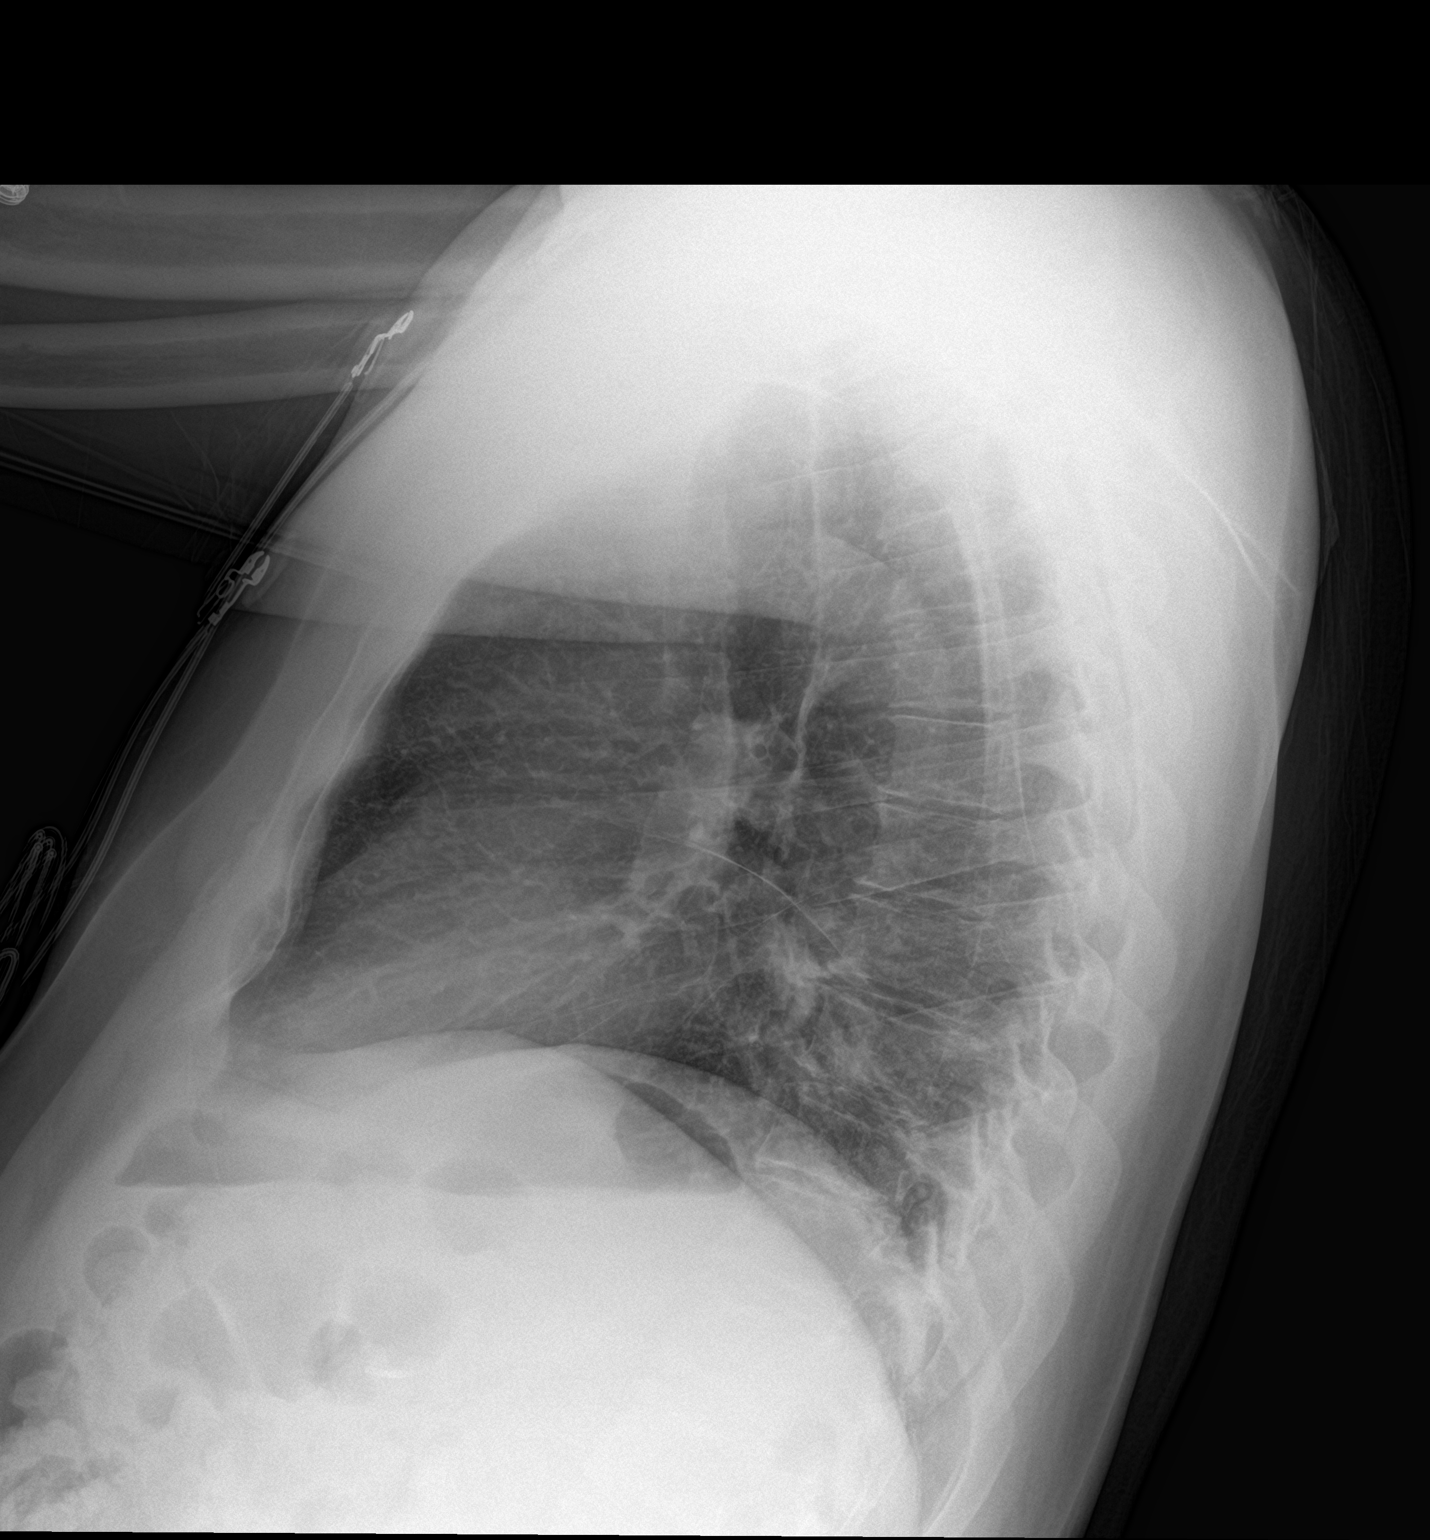

[2 of 2 positions shown; findings below may reference images not displayed]

FINDINGS: The heart size and mediastinal contours are within normal limits.
Both lungs are clear. No pneumothorax or pleural effusion is noted.
The visualized skeletal structures are unremarkable.
IMPRESSION: No active cardiopulmonary disease.

## 2016-07-21 IMAGING — CT CT ANGIO CHEST
2 of 6 series · 18 of 46 positions shown · IV contrast (APPLIED)
Comparison: Chest radiographs dated [DATE]

CLINICAL DATA: Left chest pain

EXAM:
CT ANGIOGRAPHY CHEST WITH CONTRAST
TECHNIQUE: Multidetector CT imaging of the chest was performed using the
standard protocol during bolus administration of intravenous
contrast. Multiplanar CT image reconstructions and MIPs were
obtained to evaluate the vascular anatomy.
CONTRAST:  100 mL Isovue 370 IV

[Series 5: thins · axial · 0.77mm/px · z∈[-284,-47]mm · 15 of 261 slices shown]
[im 12/261  lung]
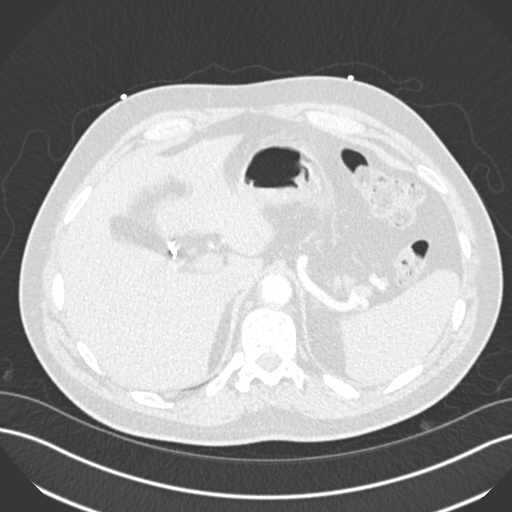
[im 34/261  soft-tissue]
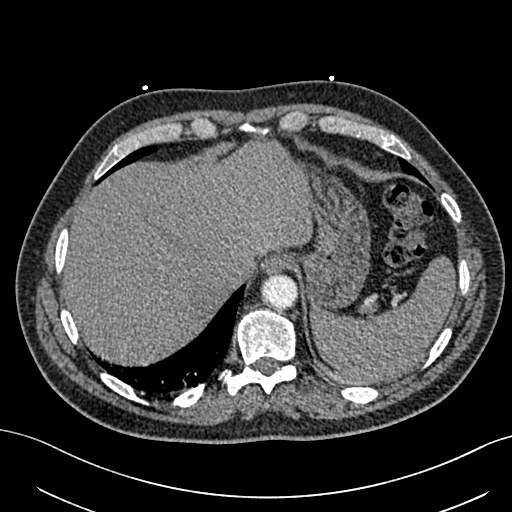
[im 46/261  lung]
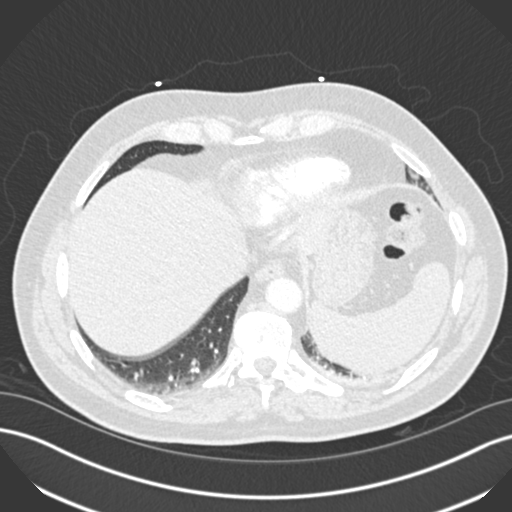
[im 68/261  soft-tissue]
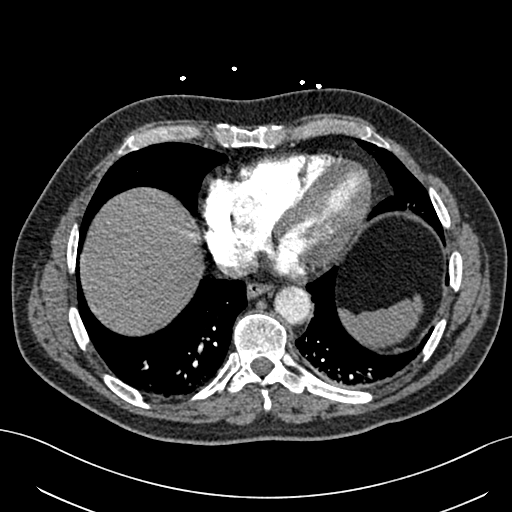
[im 80/261  lung]
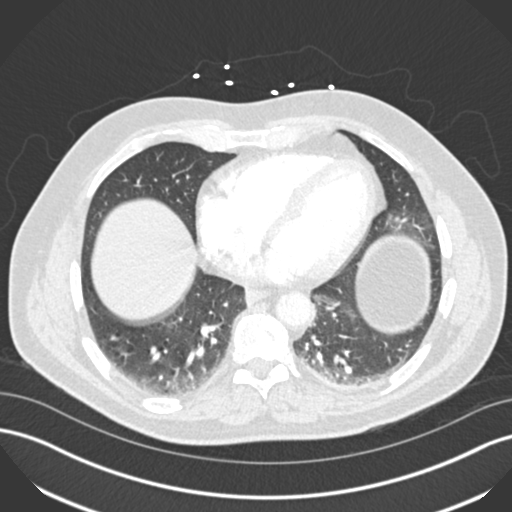
[im 102/261  soft-tissue]
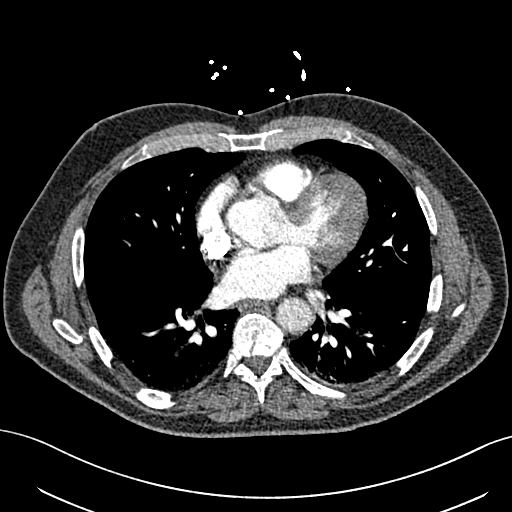
[im 114/261  lung]
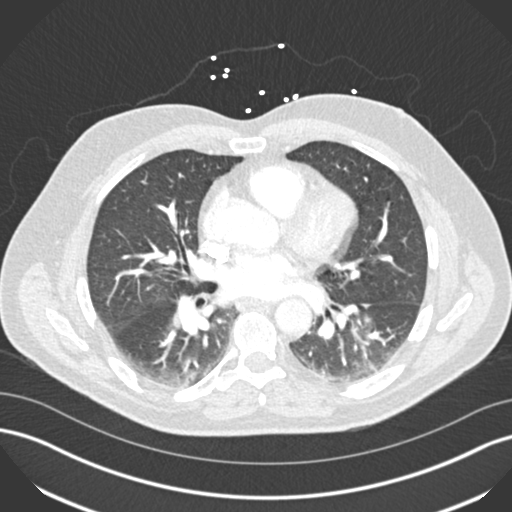
[im 136/261  soft-tissue]
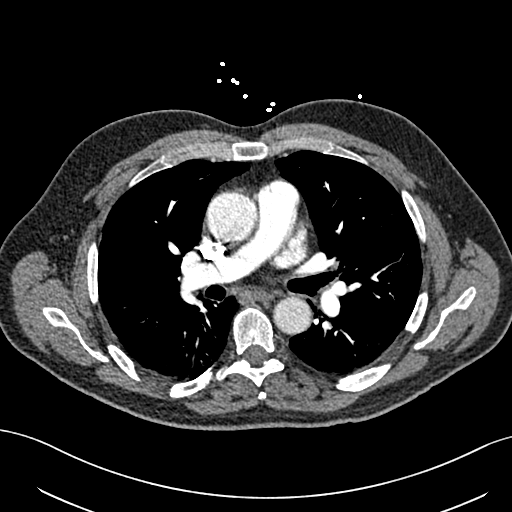
[im 147/261  lung]
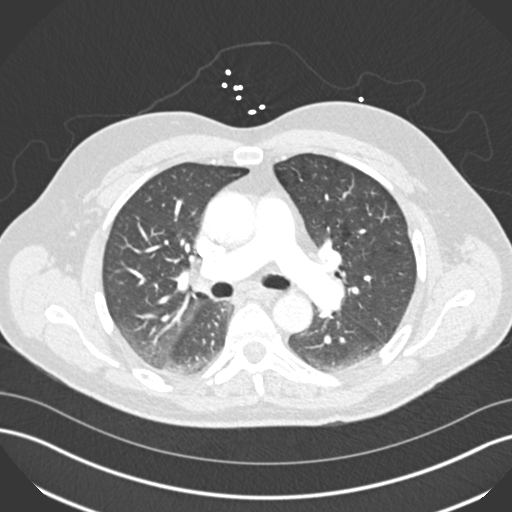
[im 159/261  soft-tissue]
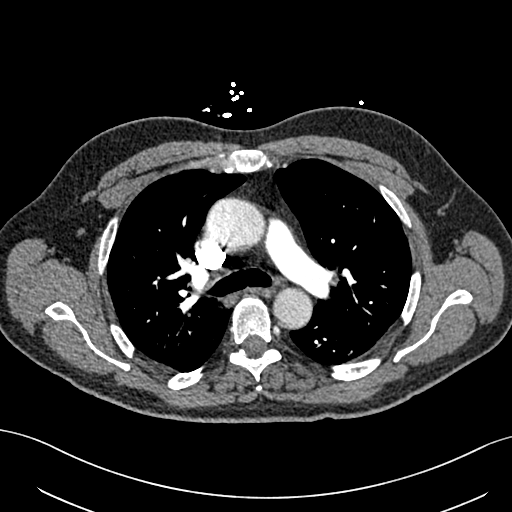
[im 181/261  lung]
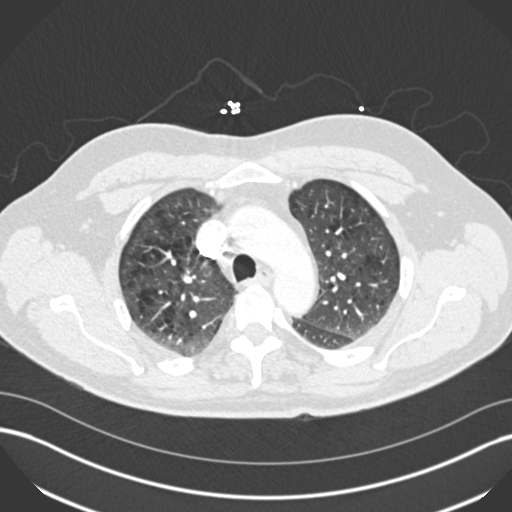
[im 193/261  soft-tissue]
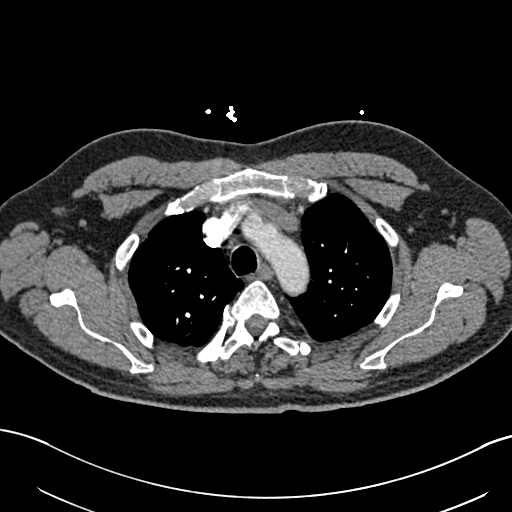
[im 215/261  lung]
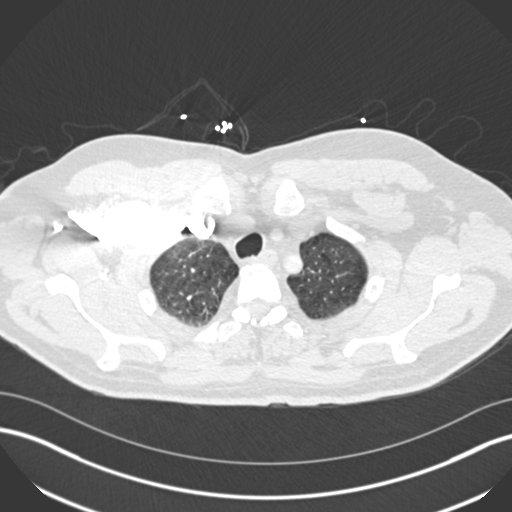
[im 227/261  soft-tissue]
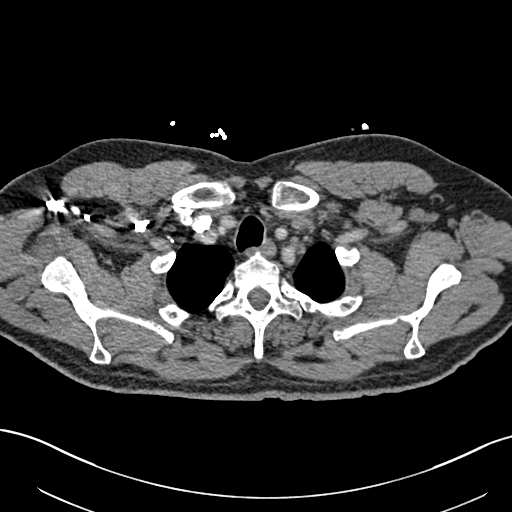
[im 249/261  lung]
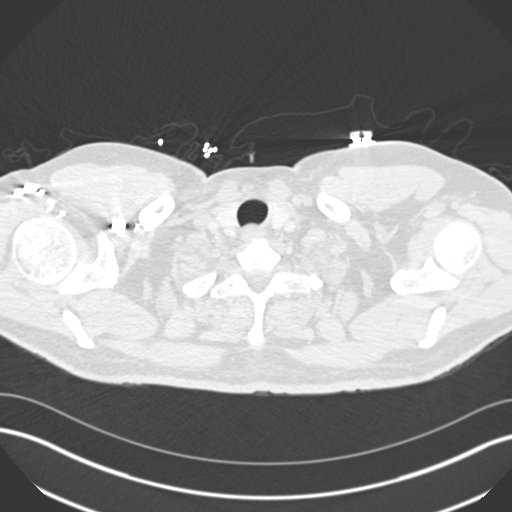

[Series 7: coronal mpr · coronal · 0.54mm/px · 3 of 128 slices shown]
[im 32/128  soft-tissue]
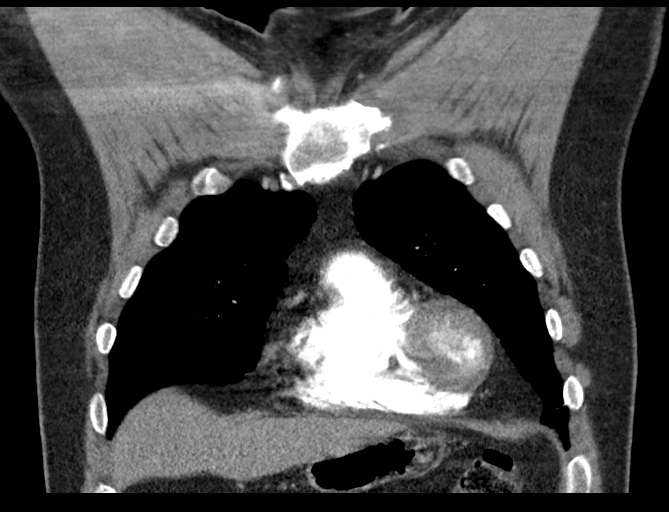
[im 64/128  soft-tissue]
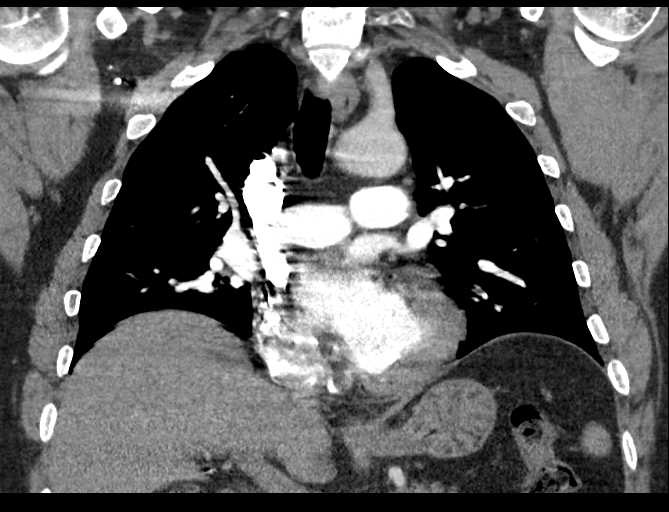
[im 96/128  soft-tissue]
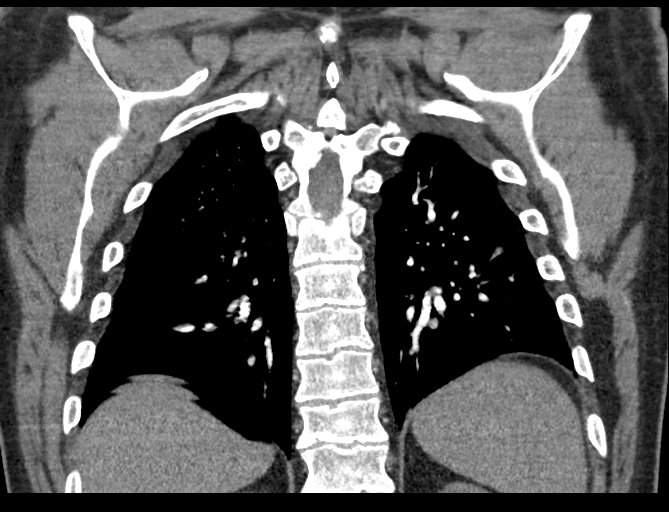

[18 of 46 positions shown; findings below may reference images not displayed]

FINDINGS: Cardiovascular: Satisfactory opacification of the pulmonary arteries
to the subsegmental level. No evidence of pulmonary embolism.

No evidence of thoracic aortic aneurysm or dissection.

Heart is normal in size.  No pericardial effusion.

Mediastinum/Nodes: Small mediastinal lymph nodes which do not meet
pathologic CT size criteria.

Visualized thyroid is unremarkable.

Lungs/Pleura: Mild centrilobular and paraseptal emphysematous
changes, upper lobe predominant.

Mild patchy bilateral lower lobe opacities, likely atelectasis.

No focal consolidation.

No pleural effusion or pneumothorax.

Upper Abdomen: Visualized upper abdomen is notable for
cholecystectomy clips.

Musculoskeletal: Visualized osseous structures are within normal
limits.

Review of the MIP images confirms the above findings.
IMPRESSION: No evidence of pulmonary embolism.

No evidence of acute cardiopulmonary disease.

## 2016-07-21 MED ORDER — SODIUM CHLORIDE 0.9 % IV SOLN
INTRAVENOUS | Status: DC
  Administered 2016-07-21: via INTRAVENOUS

## 2016-07-21 MED ORDER — ASPIRIN 81 MG PO CHEW
324.0000 mg | CHEWABLE_TABLET | Freq: Once | ORAL | Status: AC
  Administered 2016-07-21: 324 mg via ORAL
  Filled 2016-07-21: qty 4

## 2016-07-21 MED ORDER — ACETAMINOPHEN 325 MG PO TABS: 650.0000 mg | ORAL_TABLET | ORAL | Status: DC | PRN

## 2016-07-21 MED ORDER — LISINOPRIL 10 MG PO TABS
20.0000 mg | ORAL_TABLET | Freq: Every day | ORAL | Status: DC
  Administered 2016-07-22: 20 mg via ORAL
  Filled 2016-07-21: qty 2

## 2016-07-21 MED ORDER — HYDROCODONE-ACETAMINOPHEN 5-325 MG PO TABS: 1.0000 | ORAL_TABLET | Freq: Three times a day (TID) | ORAL | Status: DC | PRN

## 2016-07-21 MED ORDER — IOPAMIDOL (ISOVUE-370) INJECTION 76%
100.0000 mL | Freq: Once | INTRAVENOUS | Status: AC | PRN
  Administered 2016-07-21: 100 mL via INTRAVENOUS

## 2016-07-21 MED ORDER — HYDROMORPHONE HCL 1 MG/ML IJ SOLN
1.0000 mg | INTRAMUSCULAR | Status: DC | PRN
Start: 2016-07-21 — End: 2016-07-22
  Administered 2016-07-21 – 2016-07-22 (×3): 1 mg via INTRAVENOUS
  Filled 2016-07-21 (×3): qty 1

## 2016-07-21 MED ORDER — NITROGLYCERIN 0.4 MG SL SUBL
0.4000 mg | SUBLINGUAL_TABLET | SUBLINGUAL | Status: DC | PRN
Start: 2016-07-21 — End: 2016-07-21
  Administered 2016-07-21 (×2): 0.4 mg via SUBLINGUAL
  Filled 2016-07-21: qty 1

## 2016-07-21 MED ORDER — HYDROMORPHONE HCL 1 MG/ML IJ SOLN
0.5000 mg | Freq: Once | INTRAMUSCULAR | Status: AC
  Administered 2016-07-21: 0.5 mg via INTRAVENOUS
  Filled 2016-07-21: qty 0.5

## 2016-07-21 MED ORDER — CYCLOBENZAPRINE HCL 10 MG PO TABS: 10.0000 mg | ORAL_TABLET | Freq: Three times a day (TID) | ORAL | Status: DC | PRN

## 2016-07-21 MED ORDER — HYDROMORPHONE HCL 1 MG/ML IJ SOLN: 1.0000 mg | INTRAMUSCULAR | Status: DC | PRN

## 2016-07-21 MED ORDER — KETOROLAC TROMETHAMINE 30 MG/ML IJ SOLN
15.0000 mg | Freq: Once | INTRAMUSCULAR | Status: AC
  Administered 2016-07-21: 15 mg via INTRAVENOUS
  Filled 2016-07-21: qty 1

## 2016-07-21 MED ORDER — ONDANSETRON HCL 4 MG/2ML IJ SOLN: 4.0000 mg | Freq: Four times a day (QID) | INTRAMUSCULAR | Status: DC | PRN

## 2016-07-21 MED ORDER — FENTANYL CITRATE (PF) 100 MCG/2ML IJ SOLN
50.0000 ug | INTRAMUSCULAR | Status: AC | PRN
Start: 2016-07-21 — End: 2016-07-21
  Administered 2016-07-21 (×2): 50 ug via INTRAVENOUS
  Filled 2016-07-21 (×2): qty 2

## 2016-07-21 NOTE — H&P (Signed)
[facility] History and Physical  Clinton Ross [id number] DOB: [birth year] DOA: 07/21/2016  Referring physician: Dr Wilson Singer PCP: Default, Provider, MD   Chief Complaint: Chest pain  HPI: Clinton Ross is a 50 y.o. male with history of HTN, gallstones, pancreatitis and PTSD who presented with L chest and shoulder pain.  In ED was eval for MI and trop /EKG were negative.  Di-dimer was up and CT angio of chest was neg for PE.  Asked to see pt for admission for CP r/o MI.    Patient says he fell asleep on his arm 2-3 days ago and after that had some pain in the shoulder and numbness in the arm.  Then he fell asleep again on his L arm and was woken up and startled and felt like something "pulled" in the L chest muscle.  Since then he has been having severe L chest and shoulder/ L upper arm pains.  He denies any hist of heart disease, angina/ stents.  Denies any associated N/V, SOB or fevers/ chills/ sweats.    Patient is on disability, has PTSD, was in the [employer].  Lives w his wife in [place].  Grew up in [place].   Patient diagnosed with colon cancer in June 2017, has been getting chemo and radiation, 28 treatments each (taking a pill for chemo ) which were completed last week.  They have f/u appt with [facility] on Nov 1st this week.     ROS  no joint pain   no HA  no blurry vision  no rash  no diarrhea  no nausea/ vomiting  no dysuria  no difficulty voiding  no change in urine color    Past Medical History  Past Medical History:  Diagnosis Date  . Acute pancreatitis 10/2013  . Bulging disc   . Cancer ([place])   . Gallstones   . Hypertension   . PTSD (post-traumatic stress disorder)   . Sciatica    Past Surgical History  Past Surgical History:  Procedure Laterality Date  . CHOLECYSTECTOMY     Family History  Family History  Problem Relation Age of Onset  . Hypertension Mother   . Stroke Father   . Diabetes Other    Social History  reports that he has  been smoking Cigarettes.  He has a 30.00 pack-year smoking history. He has never used smokeless tobacco. He reports that he does not drink alcohol or use drugs. Allergies  Allergies  Allergen Reactions  . Morphine And Related     Altered mental status-burning sensation all over   Home medications Prior to Admission medications   Medication Sig Start Date End Date Taking? Authorizing Provider  cyclobenzaprine (FLEXERIL) 10 MG tablet Take 10 mg by mouth 3 (three) times daily as needed for muscle spasms.   Yes Historical Provider, MD  HYDROcodone-acetaminophen (NORCO/VICODIN) 5-325 MG tablet Take 1 tablet by mouth 3 (three) times daily as needed for moderate pain.   Yes Historical Provider, MD  lisinopril (PRINIVIL,ZESTRIL) 40 MG tablet Take 20 mg by mouth daily.   Yes Historical Provider, MD  amoxicillin (AMOXIL) 500 MG capsule Take 1 capsule (500 mg total) by mouth 3 (three) times daily. Patient not taking: Reported on 07/21/2016 08/06/15   Lily Kocher, PA-C  predniSONE (DELTASONE) 50 MG tablet 1 tab for 7 days, one half tab for 7 days Patient not taking: Reported on 07/21/2016 12/30/15   [name], MD   Liver Function Tests No results for input(s): AST, ALT,  ALKPHOS, BILITOT, PROT, ALBUMIN in the last 168 hours. No results for input(s): LIPASE, AMYLASE in the last 168 hours. CBC  Recent Labs Lab 07/21/16 1319  WBC 7.1  HGB 14.4  HCT 42.4  MCV 90.6  PLT 123456   Basic Metabolic Panel  Recent Labs Lab 07/21/16 1319  NA 137  K 3.5  CL 103  CO2 27  GLUCOSE 82  BUN 11  CREATININE 0.96  CALCIUM 9.1     Vitals:   07/21/16 1500 07/21/16 1605 07/21/16 1630 07/21/16 1700  BP: 110/87 117/83 117/79 123/83  Pulse: 83 77 73 75  Resp: 16 17 15 11   Temp:      TempSrc:      SpO2: 96% 97% 96% 96%  Weight:      Height:       Exam: Gen alert, no distress No rash, cyanosis or gangrene Sclera anicteric, throat clear  No jvd or bruits Chest clear bilat  L upper chest and  shoulder muscles are tender to palpation  No chest tenderness on the right RRR no MRG Abd soft ntnd no mass or ascites +bs GU normal male MS no joint effusions or deformity Ext no LE edema / no wounds or ulcers Neuro is alert, Ox 3 , nf   EKG (independ reviewed) > NSR, no acute changes, 96 bpm CXR (independ reviewed) > no acute disease CT angio chest > some emphysematous changes in the bilat upper lobes, no PE or infiltrates   Assessment: 1. Chest pain - possibly musculoskeletal , r/o cardiac.  Admit tele, serial trop/ EKG, consdier cards input. ECHO in am.  Pain medication. 2. Tobacco/ some emphysema by CT chest 3. HTN on lisinopril  Plan - as above     [facility] [place] [facility] Pager [phone]   If 7PM-7AM, please contact night-coverage [email or web address] Password [facility] 07/21/2016, 5:44 PM

## 2016-07-21 NOTE — ED Provider Notes (Signed)
[facility] DEPT Provider Note   CSN: [id number] Arrival date & time: 07/21/16  1258     History   Chief Complaint Chief Complaint  Patient presents with  . Chest Pain    HPI Clinton Ross is a 50 y.o. male.   Chest Pain     Pt was seen at 1400. Per pt and his wife, c/o gradual onset and persistence of waxing and waning left sided chest "pain" that began last night. Pt states he was laying on the couch and "was startled" by his wife before the pain began. Pt describes the CP as "pressure," that radiates into his left neck and arm. Has been associated with SOB, nausea, and diaphoresis. Pt states he took his usual pain medication hydrocodone and flexeril without change in his symptoms.  Denies abd pain, no back pain, no focal motor weakness, no tingling/numbness in extremities, no palpitations, no cough, no fevers.    Past Medical History:  Diagnosis Date  . Acute pancreatitis 10/2013  . Bulging disc   . Cancer ([place])   . Gallstones   . Hypertension   . PTSD (post-traumatic stress disorder)   . Sciatica     Patient Active Problem List   Diagnosis Date Noted  . Cholelithiasis 11/28/2013  . Tobacco abuse 11/28/2013  . Abdominal pain 11/27/2013  . Acute pancreatitis 11/20/2013  . Constipation 11/20/2013  . HTN (hypertension), benign 11/20/2013  . PTSD (post-traumatic stress disorder) 11/20/2013  . Sciatica 05/24/2009  . DERANGEMENT MENISCUS 10/24/2008  . JOINT EFFUSION, KNEE 10/24/2008  . KNEE PAIN 10/24/2008    Past Surgical History:  Procedure Laterality Date  . CHOLECYSTECTOMY         Home Medications    Prior to Admission medications   Medication Sig Start Date End Date Taking? Authorizing Provider  amoxicillin (AMOXIL) 500 MG capsule Take 1 capsule (500 mg total) by mouth 3 (three) times daily. 08/06/15   Lily Kocher, PA-C  cyclobenzaprine (FLEXERIL) 10 MG tablet Take 10 mg by mouth 3 (three) times daily as needed for muscle spasms.    Historical  Provider, MD  lisinopril (PRINIVIL,ZESTRIL) 40 MG tablet Take 20 mg by mouth daily.    Historical Provider, MD  predniSONE (DELTASONE) 50 MG tablet 1 tab for 7 days, one half tab for 7 days 12/30/15   [name], MD  traMADol (ULTRAM) 50 MG tablet Take by mouth every 6 (six) hours as needed.    Historical Provider, MD    Family History Family History  Problem Relation Age of Onset  . Hypertension Mother   . Stroke Father   . Diabetes Other     Social History Social History  Substance Use Topics  . Smoking status: Current Every Day Smoker    Packs/day: 1.00    Years: 30.00    Types: Cigarettes  . Smokeless tobacco: Never Used  . Alcohol use No     Allergies   Morphine and related   Review of Systems Review of Systems  Cardiovascular: Positive for chest pain.  ROS: Statement: All systems negative except as marked or noted in the HPI; Constitutional: Negative for fever and chills. ; ; Eyes: Negative for eye pain, redness and discharge. ; ; ENMT: Negative for ear pain, hoarseness, nasal congestion, sinus pressure and sore throat. ; ; Cardiovascular: +CP, SOB, diaphoresis. Negative for palpitations, and peripheral edema. ; ; Respiratory: Negative for cough, wheezing and stridor. ; ; Gastrointestinal: +nausea. Negative for vomiting, diarrhea, abdominal pain, blood in stool, hematemesis,  jaundice and rectal bleeding. . ; ; Genitourinary: Negative for dysuria, flank pain and hematuria. ; ; Musculoskeletal: Negative for back pain and neck pain. Negative for swelling and trauma.; ; Skin: Negative for pruritus, rash, abrasions, blisters, bruising and skin lesion.; ; Neuro: Negative for headache, lightheadedness and neck stiffness. Negative for weakness, altered level of consciousness, altered mental status, extremity weakness, paresthesias, involuntary movement, seizure and syncope.      Physical Exam Updated Vital Signs BP 131/92 (BP Location: Right Arm)   Pulse 98   Temp 97.8 F (36.6  C) (Oral)   Resp 20   Ht 5\' 10"  (1.778 m)   Wt 197 lb (89.4 kg)   SpO2 98%   BMI 28.27 kg/m   Physical Exam 1405: Physical examination:  Nursing notes reviewed; Vital signs and O2 SAT reviewed;  Constitutional: Well developed, Well nourished, Well hydrated, In no acute distress; Head:  Normocephalic, atraumatic; Eyes: EOMI, PERRL, No scleral icterus; ENMT: Mouth and pharynx normal, Mucous membranes moist; Neck: Supple, Full range of motion, No lymphadenopathy; Cardiovascular: Regular rate and rhythm, No gallop; Respiratory: Breath sounds clear & equal bilaterally, No wheezes.  Speaking full sentences with ease, Normal respiratory effort/excursion; Chest: +left parasternal and anterior chest wall areas tender to palp. No deformity, no soft tissue crepitus. Movement normal; Abdomen: Soft, Nontender, Nondistended, Normal bowel sounds; Genitourinary: No CVA tenderness; Extremities: Pulses normal, No tenderness, No edema, No calf edema or asymmetry.; Neuro: AA&Ox3, Major CN grossly intact.  Speech clear. No gross focal motor or sensory deficits in extremities.; Skin: Color normal, Warm, Dry.     ED Treatments / Results  Labs (all labs ordered are listed, but only abnormal results are displayed)   EKG  EKG Interpretation  Date/Time:  Sunday July 21 2016 13:06:39 EDT Ventricular Rate:  96 PR Interval:  172 QRS Duration: 76 QT Interval:  332 QTC Calculation: 419 R Axis:   51 Text Interpretation:  Normal sinus rhythm Possible Left atrial enlargement Borderline ECG When compared with ECG of 10/09/2009 No significant change was found Confirmed by [facility]  MD, Nunzio Cory [phone]) on 07/21/2016 2:06:26 PM       Radiology   Procedures Procedures (including critical care time)  Medications Ordered in ED Medications  aspirin chewable tablet 324 mg (not administered)  nitroGLYCERIN (NITROSTAT) SL tablet 0.4 mg (not administered)  fentaNYL (SUBLIMAZE) injection 50 mcg (not administered)      Initial Impression / Assessment and Plan / ED Course  I have reviewed the triage vital signs and the nursing notes.  Pertinent labs & imaging results that were available during my care of the patient were reviewed by me and considered in my medical decision making (see chart for details).  MDM Reviewed: previous chart, nursing note and vitals Reviewed previous: labs and ECG Interpretation: labs, ECG and x-ray   Results for orders placed or performed during the hospital encounter of 0000000  Basic metabolic panel  Result Value Ref Range   Sodium 137 135 - 145 mmol/L   Potassium 3.5 3.5 - 5.1 mmol/L   Chloride 103 101 - 111 mmol/L   CO2 27 22 - 32 mmol/L   Glucose, Bld 82 65 - 99 mg/dL   BUN 11 6 - 20 mg/dL   Creatinine, Ser 0.96 0.61 - 1.24 mg/dL   Calcium 9.1 8.9 - 10.3 mg/dL   GFR calc non Af Amer >60 >60 mL/min   GFR calc Af Amer >60 >60 mL/min   Anion gap 7 5 - 15  CBC  Result Value Ref Range   WBC 7.1 4.0 - 10.5 K/uL   RBC 4.68 4.22 - 5.81 MIL/uL   Hemoglobin 14.4 13.0 - 17.0 g/dL   HCT 42.4 39.0 - 52.0 %   MCV 90.6 78.0 - 100.0 fL   MCH 30.8 26.0 - 34.0 pg   MCHC 34.0 30.0 - 36.0 g/dL   RDW 20.6 (H) 11.5 - 15.5 %   Platelets 154 150 - 400 K/uL  D-dimer, quantitative  Result Value Ref Range   D-Dimer, Quant 1.04 (H) 0.00 - 0.50 ug/mL-FEU  I-stat troponin, ED  Result Value Ref Range   Troponin i, poc 0.00 0.00 - 0.08 ng/mL   Comment 3           Dg Chest 2 View Result Date: 07/21/2016 CLINICAL DATA:  Chest pain. EXAM: CHEST  2 VIEW COMPARISON:  Radiographs of November 27, 2013. FINDINGS: The heart size and mediastinal contours are within normal limits. Both lungs are clear. No pneumothorax or pleural effusion is noted. The visualized skeletal structures are unremarkable. IMPRESSION: No active cardiopulmonary disease. Electronically Signed   By: Marijo Conception, M.D.   On: 07/21/2016 14:22    1600:  Pt feels he is improving after meds. D-dimer elevated; CT-A  chest ordered. Sign out to Dr. Wilson Singer.  Final Clinical Impressions(s) / ED Diagnoses   Final diagnoses:  None    New Prescriptions New Prescriptions   No medications on file      Francine Graven, DO 07/21/16 1600

## 2016-07-21 NOTE — ED Triage Notes (Addendum)
Patient c/o left side chest pain that radiates into left shoulder blade and down left arm. Per wife patient was c/o neck pain yesterday, went to sleep and was startled by wife,jerked and since then pain in chest. Patient reports shortness of breath but denies any dizziness, nausea, or vomiting. Patient states that he just finished chemo for colon cancer.

## 2016-07-22 ENCOUNTER — Observation Stay (HOSPITAL_COMMUNITY): Payer: Non-veteran care

## 2016-07-22 DIAGNOSIS — F17210 Nicotine dependence, cigarettes, uncomplicated: Secondary | ICD-10-CM | POA: Diagnosis not present

## 2016-07-22 DIAGNOSIS — I10 Essential (primary) hypertension: Secondary | ICD-10-CM

## 2016-07-22 DIAGNOSIS — Z720 Tobacco use: Secondary | ICD-10-CM

## 2016-07-22 DIAGNOSIS — R110 Nausea: Secondary | ICD-10-CM | POA: Diagnosis not present

## 2016-07-22 DIAGNOSIS — R0789 Other chest pain: Secondary | ICD-10-CM | POA: Diagnosis not present

## 2016-07-22 DIAGNOSIS — R079 Chest pain, unspecified: Secondary | ICD-10-CM

## 2016-07-22 NOTE — Progress Notes (Signed)
Discharge instructions given on medications,and follow up visits,patient verbalized understanding. Accompanied by staff to an awaiting vehicle.

## 2016-07-22 NOTE — Discharge Summary (Signed)
Physician Discharge Summary  Clinton Ross [id number] DOB: 1966/04/21 DOA: 07/21/2016  PCP: Default, Provider, [facility]  Admit date: 07/21/2016 Discharge date: 07/22/2016  Admitted From: Home Disposition: Home  Recommendations for Outpatient Follow-up:  1. Follow up with PCP in 1-2 weeks 2. Please obtain BMP/CBC in one week  Home Health: NA Equipment/Devices:NA  Discharge Condition: Stable CODE STATUS: Full Code Diet recommendation: Diet Heart Room service appropriate? Yes; Fluid consistency: Thin Diet - low sodium heart healthy  Brief/Interim Summary: Clinton Ross is a 50 y.o. male with history of HTN, gallstones, pancreatitis and PTSD who presented with L chest and shoulder pain.  In ED was eval for MI and trop /EKG were negative.  Di-dimer was up and CT angio of chest was neg for PE.  Asked to see pt for admission for CP r/o MI.    Patient says he fell asleep on his arm 2-3 days ago and after that had some pain in the shoulder and numbness in the arm.  Then he fell asleep again on his L arm and was woken up and startled and felt like something "pulled" in the L chest muscle.  Since then he has been having severe L chest and shoulder/ L upper arm pains.  He denies any hist of heart disease, angina/ stents.  Denies any associated N/V, SOB or fevers/ chills/ sweats.    Patient is on disability, has PTSD, was in the [employer], Fought in the [employer] in [place].  Lives with his wife in [place].  Grew up in [place].   Patient diagnosed with colon cancer in June 2017, has been getting chemo and radiation, 28 treatments each (taking a pill for chemo ) which were completed last week.  They have f/u appt with [facility] on Nov 1st this week.    Discharge Diagnoses:  Active Problems:   HTN (hypertension), benign   Tobacco abuse   Chest pain   Chest pain -Presented with atypical chest pain left-sided and goes to the left side of his neck and back. -One set of cardiac enzymes  negative and EKGs negative for ischemic changes. -Slightly positive d-dimer's, CT angiography did not show acute findings. -His chest pain scenario is consistent with musculoskeletal pain, his pain is reproducible with minimal palpation. -Discharged to follow-up with primary care physician within one week, has hydrocodone and Flexeril at home, continue. -Instructed to use OTC muscle pain creams like BenGay.  Tobacco abuse -Counseled extensively, has findings of emphysema on x-ray.  HTN  -Continue lisinopril  History of colon cancer -Diagnosed in July 2017, received chemotherapy.  Discharge Instructions  Discharge Instructions    Diet - low sodium heart healthy    Complete by:  As directed    Increase activity slowly    Complete by:  As directed        Medication List    STOP taking these medications   amoxicillin 500 MG capsule Commonly known as:  AMOXIL     TAKE these medications   cyclobenzaprine 10 MG tablet Commonly known as:  FLEXERIL Take 10 mg by mouth 3 (three) times daily as needed for muscle spasms.   HYDROcodone-acetaminophen 5-325 MG tablet Commonly known as:  NORCO/VICODIN Take 1 tablet by mouth 3 (three) times daily as needed for moderate pain.   lisinopril 40 MG tablet Commonly known as:  PRINIVIL,ZESTRIL Take 20 mg by mouth daily.   predniSONE 50 MG tablet Commonly known as:  DELTASONE 1 tab for 7 days, one half  tab for 7 days       Allergies  Allergen Reactions  . Morphine And Related     Altered mental status-burning sensation all over    Consultations:  None   Procedures (Echo, Carotid, EGD, Colonoscopy, ERCP)   Radiological studies: Dg Chest 2 View  Result Date: 07/21/2016 CLINICAL DATA:  Chest pain. EXAM: CHEST  2 VIEW COMPARISON:  Radiographs of November 27, 2013. FINDINGS: The heart size and mediastinal contours are within normal limits. Both lungs are clear. No pneumothorax or pleural effusion is noted. The visualized skeletal  structures are unremarkable. IMPRESSION: No active cardiopulmonary disease. Electronically Signed   By: Marijo Conception, M.D.   On: 07/21/2016 14:22   Ct Angio Chest Pe W/cm &/or Wo Cm  Result Date: 07/21/2016 CLINICAL DATA:  Left chest pain EXAM: CT ANGIOGRAPHY CHEST WITH CONTRAST TECHNIQUE: Multidetector CT imaging of the chest was performed using the standard protocol during bolus administration of intravenous contrast. Multiplanar CT image reconstructions and MIPs were obtained to evaluate the vascular anatomy. CONTRAST:  100 mL Isovue 370 IV COMPARISON:  Chest radiographs dated 07/21/2016 FINDINGS: Cardiovascular: Satisfactory opacification of the pulmonary arteries to the subsegmental level. No evidence of pulmonary embolism. No evidence of thoracic aortic aneurysm or dissection. Heart is normal in size.  No pericardial effusion. Mediastinum/Nodes: Small mediastinal lymph nodes which do not meet pathologic CT size criteria. Visualized thyroid is unremarkable. Lungs/Pleura: Mild centrilobular and paraseptal emphysematous changes, upper lobe predominant. Mild patchy bilateral lower lobe opacities, likely atelectasis. No focal consolidation. No pleural effusion or pneumothorax. Upper Abdomen: Visualized upper abdomen is notable for cholecystectomy clips. Musculoskeletal: Visualized osseous structures are within normal limits. Review of the MIP images confirms the above findings. IMPRESSION: No evidence of pulmonary embolism. No evidence of acute cardiopulmonary disease. Electronically Signed   By: Julian Hy M.D.   On: 07/21/2016 16:11     Subjective:  Discharge Exam: Vitals:   07/21/16 1847 07/21/16 1926 07/22/16 0541 07/22/16 0800  BP: 120/94 122/82 109/80 130/70  Pulse: 71 74 77 75  Resp: 14 16 16 16   Temp:  97.6 F (36.4 C) 98.6 F (37 C) 98.4 F (36.9 C)  TempSrc:  Oral Oral Oral  SpO2: 96% 96% 95% 96%  Weight:  88.1 kg (194 lb 3.6 oz)    Height:  5\' 10"  (1.778 m)      General: Pt is alert, awake, not in acute distress Cardiovascular: RRR, S1/S2 +, no rubs, no gallops Respiratory: CTA bilaterally, no wheezing, no rhonchi Abdominal: Soft, NT, ND, bowel sounds + Extremities: no edema, no cyanosis   The results of significant diagnostics from this hospitalization (including imaging, microbiology, ancillary and laboratory) are listed below for reference.    Microbiology: No results found for this or any previous visit (from the past 240 hour(s)).   Labs: BNP (last 3 results) No results for input(s): BNP in the last 8760 hours. Basic Metabolic Panel:  Recent Labs Lab 07/21/16 1319  NA 137  K 3.5  CL 103  CO2 27  GLUCOSE 82  BUN 11  CREATININE 0.96  CALCIUM 9.1   Liver Function Tests: No results for input(s): AST, ALT, ALKPHOS, BILITOT, PROT, ALBUMIN in the last 168 hours. No results for input(s): LIPASE, AMYLASE in the last 168 hours. No results for input(s): AMMONIA in the last 168 hours. CBC:  Recent Labs Lab 07/21/16 1319  WBC 7.1  HGB 14.4  HCT 42.4  MCV 90.6  PLT 154   Cardiac  Enzymes: No results for input(s): CKTOTAL, CKMB, CKMBINDEX, TROPONINI in the last 168 hours. BNP: Invalid input(s): POCBNP CBG: No results for input(s): GLUCAP in the last 168 hours. D-Dimer  Recent Labs  07/21/16 1353  DDIMER 1.04*   Hgb A1c No results for input(s): HGBA1C in the last 72 hours. Lipid Profile No results for input(s): CHOL, HDL, LDLCALC, TRIG, CHOLHDL, LDLDIRECT in the last 72 hours. Thyroid function studies No results for input(s): TSH, T4TOTAL, T3FREE, THYROIDAB in the last 72 hours.  Invalid input(s): FREET3 Anemia work up No results for input(s): VITAMINB12, FOLATE, FERRITIN, TIBC, IRON, RETICCTPCT in the last 72 hours. Urinalysis    Component Value Date/Time   COLORURINE YELLOW 12/04/2013 1635   APPEARANCEUR CLEAR 12/04/2013 1635   LABSPEC 1.010 12/04/2013 1635   PHURINE 6.5 12/04/2013 1635   GLUCOSEU NEGATIVE  12/04/2013 1635   HGBUR TRACE (A) 12/04/2013 1635   BILIRUBINUR NEGATIVE 12/04/2013 1635   KETONESUR NEGATIVE 12/04/2013 1635   PROTEINUR NEGATIVE 12/04/2013 1635   UROBILINOGEN 0.2 12/04/2013 1635   NITRITE NEGATIVE 12/04/2013 1635   LEUKOCYTESUR NEGATIVE 12/04/2013 1635   Sepsis Labs Invalid input(s): PROCALCITONIN,  WBC,  LACTICIDVEN Microbiology No results found for this or any previous visit (from the past 240 hour(s)).   Time coordinating discharge: Over 30 minutes  SIGNED:   Birdie Hopes, MD  [facility] 07/22/2016, 10:35 AM Pager   If 7PM-7AM, please contact night-coverage [email or web address] Password [email or web address]

## 2016-09-08 ENCOUNTER — Encounter (HOSPITAL_COMMUNITY): Payer: Self-pay | Admitting: Emergency Medicine

## 2016-09-08 ENCOUNTER — Emergency Department (HOSPITAL_COMMUNITY)
Admission: EM | Admit: 2016-09-08 | Discharge: 2016-09-08 | Disposition: A | Discharge status: Home / Self Care | Payer: Non-veteran care | Attending: Emergency Medicine | Admitting: Emergency Medicine

## 2016-09-08 DIAGNOSIS — Z7952 Long term (current) use of systemic steroids: Secondary | ICD-10-CM | POA: Insufficient documentation

## 2016-09-08 DIAGNOSIS — Z4801 Encounter for change or removal of surgical wound dressing: Secondary | ICD-10-CM | POA: Insufficient documentation

## 2016-09-08 DIAGNOSIS — I10 Essential (primary) hypertension: Secondary | ICD-10-CM | POA: Diagnosis not present

## 2016-09-08 DIAGNOSIS — Z85038 Personal history of other malignant neoplasm of large intestine: Secondary | ICD-10-CM | POA: Diagnosis not present

## 2016-09-08 DIAGNOSIS — Z79899 Other long term (current) drug therapy: Secondary | ICD-10-CM | POA: Diagnosis not present

## 2016-09-08 DIAGNOSIS — F17210 Nicotine dependence, cigarettes, uncomplicated: Secondary | ICD-10-CM | POA: Diagnosis not present

## 2016-09-08 DIAGNOSIS — Z5189 Encounter for other specified aftercare: Secondary | ICD-10-CM

## 2016-09-08 NOTE — ED Notes (Signed)
Dressing to surgical wound with abd pad and cloth tape

## 2016-09-08 NOTE — ED Triage Notes (Signed)
PT had recent abd surgery.  Incision was itching and pt scratched the incision and a staple came loose.  Surgery was on 08/20/2016

## 2016-09-08 NOTE — ED Notes (Signed)
Dr Ward in to assess 

## 2016-09-08 NOTE — ED Provider Notes (Signed)
TIME SEEN: 2:15 AM  CHIEF COMPLAINT: Wound recheck  HPI: Pt is a 50 y.o. male with history of colon cancer, hypertension who presents to the emergency department for a wound check. Reports that on November 28th he had part of his colon removed at the [facility]. Was seen in the surgeon's office December 13 and had some of his incisional staples removed. States the night while sleeping he began scratching this area and accidentally dislodged one of his staples. No bleeding or purulent drainage from this area. No new wound dehiscence. He does have a small area of wound dehiscence that was previously noted by his surgeon that he they have been walking regularly. He denies any fevers or chills. No significant abdominal pain. He is having good ostomy output. No vomiting. States otherwise he is feeling well.  ROS: See HPI Constitutional: no fever  Eyes: no drainage  ENT: no runny nose   Cardiovascular:  no chest pain  Resp: no SOB  GI: no vomiting GU: no dysuria Integumentary: no rash  Allergy: no hives  Musculoskeletal: no leg swelling  Neurological: no slurred speech ROS otherwise negative  PAST MEDICAL HISTORY/PAST SURGICAL HISTORY:  Past Medical History:  Diagnosis Date  . Acute pancreatitis 10/2013  . Bulging disc   . Cancer [facility])    Colon  . Gallstones   . Hypertension   . PTSD (post-traumatic stress disorder)   . Sciatica     MEDICATIONS:  Prior to Admission medications   Medication Sig Start Date End Date Taking? Authorizing Provider  cyclobenzaprine (FLEXERIL) 10 MG tablet Take 10 mg by mouth 3 (three) times daily as needed for muscle spasms.    Historical Provider, MD  HYDROcodone-acetaminophen (NORCO/VICODIN) 5-325 MG tablet Take 1 tablet by mouth 3 (three) times daily as needed for moderate pain.    Historical Provider, MD  lisinopril (PRINIVIL,ZESTRIL) 40 MG tablet Take 20 mg by mouth daily.    Historical Provider, MD  predniSONE (DELTASONE) 50 MG tablet 1 tab for  7 days, one half tab for 7 days Patient not taking: Reported on 07/21/2016 12/30/15   [name], MD    ALLERGIES:  Allergies  Allergen Reactions  . Morphine And Related     Altered mental status-burning sensation all over    SOCIAL HISTORY:  Social History  Substance Use Topics  . Smoking status: Current Every Day Smoker    Packs/day: 1.00    Years: 30.00    Types: Cigarettes  . Smokeless tobacco: Never Used  . Alcohol use No    FAMILY HISTORY: Family History  Problem Relation Age of Onset  . Hypertension Mother   . Stroke Father   . Diabetes Other     EXAM: BP 104/93 (BP Location: Left Arm)   Pulse 100   Temp 97.9 F (36.6 C) (Oral)   Resp 19   Ht 5\' 10"  (1.778 m)   Wt 188 lb (85.3 kg)   SpO2 98%   BMI 26.98 kg/m  CONSTITUTIONAL: Alert and oriented and responds appropriately to questions. Well-appearing; well-nourished HEAD: Normocephalic EYES: Conjunctivae clear, PERRL, EOMI ENT: normal nose; no rhinorrhea; moist mucous membranes NECK: Supple, no meningismus, no nuchal rigidity, no LAD  CARD: RRR; S1 and S2 appreciated; no murmurs, no clicks, no rubs, no gallops RESP: Normal chest excursion without splinting or tachypnea; breath sounds clear and equal bilaterally; no wheezes, no rhonchi, no rales, no hypoxia or respiratory distress, speaking full sentences ABD/GI: Normal bowel sounds; non-distended; soft, no rebound,  no guarding, no peritoneal signs, no hepatosplenomegaly; vertical healing abdominal incision from the upper abdomen to the suprapubic region with areas of scabbing, dry skin and mild erythema but no purulent drainage, warmth, induration or fluctuance. Multiple staples noted. He does have an approximately 2 cm area where the wound is dehisced that he is packing with gauze. No drainage from this area. Ostomy in the left abdomen with brown stool and healthy-appearing ostomy. No surrounding erythema or warmth. Abdomen minimally tender to palpation which seems  appropriate. BACK:  The back appears normal and is non-tender to palpation, there is no CVA tenderness EXT: Normal ROM in all joints; non-tender to palpation; no edema; normal capillary refill; no cyanosis, no calf tenderness or swelling    SKIN: Normal color for age and race; warm; no rash NEURO: Moves all extremities equally, sensation to light touch intact diffusely, cranial nerves II through XII intact, normal speech PSYCH: The patient's mood and manner are appropriate. Grooming and personal hygiene are appropriate.  MEDICAL DECISION MAKING: Patient here for a wound check. He does have an area where a staple is partially removed. I have removed the staple completely. There is no wound dehiscence at this area. He does have an area of wound dehiscence that his surgeon is aware of and he has been packing. No sign of superimposed infection. This wound seems to be healing well. They have follow-up with their surgeon this week to have the rest of the staples removed. No complaints of significant pain, fevers, vomiting. Having good ostomy output. I feel he is safe to be discharged home. Discussed return precautions and wound care with patient and wife. They verbalize understanding and are comfortable with this plan. We will place a dressing over his incision site before discharge.   At this time, I do not feel there is any life-threatening condition present. I have reviewed and discussed all results (EKG, imaging, lab, urine as appropriate) and exam findings with patient/family. I have reviewed nursing notes and appropriate previous records.  I feel the patient is safe to be discharged home without further emergent workup and can continue workup as an outpatient as needed. Discussed usual and customary return precautions. Patient/family verbalize understanding and are comfortable with this plan.  Outpatient follow-up has been provided. All questions have been answered.     Procedures .Suture  Removal Date/Time: 09/08/2016 3:03 AM Performed by: Nyra Jabs Authorized by: Nyra Jabs   Consent:    Consent obtained:  Verbal   Consent given by:  Patient   Risks discussed:  Bleeding, pain and wound separation   Alternatives discussed:  No treatment Location:    Location:  Trunk   Trunk location:  Abdomen Procedure details:    Wound appearance:  No signs of infection   Number of staples removed:  2 Post-procedure details:    Post-removal:  Dressing applied   Patient tolerance of procedure:  Tolerated well, no immediate complications      Delice Bison Ward, DO 09/08/16 [id number]

## 2016-09-08 NOTE — ED Notes (Signed)
Pt had surgery for rectal cancer with ostomy on 11/28- he had one half of his staples removed at the [facility] on [address] and is supposed to have the remainder removed this coming [place]. He was awakened tonight with itching from the surgical site and scratched and dislodged a staple. He and spouse report a drainage from the staple site and one other.

## 2017-03-17 ENCOUNTER — Encounter (HOSPITAL_COMMUNITY): Payer: Self-pay | Admitting: *Deleted

## 2017-03-17 ENCOUNTER — Emergency Department (HOSPITAL_COMMUNITY)
Admission: EM | Admit: 2017-03-17 | Discharge: 2017-03-17 | Disposition: A | Discharge status: Home / Self Care | Payer: Non-veteran care | Attending: Emergency Medicine | Admitting: Emergency Medicine

## 2017-03-17 DIAGNOSIS — I10 Essential (primary) hypertension: Secondary | ICD-10-CM | POA: Insufficient documentation

## 2017-03-17 DIAGNOSIS — M5442 Lumbago with sciatica, left side: Secondary | ICD-10-CM | POA: Diagnosis not present

## 2017-03-17 DIAGNOSIS — F17210 Nicotine dependence, cigarettes, uncomplicated: Secondary | ICD-10-CM | POA: Insufficient documentation

## 2017-03-17 DIAGNOSIS — Z79899 Other long term (current) drug therapy: Secondary | ICD-10-CM | POA: Insufficient documentation

## 2017-03-17 DIAGNOSIS — M545 Low back pain: Secondary | ICD-10-CM | POA: Diagnosis present

## 2017-03-17 DIAGNOSIS — G8929 Other chronic pain: Secondary | ICD-10-CM

## 2017-03-17 MED ORDER — METHOCARBAMOL 500 MG PO TABS
500.0000 mg | ORAL_TABLET | Freq: Once | ORAL | Status: AC
  Administered 2017-03-17: 500 mg via ORAL
  Filled 2017-03-17: qty 1

## 2017-03-17 MED ORDER — HYDROMORPHONE HCL 1 MG/ML IJ SOLN
1.0000 mg | Freq: Once | INTRAMUSCULAR | Status: AC
  Administered 2017-03-17: 1 mg via INTRAMUSCULAR
  Filled 2017-03-17: qty 1

## 2017-03-17 NOTE — ED Provider Notes (Signed)
[facility] DEPT Provider Note   CSN: [id number] Arrival date & time: 03/17/17  5277     History   Chief Complaint Chief Complaint  Patient presents with  . Back Pain    HPI Clinton Ross is a 51 y.o. male.  HPI   Clinton Ross is a 51 y.o. male who presents to the Emergency Department complaining of worsening of his chronic low back pain.  Patient has hx of "pinched nerve" in the L2 and L3 area with degenerative disc disease.  He states that he helped someone move a sofa 3 days ago which aggravated his back pain.  He has taken hydrocodone x 2 this morning without relief.  He is followed by the [place] [facility] in [place] and receives steroid injections for his back pain.  He describes a sharp, stabbing pain to his lower back that intermittently radiates into his left leg.  He denies weakness or numbness of the LE's, abdominal pain, urine or bowel changes, fever, and chills.    Past Medical History:  Diagnosis Date  . Acute pancreatitis 10/2013  . Bulging disc   . Cancer [facility])    Colon  . Gallstones   . Hypertension   . PTSD (post-traumatic stress disorder)   . Sciatica     Patient Active Problem List   Diagnosis Date Noted  . Chest pain 07/21/2016  . Cholelithiasis 11/28/2013  . Tobacco abuse 11/28/2013  . Abdominal pain 11/27/2013  . Acute pancreatitis 11/20/2013  . Constipation 11/20/2013  . HTN (hypertension), benign 11/20/2013  . PTSD (post-traumatic stress disorder) 11/20/2013  . Sciatica 05/24/2009  . DERANGEMENT MENISCUS 10/24/2008  . JOINT EFFUSION, KNEE 10/24/2008  . KNEE PAIN 10/24/2008    Past Surgical History:  Procedure Laterality Date  . CHOLECYSTECTOMY    . COLON SURGERY    . rectal cancer Right        Home Medications    Prior to Admission medications   Medication Sig Start Date End Date Taking? Authorizing Provider  cyclobenzaprine (FLEXERIL) 10 MG tablet Take 10 mg by mouth 3 (three) times daily as needed for muscle spasms.    [provider]  HYDROcodone-acetaminophen (NORCO/VICODIN) 5-325 MG tablet Take 1 tablet by mouth 3 (three) times daily as needed for moderate pain.    [provider]  lisinopril (PRINIVIL,ZESTRIL) 40 MG tablet Take 20 mg by mouth daily.    [provider]  predniSONE (DELTASONE) 50 MG tablet 1 tab for 7 days, one half tab for 7 days Patient not taking: Reported on 07/21/2016 12/30/15   [name], MD    Family History Family History  Problem Relation Age of Onset  . Hypertension Mother   . Stroke Father   . Diabetes Other     Social History Social History  Substance Use Topics  . Smoking status: Current Every Day Smoker    Packs/day: 1.00    Years: 30.00    Types: Cigarettes  . Smokeless tobacco: Never Used  . Alcohol use No     Allergies   Morphine and related   Review of Systems Review of Systems  Constitutional: Negative for fever.  Respiratory: Negative for shortness of breath.   Gastrointestinal: Negative for abdominal pain, constipation and vomiting.  Genitourinary: Negative for decreased urine volume, difficulty urinating, dysuria, flank pain and hematuria.  Musculoskeletal: Positive for back pain. Negative for joint swelling.  Skin: Negative for rash.  Neurological: Negative for weakness and numbness.  All other systems reviewed and  are negative.    Physical Exam Updated Vital Signs BP (!) 131/94 (BP Location: Right Arm)   Pulse 92   Temp 97.8 F (36.6 C) (Oral)   Resp 18   Ht 5\' 10"  (1.778 m)   Wt 86.7 kg (191 lb 3 oz)   SpO2 99%   BMI 27.43 kg/m   Physical Exam  Constitutional: He is oriented to person, place, and time. He appears well-developed and well-nourished. No distress.  HENT:  Head: Normocephalic and atraumatic.  Mouth/Throat: Oropharynx is clear and moist.  Neck: Normal range of motion. Neck supple.  Cardiovascular: Normal rate, regular rhythm, normal heart sounds and intact distal pulses.   No murmur  heard. Pulmonary/Chest: Effort normal and breath sounds normal. No respiratory distress.  Abdominal: Soft. He exhibits no distension. There is no tenderness.  Pt has a colostomy.    Musculoskeletal: He exhibits tenderness. He exhibits no edema.       Lumbar back: He exhibits tenderness and pain. He exhibits normal range of motion, no swelling, no deformity, no laceration and normal pulse.  ttp of the upper lumbar spine and bilateral paraspinal muscles.  Pt has 5/5 strength against resistance of bilateral lower extremities.     Neurological: He is alert and oriented to person, place, and time. He has normal strength. No sensory deficit. He exhibits normal muscle tone. Coordination and gait normal.  Reflex Scores:      Patellar reflexes are 2+ on the right side and 2+ on the left side.      Achilles reflexes are 2+ on the right side and 2+ on the left side. Skin: Skin is warm and dry. Capillary refill takes less than 2 seconds. No rash noted.  Psychiatric: He has a normal mood and affect.  Nursing note and vitals reviewed.    ED Treatments / Results  Labs (all labs ordered are listed, but only abnormal results are displayed) Labs Reviewed - No data to display  EKG  EKG Interpretation None       Radiology No results found.  Procedures Procedures (including critical care time)  Medications Ordered in ED Medications - No data to display   Initial Impression / Assessment and Plan / ED Course  I have reviewed the triage vital signs and the nursing notes.  Pertinent labs & imaging results that were available during my care of the patient were reviewed by me and considered in my medical decision making (see chart for details).     Patient is well appearing. Nontoxic. Ambulates with a slow but steady gait. No focal neuro deficits on exam. Symptoms are likely related to acute exacerbation of his chronic pain. He appears to table for discharge, agrees to follow-up with his PCP at  the [facility]   Final Clinical Impressions(s) / ED Diagnoses   Final diagnoses:  Chronic midline low back pain with left-sided sciatica    New Prescriptions New Prescriptions   No medications on file     [name] 03/17/17 1207    Milton Ferguson, MD 03/18/17 1551

## 2017-03-17 NOTE — Discharge Instructions (Signed)
Apply ice packs on/off to your back.  Follow-up with your [facility] doctor.  Return here for any worsening symptoms

## 2017-03-17 NOTE — ED Triage Notes (Signed)
Pt reports lower back pain (9/10) x three days exacerbated by moving couch.  Pt. Received prednisone shot last Wednesday with relief.  Pt says he took a hydrocodone 5-325 at 2 am then another at 4 am last night.  Pt says he has pinched nerve at L2 and L3 with arthritis surrounding vertebrae, also says dx with degenerative disc disease 3 years ago.

## 2017-06-11 ENCOUNTER — Emergency Department (HOSPITAL_COMMUNITY)
Admission: EM | Admit: 2017-06-11 | Discharge: 2017-06-12 | Disposition: A | Discharge status: Home / Self Care | Payer: Non-veteran care | Attending: Emergency Medicine | Admitting: Emergency Medicine

## 2017-06-11 ENCOUNTER — Encounter (HOSPITAL_COMMUNITY): Payer: Self-pay | Admitting: *Deleted

## 2017-06-11 DIAGNOSIS — Z859 Personal history of malignant neoplasm, unspecified: Secondary | ICD-10-CM | POA: Insufficient documentation

## 2017-06-11 DIAGNOSIS — I10 Essential (primary) hypertension: Secondary | ICD-10-CM | POA: Insufficient documentation

## 2017-06-11 DIAGNOSIS — F17210 Nicotine dependence, cigarettes, uncomplicated: Secondary | ICD-10-CM | POA: Diagnosis not present

## 2017-06-11 DIAGNOSIS — R55 Syncope and collapse: Secondary | ICD-10-CM | POA: Diagnosis not present

## 2017-06-11 DIAGNOSIS — Z79899 Other long term (current) drug therapy: Secondary | ICD-10-CM | POA: Insufficient documentation

## 2017-06-11 DIAGNOSIS — R0602 Shortness of breath: Secondary | ICD-10-CM | POA: Diagnosis present

## 2017-06-11 HISTORY — DX: Other chronic pain: G89.29

## 2017-06-11 HISTORY — DX: Pain in unspecified knee: M25.569

## 2017-06-11 HISTORY — DX: Unspecified osteoarthritis, unspecified site: M19.90

## 2017-06-11 HISTORY — DX: Colostomy status: Z93.3

## 2017-06-11 LAB — BASIC METABOLIC PANEL WITH GFR
Anion gap: 9 (ref 5–15)
BUN: 18 mg/dL (ref 6–20)
CO2: 22 mmol/L (ref 22–32)
Calcium: 9 mg/dL (ref 8.9–10.3)
Chloride: 100 mmol/L — ABNORMAL LOW (ref 101–111)
Creatinine, Ser: 1 mg/dL (ref 0.61–1.24)
GFR calc Af Amer: >60 mL/min
GFR calc non Af Amer: >60 mL/min
Glucose, Bld: 207 mg/dL — ABNORMAL HIGH (ref 65–99)
Potassium: 3.8 mmol/L (ref 3.5–5.1)
Sodium: 131 mmol/L — ABNORMAL LOW (ref 135–145)

## 2017-06-11 LAB — CBC WITH DIFFERENTIAL/PLATELET
Basophils Absolute: 0 10*3/uL (ref 0.0–0.1)
Basophils Relative: 0 %
EOS ABS: 0 10*3/uL (ref 0.0–0.7)
EOS PCT: 0 %
HEMATOCRIT: 44.8 % (ref 39.0–52.0)
Hemoglobin: 15.1 g/dL (ref 13.0–17.0)
Lymphocytes Relative: 3 %
Lymphs Abs: 0.5 10*3/uL — ABNORMAL LOW (ref 0.7–4.0)
MCH: 27.8 pg (ref 26.0–34.0)
MCHC: 33.7 g/dL (ref 30.0–36.0)
MCV: 82.4 fL (ref 78.0–100.0)
MONO ABS: 0.2 10*3/uL (ref 0.1–1.0)
MONOS PCT: 1 %
Neutro Abs: 14.9 10*3/uL — ABNORMAL HIGH (ref 1.7–7.7)
Neutrophils Relative %: 96 %
PLATELETS: 244 10*3/uL (ref 150–400)
RBC: 5.44 MIL/uL (ref 4.22–5.81)
RDW: 13.6 % (ref 11.5–15.5)
WBC: 15.5 10*3/uL — ABNORMAL HIGH (ref 4.0–10.5)

## 2017-06-11 LAB — TROPONIN I: Troponin I: <0.03 ng/mL

## 2017-06-11 LAB — D-DIMER, QUANTITATIVE (NOT AT ARMC): D DIMER QUANT: 0.66 ug{FEU}/mL — AB (ref 0.00–0.50)

## 2017-06-11 NOTE — ED Provider Notes (Signed)
[facility] DEPT Provider Note   CSN: [id number] Arrival date & time: 06/11/17  2219     History   Chief Complaint Chief Complaint  Patient presents with  . Allergic Reaction    HPI Clinton Ross is a 51 y.o. male.  The history is provided by the patient. No language interpreter was used.  Allergic Reaction  Presenting symptoms: difficulty breathing   Severity:  Moderate Prior allergic episodes:  No prior episodes Context: medications   Relieved by:  Nothing Worsened by:  Nothing Ineffective treatments:  None tried Pt had both knees injected with lidocaine and cortisone today.  Pt reports 2 episodes of nausea sweating and shortness tonight.   Past Medical History:  Diagnosis Date  . Acute pancreatitis 10/2013  . Arthritis   . Bulging disc   . Cancer [facility])    Colon  . Chronic knee pain   . Colostomy in place [facility])   . Gallstones   . Hypertension   . PTSD (post-traumatic stress disorder)   . Sciatica     Patient Active Problem List   Diagnosis Date Noted  . Chest pain 07/21/2016  . Cholelithiasis 11/28/2013  . Tobacco abuse 11/28/2013  . Abdominal pain 11/27/2013  . Acute pancreatitis 11/20/2013  . Constipation 11/20/2013  . HTN (hypertension), benign 11/20/2013  . PTSD (post-traumatic stress disorder) 11/20/2013  . Sciatica 05/24/2009  . DERANGEMENT MENISCUS 10/24/2008  . JOINT EFFUSION, KNEE 10/24/2008  . KNEE PAIN 10/24/2008    Past Surgical History:  Procedure Laterality Date  . CHOLECYSTECTOMY    . COLON SURGERY    . rectal cancer Right        Home Medications    Prior to Admission medications   Medication Sig Start Date End Date Taking? Authorizing Provider  cyclobenzaprine (FLEXERIL) 10 MG tablet Take 10 mg by mouth 3 (three) times daily as needed for muscle spasms.    [provider]  HYDROcodone-acetaminophen (NORCO/VICODIN) 5-325 MG tablet Take 1 tablet by mouth 3 (three) times daily as needed for moderate pain.    [provider]  lisinopril (PRINIVIL,ZESTRIL) 40 MG tablet Take 20 mg by mouth daily.    [provider]    Family History Family History  Problem Relation Age of Onset  . Hypertension Mother   . Stroke Father   . Diabetes Other     Social History Social History  Substance Use Topics  . Smoking status: Current Every Day Smoker    Packs/day: 1.00    Years: 30.00    Types: Cigarettes  . Smokeless tobacco: Never Used  . Alcohol use No     Allergies   Morphine and related   Review of Systems Review of Systems  All other systems reviewed and are negative.    Physical Exam Updated Vital Signs BP (!) 137/100   Pulse (!) 106   Temp 98.1 F (36.7 C) (Oral)   Resp 20   Ht 5\' 10"  (1.778 m)   Wt 86.6 kg (191 lb)   SpO2 100%   BMI 27.41 kg/m   Physical Exam  Constitutional: He appears well-developed and well-nourished.  HENT:  Head: Normocephalic.  Eyes: Pupils are equal, round, and reactive to light. Conjunctivae and EOM are normal.  Neck: Normal range of motion.  Cardiovascular: Regular rhythm.   Pulmonary/Chest: Effort normal.  Abdominal: Soft.  Musculoskeletal: He exhibits tenderness.  Knee, no erythema no swelling,  Pain with range of motion left knee.  nv and ns intact  Neurological: He is alert.  Skin: Skin is warm.  Psychiatric: He has a normal mood and affect.  Nursing note and vitals reviewed.    ED Treatments / Results  Labs (all labs ordered are listed, but only abnormal results are displayed) Labs Reviewed  CBC WITH DIFFERENTIAL/PLATELET - Abnormal; Notable for the following:       Result Value   WBC 15.5 (*)    Neutro Abs 14.9 (*)    Lymphs Abs 0.5 (*)    All other components within normal limits  BASIC METABOLIC PANEL  TROPONIN I  D-DIMER, QUANTITATIVE (NOT AT [facility]    EKG  EKG Interpretation  Date/Time:  Wednesday June 11 2017 22:43:06 EDT Ventricular Rate:  102 PR Interval:    QRS Duration: 91 QT  Interval:  336 QTC Calculation: 438 R Axis:   62 Text Interpretation:  Sinus tachycardia Left atrial enlargement Baseline wander in lead(s) V2 Rate faster Confirmed by Ezequiel Essex [phone]) on 06/11/2017 11:00:41 PM       Radiology No results found.  Procedures Procedures (including critical care time)  Medications Ordered in ED Medications - No data to display   Initial Impression / Assessment and Plan / ED Course  I have reviewed the triage vital signs and the nursing notes.  Pertinent labs & imaging results that were available during my care of the patient were reviewed by me and considered in my medical decision making (see chart for details).     Pt's care turned over to Dr. Wyvonnia Dusky.  Troponin pending.   Final Clinical Impressions(s) / ED Diagnoses   Final diagnoses:  None    New Prescriptions New Prescriptions   No medications on file     Sidney Ace 06/12/17 0052    Ezequiel Essex, MD 06/12/17 [phone]

## 2017-06-11 NOTE — ED Triage Notes (Signed)
Pt states that [facility], since coming home pt continues to have left knee pain and has had two episodes of sob with feeling like his heart is racing and diaphoresis since coming home and then all of the symptoms except the knee pain will resolve,  Upon arrival to er, pt alert, skin warm and dry, denies any sob or palpitations.

## 2017-06-12 ENCOUNTER — Emergency Department (HOSPITAL_COMMUNITY): Payer: Non-veteran care

## 2017-06-12 LAB — TROPONIN I: Troponin I: <0.03 ng/mL (ref <0.03)

## 2017-06-12 IMAGING — CT CT ANGIO CHEST
3 of 13 series · 18 of 46 positions shown · IV contrast (Isovue)
Comparison: [DATE]

ADDENDUM:
Patient was reinjected with additional 75 mL of Isovue 370 and
repeat CTA images of the chest obtained.

Adequate opacification of the pulmonary arterial system. No filling
defects are visualized within the central or segmental pulmonary
vessels to suggest an acute embolus.
Negative for acute pulmonary embolus or aortic dissection.
CLINICAL DATA: Shortness of breath, tachycardia
EXAM:
CT ANGIOGRAPHY CHEST WITH CONTRAST
TECHNIQUE: Multidetector CT imaging of the chest was performed using the
standard protocol during bolus administration of intravenous
contrast. Multiplanar CT image reconstructions and MIPs were
obtained to evaluate the vascular anatomy.
CONTRAST:  100 mL Isovue 370 intravenous

[Series 5: thins · axial · 0.72mm/px · z∈[-64,+134]mm · 10 of 244 slices shown (1 of 2)]
[im 23/244  lung]
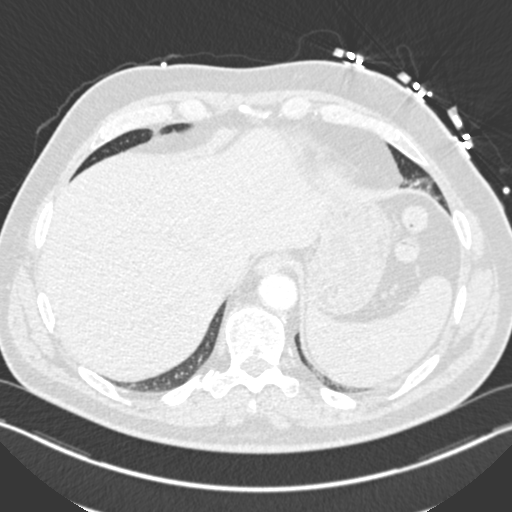
[im 45/244  soft-tissue]
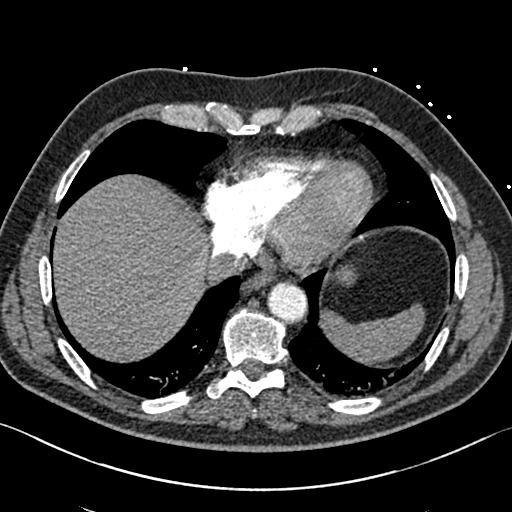
[im 67/244  lung]
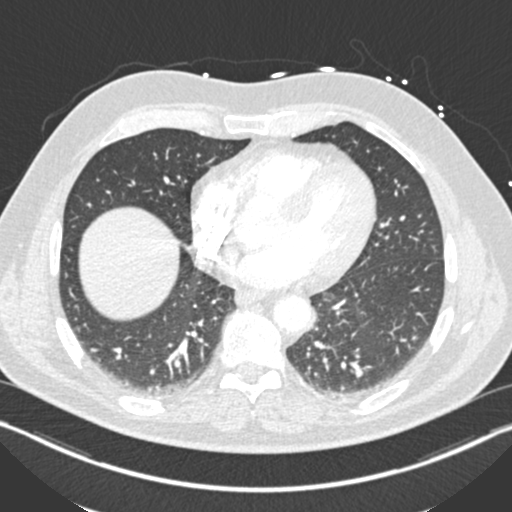
[im 89/244  soft-tissue]
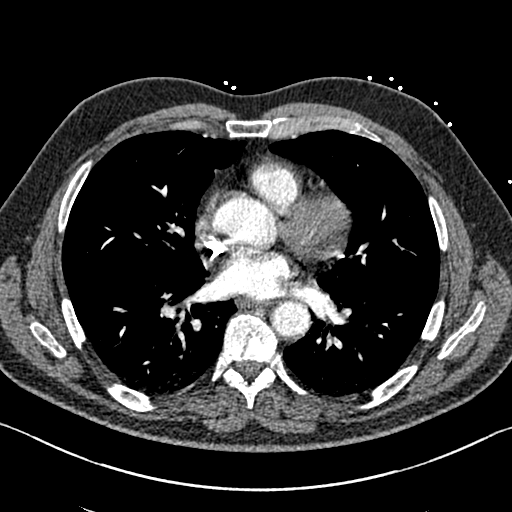
[im 111/244  lung]
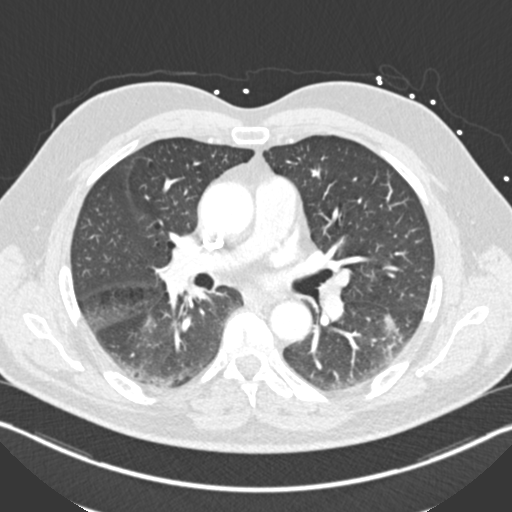
[im 133/244  soft-tissue]
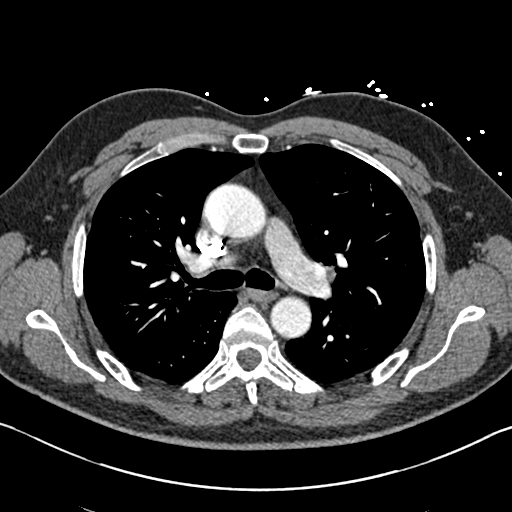
[im 155/244  lung]
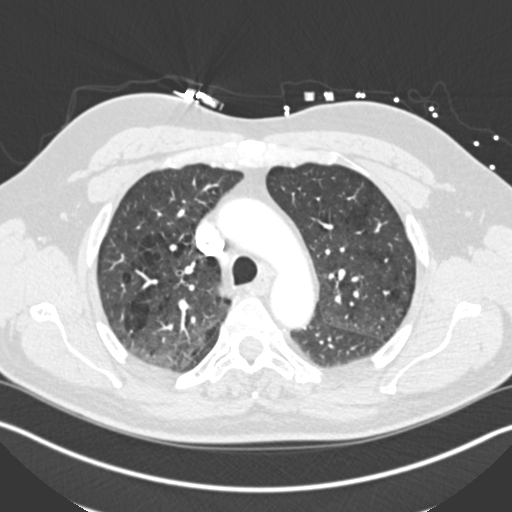
[im 177/244  soft-tissue]
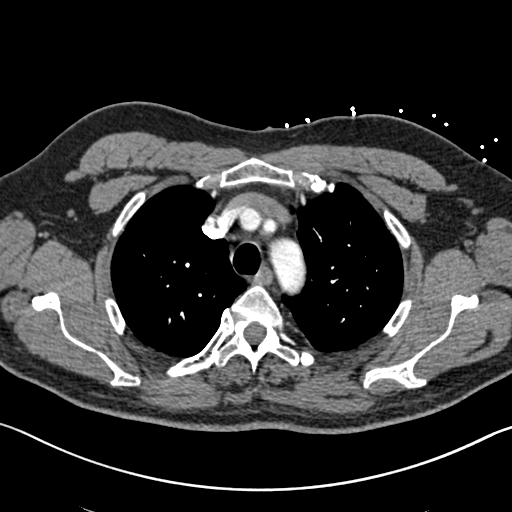
[im 199/244  lung]
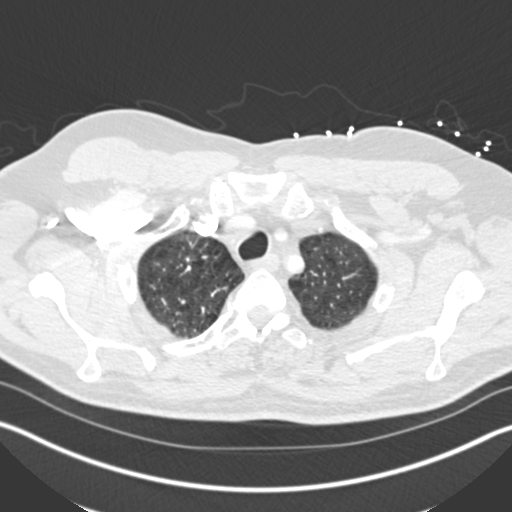
[im 221/244  soft-tissue]
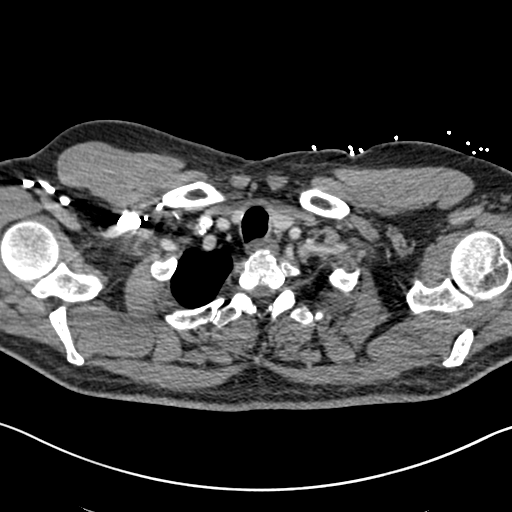

[Series 7: coronal mpr · coronal · 0.48mm/px · 1 of 151 slices shown]
[im 76/151  soft-tissue]
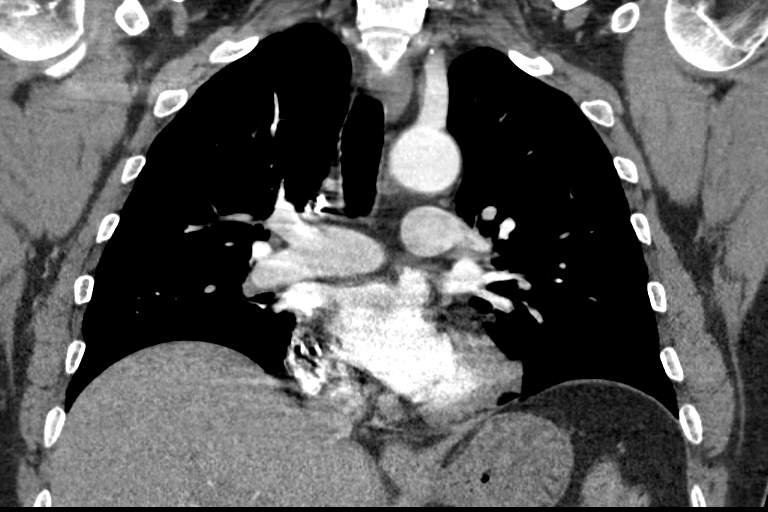

[Series 16: thins · axial · 0.68mm/px · z∈[-260,-85]mm · 7 of 241 slices shown (2 of 2)]
[im 22/241  lung]
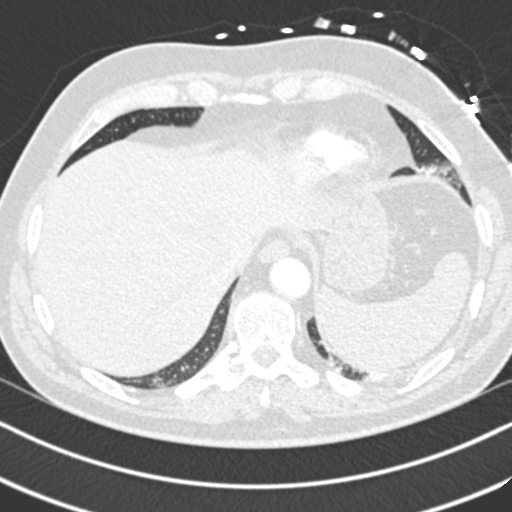
[im 44/241  lung]
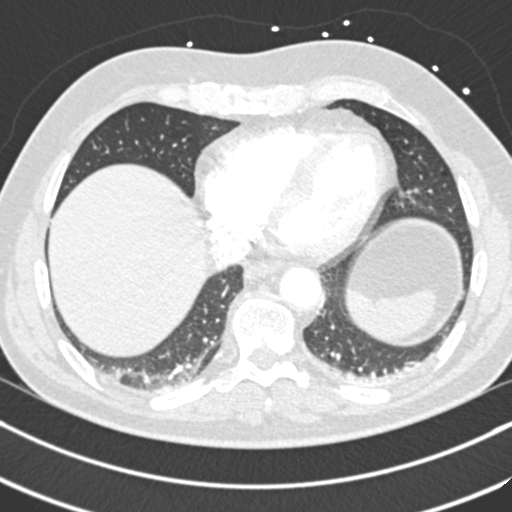
[im 88/241  lung]
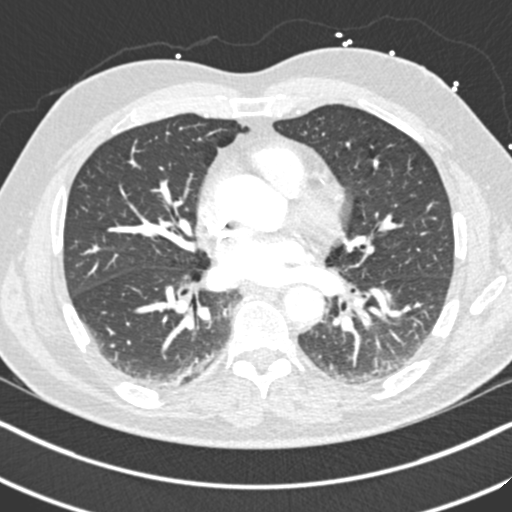
[im 110/241  lung]
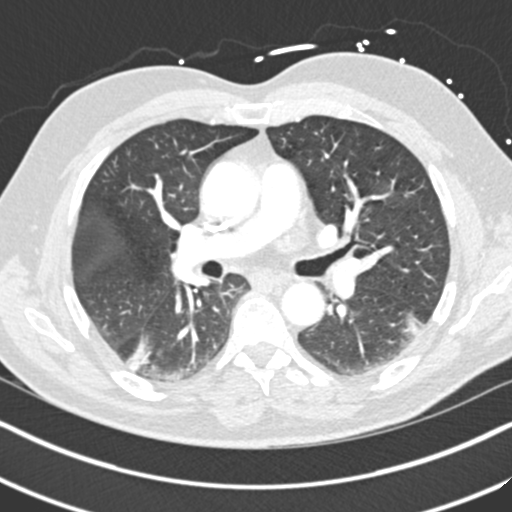
[im 131/241  lung]
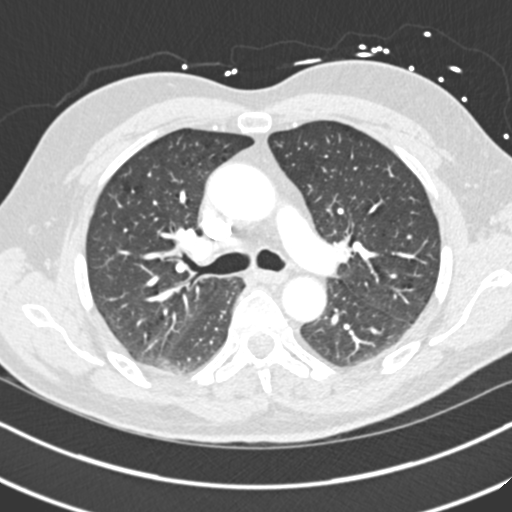
[im 153/241  lung]
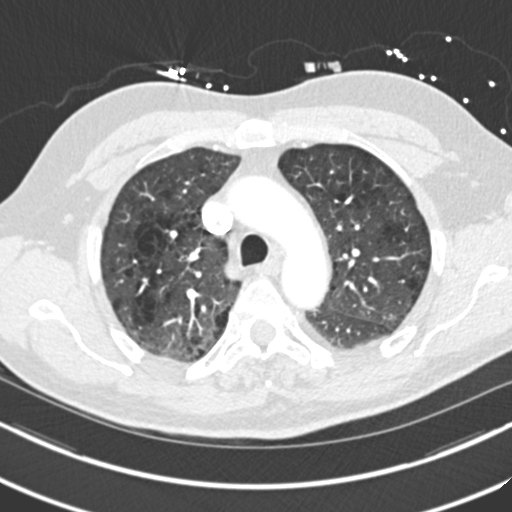
[im 197/241  lung]
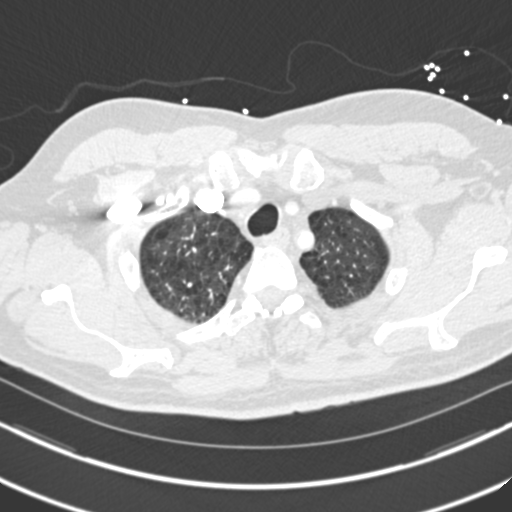

[18 of 46 positions shown; findings below may reference images not displayed]

FINDINGS: Cardiovascular: Poor opacification of the pulmonary arterial system
limits evaluation for emboli. No aneurysmal dilatation of the aorta.
No dissection is seen. Normal heart size. No pericardial effusion

Mediastinum/Nodes: No enlarged mediastinal, hilar, or axillary lymph
nodes. Thyroid gland, trachea, and esophagus demonstrate no
significant findings.

Lungs/Pleura: Moderate emphysema. No acute consolidation, pleural
effusion, or pneumothorax.

Upper Abdomen: No acute abnormality.

Musculoskeletal: No chest wall abnormality. No acute or significant
osseous findings.

Review of the MIP images confirms the above findings.
IMPRESSION: 1. Essentially nondiagnostic for PE due to poor opacification of the
pulmonary arterial system.
2. Moderate emphysema.  No acute infiltrates.

Emphysema ([SF]-[SF]).

## 2017-06-12 MED ORDER — SODIUM CHLORIDE 0.9 % IV BOLUS (SEPSIS)
2000.0000 mL | Freq: Once | INTRAVENOUS | Status: AC
  Administered 2017-06-12: 2000 mL via INTRAVENOUS

## 2017-06-12 MED ORDER — IOPAMIDOL (ISOVUE-370) INJECTION 76%
75.0000 mL | Freq: Once | INTRAVENOUS | Status: AC | PRN
  Administered 2017-06-12: 75 mL via INTRAVENOUS

## 2017-06-12 MED ORDER — IOPAMIDOL (ISOVUE-370) INJECTION 76%
100.0000 mL | Freq: Once | INTRAVENOUS | Status: AC | PRN
  Administered 2017-06-12: 100 mL via INTRAVENOUS

## 2017-06-12 NOTE — Discharge Instructions (Signed)
Keep yourself hydrated.  Follow-up with your doctor.  Return to the ED if you develop new or worsening symptoms. °

## 2018-02-23 ENCOUNTER — Emergency Department (HOSPITAL_COMMUNITY): Payer: Non-veteran care

## 2018-02-23 ENCOUNTER — Encounter (HOSPITAL_COMMUNITY): Payer: Self-pay | Admitting: Emergency Medicine

## 2018-02-23 ENCOUNTER — Emergency Department (HOSPITAL_COMMUNITY)
Admission: EM | Admit: 2018-02-23 | Discharge: 2018-02-23 | Disposition: A | Discharge status: Home / Self Care | Payer: Non-veteran care | Attending: Emergency Medicine | Admitting: Emergency Medicine

## 2018-02-23 ENCOUNTER — Other Ambulatory Visit: Payer: Self-pay

## 2018-02-23 DIAGNOSIS — I10 Essential (primary) hypertension: Secondary | ICD-10-CM | POA: Diagnosis not present

## 2018-02-23 DIAGNOSIS — Z91010 Allergy to peanuts: Secondary | ICD-10-CM | POA: Diagnosis not present

## 2018-02-23 DIAGNOSIS — Z79899 Other long term (current) drug therapy: Secondary | ICD-10-CM | POA: Diagnosis not present

## 2018-02-23 DIAGNOSIS — R61 Generalized hyperhidrosis: Secondary | ICD-10-CM | POA: Insufficient documentation

## 2018-02-23 DIAGNOSIS — Z85038 Personal history of other malignant neoplasm of large intestine: Secondary | ICD-10-CM | POA: Insufficient documentation

## 2018-02-23 DIAGNOSIS — R079 Chest pain, unspecified: Secondary | ICD-10-CM | POA: Diagnosis present

## 2018-02-23 DIAGNOSIS — Z7982 Long term (current) use of aspirin: Secondary | ICD-10-CM | POA: Insufficient documentation

## 2018-02-23 DIAGNOSIS — F17210 Nicotine dependence, cigarettes, uncomplicated: Secondary | ICD-10-CM | POA: Insufficient documentation

## 2018-02-23 DIAGNOSIS — R072 Precordial pain: Secondary | ICD-10-CM | POA: Insufficient documentation

## 2018-02-23 LAB — BASIC METABOLIC PANEL
ANION GAP: 8 (ref 5–15)
BUN: 11 mg/dL (ref 6–20)
CHLORIDE: 104 mmol/L (ref 101–111)
CO2: 25 mmol/L (ref 22–32)
Calcium: 9.3 mg/dL (ref 8.9–10.3)
Creatinine, Ser: 0.97 mg/dL (ref 0.61–1.24)
GFR calc Af Amer: >60 mL/min (ref >60)
GLUCOSE: 171 mg/dL — AB (ref 65–99)
Potassium: 3.6 mmol/L (ref 3.5–5.1)
Sodium: 137 mmol/L (ref 135–145)

## 2018-02-23 LAB — CBC
HEMATOCRIT: 47.6 % (ref 39.0–52.0)
HEMOGLOBIN: 15.8 g/dL (ref 13.0–17.0)
MCH: 27.8 pg (ref 26.0–34.0)
MCHC: 33.2 g/dL (ref 30.0–36.0)
MCV: 83.7 fL (ref 78.0–100.0)
Platelets: 206 10*3/uL (ref 150–400)
RBC: 5.69 MIL/uL (ref 4.22–5.81)
RDW: 13.9 % (ref 11.5–15.5)
WBC: 7.5 10*3/uL (ref 4.0–10.5)

## 2018-02-23 LAB — TROPONIN I
Troponin I: <0.03 ng/mL (ref <0.03)
Troponin I: <0.03 ng/mL (ref <0.03)

## 2018-02-23 IMAGING — DX DG CHEST 2V
2 series · 2 of 2 positions shown · non-contrast
Comparison: CT scan chest [DATE] and chest x-ray [DATE]

CLINICAL DATA: Chest pain and chest heaviness began this morning
associated with diaphoresis. Episode of nausea with metallic taste
in the mouth last night. History of colonic malignancy.

EXAM:
CHEST - 2 VIEW

[chest pa]
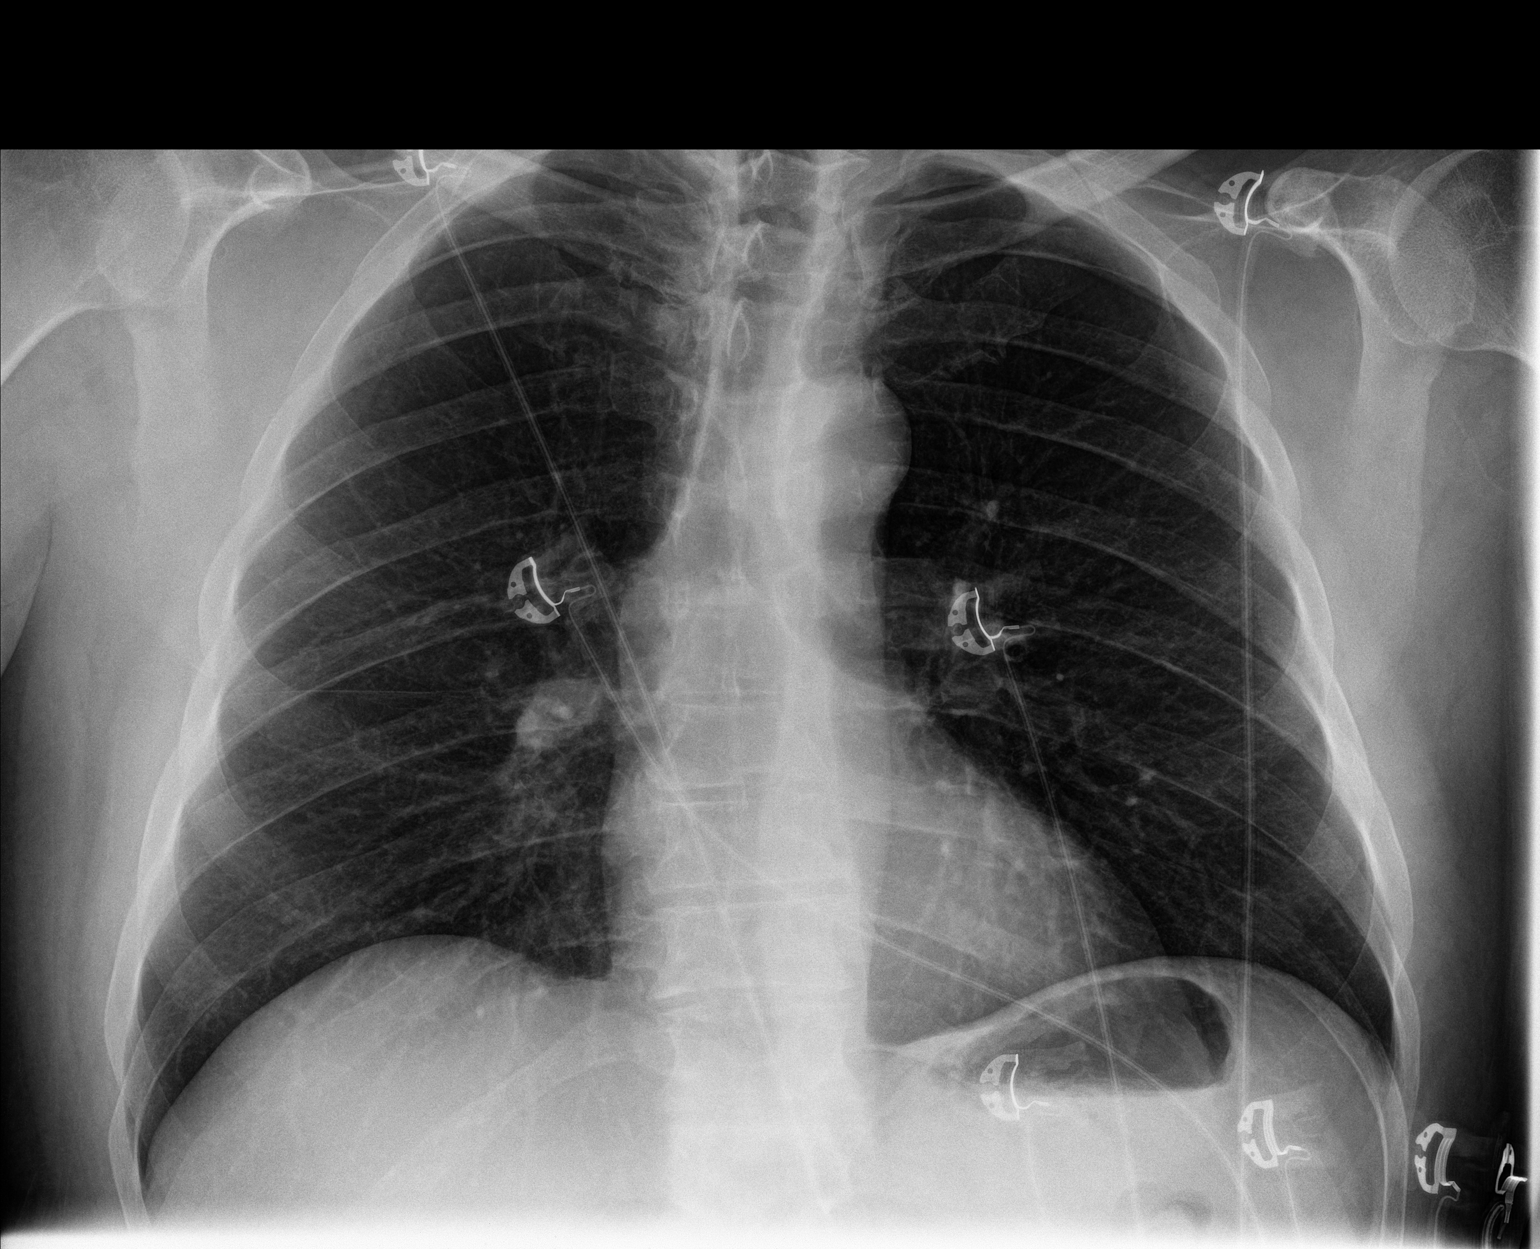

[chest lat]
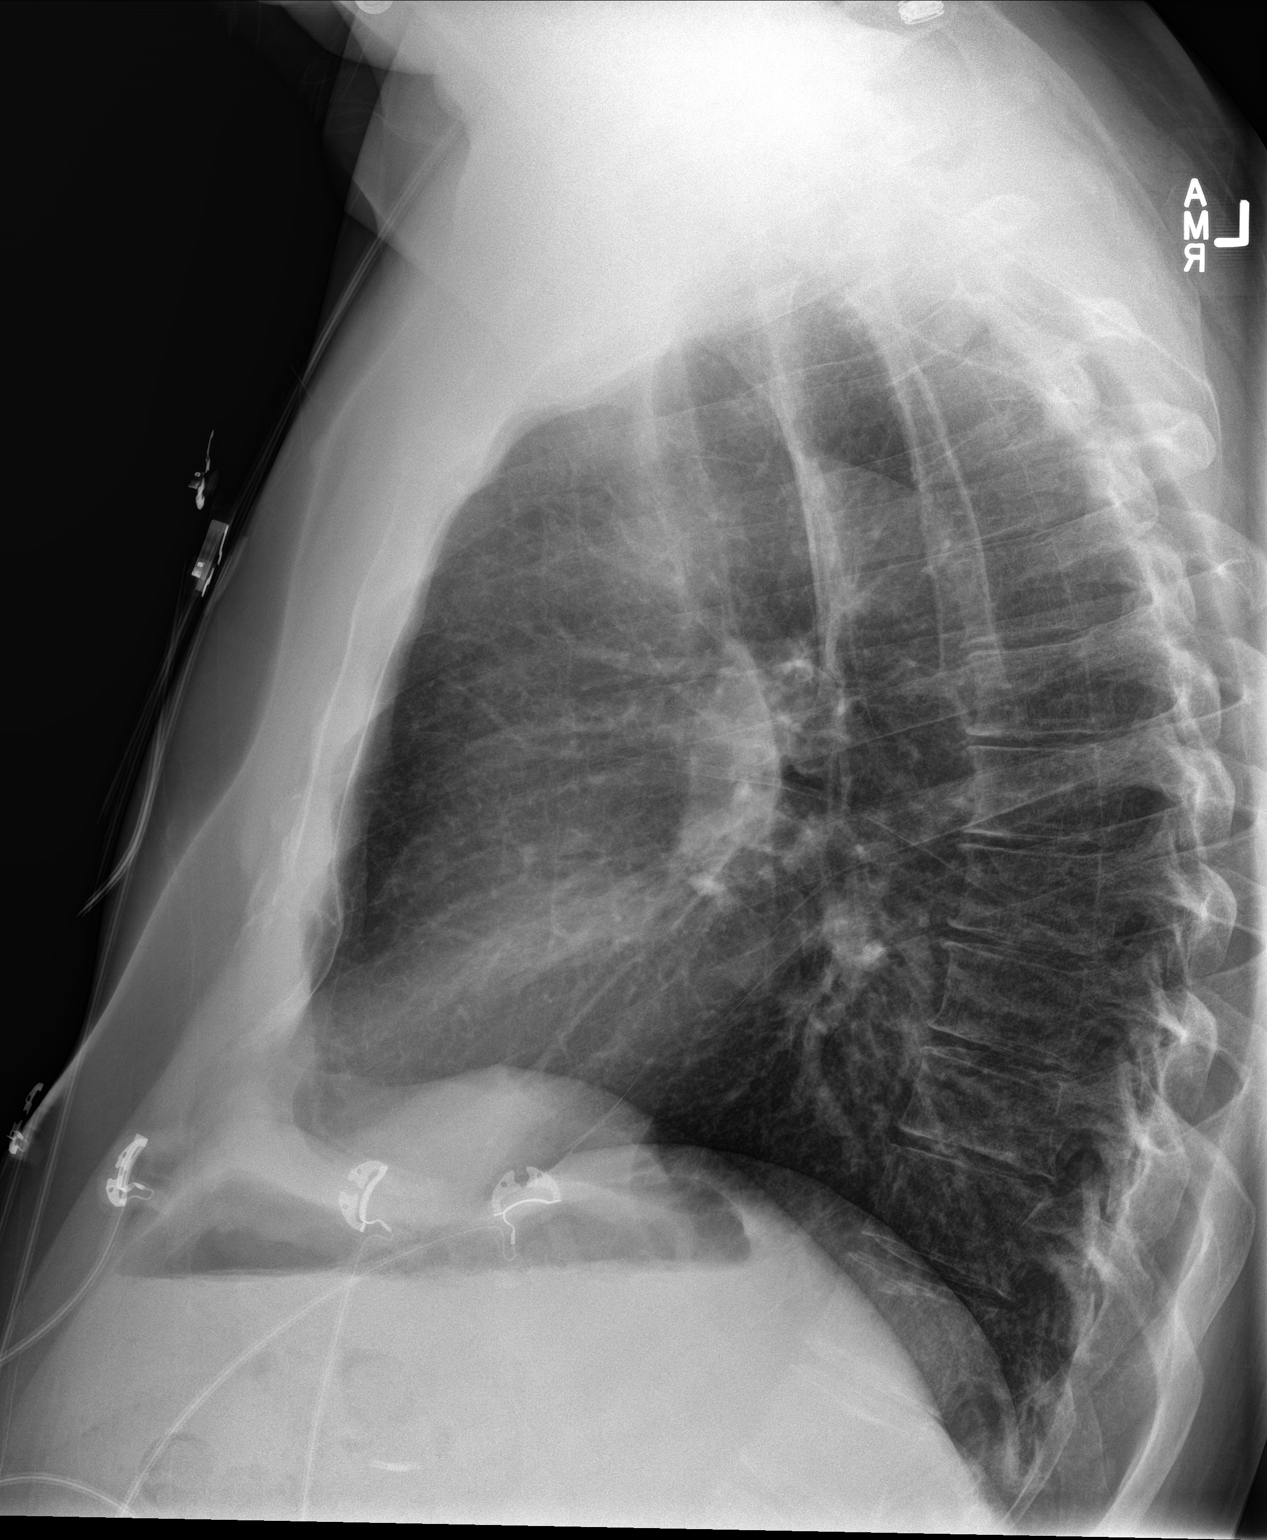

[2 of 2 positions shown; findings below may reference images not displayed]

FINDINGS: The lungs are well-expanded and clear. The heart and pulmonary
vascularity are normal. The mediastinum is normal in width. There is
no pleural effusion. The bony thorax exhibits no acute abnormality.
IMPRESSION: There is no pneumonia nor other acute cardiopulmonary abnormality.

## 2018-02-23 MED ORDER — ASPIRIN 81 MG PO CHEW
324.0000 mg | CHEWABLE_TABLET | Freq: Once | ORAL | Status: AC
  Administered 2018-02-23: 324 mg via ORAL
  Filled 2018-02-23: qty 4

## 2018-02-23 MED ORDER — NITROGLYCERIN 0.4 MG SL SUBL
0.4000 mg | SUBLINGUAL_TABLET | Freq: Once | SUBLINGUAL | Status: AC
  Administered 2018-02-23: 0.4 mg via SUBLINGUAL
  Filled 2018-02-23: qty 1

## 2018-02-23 MED ORDER — NITROGLYCERIN 0.4 MG SL SUBL: 0.4000 mg | SUBLINGUAL_TABLET | SUBLINGUAL | 0 refills | Status: DC | PRN

## 2018-02-23 MED ORDER — ASPIRIN 81 MG PO CHEW: 81.0000 mg | CHEWABLE_TABLET | Freq: Every day | ORAL | 0 refills | Status: DC

## 2018-02-23 NOTE — ED Notes (Signed)
Pt transported to xray 

## 2018-02-23 NOTE — ED Triage Notes (Signed)
Pt reports right sided chest pressure intermittently for the past 2 days.  This morning was eating breakfast and broke out in a sweat, so his friend thought he should be checked.

## 2018-02-23 NOTE — Discharge Instructions (Signed)
Follow-up either with the cardiologist at the [facility] or with Dr. Domenic Polite here in [place].  Return immediately for any worsening of your symptoms, persistence of your symptoms or any concerns.

## 2018-02-23 NOTE — ED Notes (Signed)
EDP at bedside updating patient. 

## 2018-02-23 NOTE — ED Notes (Signed)
Phlebotomy at bedside.

## 2018-02-23 NOTE — ED Provider Notes (Signed)
[facility] EMERGENCY DEPARTMENT Provider Note   CSN: [id number] Arrival date & time: 02/23/18  0957     History   Chief Complaint Chief Complaint  Patient presents with  . Chest Pain    HPI Clinton Ross is a 52 y.o. male.  HPI Patient with no prior history of CAD presents with episodic chest pain that he describes as pressure-like for the last few days.  Brought on when patient becomes upset.  States the pain resolved spontaneously with rest.  Had an episode today while driving.  Describes central chest pressure and diaphoresis.  Improved significantly though states still has mild pressure.  Patient has a long smoking history.  25-pack-year.  No known family history of coronary artery disease. Past Medical History:  Diagnosis Date  . Acute pancreatitis 10/2013  . Arthritis   . Bulging disc   . Cancer [facility])    Colon  . Chronic knee pain   . Colostomy in place [facility])   . Gallstones   . Hypertension   . PTSD (post-traumatic stress disorder)   . Sciatica     Patient Active Problem List   Diagnosis Date Noted  . Chest pain 07/21/2016  . Cholelithiasis 11/28/2013  . Tobacco abuse 11/28/2013  . Abdominal pain 11/27/2013  . Acute pancreatitis 11/20/2013  . Constipation 11/20/2013  . HTN (hypertension), benign 11/20/2013  . PTSD (post-traumatic stress disorder) 11/20/2013  . Sciatica 05/24/2009  . DERANGEMENT MENISCUS 10/24/2008  . JOINT EFFUSION, KNEE 10/24/2008  . KNEE PAIN 10/24/2008    Past Surgical History:  Procedure Laterality Date  . CHOLECYSTECTOMY    . COLON SURGERY    . rectal cancer Right         Home Medications    Prior to Admission medications   Medication Sig Start Date End Date Taking? Authorizing Provider  cetirizine (ZYRTEC) 10 MG tablet Take 10 mg by mouth daily.   Yes [provider]  cyclobenzaprine (FLEXERIL) 10 MG tablet Take 10 mg by mouth 3 (three) times daily as needed for muscle spasms.   Yes [provider]    HYDROcodone-acetaminophen (NORCO/VICODIN) 5-325 MG tablet Take 1 tablet by mouth 3 (three) times daily as needed for moderate pain.   Yes [provider]  lisinopril (PRINIVIL,ZESTRIL) 40 MG tablet Take 20 mg by mouth daily.   Yes [provider]  sertraline (ZOLOFT) 100 MG tablet Take 100 mg by mouth 2 (two) times daily.   Yes [provider]  aspirin 81 MG chewable tablet Chew 1 tablet (81 mg total) by mouth daily. 02/23/18   Julianne Rice, MD  nitroGLYCERIN (NITROSTAT) 0.4 MG SL tablet Place 1 tablet (0.4 mg total) under the tongue every 5 (five) minutes as needed for chest pain. 02/23/18   Julianne Rice, MD    Family History Family History  Problem Relation Age of Onset  . Hypertension Mother   . Stroke Father   . Diabetes Other     Social History Social History   Tobacco Use  . Smoking status: Current Every Day Smoker    Packs/day: 1.00    Years: 30.00    Pack years: 30.00    Types: Cigarettes  . Smokeless tobacco: Never Used  Substance Use Topics  . Alcohol use: No  . Drug use: No     Allergies   Peanut-containing drug products and Morphine and related   Review of Systems Review of Systems  Constitutional: Positive for diaphoresis. Negative for chills and fever.  HENT:  Negative for congestion and trouble swallowing.   Eyes: Negative for visual disturbance.  Respiratory: Positive for chest tightness. Negative for cough, shortness of breath and wheezing.   Cardiovascular: Positive for chest pain. Negative for palpitations and leg swelling.  Gastrointestinal: Negative for abdominal pain, blood in stool, constipation, diarrhea, nausea and vomiting.  Genitourinary: Negative for dysuria, flank pain, frequency and hematuria.  Musculoskeletal: Positive for back pain and myalgias. Negative for neck pain.  Skin: Negative for rash and wound.  Neurological: Negative for dizziness, weakness, light-headedness, numbness and headaches.  All other  systems reviewed and are negative.    Physical Exam Updated Vital Signs BP 115/74   Pulse 64   Temp 98.5 F (36.9 C) (Oral)   Resp (!) 9   Ht 5\' 10"  (1.778 m)   Wt 86.2 kg (190 lb)   SpO2 97%   BMI 27.26 kg/m   Physical Exam  Constitutional: He is oriented to person, place, and time. He appears well-developed and well-nourished. No distress.  HENT:  Head: Normocephalic and atraumatic.  Mouth/Throat: Oropharynx is clear and moist. No oropharyngeal exudate.  Eyes: Pupils are equal, round, and reactive to light. EOM are normal.  Neck: Normal range of motion. Neck supple. No JVD present.  Cardiovascular: Normal rate and regular rhythm. Exam reveals no gallop and no friction rub.  No murmur heard. Pulmonary/Chest: Effort normal and breath sounds normal. No stridor. No respiratory distress. He has no wheezes. He has no rales. He exhibits no tenderness.  Abdominal: Soft. Bowel sounds are normal. There is no tenderness. There is no rebound and no guarding.  Musculoskeletal: Normal range of motion. He exhibits no edema or tenderness.  No lower extremity asymmetry or swelling.  No focal midline thoracic or cervical lumbar tenderness to palpation.  No step-offs or deformity.  Lymphadenopathy:    He has no cervical adenopathy.  Neurological: He is alert and oriented to person, place, and time.  Moves all extremities without focal deficit.  Sensation intact.  Ambulates without difficulty.  Skin: Skin is warm and dry. Capillary refill takes less than 2 seconds. No rash noted. He is not diaphoretic. No erythema.  Psychiatric: He has a normal mood and affect. His behavior is normal.  Nursing note and vitals reviewed.    ED Treatments / Results  Labs (all labs ordered are listed, but only abnormal results are displayed) Labs Reviewed  BASIC METABOLIC PANEL - Abnormal; Notable for the following components:      Result Value   Glucose, Bld 171 (*)    All other components within normal  limits  CBC  TROPONIN I  TROPONIN I    EKG EKG Interpretation  Date/Time:  Monday February 23 2018 10:11:06 EDT Ventricular Rate:  85 PR Interval:    QRS Duration: 86 QT Interval:  348 QTC Calculation: 414 R Axis:   59 Text Interpretation:  Sinus rhythm Left atrial enlargement ST elev, probable normal early repol pattern Confirmed by Julianne Rice [phone]) on 02/23/2018 11:09:45 AM   Radiology Dg Chest 2 View  Result Date: 02/23/2018 CLINICAL DATA:  Chest pain and chest heaviness began this morning associated with diaphoresis. Episode of nausea with metallic taste in the mouth last night. History of colonic malignancy. EXAM: CHEST - 2 VIEW COMPARISON:  CT scan chest of June 12, 2017 and chest x-ray of July 21, 2016 FINDINGS: The lungs are well-expanded and clear. The heart and pulmonary vascularity are normal. The mediastinum is normal in width. There is no pleural  effusion. The bony thorax exhibits no acute abnormality. IMPRESSION: There is no pneumonia nor other acute cardiopulmonary abnormality. Electronically Signed   By: Tahjay Binion  [place] M.D.   On: 02/23/2018 10:36    Procedures Procedures (including critical care time)  Medications Ordered in ED Medications  aspirin chewable tablet 324 mg (324 mg Oral Given 02/23/18 1115)  nitroGLYCERIN (NITROSTAT) SL tablet 0.4 mg (0.4 mg Sublingual Given 02/23/18 1115)     Initial Impression / Assessment and Plan / ED Course  I have reviewed the triage vital signs and the nursing notes.  Pertinent labs & imaging results that were available during my care of the patient were reviewed by me and considered in my medical decision making (see chart for details).     Given aspirin and 1 nitroglycerin.  Complete resolution of chest pressure.  EKG without ischemic findings.  Troponin x2 is normal.  Discussed with Dr. Domenic Polite.  Advised starting patient on daily aspirin and nitroglycerin as needed and close follow-up with cardiology as  outpatient.  Patient states he normally follows up with the [place].  He understands the need to return immediately for any worsening of his symptoms or concerns.  Final Clinical Impressions(s) / ED Diagnoses   Final diagnoses:  Precordial chest pain    ED Discharge Orders        Ordered    aspirin 81 MG chewable tablet  Daily     02/23/18 1355    nitroGLYCERIN (NITROSTAT) 0.4 MG SL tablet  Every 5 min PRN     02/23/18 1355       Julianne Rice, MD 02/23/18 1355

## 2018-05-07 ENCOUNTER — Encounter (HOSPITAL_COMMUNITY): Payer: Self-pay | Admitting: Emergency Medicine

## 2018-05-07 ENCOUNTER — Other Ambulatory Visit: Payer: Self-pay

## 2018-05-07 ENCOUNTER — Emergency Department (HOSPITAL_COMMUNITY): Payer: Non-veteran care

## 2018-05-07 ENCOUNTER — Emergency Department (HOSPITAL_COMMUNITY)
Admission: EM | Admit: 2018-05-07 | Discharge: 2018-05-07 | Disposition: A | Discharge status: Home / Self Care | Payer: Non-veteran care | Attending: Emergency Medicine | Admitting: Emergency Medicine

## 2018-05-07 DIAGNOSIS — Z91010 Allergy to peanuts: Secondary | ICD-10-CM | POA: Diagnosis not present

## 2018-05-07 DIAGNOSIS — K5900 Constipation, unspecified: Secondary | ICD-10-CM | POA: Diagnosis not present

## 2018-05-07 DIAGNOSIS — I10 Essential (primary) hypertension: Secondary | ICD-10-CM | POA: Diagnosis not present

## 2018-05-07 DIAGNOSIS — Z933 Colostomy status: Secondary | ICD-10-CM | POA: Diagnosis not present

## 2018-05-07 DIAGNOSIS — Z9049 Acquired absence of other specified parts of digestive tract: Secondary | ICD-10-CM | POA: Insufficient documentation

## 2018-05-07 DIAGNOSIS — Z85038 Personal history of other malignant neoplasm of large intestine: Secondary | ICD-10-CM | POA: Insufficient documentation

## 2018-05-07 DIAGNOSIS — F17210 Nicotine dependence, cigarettes, uncomplicated: Secondary | ICD-10-CM | POA: Insufficient documentation

## 2018-05-07 DIAGNOSIS — R1084 Generalized abdominal pain: Secondary | ICD-10-CM

## 2018-05-07 LAB — URINALYSIS, ROUTINE W REFLEX MICROSCOPIC
BACTERIA UA: NONE SEEN
Bilirubin Urine: NEGATIVE
Glucose, UA: NEGATIVE mg/dL
Ketones, ur: NEGATIVE mg/dL
Leukocytes, UA: NEGATIVE
Nitrite: NEGATIVE
Protein, ur: NEGATIVE mg/dL
SPECIFIC GRAVITY, URINE: 1.006 (ref 1.005–1.030)
pH: 6 (ref 5.0–8.0)

## 2018-05-07 LAB — COMPREHENSIVE METABOLIC PANEL
ALBUMIN: 3.5 g/dL (ref 3.5–5.0)
ALK PHOS: 128 U/L — AB (ref 38–126)
ALT: 19 U/L (ref 0–44)
ANION GAP: 8 (ref 5–15)
AST: 15 U/L (ref 15–41)
BUN: 12 mg/dL (ref 6–20)
CALCIUM: 8.9 mg/dL (ref 8.9–10.3)
CHLORIDE: 104 mmol/L (ref 98–111)
CO2: 27 mmol/L (ref 22–32)
Creatinine, Ser: 0.96 mg/dL (ref 0.61–1.24)
GFR calc Af Amer: >60 mL/min (ref >60)
GFR calc non Af Amer: >60 mL/min (ref >60)
GLUCOSE: 101 mg/dL — AB (ref 70–99)
Potassium: 3.5 mmol/L (ref 3.5–5.1)
SODIUM: 139 mmol/L (ref 135–145)
Total Bilirubin: 0.6 mg/dL (ref 0.3–1.2)
Total Protein: 6.6 g/dL (ref 6.5–8.1)

## 2018-05-07 LAB — CBC
HCT: 45.5 % (ref 39.0–52.0)
HEMOGLOBIN: 14.7 g/dL (ref 13.0–17.0)
MCH: 27 pg (ref 26.0–34.0)
MCHC: 32.3 g/dL (ref 30.0–36.0)
MCV: 83.6 fL (ref 78.0–100.0)
Platelets: 186 10*3/uL (ref 150–400)
RBC: 5.44 MIL/uL (ref 4.22–5.81)
RDW: 13.9 % (ref 11.5–15.5)
WBC: 7.7 10*3/uL (ref 4.0–10.5)

## 2018-05-07 LAB — LIPASE, BLOOD: LIPASE: 42 U/L (ref 11–51)

## 2018-05-07 IMAGING — CT CT ABD-PELV W/ CM
2 of 5 series · 16 of 46 positions shown, 18 images · IV contrast (Isovue)
Comparison: [DATE]

CLINICAL DATA: Constipation. Acute pancreatitis. History of treated
colon cancer with colostomy.

EXAM:
CT ABDOMEN AND PELVIS WITH CONTRAST
TECHNIQUE: Multidetector CT imaging of the abdomen and pelvis was performed
using the standard protocol following bolus administration of
intravenous contrast.
CONTRAST:  100mL [DH] IOPAMIDOL ([DH]) INJECTION 61%

[Series 2: axial st · axial · 0.67mm/px · z∈[+912,+1368]mm · 13 of 105 slices shown, 15 images]
[im 7/105  soft-tissue]
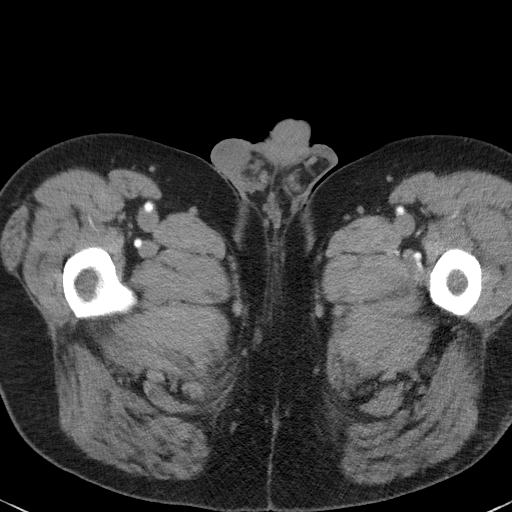
[im 7/105  bone]
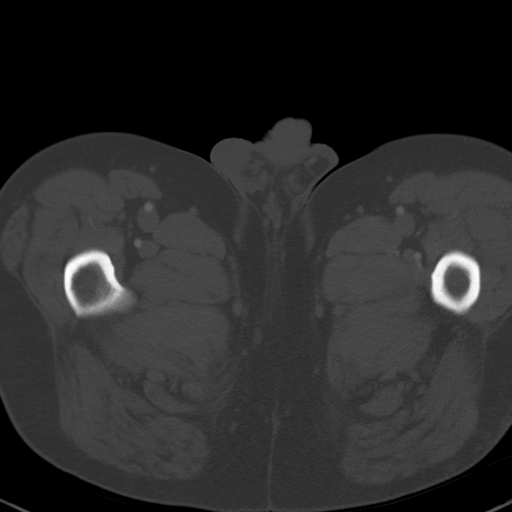
[im 13/105  soft-tissue]
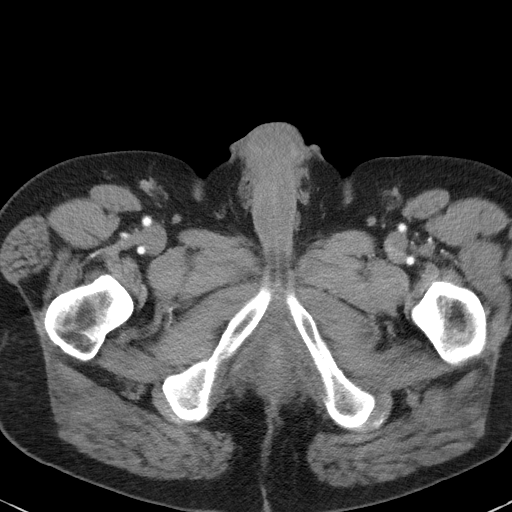
[im 25/105  soft-tissue]
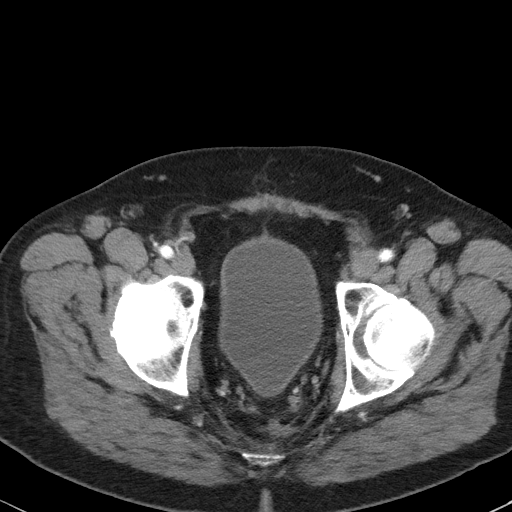
[im 31/105  soft-tissue]
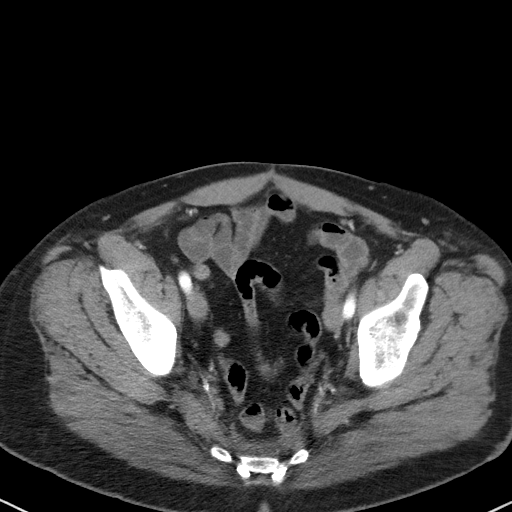
[im 37/105  soft-tissue]
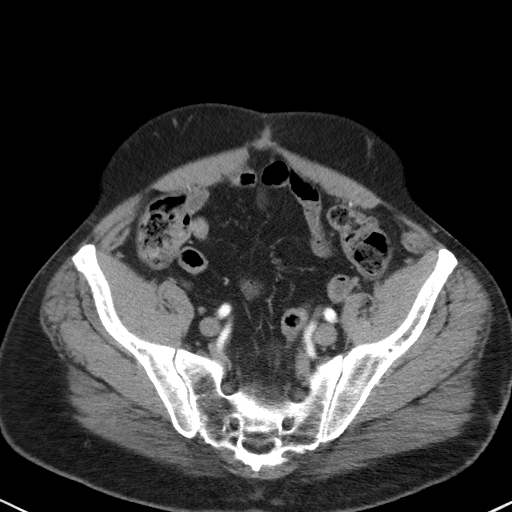
[im 43/105  soft-tissue]
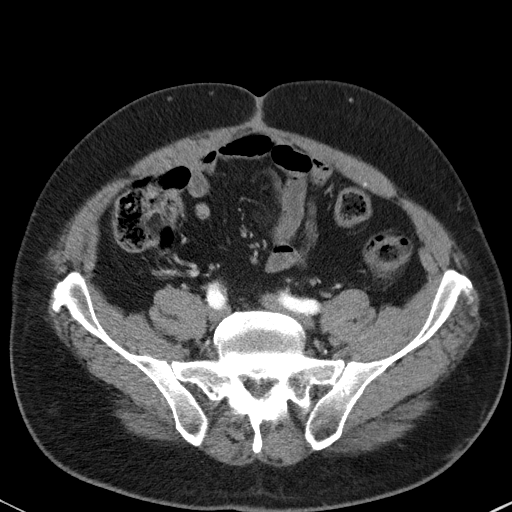
[im 56/105  soft-tissue]
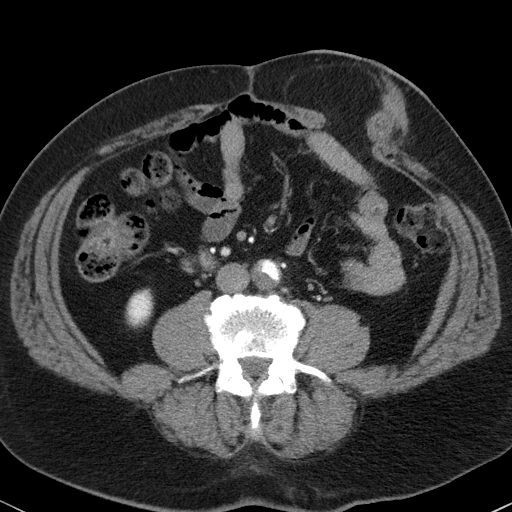
[im 62/105  soft-tissue]
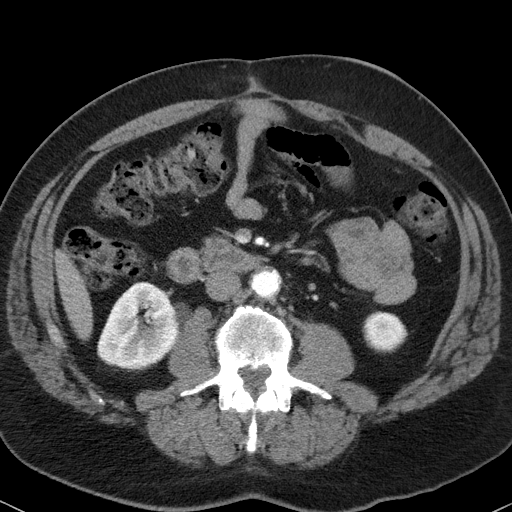
[im 68/105  soft-tissue]
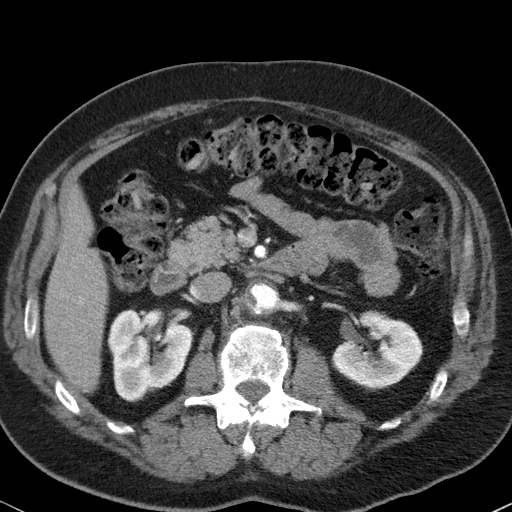
[im 68/105  bone]
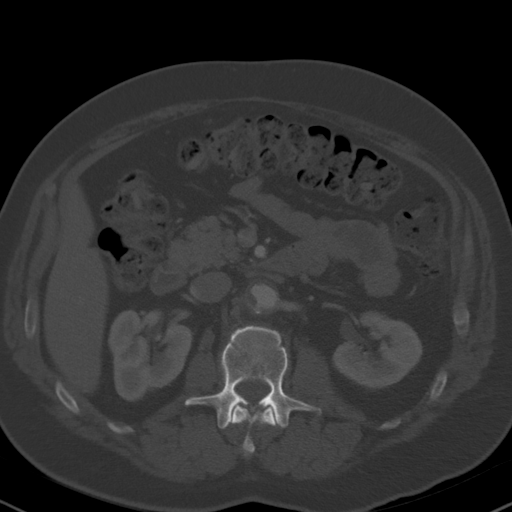
[im 74/105  soft-tissue]
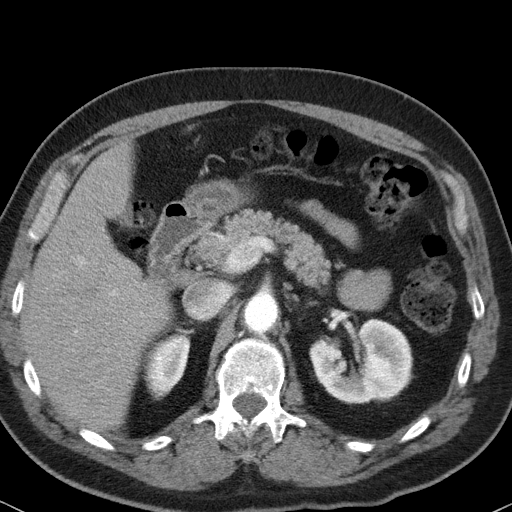
[im 80/105  soft-tissue]
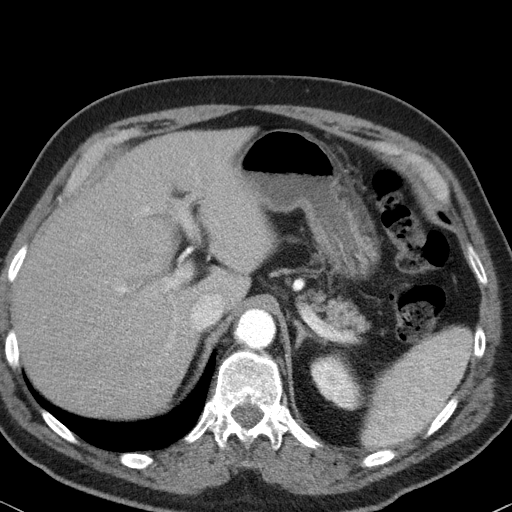
[im 92/105  soft-tissue]
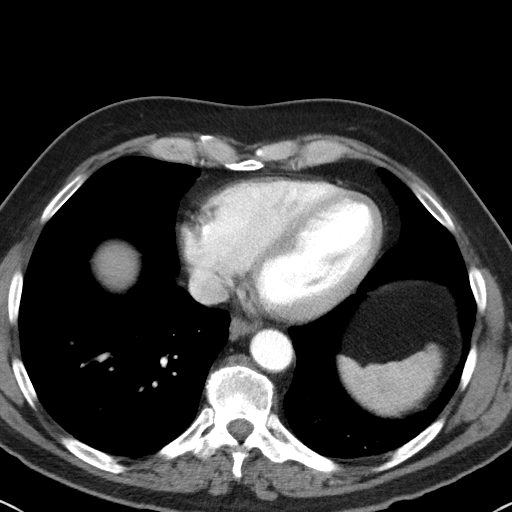
[im 98/105  soft-tissue]
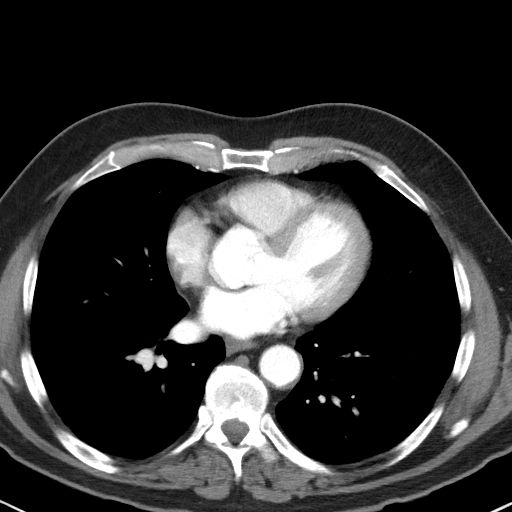

[Series 6: coronal st · coronal · 0.91mm/px · 3 of 111 slices shown]
[im 37/111  soft-tissue]
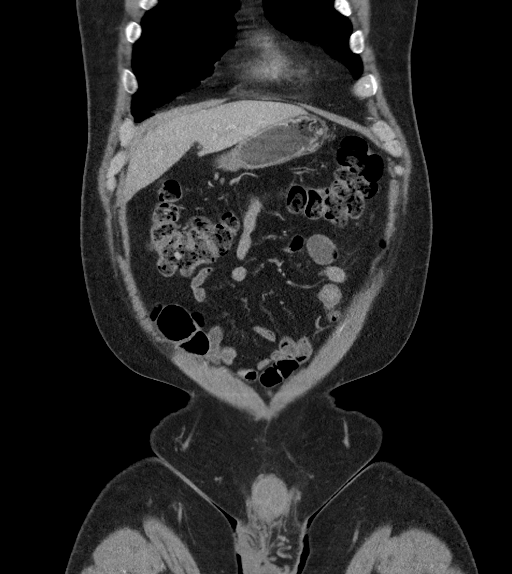
[im 49/111  soft-tissue]
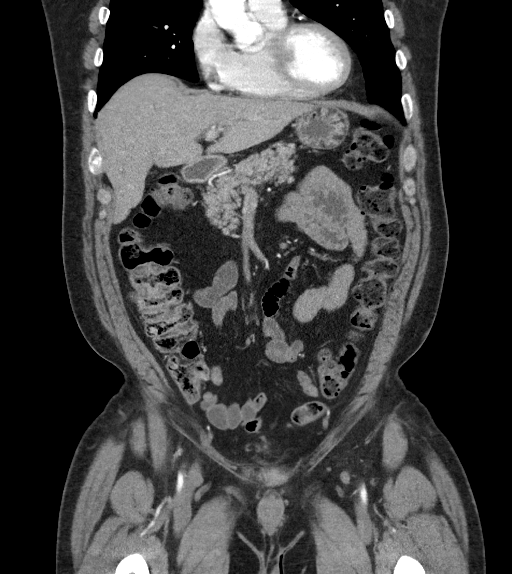
[im 62/111  soft-tissue]
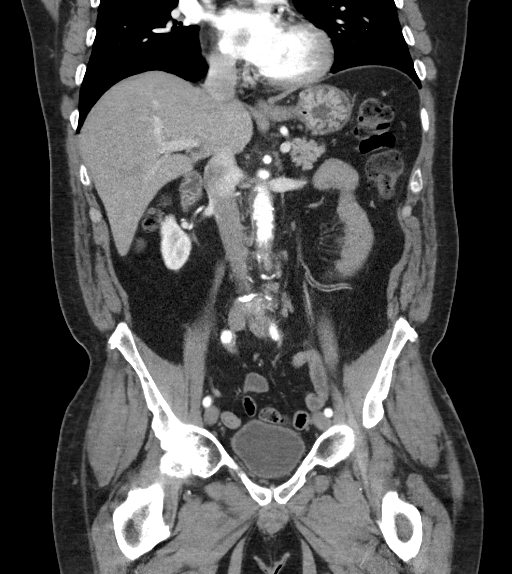

[16 of 46 positions shown; findings below may reference images not displayed]

FINDINGS: Lower chest: No acute abnormality.

Hepatobiliary: No focal liver abnormality is seen. Status post
cholecystectomy. No biliary dilatation.

Pancreas: Unremarkable. No pancreatic ductal dilatation or
surrounding inflammatory changes.

Spleen: Normal in size without focal abnormality.

Adrenals/Urinary Tract: Adrenal glands are unremarkable. Kidneys are
without renal calculi, solid lesion, or hydronephrosis. 2.7 cm right
renal cyst. Bladder is unremarkable.

Stomach/Bowel: Normal appearance of the stomach and small bowel.
Post left colonic resection with left mid abdomen colostomy. There
is a wide neck fat containing peristomal hernia.

Vascular/Lymphatic: Aortic atherosclerosis with significant
noncalcified and calcified plaque in the lower abdominal aorta. No
enlarged abdominal or pelvic lymph nodes.

Reproductive: Prostate is unremarkable.

Other: Fat containing supraumbilical anterior abdominal wall hernia.

Musculoskeletal: Spondylosis of the lumbosacral spine.
IMPRESSION: Post left colonic resection with left mid abdomen colostomy. Wide
neck fat containing peristomal hernia. No evidence of incarceration
of the exiting bowel.

Advanced for age atherosclerotic disease of the aorta with
significant calcified and noncalcified plaque in the lower abdominal
aorta.

## 2018-05-07 MED ORDER — IOPAMIDOL (ISOVUE-300) INJECTION 61%
100.0000 mL | Freq: Once | INTRAVENOUS | Status: AC | PRN
  Administered 2018-05-07: 100 mL via INTRAVENOUS

## 2018-05-07 MED ORDER — SENNOSIDES-DOCUSATE SODIUM 8.6-50 MG PO TABS: 1.0000 | ORAL_TABLET | Freq: Every day | ORAL | 0 refills | Status: AC

## 2018-05-07 MED ORDER — POLYETHYLENE GLYCOL 3350 17 G PO PACK: 17.0000 g | PACK | Freq: Every day | ORAL | 0 refills | Status: AC

## 2018-05-07 MED ORDER — SODIUM CHLORIDE 0.9 % IV BOLUS
500.0000 mL | Freq: Once | INTRAVENOUS | Status: AC
  Administered 2018-05-07: 500 mL via INTRAVENOUS

## 2018-05-07 NOTE — ED Triage Notes (Signed)
Pt started prepping for colonoscopy on Sunday, after one quart her started vomiting only had one BM. Procedure was cancelled, Not able to have a BM[facility] showed large stool burden.  Pt has colostomy bag, hx colon cancer. Abdominal  Distention,

## 2018-05-07 NOTE — ED Notes (Signed)
Noted to have colostomy bag . No stool noted . Stoma pink in color

## 2018-05-07 NOTE — ED Provider Notes (Signed)
Emergency Department Provider Note   I have reviewed the triage vital signs and the nursing notes.   HISTORY  Chief Complaint Abdominal Pain   HPI Clinton Ross is a 52 y.o. male with PMH of colon cancer s/p resection and colostomy placement presents to the emergency department with no bowel movement in the last 4 days.  The patient began prepping for a colonoscopy 4 days ago with [facility].  The had one bowel movement early on but then began vomiting anytime he would try and take the medication.  He called the nurse line who encouraged him to do smaller amounts at a time which she tried but did not work.  The colonoscopy had to be canceled.  He continues to eat and drink normally.  He denies abdominal pain or significant distention.  He does have a sensation that something is there and needs to come out but is not passed any stool.  He denies any fevers or chills.  He is passing air into the bag. Denies seeing any blood.   Past Medical History:  Diagnosis Date  . Acute pancreatitis 10/2013  . Arthritis   . Bulging disc   . Cancer [facility])    Colon  . Chronic knee pain   . Colostomy in place [facility])   . Gallstones   . Hypertension   . PTSD (post-traumatic stress disorder)   . Sciatica     Patient Active Problem List   Diagnosis Date Noted  . Chest pain 07/21/2016  . Cholelithiasis 11/28/2013  . Tobacco abuse 11/28/2013  . Abdominal pain 11/27/2013  . Acute pancreatitis 11/20/2013  . Constipation 11/20/2013  . HTN (hypertension), benign 11/20/2013  . PTSD (post-traumatic stress disorder) 11/20/2013  . Sciatica 05/24/2009  . DERANGEMENT MENISCUS 10/24/2008  . JOINT EFFUSION, KNEE 10/24/2008  . KNEE PAIN 10/24/2008    Past Surgical History:  Procedure Laterality Date  . CHOLECYSTECTOMY    . COLON SURGERY    . rectal cancer Right     Allergies Peanut-containing drug products and Morphine and related  Family History  Problem Relation Age of Onset  . Hypertension  Mother   . Stroke Father   . Diabetes Other     Social History Social History   Tobacco Use  . Smoking status: Current Every Day Smoker    Packs/day: 0.50    Years: 30.00    Pack years: 15.00    Types: Cigarettes  . Smokeless tobacco: Never Used  Substance Use Topics  . Alcohol use: No  . Drug use: No    Review of Systems  Constitutional: No fever/chills Eyes: No visual changes. ENT: No sore throat. Cardiovascular: Denies chest pain. Respiratory: Denies shortness of breath. Gastrointestinal: No abdominal pain.  No nausea, Positive vomiting (resolved).  No diarrhea. Positive constipation. Genitourinary: Negative for dysuria. Musculoskeletal: Negative for back pain. Skin: Negative for rash. Neurological: Negative for headaches, focal weakness or numbness.  10-point ROS otherwise negative.  ____________________________________________   PHYSICAL EXAM:  VITAL SIGNS: ED Triage Vitals  Enc Vitals Group     BP 05/07/18 1713 (!) 184/101     Pulse Rate 05/07/18 1713 78     Resp 05/07/18 1713 16     Temp 05/07/18 1713 98.5 F (36.9 C)     Temp src --      SpO2 05/07/18 1713 97 %     Weight 05/07/18 1712 200 lb (90.7 kg)     Height 05/07/18 1712 5\' 9"  (1.753 m)  Pain Score 05/07/18 1714 4   Constitutional: Alert and oriented. Well appearing and in no acute distress. Eyes: Conjunctivae are normal. Head: Atraumatic. Nose: No congestion/rhinnorhea. Mouth/Throat: Mucous membranes are moist. Neck: No stridor.  Cardiovascular: Normal rate, regular rhythm. Good peripheral circulation. Grossly normal heart sounds.   Respiratory: Normal respiratory effort.  No retractions. Lungs CTAB. Gastrointestinal: Soft and nontender. Mild distention. Empty ostomy bag. No surrounding cellulitis. No herniation.  Musculoskeletal: No lower extremity tenderness nor edema. No gross deformities of extremities. Neurologic:  Normal speech and language. No gross focal neurologic deficits are  appreciated.  Skin:  Skin is warm, dry and intact. No rash noted.  ____________________________________________   LABS (all labs ordered are listed, but only abnormal results are displayed)  Labs Reviewed  COMPREHENSIVE METABOLIC PANEL - Abnormal; Notable for the following components:      Result Value   Glucose, Bld 101 (*)    Alkaline Phosphatase 128 (*)    All other components within normal limits  URINALYSIS, ROUTINE W REFLEX MICROSCOPIC - Abnormal; Notable for the following components:   Color, Urine STRAW (*)    Hgb urine dipstick MODERATE (*)    All other components within normal limits  LIPASE, BLOOD  CBC   ____________________________________________  RADIOLOGY  Ct Abdomen Pelvis W Contrast  Result Date: 05/07/2018 CLINICAL DATA:  Constipation. Acute pancreatitis. History of treated colon cancer with colostomy. EXAM: CT ABDOMEN AND PELVIS WITH CONTRAST TECHNIQUE: Multidetector CT imaging of the abdomen and pelvis was performed using the standard protocol following bolus administration of intravenous contrast. CONTRAST:  122mL ISOVUE-300 IOPAMIDOL (ISOVUE-300) INJECTION 61% COMPARISON:  12/04/2013 FINDINGS: Lower chest: No acute abnormality. Hepatobiliary: No focal liver abnormality is seen. Status post cholecystectomy. No biliary dilatation. Pancreas: Unremarkable. No pancreatic ductal dilatation or surrounding inflammatory changes. Spleen: Normal in size without focal abnormality. Adrenals/Urinary Tract: Adrenal glands are unremarkable. Kidneys are without renal calculi, solid lesion, or hydronephrosis. 2.7 cm right renal cyst. Bladder is unremarkable. Stomach/Bowel: Normal appearance of the stomach and small bowel. Post left colonic resection with left mid abdomen colostomy. There is a wide neck fat containing peristomal hernia. Vascular/Lymphatic: Aortic atherosclerosis with significant noncalcified and calcified plaque in the lower abdominal aorta. No enlarged abdominal or  pelvic lymph nodes. Reproductive: Prostate is unremarkable. Other: Fat containing supraumbilical anterior abdominal wall hernia. Musculoskeletal: Spondylosis of the lumbosacral spine. IMPRESSION: Post left colonic resection with left mid abdomen colostomy. Wide neck fat containing peristomal hernia. No evidence of incarceration of the exiting bowel. Advanced for age atherosclerotic disease of the aorta with significant calcified and noncalcified plaque in the lower abdominal aorta. Electronically Signed   By: Fidela Salisbury M.D.   On: 05/07/2018 20:56    ____________________________________________   PROCEDURES  Procedure(s) performed:   Procedures  None ____________________________________________   INITIAL IMPRESSION / ASSESSMENT AND PLAN / ED COURSE  Pertinent labs & imaging results that were available during my care of the patient were reviewed by me and considered in my medical decision making (see chart for details).  Patient presents to the emergency department with no bowel movement for the past 4 days.  He has a history of colon cancer status post resection and has an ostomy bag.  He attempted bowel prep for colonoscopy but had vomiting during this.  He is been eating and drinking normally since that time with no symptoms.  Lower suspicion for obstruction.  Patient's abdomen is soft and nontender although he does have some distention.  Given his complicated surgical  history plan for CT abdomen pelvis with oral contrast.  Labs reviewed with no acute findings.  Patient is well-appearing.  CT imaging reviewed. No SBO or other acute finding. Will continue f/u with GI at the [place]. Plan for Miralax at home along with Senna-S.   At this time, I do not feel there is any life-threatening condition present. I have reviewed and discussed all results (EKG, imaging, lab, urine as appropriate), exam findings with patient. I have reviewed nursing notes and appropriate previous records.  I feel  the patient is safe to be discharged home without further emergent workup. Discussed usual and customary return precautions. Patient and family (if present) verbalize understanding and are comfortable with this plan.  Patient will follow-up with their primary care provider. If they do not have a primary care provider, information for follow-up has been provided to them. All questions have been answered.  ____________________________________________  FINAL CLINICAL IMPRESSION(S) / ED DIAGNOSES  Final diagnoses:  Generalized abdominal pain  Constipation, unspecified constipation type     MEDICATIONS GIVEN DURING THIS VISIT:  Medications  sodium chloride 0.9 % bolus 500 mL (0 mLs Intravenous Stopped 05/07/18 1930)  iopamidol (ISOVUE-300) 61 % injection 100 mL (100 mLs Intravenous Contrast Given 05/07/18 2015)     NEW OUTPATIENT MEDICATIONS STARTED DURING THIS VISIT:  Discharge Medication List as of 05/07/2018  9:10 PM    START taking these medications   Details  polyethylene glycol (MIRALAX) packet Take 17 g by mouth daily for 14 days., Starting Thu 05/07/2018, Until Thu 05/21/2018, Print    senna-docusate (SENOKOT-S) 8.6-50 MG tablet Take 1 tablet by mouth at bedtime for 7 days., Starting Thu 05/07/2018, Until Thu 05/14/2018, Print        Note:  This document was prepared using Dragon voice recognition software and may include unintentional dictation errors.  Nanda Quinton, MD Emergency Medicine    Floria Brandau, [facility], MD 05/07/18 [phone]

## 2018-05-07 NOTE — Discharge Instructions (Signed)
You were seen in the emergency department today for constipation.  We recommend that you use one or more of the following over-the-counter medications in the order described: °  °1)  Colace (or Dulcolax) 100 mg:  This is a stool softener, and you may take it once or twice a day as needed. °2)  Senna tablets:  This is a bowel stimulant that will help "push" out your stool. It is the next step to add after you have tried a stool softener. °3)  Miralax (powder):  This medication works by drawing additional fluid into your intestines and helps to flush out your stool.  Mix the powder with water or juice according to label instructions.  It may help if the Colace and Senna are not sufficient, but you must be sure to use the recommended amount of water or juice when you mix up the powder. °Remember that narcotic pain medications are constipating, so avoid them or minimize their use.  Drink plenty of fluids. ° °Please return to the Emergency Department immediately if you develop new or worsening symptoms that concern you, such as (but not limited to) fever > 101 degrees, severe abdominal pain, or persistent vomiting. ° ° °Constipation °Constipation is when a person has fewer than three bowel movements a week, has difficulty having a bowel movement, or has stools that are dry, hard, or larger than normal. As people grow older, constipation is more common. If you try to fix constipation with medicines that make you have a bowel movement (laxatives), the problem may get worse. Hadja Harral-term laxative use may cause the muscles of the colon to become weak. A low-fiber diet, not taking in enough fluids, and taking certain medicines may make constipation worse.  °CAUSES  °Certain medicines, such as antidepressants, pain medicine, iron supplements, antacids, and water pills.   °Certain diseases, such as diabetes, irritable bowel syndrome (IBS), thyroid disease, or depression.   °Not drinking enough water.   °Not eating enough  fiber-rich foods.   °Stress or travel.   °Lack of physical activity or exercise.   °Ignoring the urge to have a bowel movement.   °Using laxatives too much.   °SIGNS AND SYMPTOMS  °Having fewer than three bowel movements a week.   °Straining to have a bowel movement.   °Having stools that are hard, dry, or larger than normal.   °Feeling full or bloated.   °Pain in the lower abdomen.   °Not feeling relief after having a bowel movement.   °DIAGNOSIS  °Your health care provider will take a medical history and perform a physical exam. Further testing may be done for severe constipation. Some tests may include: °A barium enema X-ray to examine your rectum, colon, and, sometimes, your small intestine.   °A sigmoidoscopy to examine your lower colon.   °A colonoscopy to examine your entire colon. °TREATMENT  °Treatment will depend on the severity of your constipation and what is causing it. Some dietary treatments include drinking more fluids and eating more fiber-rich foods. Lifestyle treatments may include regular exercise. If these diet and lifestyle recommendations do not help, your health care provider may recommend taking over-the-counter laxative medicines to help you have bowel movements. Prescription medicines may be prescribed if over-the-counter medicines do not work.  °HOME CARE INSTRUCTIONS  °Eat foods that have a lot of fiber, such as fruits, vegetables, whole grains, and beans. °Limit foods high in fat and processed sugars, such as french fries, hamburgers, cookies, candies, and soda.   °A   fiber supplement may be added to your diet if you cannot get enough fiber from foods.   °Drink enough fluids to keep your urine clear or pale yellow.   °Exercise regularly or as directed by your health care provider.   °Go to the restroom when you have the urge to go. Do not hold it.   °Only take over-the-counter or prescription medicines as directed by your health care provider. Do not take other medicines for constipation  without talking to your health care provider first.   °SEEK IMMEDIATE MEDICAL CARE IF:  °You have bright red blood in your stool.   °Your constipation lasts for more than 4 days or gets worse.   °You have abdominal or rectal pain.   °You have thin, pencil-like stools.   °You have unexplained weight loss. °MAKE SURE YOU:  °Understand these instructions. °Will watch your condition. °Will get help right away if you are not doing well or get worse. °Document Released: 06/07/2004 Document Revised: 09/14/2013 Document Reviewed: 06/21/2013 °Exit[employer]® Patient Information ©2015 [employer]. This information is not intended to replace advice given to you by your health care provider. Make sure you discuss any questions you have with your health care provider. ° ° ° °

## 2018-06-08 ENCOUNTER — Emergency Department (HOSPITAL_COMMUNITY): Payer: No Typology Code available for payment source

## 2018-06-08 ENCOUNTER — Encounter (HOSPITAL_COMMUNITY): Payer: Self-pay | Admitting: Emergency Medicine

## 2018-06-08 ENCOUNTER — Other Ambulatory Visit: Payer: Self-pay

## 2018-06-08 ENCOUNTER — Inpatient Hospital Stay (HOSPITAL_COMMUNITY)
Admission: EM | Admit: 2018-06-08 | Discharge: 2018-06-10 | DRG: 440 | Disposition: A | Discharge status: Home / Self Care | Payer: No Typology Code available for payment source | Attending: Internal Medicine | Admitting: Internal Medicine

## 2018-06-08 DIAGNOSIS — F4310 Post-traumatic stress disorder, unspecified: Secondary | ICD-10-CM | POA: Diagnosis present

## 2018-06-08 DIAGNOSIS — Z933 Colostomy status: Secondary | ICD-10-CM

## 2018-06-08 DIAGNOSIS — Z716 Tobacco abuse counseling: Secondary | ICD-10-CM

## 2018-06-08 DIAGNOSIS — Z9221 Personal history of antineoplastic chemotherapy: Secondary | ICD-10-CM

## 2018-06-08 DIAGNOSIS — Z91018 Allergy to other foods: Secondary | ICD-10-CM

## 2018-06-08 DIAGNOSIS — Z91010 Allergy to peanuts: Secondary | ICD-10-CM | POA: Diagnosis not present

## 2018-06-08 DIAGNOSIS — G8929 Other chronic pain: Secondary | ICD-10-CM | POA: Diagnosis present

## 2018-06-08 DIAGNOSIS — F17210 Nicotine dependence, cigarettes, uncomplicated: Secondary | ICD-10-CM | POA: Diagnosis present

## 2018-06-08 DIAGNOSIS — Z9049 Acquired absence of other specified parts of digestive tract: Secondary | ICD-10-CM

## 2018-06-08 DIAGNOSIS — Z923 Personal history of irradiation: Secondary | ICD-10-CM

## 2018-06-08 DIAGNOSIS — Z720 Tobacco use: Secondary | ICD-10-CM | POA: Diagnosis present

## 2018-06-08 DIAGNOSIS — K5900 Constipation, unspecified: Secondary | ICD-10-CM | POA: Diagnosis present

## 2018-06-08 DIAGNOSIS — Z7982 Long term (current) use of aspirin: Secondary | ICD-10-CM

## 2018-06-08 DIAGNOSIS — K859 Acute pancreatitis without necrosis or infection, unspecified: Principal | ICD-10-CM | POA: Diagnosis present

## 2018-06-08 DIAGNOSIS — Z8249 Family history of ischemic heart disease and other diseases of the circulatory system: Secondary | ICD-10-CM | POA: Diagnosis not present

## 2018-06-08 DIAGNOSIS — Z885 Allergy status to narcotic agent status: Secondary | ICD-10-CM | POA: Diagnosis not present

## 2018-06-08 DIAGNOSIS — Z79899 Other long term (current) drug therapy: Secondary | ICD-10-CM

## 2018-06-08 DIAGNOSIS — Z85048 Personal history of other malignant neoplasm of rectum, rectosigmoid junction, and anus: Secondary | ICD-10-CM

## 2018-06-08 DIAGNOSIS — I10 Essential (primary) hypertension: Secondary | ICD-10-CM | POA: Diagnosis present

## 2018-06-08 DIAGNOSIS — K858 Other acute pancreatitis without necrosis or infection: Secondary | ICD-10-CM | POA: Diagnosis not present

## 2018-06-08 LAB — CBC
HEMATOCRIT: 50.7 % (ref 39.0–52.0)
HEMOGLOBIN: 16.9 g/dL (ref 13.0–17.0)
MCH: 27.8 pg (ref 26.0–34.0)
MCHC: 33.3 g/dL (ref 30.0–36.0)
MCV: 83.3 fL (ref 78.0–100.0)
Platelets: 223 10*3/uL (ref 150–400)
RBC: 6.09 MIL/uL — ABNORMAL HIGH (ref 4.22–5.81)
RDW: 14 % (ref 11.5–15.5)
WBC: 13 10*3/uL — ABNORMAL HIGH (ref 4.0–10.5)

## 2018-06-08 LAB — COMPREHENSIVE METABOLIC PANEL
ALBUMIN: 4 g/dL (ref 3.5–5.0)
ALT: 14 U/L (ref 0–44)
ANION GAP: 7 (ref 5–15)
AST: 13 U/L — ABNORMAL LOW (ref 15–41)
Alkaline Phosphatase: 120 U/L (ref 38–126)
BUN: 12 mg/dL (ref 6–20)
CHLORIDE: 101 mmol/L (ref 98–111)
CO2: 29 mmol/L (ref 22–32)
Calcium: 9.1 mg/dL (ref 8.9–10.3)
Creatinine, Ser: 0.84 mg/dL (ref 0.61–1.24)
GFR calc Af Amer: >60 mL/min (ref >60)
GFR calc non Af Amer: >60 mL/min (ref >60)
GLUCOSE: 102 mg/dL — AB (ref 70–99)
POTASSIUM: 4 mmol/L (ref 3.5–5.1)
Sodium: 137 mmol/L (ref 135–145)
Total Bilirubin: 0.5 mg/dL (ref 0.3–1.2)
Total Protein: 7.4 g/dL (ref 6.5–8.1)

## 2018-06-08 LAB — URINALYSIS, ROUTINE W REFLEX MICROSCOPIC
BACTERIA UA: NONE SEEN
Bilirubin Urine: NEGATIVE
Glucose, UA: NEGATIVE mg/dL
KETONES UR: NEGATIVE mg/dL
Leukocytes, UA: NEGATIVE
Nitrite: NEGATIVE
PROTEIN: NEGATIVE mg/dL
Specific Gravity, Urine: 1.012 (ref 1.005–1.030)
pH: 7 (ref 5.0–8.0)

## 2018-06-08 LAB — LIPASE, BLOOD: LIPASE: 3010 U/L — AB (ref 11–51)

## 2018-06-08 LAB — LIPID PANEL
CHOLESTEROL: 205 mg/dL — AB (ref 0–200)
HDL: 33 mg/dL — AB (ref >40)
LDL CALC: 155 mg/dL — AB (ref 0–99)
TRIGLYCERIDES: 85 mg/dL (ref <150)
Total CHOL/HDL Ratio: 6.2 RATIO
VLDL: 17 mg/dL (ref 0–40)

## 2018-06-08 IMAGING — DX DG ABDOMEN ACUTE W/ 1V CHEST
3 series · 3 of 3 positions shown · non-contrast
Comparison: [DATE], CT [DATE]

CLINICAL DATA: Abdominal pain

EXAM:
DG ABDOMEN ACUTE W/ 1V CHEST

[chest pa]
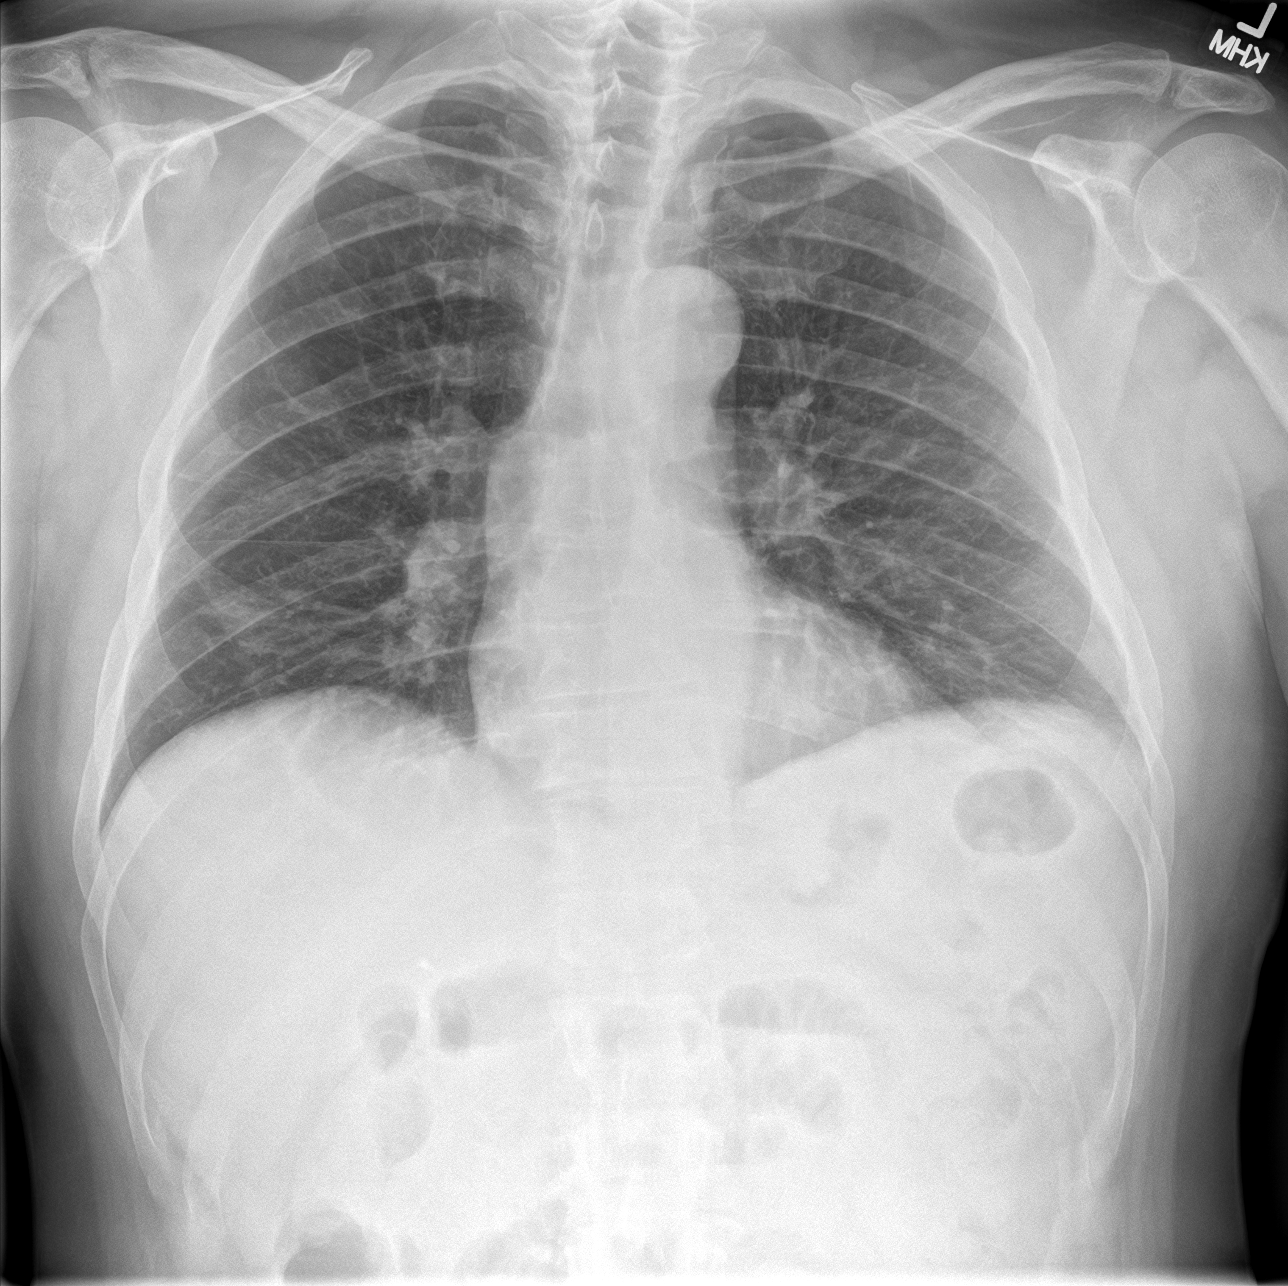

[abdomen erect]
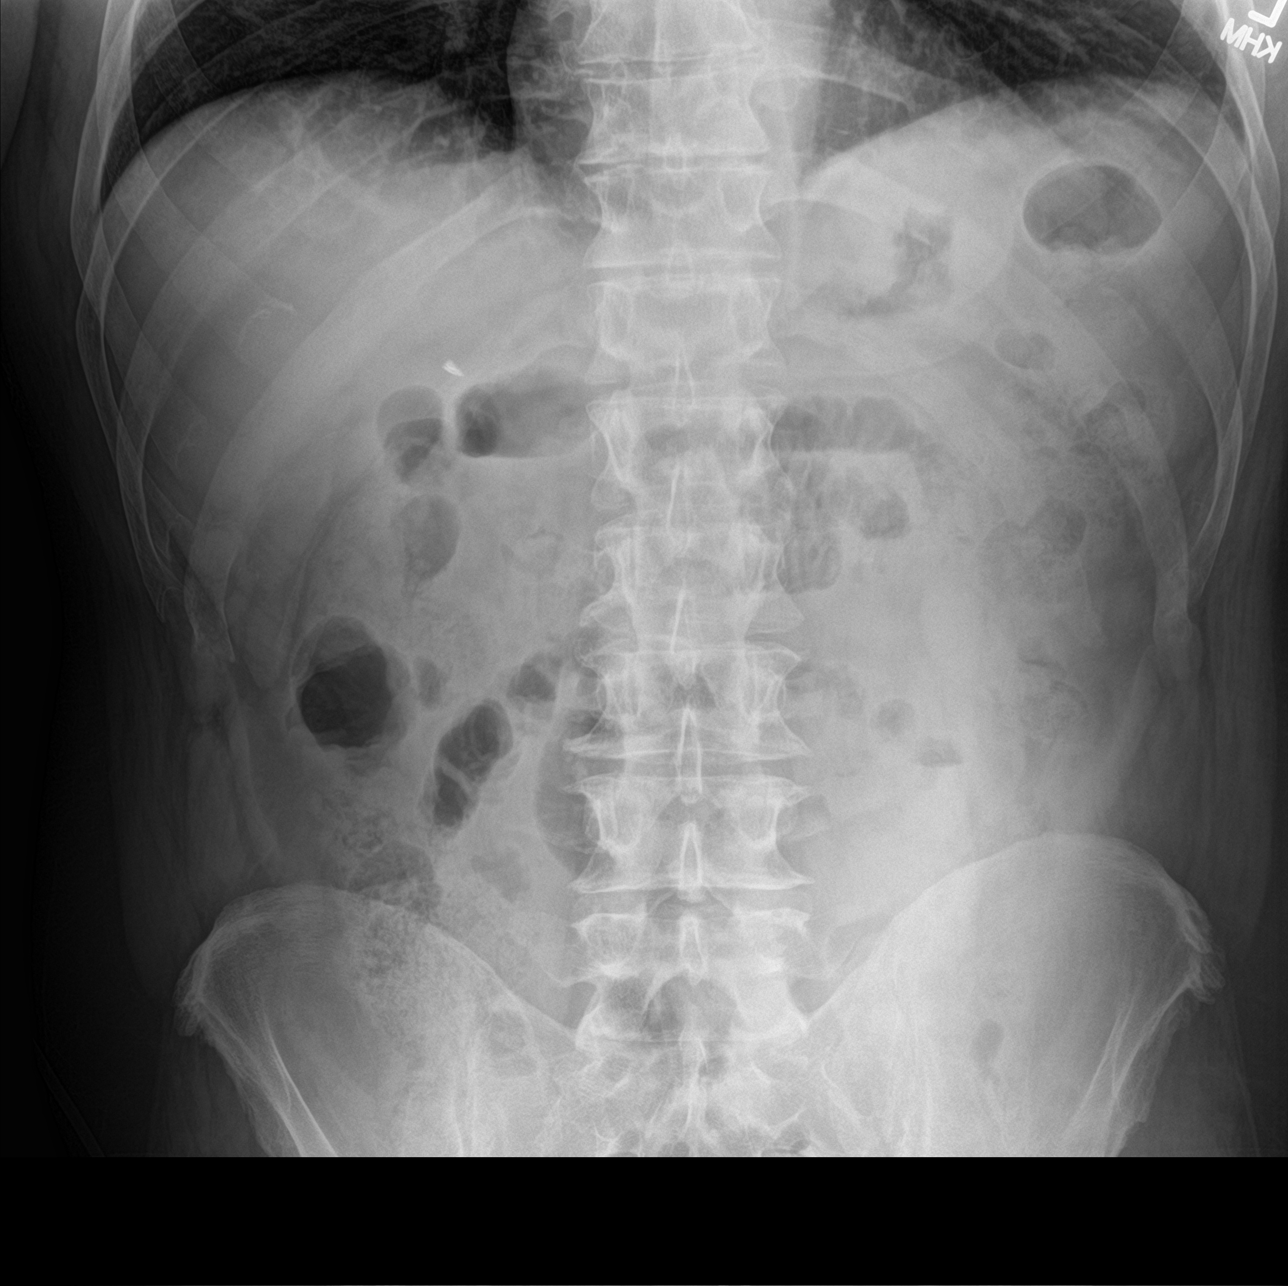

[abdomen supine]
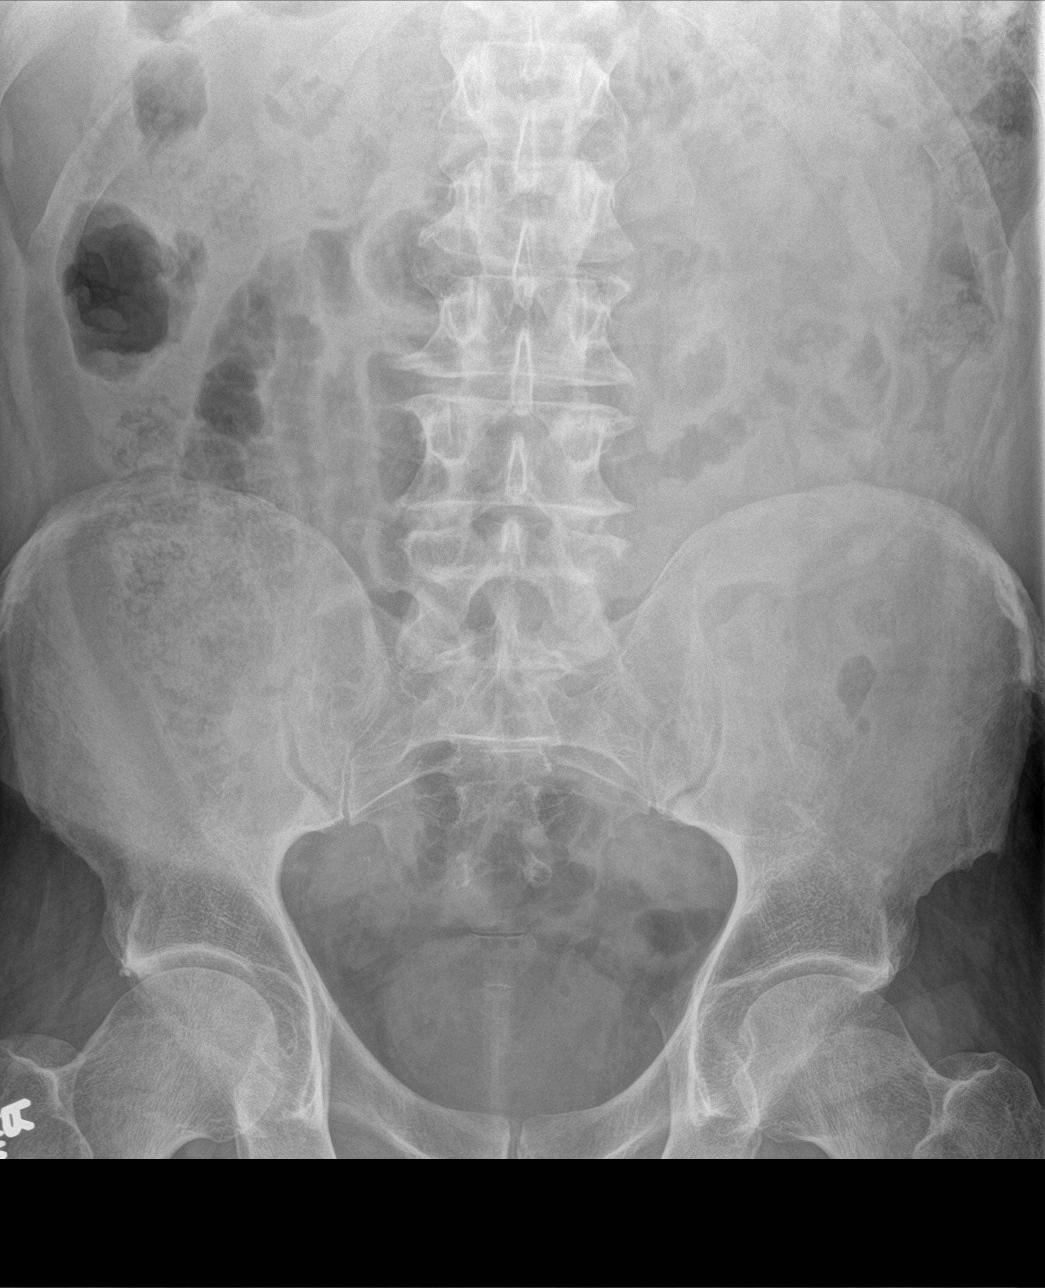

[3 of 3 positions shown; findings below may reference images not displayed]

FINDINGS: Single-view chest demonstrates streaky atelectasis at the bases. No
pleural effusion. Normal heart size.

Supine and upright views of the abdomen demonstrate no free air
beneath the diaphragm. Surgical clips in the right upper quadrant.
Gas filled nonenlarged central small bowel loops with a a few fluid
levels but with colon gas present. Moderate stool in the colon.
IMPRESSION: 1. Minimal basilar atelectasis.
2. Overall nonobstructed gas pattern

## 2018-06-08 MED ORDER — KETOROLAC TROMETHAMINE 30 MG/ML IJ SOLN
30.0000 mg | Freq: Once | INTRAMUSCULAR | Status: AC
  Administered 2018-06-08: 30 mg via INTRAVENOUS
  Filled 2018-06-08: qty 1

## 2018-06-08 MED ORDER — ACETAMINOPHEN 325 MG PO TABS: 650.0000 mg | ORAL_TABLET | Freq: Four times a day (QID) | ORAL | Status: DC | PRN

## 2018-06-08 MED ORDER — LORATADINE 10 MG PO TABS
10.0000 mg | ORAL_TABLET | Freq: Every day | ORAL | Status: DC
  Administered 2018-06-09 – 2018-06-10 (×2): 10 mg via ORAL
  Filled 2018-06-08 (×3): qty 1

## 2018-06-08 MED ORDER — ORAL CARE MOUTH RINSE: 15.0000 mL | Freq: Two times a day (BID) | OROMUCOSAL | Status: DC

## 2018-06-08 MED ORDER — KETOROLAC TROMETHAMINE 30 MG/ML IJ SOLN
30.0000 mg | Freq: Four times a day (QID) | INTRAMUSCULAR | Status: DC | PRN
  Administered 2018-06-09 – 2018-06-10 (×4): 30 mg via INTRAVENOUS
  Filled 2018-06-08 (×4): qty 1

## 2018-06-08 MED ORDER — LISINOPRIL 10 MG PO TABS
20.0000 mg | ORAL_TABLET | Freq: Every day | ORAL | Status: DC
  Administered 2018-06-09: 20 mg via ORAL
  Filled 2018-06-08: qty 2

## 2018-06-08 MED ORDER — ONDANSETRON HCL 4 MG/2ML IJ SOLN
4.0000 mg | Freq: Once | INTRAMUSCULAR | Status: AC
  Administered 2018-06-08: 4 mg via INTRAVENOUS
  Filled 2018-06-08: qty 2

## 2018-06-08 MED ORDER — SERTRALINE HCL 50 MG PO TABS
100.0000 mg | ORAL_TABLET | Freq: Two times a day (BID) | ORAL | Status: DC
  Administered 2018-06-08 – 2018-06-10 (×4): 100 mg via ORAL
  Filled 2018-06-08 (×4): qty 2

## 2018-06-08 MED ORDER — CYCLOBENZAPRINE HCL 10 MG PO TABS: 10.0000 mg | ORAL_TABLET | Freq: Three times a day (TID) | ORAL | Status: DC | PRN

## 2018-06-08 MED ORDER — LACTATED RINGERS IV SOLN
INTRAVENOUS | Status: DC
  Administered 2018-06-08 – 2018-06-10 (×5): via INTRAVENOUS

## 2018-06-08 MED ORDER — CHLORHEXIDINE GLUCONATE 0.12 % MT SOLN
15.0000 mL | Freq: Two times a day (BID) | OROMUCOSAL | Status: DC
  Administered 2018-06-08: 15 mL via OROMUCOSAL
  Filled 2018-06-08 (×3): qty 15

## 2018-06-08 MED ORDER — ONDANSETRON HCL 4 MG/2ML IJ SOLN
4.0000 mg | Freq: Four times a day (QID) | INTRAMUSCULAR | Status: DC | PRN
  Administered 2018-06-09 (×3): 4 mg via INTRAVENOUS
  Filled 2018-06-08 (×3): qty 2

## 2018-06-08 MED ORDER — ENOXAPARIN SODIUM 40 MG/0.4ML ~~LOC~~ SOLN
40.0000 mg | SUBCUTANEOUS | Status: DC
  Administered 2018-06-08 – 2018-06-09 (×2): 40 mg via SUBCUTANEOUS
  Filled 2018-06-08 (×2): qty 0.4

## 2018-06-08 MED ORDER — ACETAMINOPHEN 650 MG RE SUPP: 650.0000 mg | Freq: Four times a day (QID) | RECTAL | Status: DC | PRN

## 2018-06-08 MED ORDER — SODIUM CHLORIDE 0.9 % IV BOLUS
500.0000 mL | Freq: Once | INTRAVENOUS | Status: AC
  Administered 2018-06-08: 500 mL via INTRAVENOUS

## 2018-06-08 MED ORDER — ONDANSETRON HCL 4 MG PO TABS: 4.0000 mg | ORAL_TABLET | Freq: Four times a day (QID) | ORAL | Status: DC | PRN

## 2018-06-08 MED ORDER — ASPIRIN 81 MG PO CHEW
81.0000 mg | CHEWABLE_TABLET | Freq: Every day | ORAL | Status: DC
  Administered 2018-06-08 – 2018-06-10 (×3): 81 mg via ORAL
  Filled 2018-06-08 (×3): qty 1

## 2018-06-08 MED ORDER — NICOTINE 7 MG/24HR TD PT24
7.0000 mg | MEDICATED_PATCH | Freq: Every day | TRANSDERMAL | Status: DC
  Filled 2018-06-08 (×3): qty 1

## 2018-06-08 MED ORDER — LACTATED RINGERS IV BOLUS
2000.0000 mL | Freq: Once | INTRAVENOUS | Status: AC
  Administered 2018-06-08 – 2018-06-09 (×2): 1000 mL via INTRAVENOUS

## 2018-06-08 MED ORDER — NITROGLYCERIN 0.4 MG SL SUBL: 0.4000 mg | SUBLINGUAL_TABLET | SUBLINGUAL | Status: DC | PRN

## 2018-06-08 NOTE — ED Notes (Signed)
ED TO INPATIENT HANDOFF REPORT  Name/Age/Gender Clinton Ross 52 y.o. male  Code Status    Code Status Orders  (From admission, onward)         Start     Ordered   06/08/18 2048  Full code  Continuous     06/08/18 2052        Code Status History    Date Active Date Inactive Code Status Order ID Comments User Context   07/21/2016 2209 07/22/2016 1435 Full Code [id number]  Roney Jaffe, MD Inpatient   11/27/2013 2321 11/29/2013 2009 Full Code [id number]  Phillips Grout, MD Inpatient   11/20/2013 2229 11/23/2013 1720 Full Code [id number]  Bonnielee Haff, MD Inpatient      Home/SNF/Other Home  Chief Complaint Abdominal Pain  Level of Care/Admitting Diagnosis ED Disposition    ED Disposition Condition [place]: [facility] [[id number]]  Level of Care: Med-Surg [16]  Diagnosis: Pancreatitis [030092]  Admitting Physician: Rodena Goldmann [[id number]]  Attending Physician: Rodena Goldmann [[id number]]  Estimated length of stay: past midnight tomorrow  Certification:: I certify this patient will need inpatient services for at least 2 midnights  PT Class (Do Not Modify): Inpatient [101]  PT Acc Code (Do Not Modify): Private [1]       Medical History Past Medical History:  Diagnosis Date  . Acute pancreatitis 10/2013  . Arthritis   . Bulging disc   . Cancer [facility])    Colon  . Chronic knee pain   . Colostomy in place [facility])   . Gallstones   . Hypertension   . PTSD (post-traumatic stress disorder)   . Sciatica     Allergies Allergies  Allergen Reactions  . Peanut-Containing Drug Products Itching    Walnuts  . Morphine And Related     Altered mental status-burning sensation all over    IV Location/Drains/Wounds Patient Lines/Drains/Airways Status   Active Line/Drains/Airways    Name:   Placement date:   Placement time:   Site:   Days:   Peripheral IV 06/08/18 Right Antecubital   06/08/18    1840    Antecubital   less than 1           Labs/Imaging Results for orders placed or performed during the hospital encounter of 06/08/18 (from the past 48 hour(s))  Urinalysis, Routine w reflex microscopic     Status: Abnormal   Collection Time: 06/08/18  5:05 PM  Result Value Ref Range   Color, Urine YELLOW YELLOW   APPearance CLEAR CLEAR   Specific Gravity, Urine 1.012 1.005 - 1.030   pH 7.0 5.0 - 8.0   Glucose, UA NEGATIVE NEGATIVE mg/dL   Hgb urine dipstick MODERATE (A) NEGATIVE   Bilirubin Urine NEGATIVE NEGATIVE   Ketones, ur NEGATIVE NEGATIVE mg/dL   Protein, ur NEGATIVE NEGATIVE mg/dL   Nitrite NEGATIVE NEGATIVE   Leukocytes, UA NEGATIVE NEGATIVE   RBC / HPF 0-5 0 - 5 RBC/hpf   WBC, UA 0-5 0 - 5 WBC/hpf   Bacteria, UA NONE SEEN NONE SEEN    Comment: Performed at [facility], [address] [place], [place] [address]  Lipase, blood     Status: Abnormal   Collection Time: 06/08/18  5:39 PM  Result Value Ref Range   Lipase 3,010 (H) 11 - 51 U/L    Comment: RESULTS CONFIRMED BY MANUAL DILUTION Performed at [facility], [address] [place], [place] [address]   Comprehensive metabolic panel  Status: Abnormal   Collection Time: 06/08/18  5:39 PM  Result Value Ref Range   Sodium 137 135 - 145 mmol/L   Potassium 4.0 3.5 - 5.1 mmol/L   Chloride 101 98 - 111 mmol/L   CO2 29 22 - 32 mmol/L   Glucose, Bld 102 (H) 70 - 99 mg/dL   BUN 12 6 - 20 mg/dL   Creatinine, Ser 0.84 0.61 - 1.24 mg/dL   Calcium 9.1 8.9 - 10.3 mg/dL   Total Protein 7.4 6.5 - 8.1 g/dL   Albumin 4.0 3.5 - 5.0 g/dL   AST 13 (L) 15 - 41 U/L   ALT 14 0 - 44 U/L   Alkaline Phosphatase 120 38 - 126 U/L   Total Bilirubin 0.5 0.3 - 1.2 mg/dL   GFR calc non Af Amer >60 >60 mL/min   GFR calc Af Amer >60 >60 mL/min    Comment: (NOTE) The eGFR has been calculated using the CKD EPI equation. This calculation has not been validated in all clinical situations. eGFR's persistently <60 mL/min signify possible Chronic Kidney Disease.    Anion gap  7 5 - 15    Comment: Performed at [facility], [address], [place], [place] [address]  CBC     Status: Abnormal   Collection Time: 06/08/18  5:39 PM  Result Value Ref Range   WBC 13.0 (H) 4.0 - 10.5 K/uL   RBC 6.09 (H) 4.22 - 5.81 MIL/uL   Hemoglobin 16.9 13.0 - 17.0 g/dL   HCT 50.7 39.0 - 52.0 %   MCV 83.3 78.0 - 100.0 fL   MCH 27.8 26.0 - 34.0 pg   MCHC 33.3 30.0 - 36.0 g/dL   RDW 14.0 11.5 - 15.5 %   Platelets 223 150 - 400 K/uL    Comment: Performed at [facility], [address], [place], [place] [address]   Dg Abdomen Acute W/chest  Result Date: 06/08/2018 CLINICAL DATA:  Abdominal pain EXAM: DG ABDOMEN ACUTE W/ 1V CHEST COMPARISON:  04/25/2018, CT 05/07/2018 FINDINGS: Single-view chest demonstrates streaky atelectasis at the bases. No pleural effusion. Normal heart size. Supine and upright views of the abdomen demonstrate no free air beneath the diaphragm. Surgical clips in the right upper quadrant. Gas filled nonenlarged central small bowel loops with a a few fluid levels but with colon gas present. Moderate stool in the colon. IMPRESSION: 1. Minimal basilar atelectasis. 2. Overall nonobstructed gas pattern Electronically Signed   By: Donavan Foil M.D.   On: 06/08/2018 19:28    Pending Labs Unresulted Labs (From admission, onward)    Start     Ordered   06/15/18 0500  Creatinine, serum  (enoxaparin (LOVENOX)    CrCl >/= 30 ml/min)  Weekly,   R    Comments:  while on enoxaparin therapy    06/08/18 2052   06/09/18 0500  Comprehensive metabolic panel  Tomorrow morning,   R     06/08/18 2052   06/09/18 0500  CBC  Tomorrow morning,   R     06/08/18 2052   06/09/18 0500  Lipase, blood  Tomorrow morning,   R     06/08/18 2052   06/08/18 2052  Lipid panel  Once,   R     06/08/18 2052   06/08/18 2048  HIV antibody (Routine Testing)  Once,   R     06/08/18 2052          Vitals/Pain Today's Vitals   06/08/18 1703 06/08/18 1704  06/08/18 1921  BP:  (!) 142/101 (!) 133/92   Pulse:  78 74  Resp:  12 16  Temp:  97.7 F (36.5 C)   TempSrc:  Oral   SpO2:  97% 96%  Weight: 92.1 kg    Height: '5\' 10"'$  (1.778 m)    PainSc:  8      Isolation Precautions No active isolations  Medications Medications  lactated ringers infusion ( Intravenous New Bag/Given 06/08/18 2013)  aspirin chewable tablet 81 mg (has no administration in time range)  lisinopril (PRINIVIL,ZESTRIL) tablet 20 mg (has no administration in time range)  nitroGLYCERIN (NITROSTAT) SL tablet 0.4 mg (has no administration in time range)  sertraline (ZOLOFT) tablet 100 mg (has no administration in time range)  cyclobenzaprine (FLEXERIL) tablet 10 mg (has no administration in time range)  loratadine (CLARITIN) tablet 10 mg (has no administration in time range)  enoxaparin (LOVENOX) injection 40 mg (has no administration in time range)  acetaminophen (TYLENOL) tablet 650 mg (has no administration in time range)    Or  acetaminophen (TYLENOL) suppository 650 mg (has no administration in time range)  ondansetron (ZOFRAN) tablet 4 mg (has no administration in time range)    Or  ondansetron (ZOFRAN) injection 4 mg (has no administration in time range)  ketorolac (TORADOL) 30 MG/ML injection 30 mg (has no administration in time range)  lactated ringers bolus 2,000 mL (has no administration in time range)  nicotine (NICODERM CQ - dosed in mg/24 hr) patch 7 mg (has no administration in time range)  sodium chloride 0.9 % bolus 500 mL ( Intravenous Stopped 06/08/18 1947)  ondansetron (ZOFRAN) injection 4 mg (4 mg Intravenous Given 06/08/18 1839)  ketorolac (TORADOL) 30 MG/ML injection 30 mg (30 mg Intravenous Given 06/08/18 2014)    Mobility walks

## 2018-06-08 NOTE — ED Triage Notes (Signed)
Patient complains of abdominal pain with nausea. Denies diarrhea, black stools.

## 2018-06-08 NOTE — ED Notes (Signed)
Attempted to call report, nurse unavailable.

## 2018-06-08 NOTE — ED Provider Notes (Signed)
Emergency Department Provider Note   I have reviewed the triage vital signs and the nursing notes.   HISTORY  Chief Complaint Abdominal Pain   HPI Clinton Ross is a 52 y.o. male with PMH of colostomy s/p colon cancer resection, pancreatitis, HTN, and PTSD presents to the emergency department with abdominal pain and nausea.  The patient states that he has had pain over the past several days which is worsened.  Denies any fevers or chills.  He did have some constipation but took some stool softener and laxative 2 days ago which increased his ostomy output.  No blood has been seen in the stool.  Patient is not having vomiting but is feeling very nauseated especially with eating.  He is having moderate to severe mid abdominal pain near the scar from a prior surgery.  Denies any chest pain or shortness of breath.  He has been taking over-the-counter medications at home with no relief.   Past Medical History:  Diagnosis Date  . Acute pancreatitis 10/2013  . Arthritis   . Bulging disc   . Cancer [facility])    Colon  . Chronic knee pain   . Colostomy in place [facility])   . Gallstones   . Hypertension   . PTSD (post-traumatic stress disorder)   . Sciatica     Patient Active Problem List   Diagnosis Date Noted  . Chest pain 07/21/2016  . Cholelithiasis 11/28/2013  . Tobacco abuse 11/28/2013  . Abdominal pain 11/27/2013  . Acute pancreatitis 11/20/2013  . Constipation 11/20/2013  . HTN (hypertension), benign 11/20/2013  . PTSD (post-traumatic stress disorder) 11/20/2013  . Sciatica 05/24/2009  . DERANGEMENT MENISCUS 10/24/2008  . JOINT EFFUSION, KNEE 10/24/2008  . KNEE PAIN 10/24/2008    Past Surgical History:  Procedure Laterality Date  . CHOLECYSTECTOMY    . COLON SURGERY    . rectal cancer Right     Allergies Peanut-containing drug products and Morphine and related  Family History  Problem Relation Age of Onset  . Hypertension Mother   . Stroke Father   . Diabetes Other      Social History Social History   Tobacco Use  . Smoking status: Current Every Day Smoker    Packs/day: 0.50    Years: 30.00    Pack years: 15.00    Types: Cigarettes  . Smokeless tobacco: Never Used  Substance Use Topics  . Alcohol use: No  . Drug use: No    Review of Systems  Constitutional: No fever/chills Eyes: No visual changes. ENT: No sore throat. Cardiovascular: Denies chest pain. Respiratory: Denies shortness of breath. Gastrointestinal: Positive abdominal pain. Positive nausea, no vomiting.  No diarrhea.  No constipation. Genitourinary: Negative for dysuria. Musculoskeletal: Negative for back pain. Skin: Negative for rash. Neurological: Negative for headaches, focal weakness or numbness.  10-point ROS otherwise negative.  ____________________________________________   PHYSICAL EXAM:  VITAL SIGNS: ED Triage Vitals  Enc Vitals Group     BP 06/08/18 1704 (!) 142/101     Pulse Rate 06/08/18 1704 78     Resp 06/08/18 1704 12     Temp 06/08/18 1704 97.7 F (36.5 C)     Temp Source 06/08/18 1704 Oral     SpO2 06/08/18 1704 97 %     Weight 06/08/18 1703 203 lb (92.1 kg)     Height 06/08/18 1703 5\' 10"  (1.778 m)     Pain Score 06/08/18 1704 8   Constitutional: Alert and oriented. Well appearing and  in no acute distress. Eyes: Conjunctivae are normal. Head: Atraumatic. Nose: No congestion/rhinnorhea. Mouth/Throat: Mucous membranes are moist.  Oropharynx non-erythematous. Neck: No stridor.  Cardiovascular: Normal rate, regular rhythm. Good peripheral circulation. Grossly normal heart sounds.   Respiratory: Normal respiratory effort.  No retractions. Lungs CTAB. Gastrointestinal: Soft with focal midline tenderness. No palpable hernia. No overlying skin changes. Output visualized in the ostomy. No distention.  Musculoskeletal: No lower extremity tenderness nor edema. No gross deformities of extremities. Neurologic:  Normal speech and language. No gross  focal neurologic deficits are appreciated.  Skin:  Skin is warm, dry and intact. No rash noted.  ____________________________________________   LABS (all labs ordered are listed, but only abnormal results are displayed)  Labs Reviewed  LIPASE, BLOOD - Abnormal; Notable for the following components:      Result Value   Lipase 3,010 (*)    All other components within normal limits  COMPREHENSIVE METABOLIC PANEL - Abnormal; Notable for the following components:   Glucose, Bld 102 (*)    AST 13 (*)    All other components within normal limits  CBC - Abnormal; Notable for the following components:   WBC 13.0 (*)    RBC 6.09 (*)    All other components within normal limits  URINALYSIS, ROUTINE W REFLEX MICROSCOPIC   ____________________________________________  RADIOLOGY  Dg Abdomen Acute W/chest  Result Date: 06/08/2018 CLINICAL DATA:  Abdominal pain EXAM: DG ABDOMEN ACUTE W/ 1V CHEST COMPARISON:  04/25/2018, CT 05/07/2018 FINDINGS: Single-view chest demonstrates streaky atelectasis at the bases. No pleural effusion. Normal heart size. Supine and upright views of the abdomen demonstrate no free air beneath the diaphragm. Surgical clips in the right upper quadrant. Gas filled nonenlarged central small bowel loops with a a few fluid levels but with colon gas present. Moderate stool in the colon. IMPRESSION: 1. Minimal basilar atelectasis. 2. Overall nonobstructed gas pattern Electronically Signed   By: Donavan Foil M.D.   On: 06/08/2018 19:28    ____________________________________________   PROCEDURES  Procedure(s) performed:   Procedures  None ____________________________________________   INITIAL IMPRESSION / ASSESSMENT AND PLAN / ED COURSE  Pertinent labs & imaging results that were available during my care of the patient were reviewed by me and considered in my medical decision making (see chart for details).  Urgency department with abdominal pain and nausea.  He has  a story of bowel resection in the setting of colon cancer and has an ostomy in place.  There is good output into the ostomy but patient experiencing nausea and midline pain.  He had a CT scan last month showed a fat-containing hernia without obstruction.  Low suspicion for obstruction at this time but plan for plain film of the abdomen to assess bowel gas pattern. Labs, including lipase, are pending. Plan for IVF and Zofran. Patient with constipation issues and listed allergy to morphine so will avoid morphine.   Plain film reviewed without acute findings. Patient's lipase > 3,000. No fever but does have leukocytosis. Plan for IVF, Toradol, and bowel rest. Patient does not want opiates 2/2 AMS and uncomfortable burning sensation so will avoid.   Discussed patient's case with Hospitalist, Dr. Manuella Ghazi to request admission. Patient and family (if present) updated with plan. Care transferred to Hospitalist service.  I reviewed all nursing notes, vitals, pertinent old records, labs, imaging (as available).  ____________________________________________  FINAL CLINICAL IMPRESSION(S) / ED DIAGNOSES  Final diagnoses:  Acute pancreatitis, unspecified complication status, unspecified pancreatitis type    MEDICATIONS  GIVEN DURING THIS VISIT:  Medications  lactated ringers infusion ( Intravenous New Bag/Given 06/08/18 2013)  sodium chloride 0.9 % bolus 500 mL ( Intravenous Stopped 06/08/18 1947)  ondansetron (ZOFRAN) injection 4 mg (4 mg Intravenous Given 06/08/18 1839)  ketorolac (TORADOL) 30 MG/ML injection 30 mg (30 mg Intravenous Given 06/08/18 2014)    Note:  This document was prepared using Dragon voice recognition software and may include unintentional dictation errors.  Nanda Quinton, MD Emergency Medicine    Long, Wonda [facility], MD 06/08/18 2019

## 2018-06-08 NOTE — ED Notes (Signed)
Patient transported to X-ray 

## 2018-06-08 NOTE — H&P (Signed)
History and Physical    STACY DESHLER KDT:[id number] DOB: 1966-05-03 DOA: 06/08/2018  PCP: Toney Rakes, MD   Patient coming from: Home  Chief Complaint: Epigastric abdominal pain  HPI: Clinton Ross is a 52 y.o. male with medical history significant for colon cancer with prior resection and colostomy 2 years ago, prior pancreatitis, prior cholecystectomy, hypertension, PTSD, and ongoing tobacco abuse who presented to the ED with sudden onset, sharp, epigastric pain that began earlier this morning as he was in his dentist office.  He states that the pain progressively worsened and was persistent.  This was reminiscent of his prior pancreatitis pain and therefore, he came to the ED for further evaluation.  He states he had had some mild nausea but no emesis and denies any fevers or chills.  He denies any changes to his bowel movements aside from some minor constipation over 2 days ago.  No blood in his stool noted.  He has been trying to take over-the-counter medications at home with no relief and denies any alcohol use in the last 10 years.  He does state that over this past few days he had some fatty foods which he usually tries to avoid.  He states that he has had a hamburger, some sausage, along with fried shrimp and hush puppies just yesterday evening.   ED Course: Vital signs are stable and patient is afebrile.  Laboratory data with some mild leukocytosis of 13,000 and glucose is 102.  Lipase is markedly elevated at 3010.  Abdominal and chest films with minimal atelectasis at the lung bases, but with otherwise normal bowel gas pattern present.  Patient has been started on a 500 LR fluid bolus as well as infusion and has been given some Toradol as well as Zofran for symptomatic relief.  Review of Systems: All others reviewed and otherwise negative.  Past Medical History:  Diagnosis Date  . Acute pancreatitis 10/2013  . Arthritis   . Bulging disc   . Cancer [facility])    Colon  . Chronic knee  pain   . Colostomy in place [facility])   . Gallstones   . Hypertension   . PTSD (post-traumatic stress disorder)   . Sciatica     Past Surgical History:  Procedure Laterality Date  . CHOLECYSTECTOMY    . COLON SURGERY    . rectal cancer Right      reports that he has been smoking cigarettes. He has a 15.00 pack-year smoking history. He has never used smokeless tobacco. He reports that he does not drink alcohol or use drugs.  Allergies  Allergen Reactions  . Peanut-Containing Drug Products Itching    Walnuts  . Morphine And Related     Altered mental status-burning sensation all over    Family History  Problem Relation Age of Onset  . Hypertension Mother   . Stroke Father   . Diabetes Other     Prior to Admission medications   Medication Sig Start Date End Date Taking? Authorizing Provider  aspirin 81 MG chewable tablet Chew 1 tablet (81 mg total) by mouth daily. 02/23/18   Julianne Rice, MD  cetirizine (ZYRTEC) 10 MG tablet Take 10 mg by mouth daily.    [provider]  cyclobenzaprine (FLEXERIL) 10 MG tablet Take 10 mg by mouth 3 (three) times daily as needed for muscle spasms.    [provider]  HYDROcodone-acetaminophen (NORCO/VICODIN) 5-325 MG tablet Take 1 tablet by mouth 3 (three) times daily as needed for moderate  pain.    [provider]  lisinopril (PRINIVIL,ZESTRIL) 40 MG tablet Take 20 mg by mouth daily.    [provider]  nitroGLYCERIN (NITROSTAT) 0.4 MG SL tablet Place 1 tablet (0.4 mg total) under the tongue every 5 (five) minutes as needed for chest pain. 02/23/18   Julianne Rice, MD  sertraline (ZOLOFT) 100 MG tablet Take 100 mg by mouth 2 (two) times daily.    [provider]    Physical Exam: Vitals:   06/08/18 1703 06/08/18 1704 06/08/18 1921  BP:  (!) 142/101 (!) 133/92  Pulse:  78 74  Resp:  12 16  Temp:  97.7 F (36.5 C)   TempSrc:  Oral   SpO2:  97% 96%  Weight: 92.1 kg    Height: 5\' 10"  (1.778 m)       Constitutional: NAD, calm, comfortable Vitals:   06/08/18 1703 06/08/18 1704 06/08/18 1921  BP:  (!) 142/101 (!) 133/92  Pulse:  78 74  Resp:  12 16  Temp:  97.7 F (36.5 C)   TempSrc:  Oral   SpO2:  97% 96%  Weight: 92.1 kg    Height: 5\' 10"  (1.778 m)     Eyes: lids and conjunctivae normal ENMT: Mucous membranes are moist.  Neck: normal, supple Respiratory: clear to auscultation bilaterally. Normal respiratory effort. No accessory muscle use.  Cardiovascular: Regular rate and rhythm, no murmurs. No extremity edema. Abdomen: Mild to moderate abdominal tenderness to palpation over epigastric region.  Colostomy bag present with no stool, but with pink stoma.  Midline abdominal incision present from prior colectomy with midline hernia. Musculoskeletal:  No joint deformity upper and lower extremities.   Skin: no rashes, lesions, ulcers.  Psychiatric: Normal judgment and insight. Alert and oriented x 3. Normal mood.   Labs on Admission: I have personally reviewed following labs and imaging studies  CBC: Recent Labs  Lab 06/08/18 1739  WBC 13.0*  HGB 16.9  HCT 50.7  MCV 83.3  PLT 154   Basic Metabolic Panel: Recent Labs  Lab 06/08/18 1739  NA 137  K 4.0  CL 101  CO2 29  GLUCOSE 102*  BUN 12  CREATININE 0.84  CALCIUM 9.1   GFR: Estimated Creatinine Clearance: 114.6 mL/min (by C-G formula based on SCr of 0.84 mg/dL). Liver Function Tests: Recent Labs  Lab 06/08/18 1739  AST 13*  ALT 14  ALKPHOS 120  BILITOT 0.5  PROT 7.4  ALBUMIN 4.0   Recent Labs  Lab 06/08/18 1739  LIPASE 3,010*   No results for input(s): AMMONIA in the last 168 hours. Coagulation Profile: No results for input(s): INR, PROTIME in the last 168 hours. Cardiac Enzymes: No results for input(s): CKTOTAL, CKMB, CKMBINDEX, TROPONINI in the last 168 hours. BNP (last 3 results) No results for input(s): PROBNP in the last 8760 hours. HbA1C: No results for input(s): HGBA1C in the last  72 hours. CBG: No results for input(s): GLUCAP in the last 168 hours. Lipid Profile: No results for input(s): CHOL, HDL, LDLCALC, TRIG, CHOLHDL, LDLDIRECT in the last 72 hours. Thyroid Function Tests: No results for input(s): TSH, T4TOTAL, FREET4, T3FREE, THYROIDAB in the last 72 hours. Anemia Panel: No results for input(s): VITAMINB12, FOLATE, FERRITIN, TIBC, IRON, RETICCTPCT in the last 72 hours. Urine analysis:    Component Value Date/Time   COLORURINE YELLOW 06/08/2018 [place] 06/08/2018 1705   LABSPEC 1.012 06/08/2018 1705   PHURINE 7.0 06/08/2018 1705   GLUCOSEU NEGATIVE 06/08/2018 1705  HGBUR MODERATE (A) 06/08/2018 1705   BILIRUBINUR NEGATIVE 06/08/2018 1705   KETONESUR NEGATIVE 06/08/2018 1705   PROTEINUR NEGATIVE 06/08/2018 1705   UROBILINOGEN 0.2 12/04/2013 1635   NITRITE NEGATIVE 06/08/2018 1705   LEUKOCYTESUR NEGATIVE 06/08/2018 1705    Radiological Exams on Admission: Dg Abdomen Acute W/chest  Result Date: 06/08/2018 CLINICAL DATA:  Abdominal pain EXAM: DG ABDOMEN ACUTE W/ 1V CHEST COMPARISON:  04/25/2018, CT 05/07/2018 FINDINGS: Single-view chest demonstrates streaky atelectasis at the bases. No pleural effusion. Normal heart size. Supine and upright views of the abdomen demonstrate no free air beneath the diaphragm. Surgical clips in the right upper quadrant. Gas filled nonenlarged central small bowel loops with a a few fluid levels but with colon gas present. Moderate stool in the colon. IMPRESSION: 1. Minimal basilar atelectasis. 2. Overall nonobstructed gas pattern Electronically Signed   By: Donavan Foil M.D.   On: 06/08/2018 19:28     Assessment/Plan Principal Problem:   Acute pancreatitis Active Problems:   HTN (hypertension), benign   PTSD (post-traumatic stress disorder)   Tobacco abuse    1. Acute, recurrent pancreatitis.  Appears to be related to fatty diet over the last few days to week.  Will check lipid panel.  Patient has had  prior cholecystectomy and denies alcohol use.  Plan to maintain on aggressive IV fluid hydration with another 2 L fluid bolus.  Pain management with Toradol every 6 hours as needed.  Pain apparently is well controlled and patient declines narcotics as this tends to aggravate him.  Zofran as needed for any nausea or vomiting.  Recheck CMP as well as lipase in a.m. 2. Hypertension.  Well-controlled at this time.  Continue home lisinopril. 3. PTSD.  Continue Zoloft. 4. Tobacco abuse.  Nicotine patch.  Counseled on cessation.   DVT prophylaxis: Lovenox Code Status: Full Family Communication: None at bedside Disposition Plan:Treatment of pancreatitis Consults called:None Admission status: Inpatient, MedSurg   Lurleen Soltero Darleen Crocker DO [facility] Pager [phone]  If 7PM-7AM, please contact night-coverage [email or web address] Password [email or web address]  06/08/2018, 8:30 PM

## 2018-06-09 ENCOUNTER — Inpatient Hospital Stay (HOSPITAL_COMMUNITY): Payer: No Typology Code available for payment source

## 2018-06-09 DIAGNOSIS — I10 Essential (primary) hypertension: Secondary | ICD-10-CM

## 2018-06-09 DIAGNOSIS — F4310 Post-traumatic stress disorder, unspecified: Secondary | ICD-10-CM

## 2018-06-09 DIAGNOSIS — Z720 Tobacco use: Secondary | ICD-10-CM

## 2018-06-09 DIAGNOSIS — K859 Acute pancreatitis without necrosis or infection, unspecified: Principal | ICD-10-CM

## 2018-06-09 LAB — COMPREHENSIVE METABOLIC PANEL
ALBUMIN: 3.1 g/dL — AB (ref 3.5–5.0)
ALK PHOS: 107 U/L (ref 38–126)
ALT: 13 U/L (ref 0–44)
ANION GAP: 6 (ref 5–15)
AST: 12 U/L — ABNORMAL LOW (ref 15–41)
BUN: 11 mg/dL (ref 6–20)
CHLORIDE: 106 mmol/L (ref 98–111)
CO2: 25 mmol/L (ref 22–32)
Calcium: 8.4 mg/dL — ABNORMAL LOW (ref 8.9–10.3)
Creatinine, Ser: 0.74 mg/dL (ref 0.61–1.24)
GFR calc Af Amer: >60 mL/min (ref >60)
GFR calc non Af Amer: >60 mL/min (ref >60)
GLUCOSE: 89 mg/dL (ref 70–99)
POTASSIUM: 4 mmol/L (ref 3.5–5.1)
SODIUM: 137 mmol/L (ref 135–145)
Total Bilirubin: 0.6 mg/dL (ref 0.3–1.2)
Total Protein: 6 g/dL — ABNORMAL LOW (ref 6.5–8.1)

## 2018-06-09 LAB — CBC
HEMATOCRIT: 45.3 % (ref 39.0–52.0)
HEMOGLOBIN: 14.7 g/dL (ref 13.0–17.0)
MCH: 27.1 pg (ref 26.0–34.0)
MCHC: 32.5 g/dL (ref 30.0–36.0)
MCV: 83.6 fL (ref 78.0–100.0)
Platelets: 190 10*3/uL (ref 150–400)
RBC: 5.42 MIL/uL (ref 4.22–5.81)
RDW: 13.9 % (ref 11.5–15.5)
WBC: 8.9 10*3/uL (ref 4.0–10.5)

## 2018-06-09 LAB — LIPASE, BLOOD: Lipase: 1227 U/L — ABNORMAL HIGH (ref 11–51)

## 2018-06-09 MED ORDER — HYDROCODONE-ACETAMINOPHEN 5-325 MG PO TABS
1.0000 | ORAL_TABLET | Freq: Four times a day (QID) | ORAL | Status: DC | PRN
  Administered 2018-06-09 (×2): 1 via ORAL
  Filled 2018-06-09 (×2): qty 1

## 2018-06-09 MED ORDER — AMLODIPINE BESYLATE 5 MG PO TABS
5.0000 mg | ORAL_TABLET | Freq: Every day | ORAL | Status: DC
  Administered 2018-06-10: 5 mg via ORAL
  Filled 2018-06-09: qty 1

## 2018-06-09 NOTE — Progress Notes (Signed)
PROGRESS NOTE  Clinton Ross SWN:[id number] DOB: [birth year] DOA: 06/08/2018 PCP: Toney Rakes, MD  Brief History:  52 year old male with a history of colon cancer diagnosed 2017 status post chemoradiation and partial colectomy, gallstone pancreatitis, hypertension, PTSD presenting with acute onset of abdominal pain that began on the evening of 06/07/2018.  The patient ate breakfast on the morning of 06/08/2018 and went to see his dentist.  The patient continued to have nausea and worsening pain.  As result, the patient presented for further evaluation.  The patient denies any alcohol use or illegal drug use.  He denies any new medications.  He had some nausea without emesis.  He denies fevers, chills, chest pain or shortness breath, hematemesis, hematochezia, melena.  Upon presentation, BMP, LFTs, and CBC were essentially unremarkable except for WBC 13.0.  Lipase was 3010.  Patient was admitted for acute pancreatitis. Patient is on disability, has PTSD, was in the [employer], Fought in the [employer] in [place]. Lives with his wife in [place]. Grew up in [place].  Assessment/Plan: Acute pancreatitis -Etiology unclear -Lisinopril has been associated with pancreatitis -Hold lisinopril -Right upper quadrant ultrasound -Urine drug screen -Triglycerides 85 -Continue IV fluids -Start clear liquid diet  Essential hypertension -Holding lisinopril -start amlodipine  Tobacco abuse -I have discussed tobacco cessation with the patient.  I have counseled the patient regarding the negative impacts of continued tobacco use including but not limited to lung cancer, COPD, and cardiovascular disease.  I have discussed alternatives to tobacco and modalities that may help facilitate tobacco cessation including but not limited to biofeedback, hypnosis, and medications.  Total time spent with tobacco counseling was 4 minutes. -nicoderm patch  History of colon cancer -Diagnosed in July 2017,  received chemotherapy.  Chronic back pain -continue home dose norco    Disposition Plan:   Home in 1-2 days  Family Communication:  No Family at bedside  Consultants:  none  Code Status:  FULL   DVT Prophylaxis:  [place] Heparin / [place] Lovenox   Procedures: As Listed in Progress Note Above  Antibiotics: None    Subjective: Patient states that his abdominal pain is 50% better.  He denies any vomiting but has some nausea.  He denies any headache, chest pain, shortness breath, diarrhea, hematochezia, melena.  He wants to drink some coffee.  Objective: Vitals:   06/08/18 1921 06/08/18 2142 06/08/18 2259 06/09/18 0605  BP: (!) 133/92 (!) 134/93  (!) 151/92  Pulse: 74 70  67  Resp: 16 18  18   Temp:  98.3 F (36.8 C)  97.8 F (36.6 C)  TempSrc:  Oral  Oral  SpO2: 96% 97% 97% 95%  Weight:      Height:        Intake/Output Summary (Last 24 hours) at 06/09/2018 1040 Last data filed at 06/09/2018 0700 Gross per 24 hour  Intake 1323.73 ml  Output 400 ml  Net 923.73 ml   Weight change:  Exam:   General:  Pt is alert, follows commands appropriately, not in acute distress  HEENT: No icterus, No thrush, No neck mass, [place]/AT  Cardiovascular: RRR, S1/S2, no rubs, no gallops  Respiratory: Bibasilar rales but no wheezing.  Good air movement.  Abdomen: Soft/+BS, epigastric tender, non distended, no guarding  Extremities: No edema, No lymphangitis, No petechiae, No rashes, no synovitis   Data Reviewed: I have personally reviewed following labs and imaging studies Basic Metabolic Panel: Recent Labs  Lab 06/08/18  1739 06/09/18 0528  NA 137 137  K 4.0 4.0  CL 101 106  CO2 29 25  GLUCOSE 102* 89  BUN 12 11  CREATININE 0.84 0.74  CALCIUM 9.1 8.4*   Liver Function Tests: Recent Labs  Lab 06/08/18 1739 06/09/18 0528  AST 13* 12*  ALT 14 13  ALKPHOS 120 107  BILITOT 0.5 0.6  PROT 7.4 6.0*  ALBUMIN 4.0 3.1*   Recent Labs  Lab 06/08/18 1739 06/09/18 0528    LIPASE 3,010* 1,227*   No results for input(s): AMMONIA in the last 168 hours. Coagulation Profile: No results for input(s): INR, PROTIME in the last 168 hours. CBC: Recent Labs  Lab 06/08/18 1739 06/09/18 0528  WBC 13.0* 8.9  HGB 16.9 14.7  HCT 50.7 45.3  MCV 83.3 83.6  PLT 223 190   Cardiac Enzymes: No results for input(s): CKTOTAL, CKMB, CKMBINDEX, TROPONINI in the last 168 hours. BNP: Invalid input(s): POCBNP CBG: No results for input(s): GLUCAP in the last 168 hours. HbA1C: No results for input(s): HGBA1C in the last 72 hours. Urine analysis:    Component Value Date/Time   COLORURINE YELLOW 06/08/2018 [place] 06/08/2018 1705   LABSPEC 1.012 06/08/2018 1705   PHURINE 7.0 06/08/2018 1705   GLUCOSEU NEGATIVE 06/08/2018 1705   HGBUR MODERATE (A) 06/08/2018 1705   BILIRUBINUR NEGATIVE 06/08/2018 1705   KETONESUR NEGATIVE 06/08/2018 1705   PROTEINUR NEGATIVE 06/08/2018 1705   UROBILINOGEN 0.2 12/04/2013 1635   NITRITE NEGATIVE 06/08/2018 1705   LEUKOCYTESUR NEGATIVE 06/08/2018 1705   Sepsis Labs: @LABRCNTIP (procalcitonin:4,lacticidven:4) )No results found for this or any previous visit (from the past 240 hour(s)).   Scheduled Meds: . aspirin  81 mg Oral Daily  . chlorhexidine  15 mL Mouth Rinse BID  . enoxaparin (LOVENOX) injection  40 mg Subcutaneous Q24H  . lisinopril  20 mg Oral Daily  . loratadine  10 mg Oral Daily  . mouth rinse  15 mL Mouth Rinse q12n4p  . nicotine  7 mg Transdermal Daily  . sertraline  100 mg Oral BID   Continuous Infusions: . lactated ringers 125 mL/hr at 06/09/18 9150    Procedures/Studies: Dg Abdomen Acute W/chest  Result Date: 06/08/2018 CLINICAL DATA:  Abdominal pain EXAM: DG ABDOMEN ACUTE W/ 1V CHEST COMPARISON:  04/25/2018, CT 05/07/2018 FINDINGS: Single-view chest demonstrates streaky atelectasis at the bases. No pleural effusion. Normal heart size. Supine and upright views of the abdomen demonstrate no  free air beneath the diaphragm. Surgical clips in the right upper quadrant. Gas filled nonenlarged central small bowel loops with a a few fluid levels but with colon gas present. Moderate stool in the colon. IMPRESSION: 1. Minimal basilar atelectasis. 2. Overall nonobstructed gas pattern Electronically Signed   By: Donavan Foil M.D.   On: 06/08/2018 19:28    Orson Eva, DO  [facility] Pager [phone]  If 7PM-7AM, please contact night-coverage [email or web address] Password [email or web address] 06/09/2018, 10:40 AM   LOS: 1 day

## 2018-06-09 NOTE — Care Management Note (Signed)
Case Management Note  Patient Details  Name: Clinton Ross MRN: [id number] Date of Birth: 05/01/1966  Subjective/Objective:     Admitted with pancreatitis. Pt from home, ind pta. Pt is veteran 100% service connected. He goes to the [facility] and [facility] team 1). CM has notified [facility] transfer center, they did not request a refusal to transfer form. They requested clinicals which were routed. They will follow and cont to be updated on DC plan and pt's needs.      Traci and/or Maudie Mercury  (p) [phone] ext [phone]            Expected Discharge Date:    06/12/18              Expected Discharge Plan:  Home/Self Care  In-House Referral:  NA  Discharge planning Services  CM Consult  Post Acute Care Choice:  NA Choice offered to:  NA  Status of Service:  In process, will continue to follow  Sherald Barge, RN 06/09/2018, 11:36 AM

## 2018-06-10 LAB — BASIC METABOLIC PANEL
Anion gap: 7 (ref 5–15)
BUN: 9 mg/dL (ref 6–20)
CALCIUM: 8.7 mg/dL — AB (ref 8.9–10.3)
CHLORIDE: 102 mmol/L (ref 98–111)
CO2: 27 mmol/L (ref 22–32)
Creatinine, Ser: 0.8 mg/dL (ref 0.61–1.24)
GFR calc non Af Amer: >60 mL/min (ref >60)
GLUCOSE: 100 mg/dL — AB (ref 70–99)
Potassium: 3.9 mmol/L (ref 3.5–5.1)
Sodium: 136 mmol/L (ref 135–145)

## 2018-06-10 LAB — CBC
HCT: 46.1 % (ref 39.0–52.0)
Hemoglobin: 15.4 g/dL (ref 13.0–17.0)
MCH: 27.6 pg (ref 26.0–34.0)
MCHC: 33.4 g/dL (ref 30.0–36.0)
MCV: 82.6 fL (ref 78.0–100.0)
Platelets: 195 10*3/uL (ref 150–400)
RBC: 5.58 MIL/uL (ref 4.22–5.81)
RDW: 13.7 % (ref 11.5–15.5)
WBC: 6.6 10*3/uL (ref 4.0–10.5)

## 2018-06-10 LAB — LIPASE, BLOOD: LIPASE: 152 U/L — AB (ref 11–51)

## 2018-06-10 LAB — HIV ANTIBODY (ROUTINE TESTING W REFLEX): HIV Screen 4th Generation wRfx: NONREACTIVE

## 2018-06-10 MED ORDER — AMLODIPINE BESYLATE 5 MG PO TABS: 5.0000 mg | ORAL_TABLET | Freq: Every day | ORAL | 0 refills | Status: DC

## 2018-06-10 NOTE — Progress Notes (Signed)
Patient is to be discharged home and in stable condition. Patient's IV removed, WNL. Patient given discharge instructions and verbalized understanding. Patient denies the need for a wheelchair escort out, requesting to ambulate.  Celestia Khat, RN

## 2018-06-10 NOTE — Discharge Summary (Signed)
Physician Discharge Summary  Clinton Ross STM:[id number] DOB: December 06, 1965 DOA: 06/08/2018  PCP: Toney Rakes, MD  Admit date: 06/08/2018 Discharge date: 06/10/2018  Admitted From: home Disposition:  home  Recommendations for Outpatient Follow-up:  1. Follow up with PCP in 1-2 weeks 2. Patient has been referred to GI for outpatient follow up of pancreatitis  Discharge Condition: stable CODE STATUS: full code Diet recommendation: heart healthy  Brief/Interim Summary: 52 y/o male with history of colon cancer s/p partial colectomy, gallstone pancreatitis s/p cholecystectomy, admitted to the hospital with acute pancreatitis. Found to have lipase of 3010. Imaging did not indicate any CBD dilatation. Patient was treated with supportive measures, bowel rest, pain management and IV fluids. His lipase trended down to near normal and diet was advanced. He is now able to tolerate a solid diet. His lisinopril has been held since this may have been precipitating agent. He was started on amlodipine in place of lisinopril for blood pressure control. He denies any recent alcohol use. He will be referred to outpatient GI clinic to discuss further work up if needed of pancreatitis. Patient is feeling ready for discharge home.   Discharge Diagnoses:  Principal Problem:   Acute pancreatitis Active Problems:   HTN (hypertension), benign   PTSD (post-traumatic stress disorder)   Tobacco abuse   Pancreatitis    Discharge Instructions  Discharge Instructions    Diet - low sodium heart healthy   Complete by:  As directed    Increase activity slowly   Complete by:  As directed      Allergies as of 06/10/2018      Reactions   Peanut-containing Drug Products Itching   Walnuts   Morphine And Related    Altered mental status-burning sensation all over      Medication List    STOP taking these medications   lisinopril 40 MG tablet Commonly known as:  PRINIVIL,ZESTRIL     TAKE these medications    amLODipine 5 MG tablet Commonly known as:  NORVASC Take 1 tablet (5 mg total) by mouth daily. Start taking on:  06/11/2018   aspirin 81 MG chewable tablet Chew 1 tablet (81 mg total) by mouth daily. What changed:  when to take this   cetirizine 10 MG tablet Commonly known as:  ZYRTEC Take 10 mg by mouth daily.   cyclobenzaprine 10 MG tablet Commonly known as:  FLEXERIL Take 10 mg by mouth 3 (three) times daily as needed for muscle spasms.   HYDROcodone-acetaminophen 5-325 MG tablet Commonly known as:  NORCO/VICODIN Take 1 tablet by mouth 3 (three) times daily as needed for moderate pain.   nitroGLYCERIN 0.4 MG SL tablet Commonly known as:  NITROSTAT Place 1 tablet (0.4 mg total) under the tongue every 5 (five) minutes as needed for chest pain.   sertraline 100 MG tablet Commonly known as:  ZOLOFT Take 100 mg by mouth 2 (two) times daily as needed (for mood).       Allergies  Allergen Reactions  . Peanut-Containing Drug Products Itching    Walnuts  . Morphine And Related     Altered mental status-burning sensation all over    Consultations:     Procedures/Studies: Dg Abdomen Acute W/chest  Result Date: 06/08/2018 CLINICAL DATA:  Abdominal pain EXAM: DG ABDOMEN ACUTE W/ 1V CHEST COMPARISON:  04/25/2018, CT 05/07/2018 FINDINGS: Single-view chest demonstrates streaky atelectasis at the bases. No pleural effusion. Normal heart size. Supine and upright views of the abdomen demonstrate no free air beneath  the diaphragm. Surgical clips in the right upper quadrant. Gas filled nonenlarged central small bowel loops with a a few fluid levels but with colon gas present. Moderate stool in the colon. IMPRESSION: 1. Minimal basilar atelectasis. 2. Overall nonobstructed gas pattern Electronically Signed   By: Donavan Foil M.D.   On: 06/08/2018 19:28   US Abdomen Limited Ruq  Result Date: 06/09/2018 CLINICAL DATA:  Acute pancreatitis. EXAM: ULTRASOUND ABDOMEN LIMITED RIGHT UPPER  QUADRANT COMPARISON:  06/08/2018.  CT 9 scratched it CT 05/08/2018. FINDINGS: Gallbladder: Cholecystectomy. Common bile duct: Diameter: 2.9 mm Liver: No focal lesion identified. Within normal limits in parenchymal echogenicity. Portal vein is patent on color Doppler imaging with normal direction of blood flow towards the liver. IMPRESSION: 1.  Cholecystectomy.  2.  No acute abnormality identified. Electronically Signed   By: Marcello Moores  Register   On: 06/09/2018 12:41       Subjective: Abdominal pain is better. No vomiting. Tolerating solid diet  Discharge Exam: Vitals:   06/09/18 2036 06/09/18 2112 06/10/18 0607 06/10/18 1302  BP: (!) 147/100  (!) 175/97 (!) 136/101  Pulse: 66  63 74  Resp: 18  18 18   Temp: 97.8 F (36.6 C)  98.4 F (36.9 C) 98.1 F (36.7 C)  TempSrc: Oral   Oral  SpO2: 94% 94% (!) 88% 97%  Weight:      Height:        General: Pt is alert, awake, not in acute distress Cardiovascular: RRR, S1/S2 +, no rubs, no gallops Respiratory: CTA bilaterally, no wheezing, no rhonchi Abdominal: Soft, NT, ND, bowel sounds +, LLQ colostomy Extremities: no edema, no cyanosis    The results of significant diagnostics from this hospitalization (including imaging, microbiology, ancillary and laboratory) are listed below for reference.     Microbiology: No results found for this or any previous visit (from the past 240 hour(s)).   Labs: BNP (last 3 results) No results for input(s): BNP in the last 8760 hours. Basic Metabolic Panel: Recent Labs  Lab 06/08/18 1739 06/09/18 0528 06/10/18 0553  NA 137 137 136  K 4.0 4.0 3.9  CL 101 106 102  CO2 29 25 27   GLUCOSE 102* 89 100*  BUN 12 11 9   CREATININE 0.84 0.74 0.80  CALCIUM 9.1 8.4* 8.7*   Liver Function Tests: Recent Labs  Lab 06/08/18 1739 06/09/18 0528  AST 13* 12*  ALT 14 13  ALKPHOS 120 107  BILITOT 0.5 0.6  PROT 7.4 6.0*  ALBUMIN 4.0 3.1*   Recent Labs  Lab 06/08/18 1739 06/09/18 0528 06/10/18 0553   LIPASE 3,010* 1,227* 152*   No results for input(s): AMMONIA in the last 168 hours. CBC: Recent Labs  Lab 06/08/18 1739 06/09/18 0528 06/10/18 0553  WBC 13.0* 8.9 6.6  HGB 16.9 14.7 15.4  HCT 50.7 45.3 46.1  MCV 83.3 83.6 82.6  PLT 223 190 195   Cardiac Enzymes: No results for input(s): CKTOTAL, CKMB, CKMBINDEX, TROPONINI in the last 168 hours. BNP: Invalid input(s): POCBNP CBG: No results for input(s): GLUCAP in the last 168 hours. D-Dimer No results for input(s): DDIMER in the last 72 hours. Hgb A1c No results for input(s): HGBA1C in the last 72 hours. Lipid Profile Recent Labs    06/08/18 1739  CHOL 205*  HDL 33*  LDLCALC 155*  TRIG 85  CHOLHDL 6.2   Thyroid function studies No results for input(s): TSH, T4TOTAL, T3FREE, THYROIDAB in the last 72 hours.  Invalid input(s): FREET3 Anemia work up  No results for input(s): VITAMINB12, FOLATE, FERRITIN, TIBC, IRON, RETICCTPCT in the last 72 hours. Urinalysis    Component Value Date/Time   COLORURINE YELLOW 06/08/2018 [place] 06/08/2018 1705   LABSPEC 1.012 06/08/2018 1705   PHURINE 7.0 06/08/2018 1705   GLUCOSEU NEGATIVE 06/08/2018 1705   HGBUR MODERATE (A) 06/08/2018 1705   BILIRUBINUR NEGATIVE 06/08/2018 1705   KETONESUR NEGATIVE 06/08/2018 1705   PROTEINUR NEGATIVE 06/08/2018 1705   UROBILINOGEN 0.2 12/04/2013 1635   NITRITE NEGATIVE 06/08/2018 1705   LEUKOCYTESUR NEGATIVE 06/08/2018 1705   Sepsis Labs Invalid input(s): PROCALCITONIN,  WBC,  LACTICIDVEN Microbiology No results found for this or any previous visit (from the past 240 hour(s)).   Time coordinating discharge: 59mins  SIGNED:   Kathie Dike, MD  [facility] 06/10/2018, 7:51 PM Pager   If 7PM-7AM, please contact night-coverage [email or web address] Password [email or web address]

## 2019-03-03 ENCOUNTER — Other Ambulatory Visit: Payer: Self-pay

## 2019-03-03 ENCOUNTER — Emergency Department (HOSPITAL_COMMUNITY)
Admission: EM | Admit: 2019-03-03 | Discharge: 2019-03-04 | Disposition: A | Discharge status: Home / Self Care | Payer: Medicare Other | Attending: Emergency Medicine | Admitting: Emergency Medicine

## 2019-03-03 ENCOUNTER — Encounter (HOSPITAL_COMMUNITY): Payer: Self-pay | Admitting: Emergency Medicine

## 2019-03-03 DIAGNOSIS — F17210 Nicotine dependence, cigarettes, uncomplicated: Secondary | ICD-10-CM | POA: Diagnosis not present

## 2019-03-03 DIAGNOSIS — M7918 Myalgia, other site: Secondary | ICD-10-CM | POA: Diagnosis not present

## 2019-03-03 DIAGNOSIS — I10 Essential (primary) hypertension: Secondary | ICD-10-CM | POA: Diagnosis not present

## 2019-03-03 DIAGNOSIS — Z85038 Personal history of other malignant neoplasm of large intestine: Secondary | ICD-10-CM | POA: Insufficient documentation

## 2019-03-03 DIAGNOSIS — Z7982 Long term (current) use of aspirin: Secondary | ICD-10-CM | POA: Diagnosis not present

## 2019-03-03 DIAGNOSIS — R109 Unspecified abdominal pain: Secondary | ICD-10-CM

## 2019-03-03 DIAGNOSIS — Z79899 Other long term (current) drug therapy: Secondary | ICD-10-CM | POA: Diagnosis not present

## 2019-03-03 DIAGNOSIS — Z91010 Allergy to peanuts: Secondary | ICD-10-CM | POA: Insufficient documentation

## 2019-03-03 MED ORDER — FENTANYL CITRATE (PF) 100 MCG/2ML IJ SOLN
50.0000 ug | Freq: Once | INTRAMUSCULAR | Status: AC
  Administered 2019-03-04: 50 ug via INTRAVENOUS
  Filled 2019-03-03: qty 2

## 2019-03-03 MED ORDER — ONDANSETRON HCL 4 MG/2ML IJ SOLN
4.0000 mg | Freq: Once | INTRAMUSCULAR | Status: AC
  Administered 2019-03-04: 4 mg via INTRAVENOUS
  Filled 2019-03-03: qty 2

## 2019-03-03 MED ORDER — SODIUM CHLORIDE 0.9 % IV BOLUS
1000.0000 mL | Freq: Once | INTRAVENOUS | Status: AC
  Administered 2019-03-04: 1000 mL via INTRAVENOUS

## 2019-03-03 NOTE — ED Triage Notes (Signed)
Pt c/o left and right sided flank pain (right side worse) since last night, pt denies urinary symptoms, has a colostomy bag, reports last BM 1 hour ago

## 2019-03-04 ENCOUNTER — Emergency Department (HOSPITAL_COMMUNITY): Payer: Medicare Other

## 2019-03-04 LAB — CBC WITH DIFFERENTIAL/PLATELET
Abs Immature Granulocytes: 0.04 10*3/uL (ref 0.00–0.07)
Basophils Absolute: 0 10*3/uL (ref 0.0–0.1)
Basophils Relative: 0 %
Eosinophils Absolute: 0.1 10*3/uL (ref 0.0–0.5)
Eosinophils Relative: 1 %
HCT: 49.8 % (ref 39.0–52.0)
Hemoglobin: 16.4 g/dL (ref 13.0–17.0)
Immature Granulocytes: 0 %
Lymphocytes Relative: 10 %
Lymphs Abs: 1 10*3/uL (ref 0.7–4.0)
MCH: 26.7 pg (ref 26.0–34.0)
MCHC: 32.9 g/dL (ref 30.0–36.0)
MCV: 81.1 fL (ref 80.0–100.0)
Monocytes Absolute: 0.6 10*3/uL (ref 0.1–1.0)
Monocytes Relative: 6 %
Neutro Abs: 8.3 10*3/uL — ABNORMAL HIGH (ref 1.7–7.7)
Neutrophils Relative %: 83 %
Platelets: 249 10*3/uL (ref 150–400)
RBC: 6.14 MIL/uL — ABNORMAL HIGH (ref 4.22–5.81)
RDW: 15.2 % (ref 11.5–15.5)
WBC: 10.1 10*3/uL (ref 4.0–10.5)
nRBC: 0 % (ref 0.0–0.2)

## 2019-03-04 LAB — URINALYSIS, ROUTINE W REFLEX MICROSCOPIC
Bilirubin Urine: NEGATIVE
Glucose, UA: NEGATIVE mg/dL
Ketones, ur: NEGATIVE mg/dL
Leukocytes,Ua: NEGATIVE
Nitrite: NEGATIVE
Protein, ur: NEGATIVE mg/dL
Specific Gravity, Urine: 1.01 (ref 1.005–1.030)
pH: 7 (ref 5.0–8.0)

## 2019-03-04 LAB — BASIC METABOLIC PANEL
Anion gap: 13 (ref 5–15)
BUN: 8 mg/dL (ref 6–20)
CO2: 24 mmol/L (ref 22–32)
Calcium: 9.3 mg/dL (ref 8.9–10.3)
Chloride: 102 mmol/L (ref 98–111)
Creatinine, Ser: 0.78 mg/dL (ref 0.61–1.24)
GFR calc Af Amer: >60 mL/min (ref >60)
GFR calc non Af Amer: >60 mL/min (ref >60)
Glucose, Bld: 123 mg/dL — ABNORMAL HIGH (ref 70–99)
Potassium: 3.4 mmol/L — ABNORMAL LOW (ref 3.5–5.1)
Sodium: 139 mmol/L (ref 135–145)

## 2019-03-04 IMAGING — CT CT RENAL STONE PROTOCOL
2 of 4 series · 16 of 46 positions shown, 18 images · non-contrast
Comparison: CT dated [DATE]

CLINICAL DATA: Flank pain, worse on the right.

EXAM:
CT ABDOMEN AND PELVIS WITHOUT CONTRAST
TECHNIQUE: Multidetector CT imaging of the abdomen and pelvis was performed
following the standard protocol without IV contrast.

[Series 2: axial st · axial · 0.79mm/px · z∈[+657,+1087]mm · 13 of 98 slices shown, 15 images]
[im 6/98  soft-tissue]
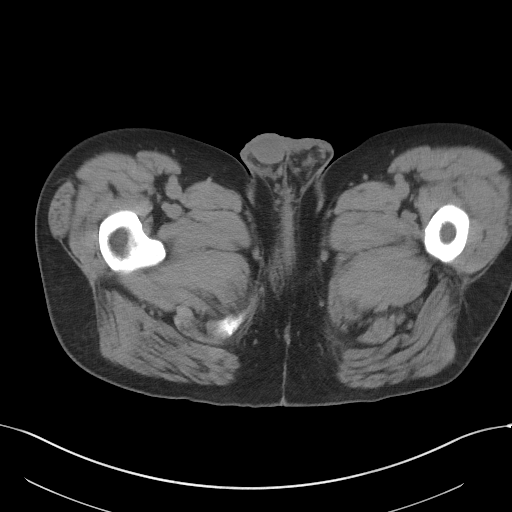
[im 6/98  bone]
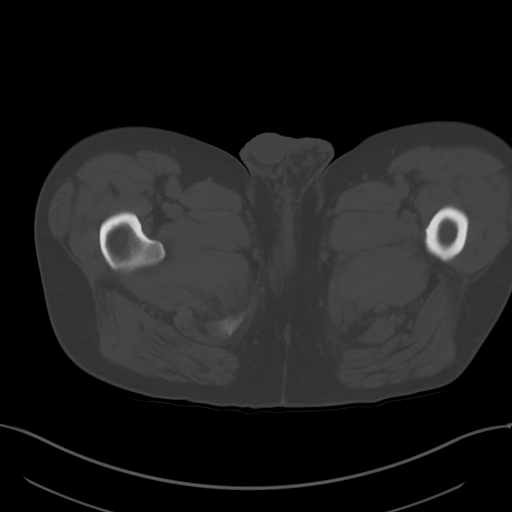
[im 12/98  soft-tissue]
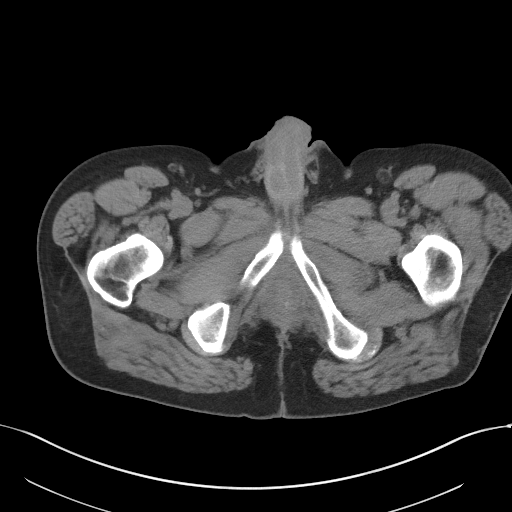
[im 23/98  soft-tissue]
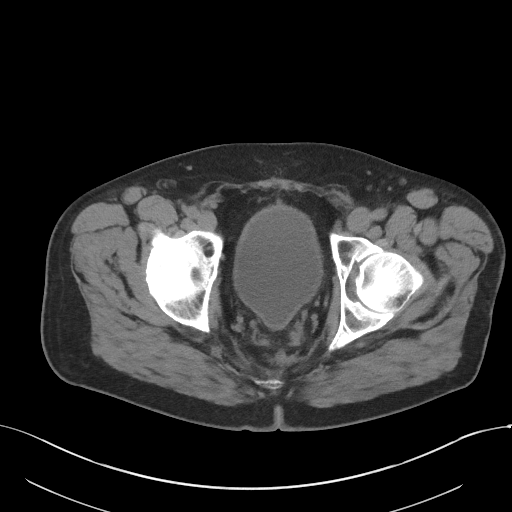
[im 29/98  soft-tissue]
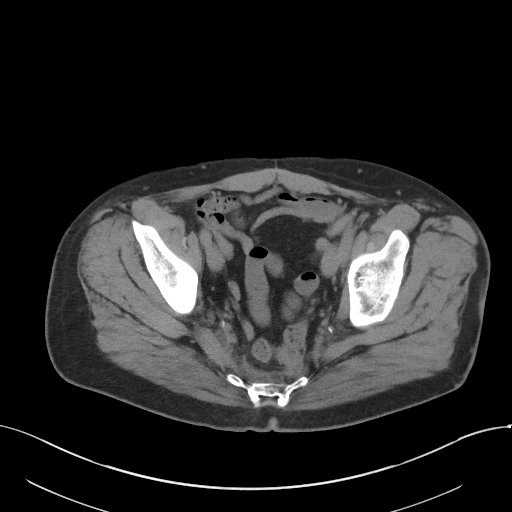
[im 35/98  soft-tissue]
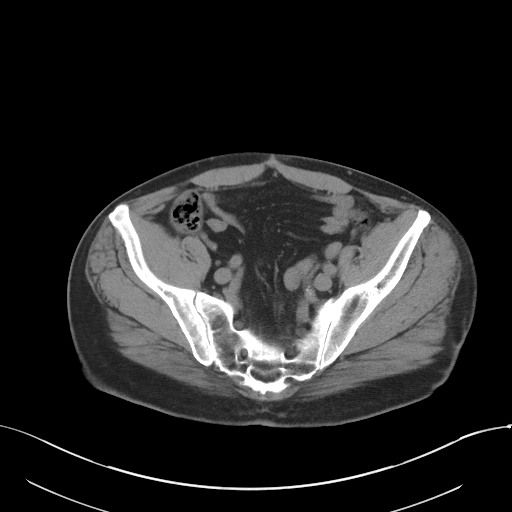
[im 40/98  soft-tissue]
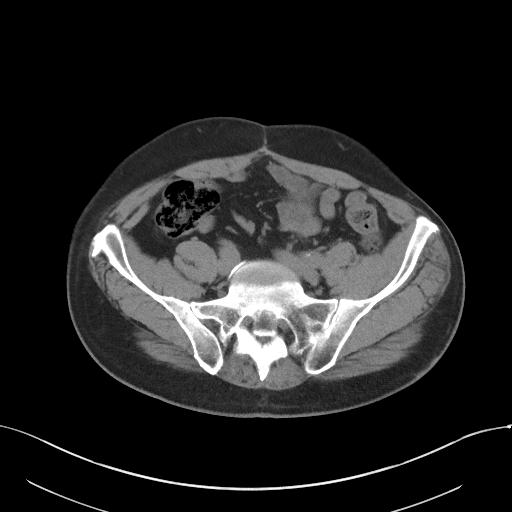
[im 52/98  soft-tissue]
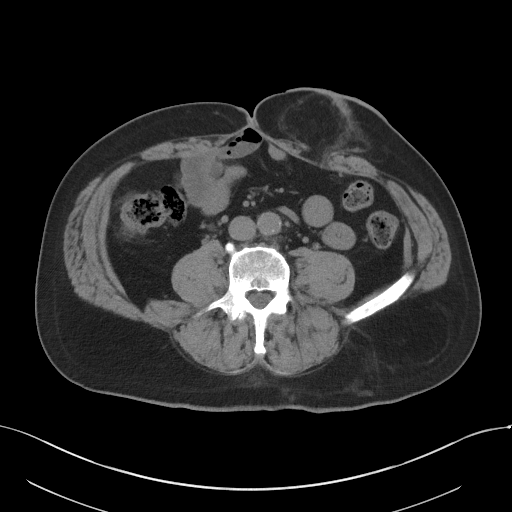
[im 58/98  soft-tissue]
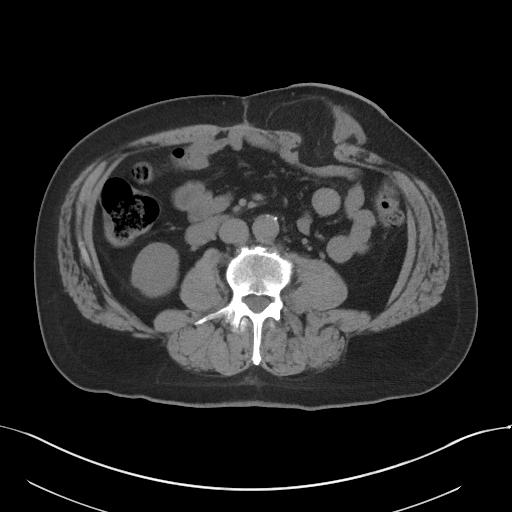
[im 63/98  soft-tissue]
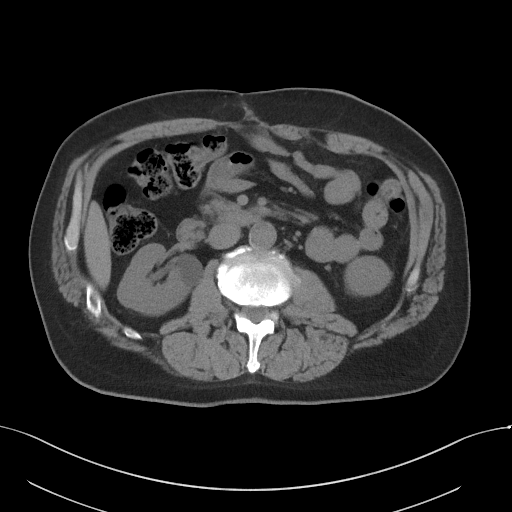
[im 63/98  bone]
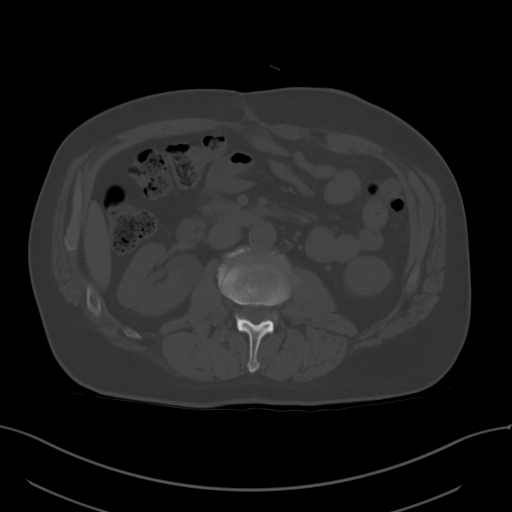
[im 69/98  soft-tissue]
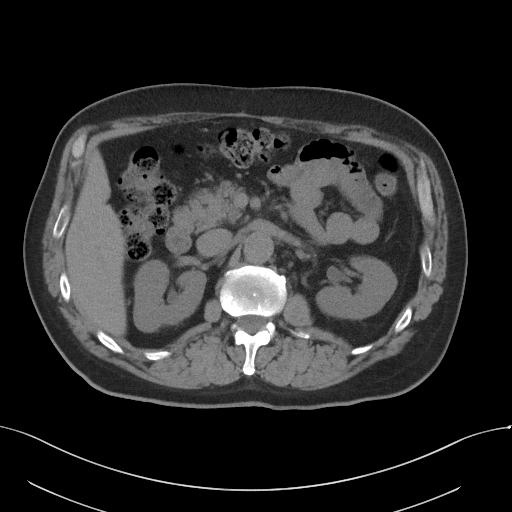
[im 75/98  soft-tissue]
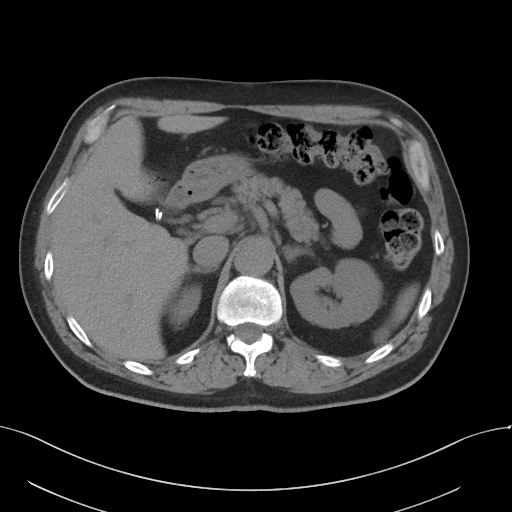
[im 86/98  soft-tissue]
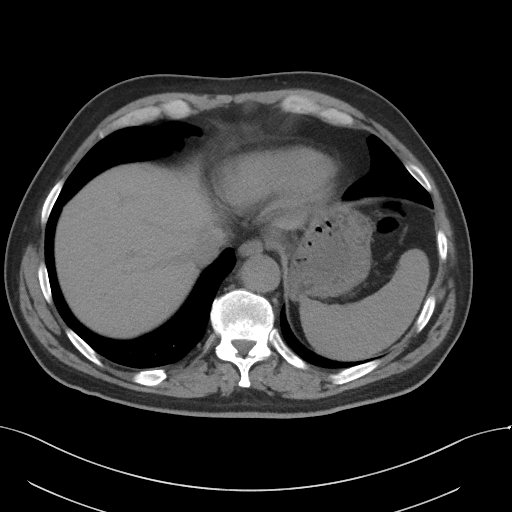
[im 92/98  soft-tissue]
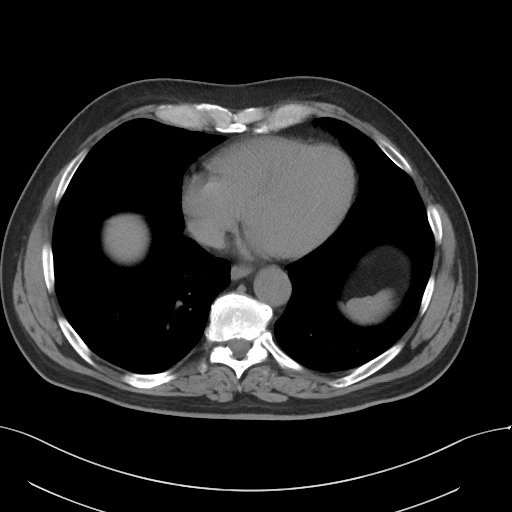

[Series 5: coronal st · coronal · 0.80mm/px · 3 of 98 slices shown]
[im 33/98  soft-tissue]
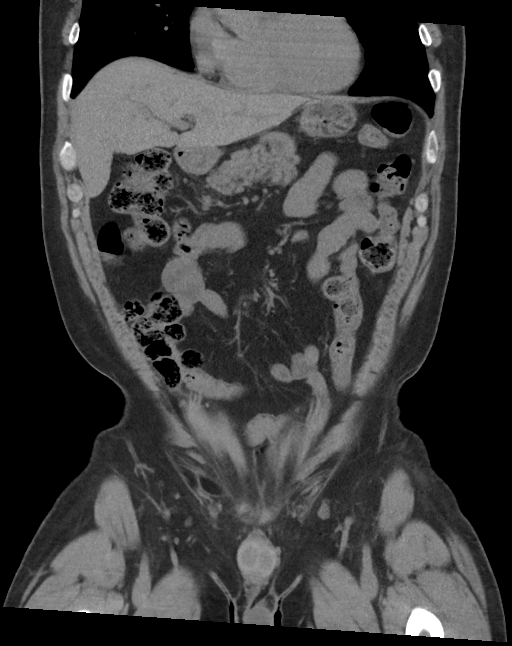
[im 44/98  soft-tissue]
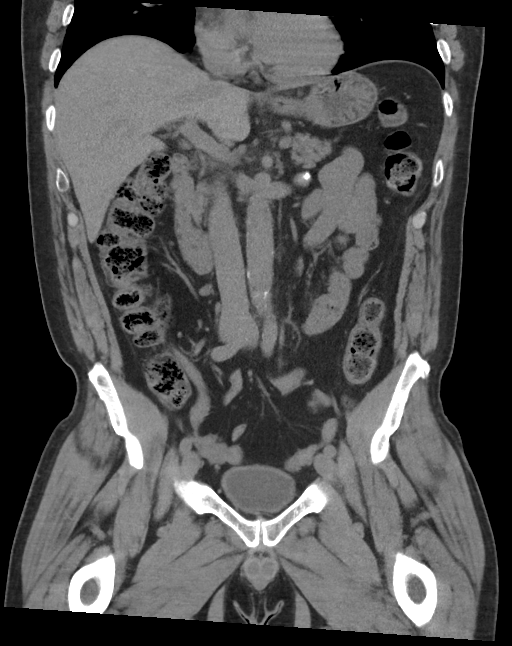
[im 54/98  soft-tissue]
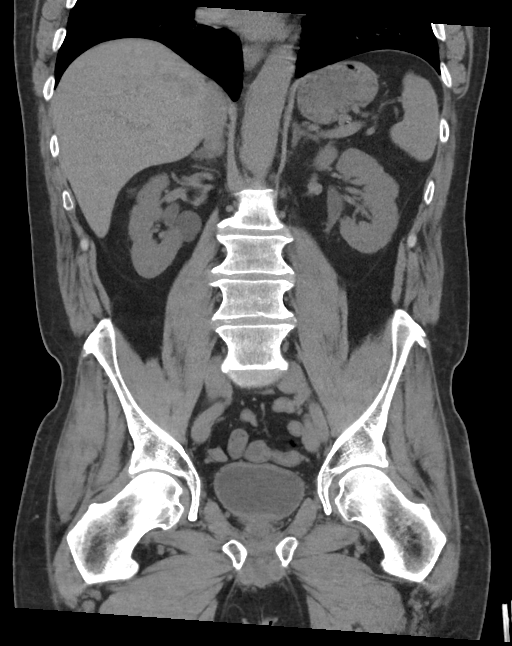

[16 of 46 positions shown; findings below may reference images not displayed]

FINDINGS: Lower chest: No acute abnormality.

Hepatobiliary: No focal liver abnormality is seen. Status post
cholecystectomy. No biliary dilatation.

Pancreas: Unremarkable. No pancreatic ductal dilatation or
surrounding inflammatory changes.

Spleen: Normal in size without focal abnormality.

Adrenals/Urinary Tract: Adrenal glands are unremarkable. Kidneys are
normal, without renal calculi, focal lesion, or hydronephrosis.
Bladder is unremarkable.

Stomach/Bowel: The patient is status post prior partial colectomy.
There is an end colostomy in the left lower quadrant that appears
grossly stable. There is a moderate amount of stool in the colon.
The appendix is unremarkable. There is no evidence of a small-bowel
obstruction. The stomach is unremarkable.

Vascular/Lymphatic: Aortic atherosclerosis. No enlarged abdominal or
pelvic lymph nodes.

Reproductive: Prostate is unremarkable.

Other: A fat containing peristomal hernia is again noted

Musculoskeletal: No acute or significant osseous findings. There is
stable soft tissue in the presacral region.
IMPRESSION: 1. No acute abnormality. No hydronephrosis. No radiopaque
obstructing kidney stones.
2. Stable postsurgical changes as above without evidence of an
obstruction.

## 2019-03-04 NOTE — ED Provider Notes (Signed)
[facility] EMERGENCY DEPARTMENT Provider Note   CSN: [id number] Arrival date & time: 03/03/19  2328    History   Chief Complaint Chief Complaint  Patient presents with  . Flank Pain    HPI Clinton Ross is a 53 y.o. male.     HPI  This is a 53 year old male with a history of pancreatitis, colon cancer status post colostomy, hypertension who presents with flank pain.  Patient reports onset of flank pain yesterday.  He reports that initially was dull and achy right greater than left.  However, last night it became sharp and more intense.  It seems to come and go.  He states he is unable to get comfortable.  He did drive to [place] today and notes that the pain mostly went away.  However, upon return home pain worsened.  It does not radiate.  He rates his pain currently at 7 out of 10.  He has not taken anything for the pain.  He denies any hematuria, dysuria, chest pain, shortness of breath.  He reports nausea without vomiting.  He reports normal ostomy output.  Past Medical History:  Diagnosis Date  . Acute pancreatitis 10/2013  . Arthritis   . Bulging disc   . Cancer [facility])    Colon  . Chronic knee pain   . Colostomy in place [facility])   . Gallstones   . Hypertension   . PTSD (post-traumatic stress disorder)   . Sciatica     Patient Active Problem List   Diagnosis Date Noted  . Pancreatitis 06/08/2018  . Chest pain 07/21/2016  . Cholelithiasis 11/28/2013  . Tobacco abuse 11/28/2013  . Abdominal pain 11/27/2013  . Acute pancreatitis 11/20/2013  . Constipation 11/20/2013  . HTN (hypertension), benign 11/20/2013  . PTSD (post-traumatic stress disorder) 11/20/2013  . Sciatica 05/24/2009  . DERANGEMENT MENISCUS 10/24/2008  . JOINT EFFUSION, KNEE 10/24/2008  . KNEE PAIN 10/24/2008    Past Surgical History:  Procedure Laterality Date  . CHOLECYSTECTOMY    . COLON SURGERY    . rectal cancer Right         Home Medications    Prior to Admission medications    Medication Sig Start Date End Date Taking? Authorizing Provider  amLODipine (NORVASC) 5 MG tablet Take 1 tablet (5 mg total) by mouth daily. 06/11/18   Kathie Dike, MD  aspirin 81 MG chewable tablet Chew 1 tablet (81 mg total) by mouth daily. Patient taking differently: Chew 81 mg by mouth 3 (three) times a week.  02/23/18   Julianne Rice, MD  cetirizine (ZYRTEC) 10 MG tablet Take 10 mg by mouth daily.    [provider]  cyclobenzaprine (FLEXERIL) 10 MG tablet Take 10 mg by mouth 3 (three) times daily as needed for muscle spasms.    [provider]  HYDROcodone-acetaminophen (NORCO/VICODIN) 5-325 MG tablet Take 1 tablet by mouth 3 (three) times daily as needed for moderate pain.    [provider]  nitroGLYCERIN (NITROSTAT) 0.4 MG SL tablet Place 1 tablet (0.4 mg total) under the tongue every 5 (five) minutes as needed for chest pain. 02/23/18   Julianne Rice, MD  sertraline (ZOLOFT) 100 MG tablet Take 100 mg by mouth 2 (two) times daily as needed (for mood).     [provider]    Family History Family History  Problem Relation Age of Onset  . Hypertension Mother   . Stroke Father   . Diabetes Other     Social History  Social History   Tobacco Use  . Smoking status: Current Every Day Smoker    Packs/day: 0.50    Years: 30.00    Pack years: 15.00    Types: Cigarettes  . Smokeless tobacco: Never Used  Substance Use Topics  . Alcohol use: No  . Drug use: No     Allergies   Peanut-containing drug products and Morphine and related   Review of Systems Review of Systems  Constitutional: Negative for fever.  Respiratory: Negative for shortness of breath.   Cardiovascular: Negative for chest pain.  Gastrointestinal: Positive for nausea. Negative for abdominal pain, constipation, diarrhea and vomiting.  Genitourinary: Positive for flank pain. Negative for dysuria and hematuria.  Skin: Negative for rash.  All other systems reviewed and  are negative.    Physical Exam Updated Vital Signs BP (!) 149/108   Pulse 86   Temp 98 F (36.7 C) (Oral)   Resp 17   Ht 1.778 m (5\' 10" )   Wt 86.2 kg   SpO2 98%   BMI 27.26 kg/m   Physical Exam Vitals signs and nursing note reviewed.  Constitutional:      Appearance: He is well-developed. He is not ill-appearing.  HENT:     Head: Normocephalic and atraumatic.     Mouth/Throat:     Mouth: Mucous membranes are moist.  Neck:     Musculoskeletal: Neck supple.  Cardiovascular:     Rate and Rhythm: Normal rate and regular rhythm.     Heart sounds: Normal heart sounds. No murmur.  Pulmonary:     Effort: Pulmonary effort is normal. No respiratory distress.     Breath sounds: Normal breath sounds. No wheezing.  Abdominal:     General: Bowel sounds are normal.     Palpations: Abdomen is soft.     Tenderness: There is no abdominal tenderness. There is right CVA tenderness and left CVA tenderness. There is no rebound.     Comments: Ostomy noted left mid abdomen  Musculoskeletal:     Right lower leg: No edema.     Left lower leg: No edema.  Lymphadenopathy:     Cervical: No cervical adenopathy.  Skin:    General: Skin is warm and dry.     Findings: No rash.  Neurological:     Mental Status: He is alert and oriented to person, place, and time.  Psychiatric:        Mood and Affect: Mood normal.      ED Treatments / Results  Labs (all labs ordered are listed, but only abnormal results are displayed) Labs Reviewed  CBC WITH DIFFERENTIAL/PLATELET - Abnormal; Notable for the following components:      Result Value   RBC 6.14 (*)    Neutro Abs 8.3 (*)    All other components within normal limits  BASIC METABOLIC PANEL - Abnormal; Notable for the following components:   Potassium 3.4 (*)    Glucose, Bld 123 (*)    All other components within normal limits  URINALYSIS, ROUTINE W REFLEX MICROSCOPIC - Abnormal; Notable for the following components:   Color, Urine STRAW  (*)    Hgb urine dipstick MODERATE (*)    Bacteria, UA RARE (*)    All other components within normal limits    EKG None  Radiology Ct Renal Stone Study  Result Date: 03/04/2019 CLINICAL DATA:  Flank pain, worse on the right. EXAM: CT ABDOMEN AND PELVIS WITHOUT CONTRAST TECHNIQUE: Multidetector CT imaging of the abdomen and  pelvis was performed following the standard protocol without IV contrast. COMPARISON:  CT dated 05/07/2018 FINDINGS: Lower chest: No acute abnormality. Hepatobiliary: No focal liver abnormality is seen. Status post cholecystectomy. No biliary dilatation. Pancreas: Unremarkable. No pancreatic ductal dilatation or surrounding inflammatory changes. Spleen: Normal in size without focal abnormality. Adrenals/Urinary Tract: Adrenal glands are unremarkable. Kidneys are normal, without renal calculi, focal lesion, or hydronephrosis. Bladder is unremarkable. Stomach/Bowel: The patient is status post prior partial colectomy. There is an end colostomy in the left lower quadrant that appears grossly stable. There is a moderate amount of stool in the colon. The appendix is unremarkable. There is no evidence of a small-bowel obstruction. The stomach is unremarkable. Vascular/Lymphatic: Aortic atherosclerosis. No enlarged abdominal or pelvic lymph nodes. Reproductive: Prostate is unremarkable. Other: A fat containing peristomal hernia is again noted Musculoskeletal: No acute or significant osseous findings. There is stable soft tissue in the presacral region. IMPRESSION: 1. No acute abnormality. No hydronephrosis. No radiopaque obstructing kidney stones. 2. Stable postsurgical changes as above without evidence of an obstruction. Electronically Signed   By: Constance Holster M.D.   On: 03/04/2019 00:46    Procedures Procedures (including critical care time)  Medications Ordered in ED Medications  sodium chloride 0.9 % bolus 1,000 mL (1,000 mLs Intravenous New Bag/Given 03/04/19 0004)   ondansetron (ZOFRAN) injection 4 mg (4 mg Intravenous Given 03/04/19 0004)  fentaNYL (SUBLIMAZE) injection 50 mcg (50 mcg Intravenous Given 03/04/19 0004)     Initial Impression / Assessment and Plan / ED Course  I have reviewed the triage vital signs and the nursing notes.  Pertinent labs & imaging results that were available during my care of the patient were reviewed by me and considered in my medical decision making (see chart for details).        Patient presents with flank pain.  He is overall nontoxic-appearing and vital signs are reassuring.  Considerations include kidney stone, UTI, musculoskeletal pain.  Patient was given pain and nausea medication.  Basic lab work obtained.  Urinalysis is reassuring.  No significant leukocytosis.  No metabolic derangements.  CT stone study does not show any evidence of acute stones.  Suspect musculoskeletal etiology.  Patient reports that he is out of his hydrocodone.  Recommend anti-inflammatories and patient was provided with a prescription for Flexeril.   After history, exam, and medical workup I feel the patient has been appropriately medically screened and is safe for discharge home. Pertinent diagnoses were discussed with the patient. Patient was given return precautions.   Final Clinical Impressions(s) / ED Diagnoses   Final diagnoses:  Flank pain  Musculoskeletal pain    ED Discharge Orders    None       , Barbette Hair, MD 03/04/19 [phone]

## 2019-03-04 NOTE — ED Notes (Signed)
Went over pt's discharge and gave him prescription for flexeril. Pt verbalized understanding of discharge instructions.

## 2019-08-06 DIAGNOSIS — F17200 Nicotine dependence, unspecified, uncomplicated: Secondary | ICD-10-CM | POA: Diagnosis not present

## 2019-08-06 DIAGNOSIS — I10 Essential (primary) hypertension: Secondary | ICD-10-CM | POA: Diagnosis not present

## 2019-08-06 DIAGNOSIS — N528 Other male erectile dysfunction: Secondary | ICD-10-CM | POA: Diagnosis not present

## 2019-08-06 DIAGNOSIS — C20 Malignant neoplasm of rectum: Secondary | ICD-10-CM | POA: Diagnosis not present

## 2019-10-12 ENCOUNTER — Ambulatory Visit (INDEPENDENT_AMBULATORY_CARE_PROVIDER_SITE_OTHER): Payer: Medicare HMO

## 2019-10-12 ENCOUNTER — Ambulatory Visit
Admission: EM | Admit: 2019-10-12 | Discharge: 2019-10-12 | Disposition: A | Discharge status: Home / Self Care | Payer: Non-veteran care | Attending: Emergency Medicine | Admitting: Emergency Medicine

## 2019-10-12 DIAGNOSIS — S4991XA Unspecified injury of right shoulder and upper arm, initial encounter: Secondary | ICD-10-CM

## 2019-10-12 DIAGNOSIS — M25511 Pain in right shoulder: Secondary | ICD-10-CM

## 2019-10-12 DIAGNOSIS — M7581 Other shoulder lesions, right shoulder: Secondary | ICD-10-CM

## 2019-10-12 IMAGING — DX DG SHOULDER 2+V*R*
3 series · 3 of 3 positions shown · non-contrast
Comparison: None.

CLINICAL DATA: Injury

EXAM:
RIGHT SHOULDER - 2+ VIEW

[shoulder internal rotation ap]
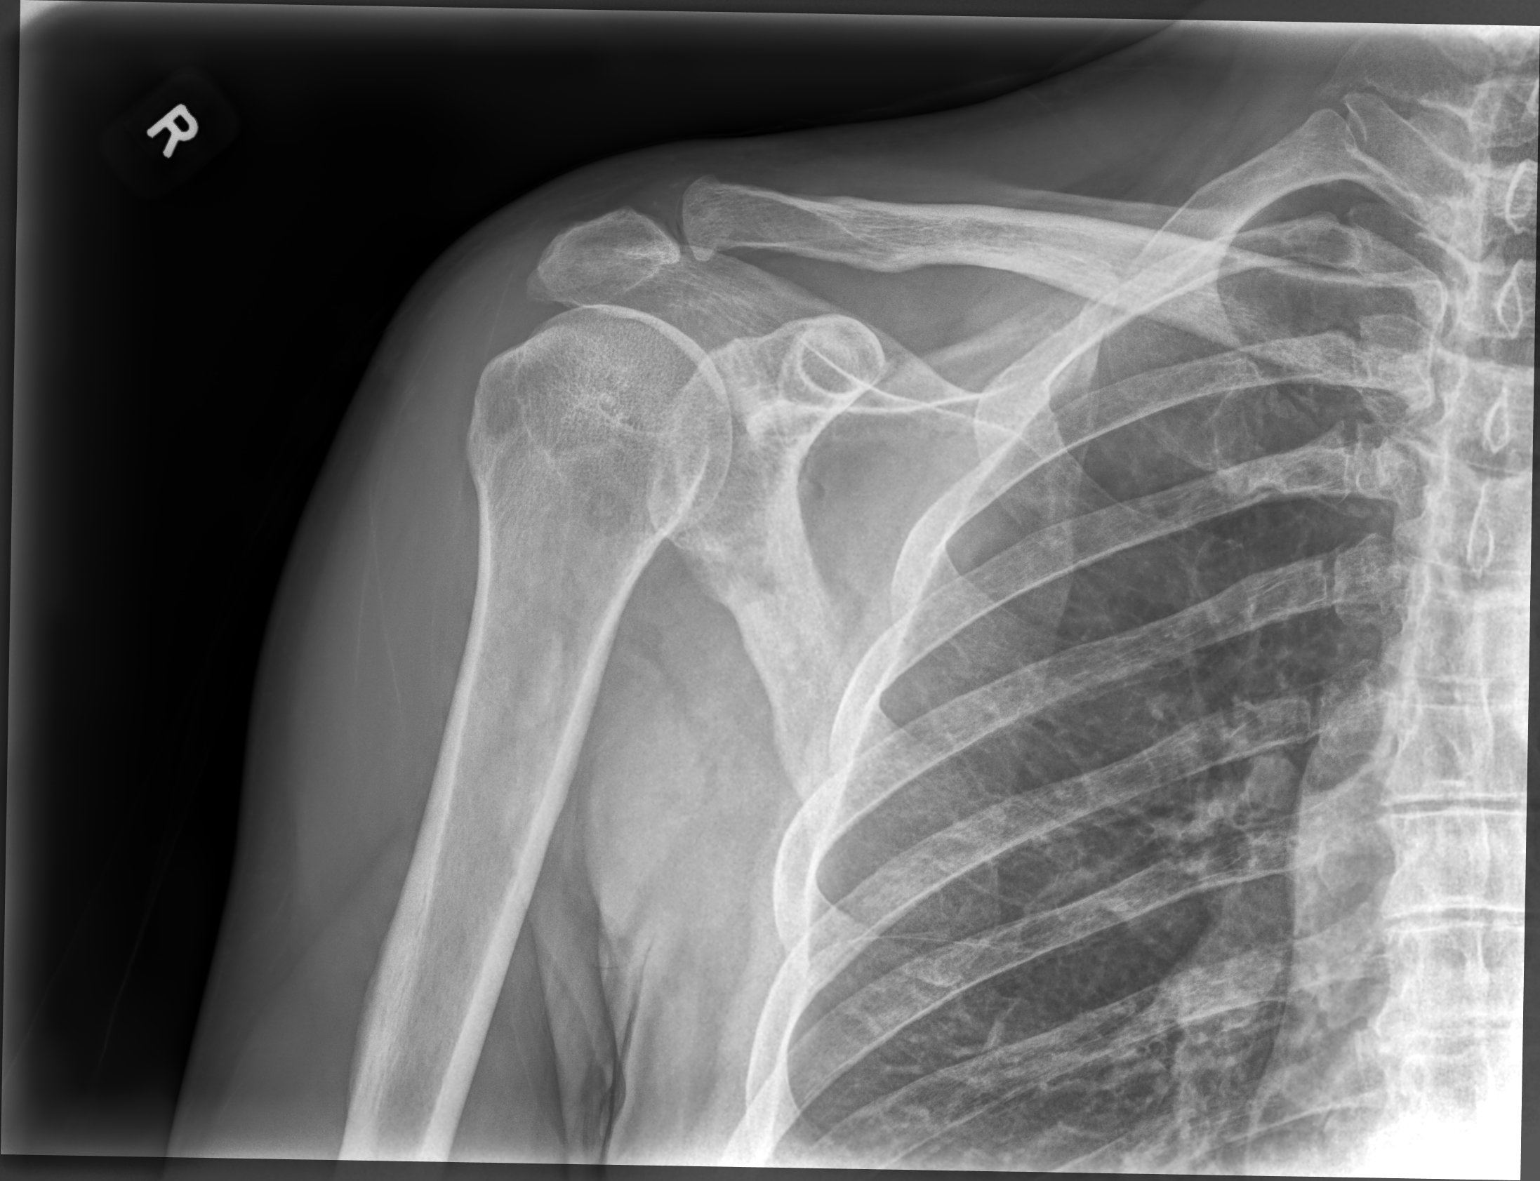

[shoulder external rotation ap]
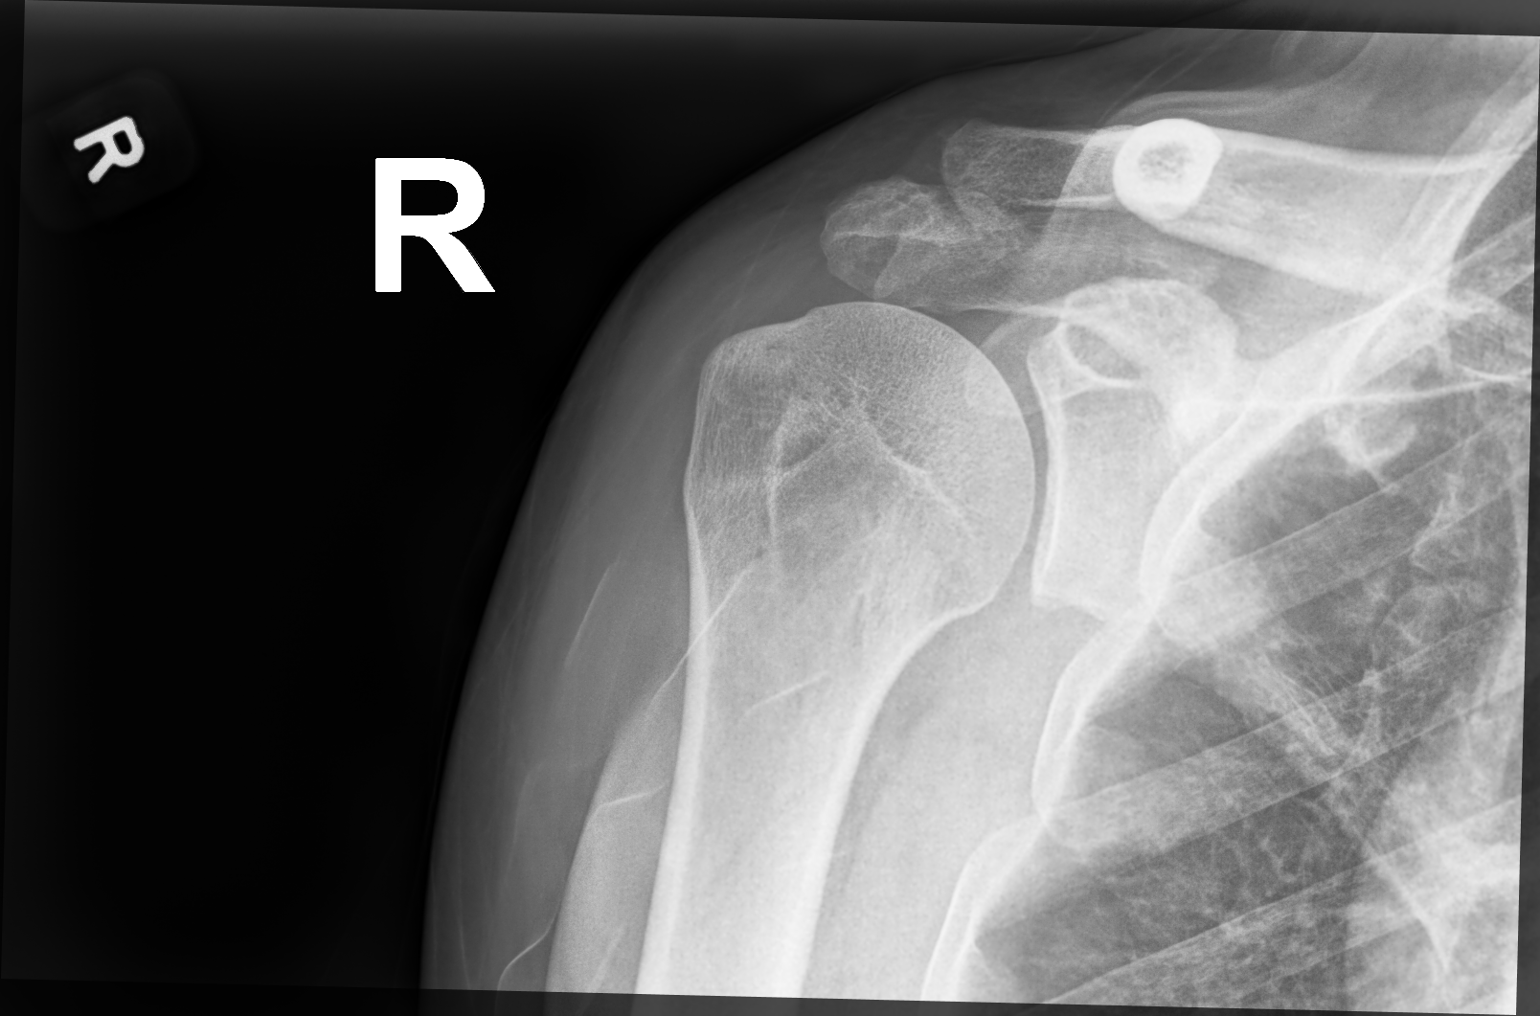

[shoulder (y view) lat]
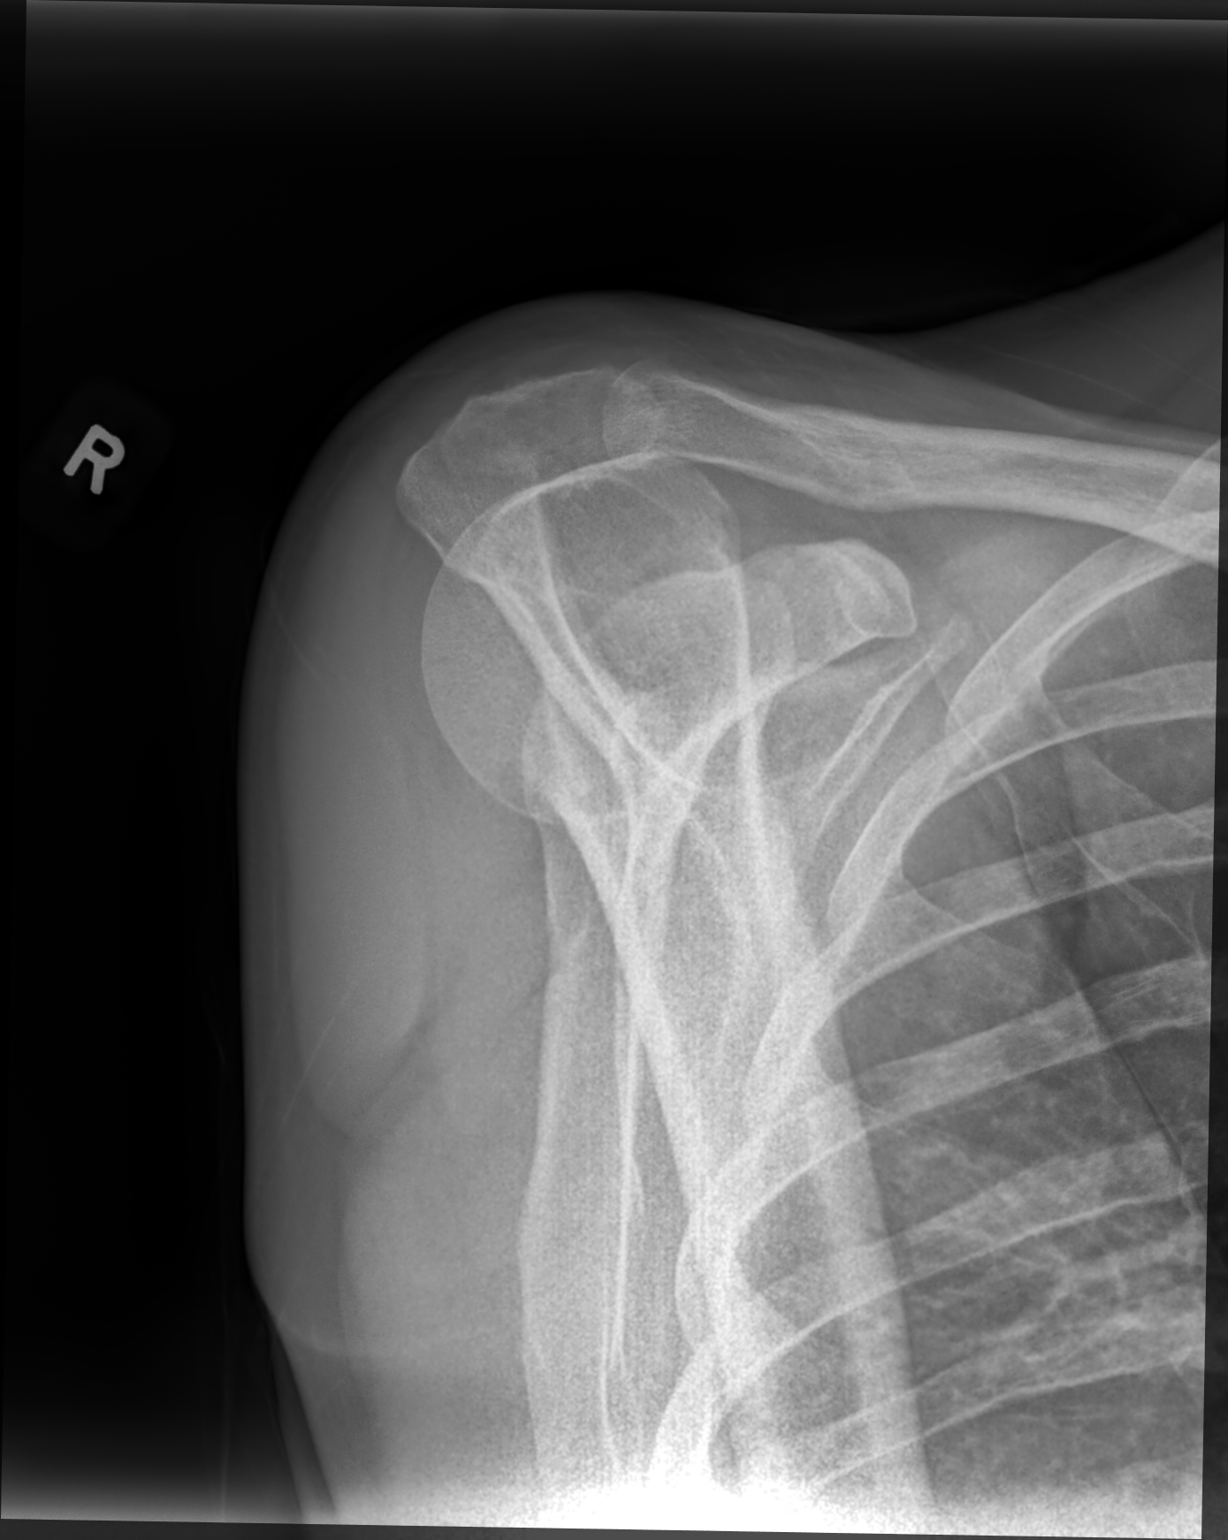

[3 of 3 positions shown; findings below may reference images not displayed]

FINDINGS: No fracture or dislocation of the right shoulder. Mild
acromioclavicular and glenohumeral arthrosis. The partially imaged
right chest is unremarkable.
IMPRESSION: No fracture or dislocation of the right shoulder. Mild
acromioclavicular and glenohumeral arthrosis.

## 2019-10-12 MED ORDER — KETOROLAC TROMETHAMINE 60 MG/2ML IM SOLN
60.0000 mg | Freq: Once | INTRAMUSCULAR | Status: AC
  Administered 2019-10-12: 60 mg via INTRAMUSCULAR

## 2019-10-12 MED ORDER — PREDNISONE 10 MG (21) PO TBPK: ORAL_TABLET | Freq: Every day | ORAL | 0 refills | Status: DC

## 2019-10-12 NOTE — ED Provider Notes (Signed)
[place]   [id number] 10/12/19 Arrival Time: [id number]  CC: RT shoulder pain  SUBJECTIVE: History from: patient. Clinton Ross is a 54 y.o. male complains of RT shoulder pain that began 3 days ago.  Hit a tree root while digging a ditch with his shovel.  Localizes the pain to the anterior shoulder and upper arm.  Describes the pain as constant and sharp in character.  Has tried 800 mg of ibuprofen without relief.  Symptoms are made worse with reaching overhead.  Denies similar symptoms in the past.  Complains of mild swelling, warm to the touch, numbness, catching, and weakness.  Denies fever, chills, chest pain, SOB, erythema, ecchymosis.   ROS: As per HPI.  All other pertinent ROS negative.     Past Medical History:  Diagnosis Date  . Acute pancreatitis 10/2013  . Arthritis   . Bulging disc   . Cancer [facility])    Colon  . Chronic knee pain   . Colostomy in place [facility])   . Gallstones   . Hypertension   . PTSD (post-traumatic stress disorder)   . Sciatica    Past Surgical History:  Procedure Laterality Date  . CHOLECYSTECTOMY    . COLON SURGERY    . rectal cancer Right    Allergies  Allergen Reactions  . Peanut-Containing Drug Products Itching    Walnuts  . Morphine And Related     Altered mental status-burning sensation all over   No current facility-administered medications on file prior to encounter.   Current Outpatient Medications on File Prior to Encounter  Medication Sig Dispense Refill  . amLODipine (NORVASC) 5 MG tablet Take 1 tablet (5 mg total) by mouth daily. 30 tablet 0  . aspirin 81 MG chewable tablet Chew 1 tablet (81 mg total) by mouth daily. (Patient taking differently: Chew 81 mg by mouth 3 (three) times a week. ) 30 tablet 0  . cetirizine (ZYRTEC) 10 MG tablet Take 10 mg by mouth daily.    . cyclobenzaprine (FLEXERIL) 10 MG tablet Take 10 mg by mouth 3 (three) times daily as needed for muscle spasms.    . nitroGLYCERIN (NITROSTAT) 0.4 MG SL tablet  Place 1 tablet (0.4 mg total) under the tongue every 5 (five) minutes as needed for chest pain. 30 tablet 0  . sertraline (ZOLOFT) 100 MG tablet Take 100 mg by mouth 2 (two) times daily as needed (for mood).      Social History   Socioeconomic History  . Marital status: Married    Spouse name: Not on file  . Number of children: Not on file  . Years of education: Not on file  . Highest education level: Not on file  Occupational History  . Not on file  Tobacco Use  . Smoking status: Current Every Day Smoker    Packs/day: 0.50    Years: 30.00    Pack years: 15.00    Types: Cigarettes  . Smokeless tobacco: Never Used  Substance and Sexual Activity  . Alcohol use: No  . Drug use: No  . Sexual activity: Not on file  Other Topics Concern  . Not on file  Social History Narrative  . Not on file   Social Determinants of Health   Financial Resource Strain:   . Difficulty of Paying Living Expenses: Not on file  Food Insecurity:   . Worried About Charity fundraiser in the Last Year: Not on file  . Ran Out of Food in the Last  Year: Not on file  Transportation Needs:   . Lack of Transportation (Medical): Not on file  . Lack of Transportation (Non-Medical): Not on file  Physical Activity:   . Days of Exercise per Week: Not on file  . Minutes of Exercise per Session: Not on file  Stress:   . Feeling of Stress : Not on file  Social Connections:   . Frequency of Communication with Friends and Family: Not on file  . Frequency of Social Gatherings with Friends and Family: Not on file  . Attends Religious Services: Not on file  . Active Member of Clubs or Organizations: Not on file  . Attends Archivist Meetings: Not on file  . Marital Status: Not on file  Intimate Partner Violence:   . Fear of Current or Ex-Partner: Not on file  . Emotionally Abused: Not on file  . Physically Abused: Not on file  . Sexually Abused: Not on file   Family History  Problem Relation Age of  Onset  . Hypertension Mother   . Stroke Father   . Diabetes Other     OBJECTIVE:  Vitals:   10/12/19 1537  BP: 134/85  Pulse: 90  Resp: 16  Temp: 98.1 F (36.7 C)  TempSrc: Oral  SpO2: 96%    General appearance: ALERT; in no acute distress.  Head: NCAT Lungs: Normal respiratory effort; CTAB CV: RRR Musculoskeletal: RT shoulder Inspection: Skin warm, dry, clear and intact without obvious erythema, effusion, or ecchymosis.  Palpation: TTP over distal clavicle, subacromial space, and anterior deltoid ROM: LROM 0-90 degrees passive Strength: 3+/5 shld abduction, 5/5 shld adduction, 5/5 elbow flexion, 5/5 elbow extension Skin: warm and dry Neurologic: Ambulates without difficulty; Sensation intact about the upper extremities Psychological: alert and cooperative; normal mood and affect  DIAGNOSTIC STUDIES:  DG Shoulder Right  Result Date: 10/12/2019 CLINICAL DATA:  Injury EXAM: RIGHT SHOULDER - 2+ VIEW COMPARISON:  None. FINDINGS: No fracture or dislocation of the right shoulder. Mild acromioclavicular and glenohumeral arthrosis. The partially imaged right chest is unremarkable. IMPRESSION: No fracture or dislocation of the right shoulder. Mild acromioclavicular and glenohumeral arthrosis. Electronically Signed   By: Eddie Candle M.D.   On: 10/12/2019 16:12    X-rays negative for bony abnormalities including fracture, or dislocation.  No soft tissue swelling.    I have reviewed the x-rays myself and the radiologist interpretation. I am in agreement with the radiologist interpretation.     ASSESSMENT & PLAN:  1. Acute pain of right shoulder   2. Injury of right shoulder, initial encounter   3. Rotator cuff tendinitis, right     Meds ordered this encounter  Medications  . ketorolac (TORADOL) injection 60 mg  . predniSONE (STERAPRED UNI-PAK 21 TAB) 10 MG (21) TBPK tablet    Sig: Take by mouth daily. Take 6 tabs by mouth daily  for 2 days, then 5 tabs for 2 days, then 4 tabs  for 2 days, then 3 tabs for 2 days, 2 tabs for 2 days, then 1 tab by mouth daily for 2 days    Dispense:  42 tablet    Refill:  0    Order Specific Question:   Supervising Provider    Answer:   Raylene Everts [id number]   Toradol shot given in office X-ray negative for fracture or dislocation Symptoms most likely musculoskeletal Avoid painful activities, but ensure adequate range of motion Continue conservative management of rest, ice, heat, and gentle stretches Prednisone dos pak  prescribed.  Take as directed and to completion Follow up with PCP or orthopedist for further evaluation and management Return or go to the ER if you have any new or worsening symptoms (fever, chills, chest pain, shortness of breath, worsening symptoms despite treatment, etc...)   Reviewed expectations re: course of current medical issues. Questions answered. Outlined signs and symptoms indicating need for more acute intervention. Patient verbalized understanding. After Visit Summary given.    Lestine Box, PA-C 10/12/19 [phone]

## 2019-10-12 NOTE — Discharge Instructions (Signed)
Toradol shot given in office X-ray negative for fracture or dislocation Symptoms most likely musculoskeletal Avoid painful activities, but ensure adequate range of motion Continue conservative management of rest, ice, heat, and gentle stretches Prednisone dos pak prescribed.  Take as directed and to completion Follow up with PCP or orthopedist for further evaluation and management Return or go to the ER if you have any new or worsening symptoms (fever, chills, chest pain, shortness of breath, worsening symptoms despite treatment, etc...)

## 2019-10-12 NOTE — ED Triage Notes (Signed)
Pt presents with c/o right shoulder pain after digging a hole , no swelling or deformity noted

## 2019-11-28 ENCOUNTER — Encounter (HOSPITAL_COMMUNITY): Payer: Self-pay | Admitting: *Deleted

## 2019-11-28 ENCOUNTER — Emergency Department (HOSPITAL_COMMUNITY): Payer: No Typology Code available for payment source

## 2019-11-28 ENCOUNTER — Emergency Department (HOSPITAL_COMMUNITY)
Admission: EM | Admit: 2019-11-28 | Discharge: 2019-11-28 | Disposition: A | Discharge status: Home / Self Care | Payer: No Typology Code available for payment source | Attending: Emergency Medicine | Admitting: Emergency Medicine

## 2019-11-28 ENCOUNTER — Other Ambulatory Visit: Payer: Self-pay

## 2019-11-28 DIAGNOSIS — N4889 Other specified disorders of penis: Secondary | ICD-10-CM | POA: Insufficient documentation

## 2019-11-28 DIAGNOSIS — I10 Essential (primary) hypertension: Secondary | ICD-10-CM | POA: Diagnosis not present

## 2019-11-28 DIAGNOSIS — G8918 Other acute postprocedural pain: Secondary | ICD-10-CM | POA: Diagnosis not present

## 2019-11-28 DIAGNOSIS — R1032 Left lower quadrant pain: Secondary | ICD-10-CM | POA: Diagnosis present

## 2019-11-28 DIAGNOSIS — Z79899 Other long term (current) drug therapy: Secondary | ICD-10-CM | POA: Diagnosis not present

## 2019-11-28 DIAGNOSIS — Z7982 Long term (current) use of aspirin: Secondary | ICD-10-CM | POA: Diagnosis not present

## 2019-11-28 DIAGNOSIS — R1909 Other intra-abdominal and pelvic swelling, mass and lump: Secondary | ICD-10-CM | POA: Diagnosis not present

## 2019-11-28 DIAGNOSIS — F17210 Nicotine dependence, cigarettes, uncomplicated: Secondary | ICD-10-CM | POA: Insufficient documentation

## 2019-11-28 DIAGNOSIS — N5089 Other specified disorders of the male genital organs: Secondary | ICD-10-CM | POA: Insufficient documentation

## 2019-11-28 DIAGNOSIS — R1030 Lower abdominal pain, unspecified: Secondary | ICD-10-CM | POA: Diagnosis not present

## 2019-11-28 LAB — CBC WITH DIFFERENTIAL/PLATELET
Abs Immature Granulocytes: 0.03 10*3/uL (ref 0.00–0.07)
Basophils Absolute: 0.1 10*3/uL (ref 0.0–0.1)
Basophils Relative: 1 %
Eosinophils Absolute: 0.3 10*3/uL (ref 0.0–0.5)
Eosinophils Relative: 3 %
HCT: 45.5 % (ref 39.0–52.0)
Hemoglobin: 15.2 g/dL (ref 13.0–17.0)
Immature Granulocytes: 0 %
Lymphocytes Relative: 10 %
Lymphs Abs: 1 10*3/uL (ref 0.7–4.0)
MCH: 29.2 pg (ref 26.0–34.0)
MCHC: 33.4 g/dL (ref 30.0–36.0)
MCV: 87.5 fL (ref 80.0–100.0)
Monocytes Absolute: 0.6 10*3/uL (ref 0.1–1.0)
Monocytes Relative: 6 %
Neutro Abs: 7.7 10*3/uL (ref 1.7–7.7)
Neutrophils Relative %: 80 %
Platelets: 190 10*3/uL (ref 150–400)
RBC: 5.2 MIL/uL (ref 4.22–5.81)
RDW: 12.9 % (ref 11.5–15.5)
WBC: 9.6 10*3/uL (ref 4.0–10.5)
nRBC: 0 % (ref 0.0–0.2)

## 2019-11-28 LAB — BASIC METABOLIC PANEL
Anion gap: 8 (ref 5–15)
BUN: 12 mg/dL (ref 6–20)
CO2: 25 mmol/L (ref 22–32)
Calcium: 8.9 mg/dL (ref 8.9–10.3)
Chloride: 103 mmol/L (ref 98–111)
Creatinine, Ser: 0.81 mg/dL (ref 0.61–1.24)
GFR calc Af Amer: >60 mL/min (ref >60)
GFR calc non Af Amer: >60 mL/min (ref >60)
Glucose, Bld: 115 mg/dL — ABNORMAL HIGH (ref 70–99)
Potassium: 3.7 mmol/L (ref 3.5–5.1)
Sodium: 136 mmol/L (ref 135–145)

## 2019-11-28 IMAGING — CT CT PELVIS W/ CM
2 of 4 series · 17 of 46 positions shown, 19 images · IV contrast (Omnipaque or Isovue)
Comparison: [DATE]

CLINICAL DATA: Left groin pain and swelling following a penile
implant placed on [DATE] [place] with progressive worsening
of the pain.

EXAM:
CT PELVIS WITH CONTRAST
TECHNIQUE: Multidetector CT imaging of the pelvis was performed using the
standard protocol following the bolus administration of intravenous
contrast.
CONTRAST:  100mL OMNIPAQUE IOHEXOL 300 MG/ML  SOLN

[Series 2: axial st · axial · 0.71mm/px · z∈[+696,+1016]mm · 14 of 74 slices shown, 16 images]
[im 5/74  soft-tissue]
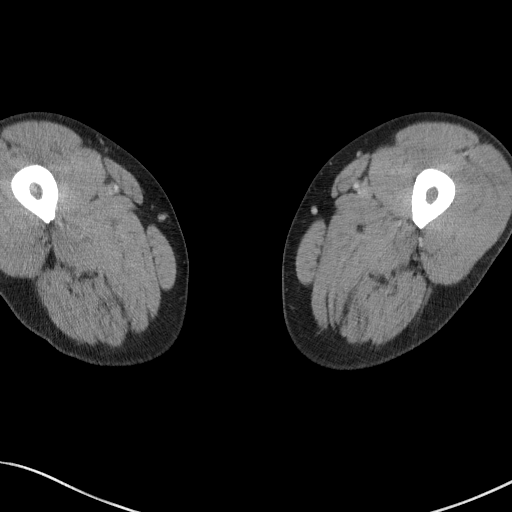
[im 5/74  bone]
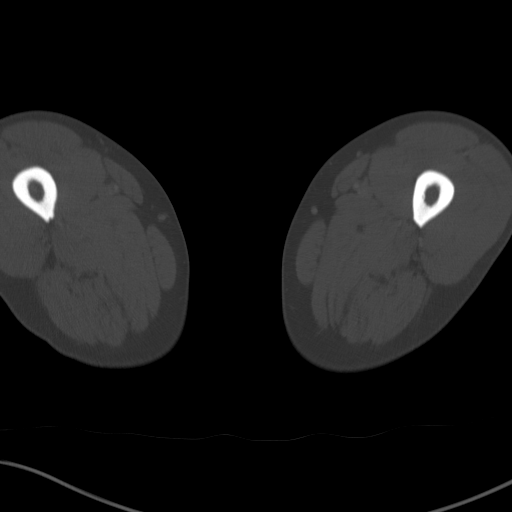
[im 10/74  soft-tissue]
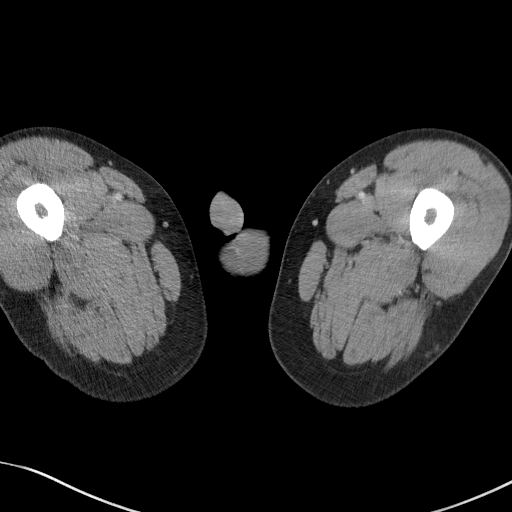
[im 15/74  soft-tissue]
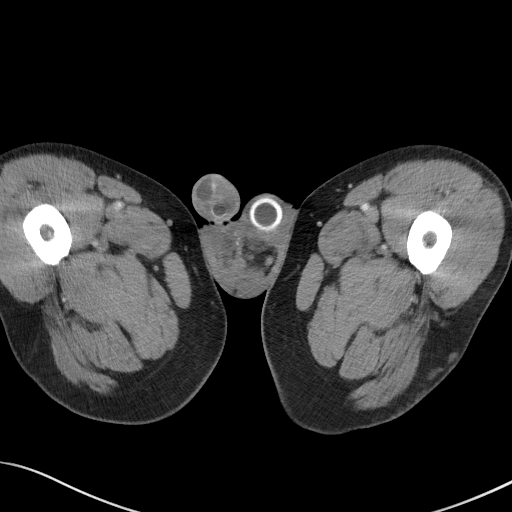
[im 20/74  soft-tissue]
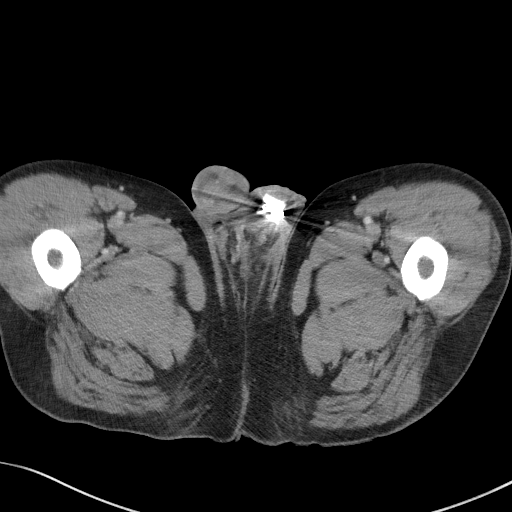
[im 25/74  soft-tissue]
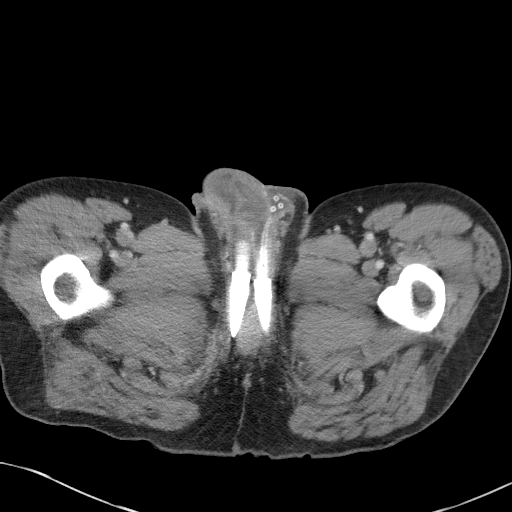
[im 30/74  soft-tissue]
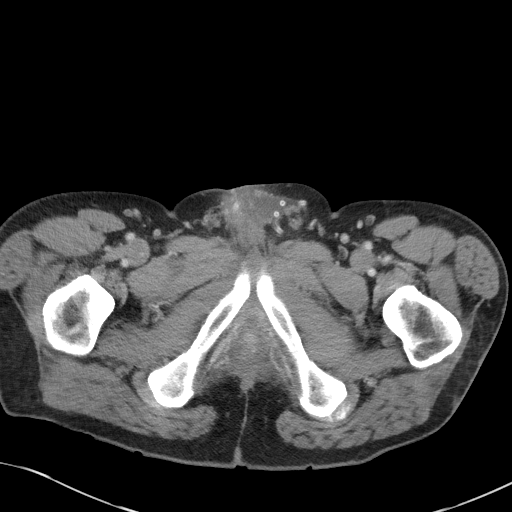
[im 35/74  soft-tissue]
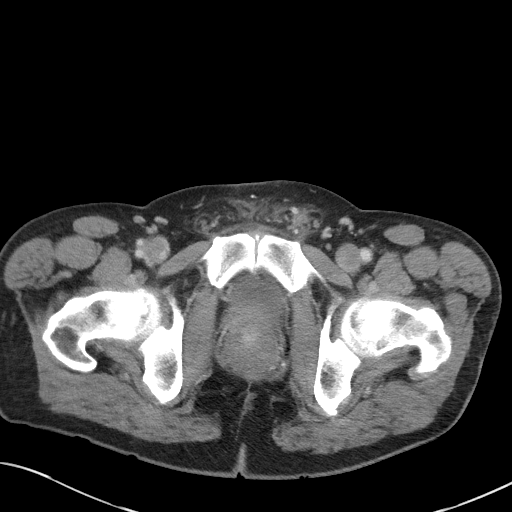
[im 39/74  soft-tissue]
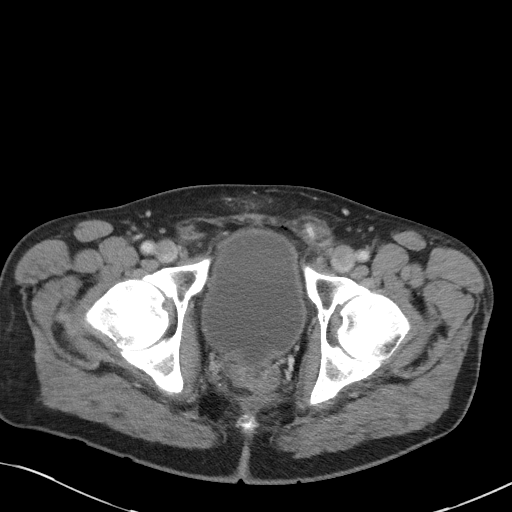
[im 44/74  soft-tissue]
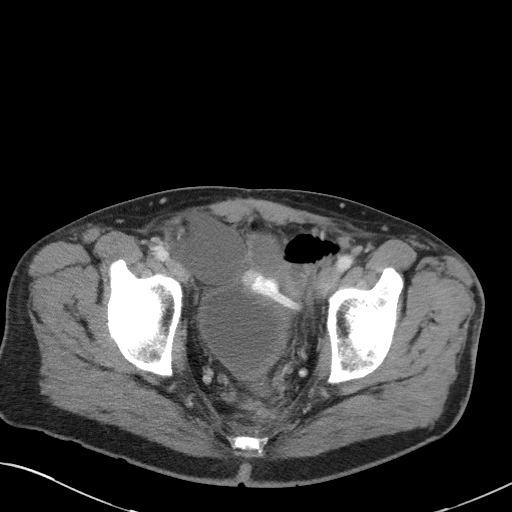
[im 44/74  bone]
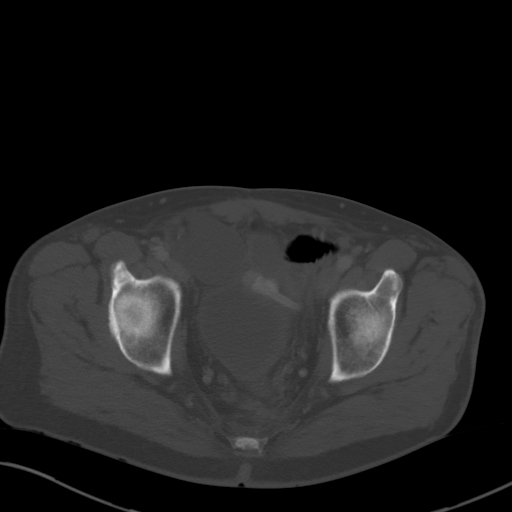
[im 49/74  soft-tissue]
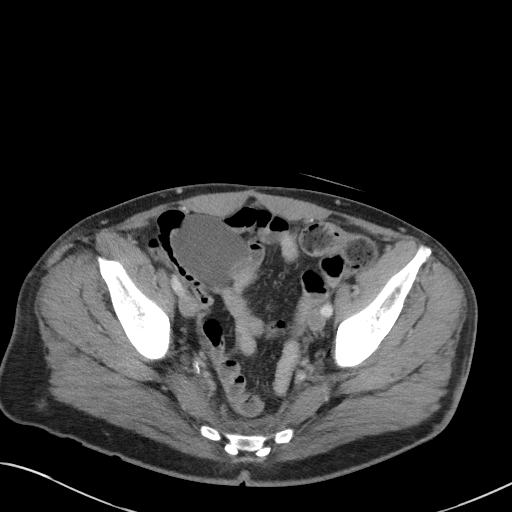
[im 54/74  soft-tissue]
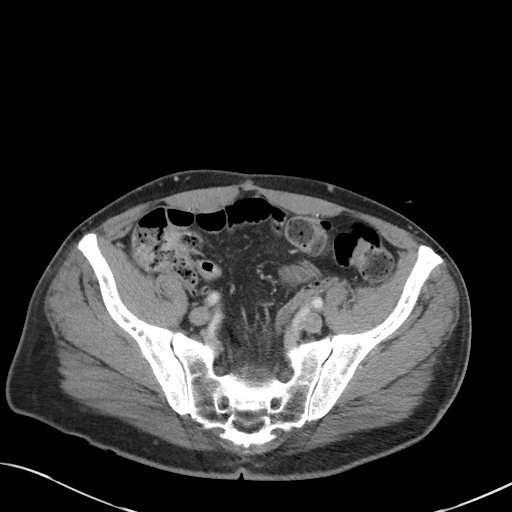
[im 59/74  soft-tissue]
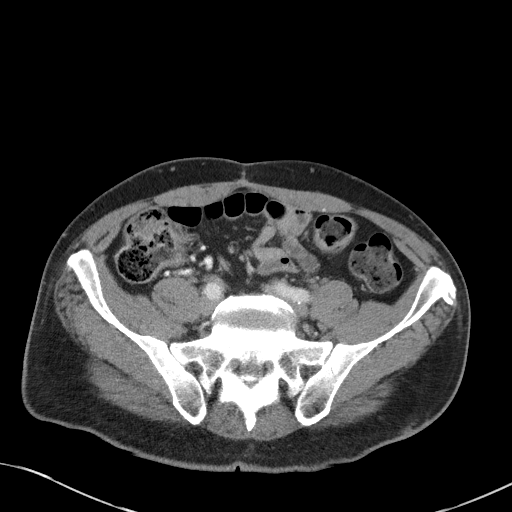
[im 64/74  soft-tissue]
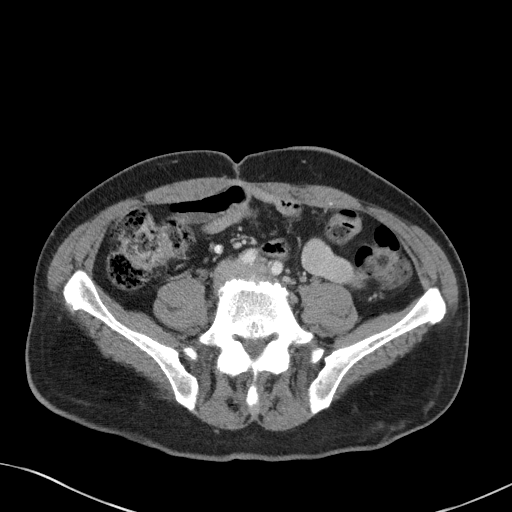
[im 69/74  soft-tissue]
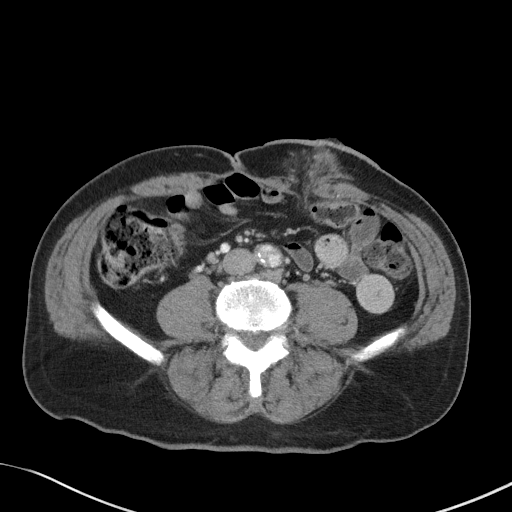

[Series 6: coronal st · coronal · 0.68mm/px · 3 of 92 slices shown]
[im 31/92  soft-tissue]
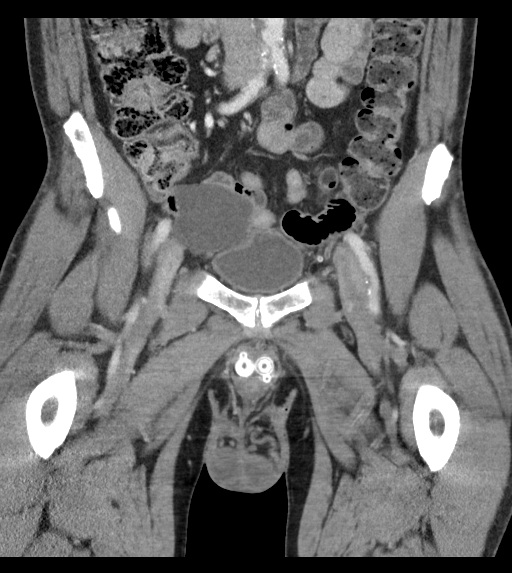
[im 41/92  soft-tissue]
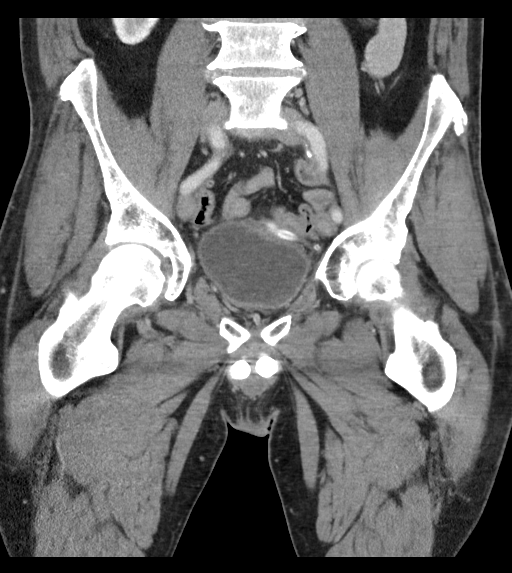
[im 51/92  soft-tissue]
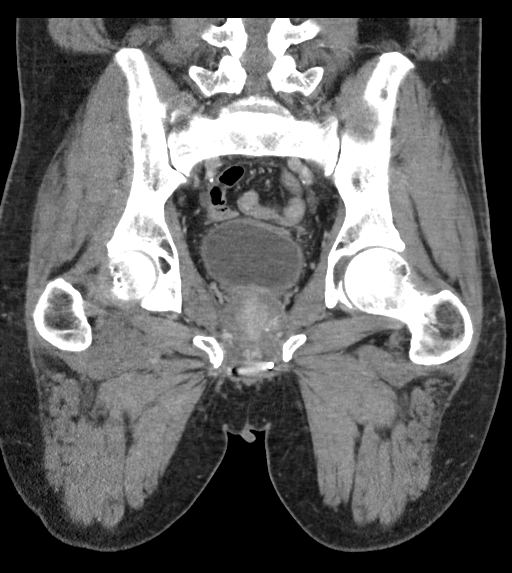

[17 of 46 positions shown; findings below may reference images not displayed]

FINDINGS: Urinary Tract:  No abnormality visualized.

Bowel: Unremarkable pelvic loops of colon, small bowel and appendix.

Vascular/Lymphatic: Atheromatous arterial plaquing calcifications.
No enlarged lymph nodes.

Reproductive: Penile implant with the reservoir or in the anterior
aspect of the right lower pelvis and pump in the upper left scrotum.
There is some fluid surrounding pump with a thin surrounding
enhancing rim, extending superiorly and surrounding the pump tubing
at the base of the penis.

Other:  Previously demonstrated left anterior pelvic colostomy.

Musculoskeletal: Lower lumbar spine degenerative changes.
IMPRESSION: 1. Penile implant with the reservoir or in the anterior aspect of
the right lower pelvis and pump in the upper left scrotum.
2. Small amount of fluid surrounding the pump in the upper left
scrotum with a thin surrounding enhancing rim, extending superiorly
and surrounding the pump tubing at the base of the penis. This could
represent a seroma or developing abscess.

## 2019-11-28 MED ORDER — IOHEXOL 300 MG/ML  SOLN
100.0000 mL | Freq: Once | INTRAMUSCULAR | Status: AC | PRN
  Administered 2019-11-28: 100 mL via INTRAVENOUS

## 2019-11-28 MED ORDER — HYDROCODONE-ACETAMINOPHEN 5-325 MG PO TABS: 1.0000 | ORAL_TABLET | Freq: Four times a day (QID) | ORAL | 0 refills | Status: AC | PRN

## 2019-11-28 MED ORDER — OXYCODONE-ACETAMINOPHEN 5-325 MG PO TABS
1.0000 | ORAL_TABLET | Freq: Once | ORAL | Status: AC
  Administered 2019-11-28: 11:00:00 1 via ORAL
  Filled 2019-11-28: qty 1

## 2019-11-28 NOTE — ED Triage Notes (Signed)
Patient presents to the ED with painful left groin pain from a penile implant February 26 at a medical center in [place].  Patient feels like the pain has progressively gotten worse since the day of the surgery.

## 2019-11-28 NOTE — Discharge Instructions (Signed)
Your work-up was reassuring.  We believe your pain is due to the normal healing process.  Call your surgeon tomorrow to discuss your visit today.  We will prescribe you some pain medication.  If you have any new or worsening symptoms, specifically fever then return for reevaluation.

## 2019-11-28 NOTE — ED Provider Notes (Signed)
[facility] EMERGENCY DEPARTMENT Provider Note   CSN: [id number] Arrival date & time: 11/28/19  [id number]     History Chief Complaint  Patient presents with  . Groin Pain    left     Clinton Ross is a 54 y.o. male.  Patient is a 54 year old gentleman with past medical history of acute pancreatitis, colon cancer, PTSD, hypertension with recent penile implant placed on November 19, 2019.  Patient reports that since Thursday he has felt increased pain in the left side of his groin and testicle where the implant was placed.  Reports that his follow-up appointment with his surgeon is not until later this month.  Surgery was done about 3 hours away in [place].  Reports that he had run out of his pain medication on Thursday and the pain seemed to increase then.  He also had to chase after his dog yesterday which may have exacerbated the pain.  He is urinating fine.  Denies any fever or chills.  Reports that the initial bruising from surgery has actually improved.        Past Medical History:  Diagnosis Date  . Acute pancreatitis 10/2013  . Arthritis   . Bulging disc   . Cancer [facility])    Colon  . Chronic knee pain   . Colostomy in place [facility])   . Gallstones   . Hypertension   . PTSD (post-traumatic stress disorder)   . Sciatica     Patient Active Problem List   Diagnosis Date Noted  . Pancreatitis 06/08/2018  . Chest pain 07/21/2016  . Cholelithiasis 11/28/2013  . Tobacco abuse 11/28/2013  . Abdominal pain 11/27/2013  . Acute pancreatitis 11/20/2013  . Constipation 11/20/2013  . HTN (hypertension), benign 11/20/2013  . PTSD (post-traumatic stress disorder) 11/20/2013  . Sciatica 05/24/2009  . DERANGEMENT MENISCUS 10/24/2008  . JOINT EFFUSION, KNEE 10/24/2008  . KNEE PAIN 10/24/2008    Past Surgical History:  Procedure Laterality Date  . CHOLECYSTECTOMY    . COLON SURGERY    . rectal cancer Right        Family History  Problem Relation Age of Onset  . Hypertension  Mother   . Stroke Father   . Diabetes Other     Social History   Tobacco Use  . Smoking status: Current Every Day Smoker    Packs/day: 0.50    Years: 30.00    Pack years: 15.00    Types: Cigarettes  . Smokeless tobacco: Never Used  Substance Use Topics  . Alcohol use: No  . Drug use: No    Home Medications Prior to Admission medications   Medication Sig Start Date End Date Taking? Authorizing Provider  amLODipine (NORVASC) 5 MG tablet Take 1 tablet (5 mg total) by mouth daily. Patient taking differently: Take 10 mg by mouth daily.  06/11/18  Yes Kathie Dike, MD  aspirin 81 MG chewable tablet Chew 1 tablet (81 mg total) by mouth daily. Patient taking differently: Chew 81 mg by mouth 3 (three) times a week.  02/23/18  Yes Julianne Rice, MD  nitroGLYCERIN (NITROSTAT) 0.4 MG SL tablet Place 1 tablet (0.4 mg total) under the tongue every 5 (five) minutes as needed for chest pain. 02/23/18  Yes Julianne Rice, MD  HYDROcodone-acetaminophen (NORCO/VICODIN) 5-325 MG tablet Take 1 tablet by mouth every 6 (six) hours as needed for up to 2 days for severe pain. 11/28/19 11/30/19  Alveria Apley, PA-C  predniSONE (STERAPRED UNI-PAK 21 TAB) 10 MG (21) TBPK  tablet Take by mouth daily. Take 6 tabs by mouth daily  for 2 days, then 5 tabs for 2 days, then 4 tabs for 2 days, then 3 tabs for 2 days, 2 tabs for 2 days, then 1 tab by mouth daily for 2 days Patient not taking: Reported on 11/28/2019 10/12/19   Stacey Drain, [place], PA-C    Allergies    Peanut-containing drug products and Morphine and related  Review of Systems   Review of Systems  Constitutional: Negative for chills and fever.  Respiratory: Negative for cough and shortness of breath.   Gastrointestinal: Negative for abdominal pain.  Genitourinary: Positive for penile pain, penile swelling, scrotal swelling and testicular pain. Negative for decreased urine volume, difficulty urinating, discharge, dysuria, flank pain, frequency, genital  sores, hematuria and urgency.  Skin: Negative for rash and wound.    Physical Exam Updated Vital Signs BP 110/87 (BP Location: Right Arm)   Pulse 83   Temp 97.9 F (36.6 C) (Oral)   Resp 16   Ht 5\' 10"  (1.778 m)   Wt 81.6 kg   SpO2 99%   BMI 25.83 kg/m   Physical Exam Vitals and nursing note reviewed. Exam conducted with a chaperone present.  Constitutional:      General: He is not in acute distress.    Appearance: Normal appearance. He is not ill-appearing, toxic-appearing or diaphoretic.  HENT:     Head: Normocephalic.     Mouth/Throat:     Mouth: Mucous membranes are moist.  Eyes:     Conjunctiva/sclera: Conjunctivae normal.  Pulmonary:     Effort: Pulmonary effort is normal.  Genitourinary:      Comments: Healing surgical scar over the pubic bone. Mild swelling and TTP lateral left over the pubic bone. TTP and swelling to the L scrotum with palpable firm mass above the testicle. Small 1cm area of ecchymosis over the distal shaft of the penis. No penile discharge Skin:    General: Skin is dry.  Neurological:     Mental Status: He is alert.     Gait: Gait abnormal (favoring the right side).  Psychiatric:        Mood and Affect: Mood normal.     ED Results / Procedures / Treatments   Labs (all labs ordered are listed, but only abnormal results are displayed) Labs Reviewed  BASIC METABOLIC PANEL - Abnormal; Notable for the following components:      Result Value   Glucose, Bld 115 (*)    All other components within normal limits  CBC WITH DIFFERENTIAL/PLATELET    EKG None  Radiology CT PELVIS W CONTRAST  Result Date: 11/28/2019 CLINICAL DATA:  Left groin pain and swelling following a penile implant placed on 11/19/2019 in [place] with progressive worsening of the pain. EXAM: CT PELVIS WITH CONTRAST TECHNIQUE: Multidetector CT imaging of the pelvis was performed using the standard protocol following the bolus administration of intravenous contrast.  CONTRAST:  135mL OMNIPAQUE IOHEXOL 300 MG/ML  SOLN COMPARISON:  03/04/2019 FINDINGS: Urinary Tract:  No abnormality visualized. Bowel: Unremarkable pelvic loops of colon, small bowel and appendix. Vascular/Lymphatic: Atheromatous arterial plaquing calcifications. No enlarged lymph nodes. Reproductive: Penile implant with the reservoir or in the anterior aspect of the right lower pelvis and pump in the upper left scrotum. There is some fluid surrounding pump with a thin surrounding enhancing rim, extending superiorly and surrounding the pump tubing at the base of the penis. Other:  Previously demonstrated left anterior pelvic colostomy. Musculoskeletal: Lower lumbar  spine degenerative changes. IMPRESSION: 1. Penile implant with the reservoir or in the anterior aspect of the right lower pelvis and pump in the upper left scrotum. 2. Small amount of fluid surrounding the pump in the upper left scrotum with a thin surrounding enhancing rim, extending superiorly and surrounding the pump tubing at the base of the penis. This could represent a seroma or developing abscess. Electronically Signed   By: Claudie Revering M.D.   On: 11/28/2019 10:37    Procedures Procedures (including critical care time)  Medications Ordered in ED Medications  iohexol (OMNIPAQUE) 300 MG/ML solution 100 mL (100 mLs Intravenous Contrast Given 11/28/19 0958)  oxyCODONE-acetaminophen (PERCOCET/ROXICET) 5-325 MG per tablet 1 tablet (1 tablet Oral Given 11/28/19 1051)    ED Course  I have reviewed the triage vital signs and the nursing notes.  Pertinent labs & imaging results that were available during my care of the patient were reviewed by me and considered in my medical decision making (see chart for details).  Clinical Course as of Nov 28 1050  Sun Nov 28, 2019  0857 Patient with increased post op pain after penile implant in [place] 11/19/19. Appears well and stable, afebrile. TTP with palpable mass in L side of scrotum. Based on  physical exam, suspect normal post op pain and changes. However, will obtain CT pelvis to be sure no other complications. No suspected infection at this time,    [KM]  1045 Overall the patient's work-up is reassuring.  Patient CT and patient was reviewed by Dr. Gilford Raid who agrees with plan to treat patient's pain and have him follow-up with his surgeon.  Advised on return precautions.   [KM]    Clinical Course User Index [KM] Kristine Royal   MDM Rules/Calculators/A&P                      Based on review of vitals, medical screening exam, lab work and/or imaging, there does not appear to be an acute, emergent etiology for the patient's symptoms. Counseled pt on good return precautions and encouraged both PCP and ED follow-up as needed.  Prior to discharge, I also discussed incidental imaging findings with patient in detail and advised appropriate, recommended follow-up in detail.  Clinical Impression: 1. Post-op pain     Disposition: Discharge  Prior to providing a prescription for a controlled substance, I independently reviewed the patient's recent prescription history on the [place]. The patient had no recent or regular prescriptions and was deemed appropriate for a brief, less than 3 day prescription of narcotic for acute analgesia.  This note was prepared with assistance of Systems analyst. Occasional wrong-word or sound-a-like substitutions may have occurred due to the inherent limitations of voice recognition software.  Final Clinical Impression(s) / ED Diagnoses Final diagnoses:  Post-op pain    Rx / DC Orders ED Discharge Orders         Ordered    HYDROcodone-acetaminophen (NORCO/VICODIN) 5-325 MG tablet  Every 6 hours PRN     11/28/19 1050           Kristine Royal 11/28/19 1052    Isla Pence, MD 11/28/19 1102

## 2019-12-23 ENCOUNTER — Ambulatory Visit
Admission: EM | Admit: 2019-12-23 | Discharge: 2019-12-23 | Disposition: A | Discharge status: Home / Self Care | Payer: Medicare HMO | Attending: Emergency Medicine | Admitting: Emergency Medicine

## 2019-12-23 ENCOUNTER — Ambulatory Visit (INDEPENDENT_AMBULATORY_CARE_PROVIDER_SITE_OTHER): Payer: Medicare HMO

## 2019-12-23 ENCOUNTER — Other Ambulatory Visit: Payer: Self-pay

## 2019-12-23 DIAGNOSIS — M25562 Pain in left knee: Secondary | ICD-10-CM | POA: Diagnosis not present

## 2019-12-23 DIAGNOSIS — M25462 Effusion, left knee: Secondary | ICD-10-CM | POA: Diagnosis not present

## 2019-12-23 IMAGING — DX DG KNEE COMPLETE 4+V*L*
4 series · 4 of 4 positions shown · non-contrast
Comparison: [DATE]

CLINICAL DATA: Left knee pain since a fall last night.

EXAM:
LEFT KNEE - COMPLETE 4+ VIEW

[knee ap]
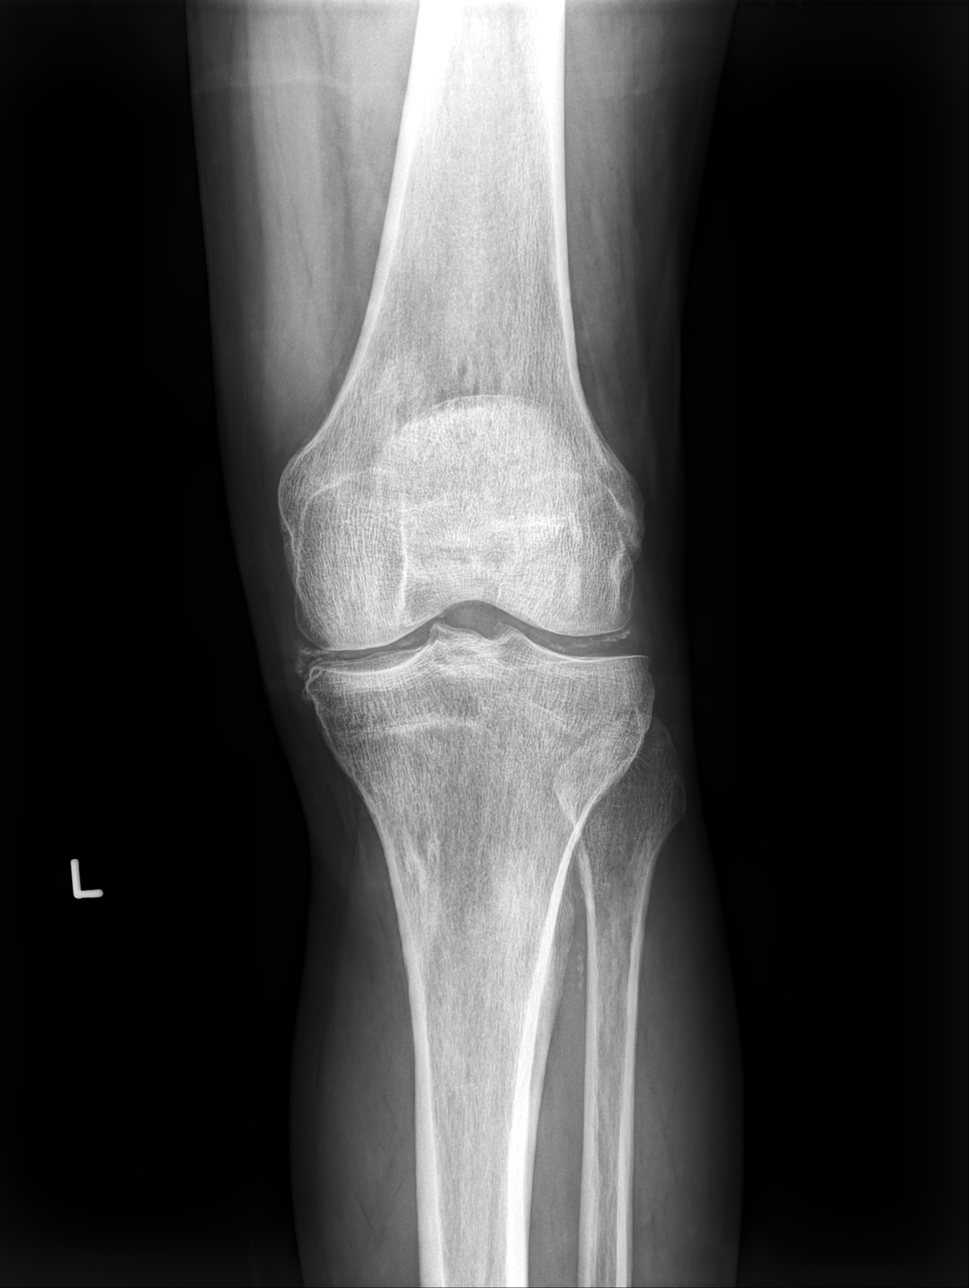

[knee mlo (1 of 2)]
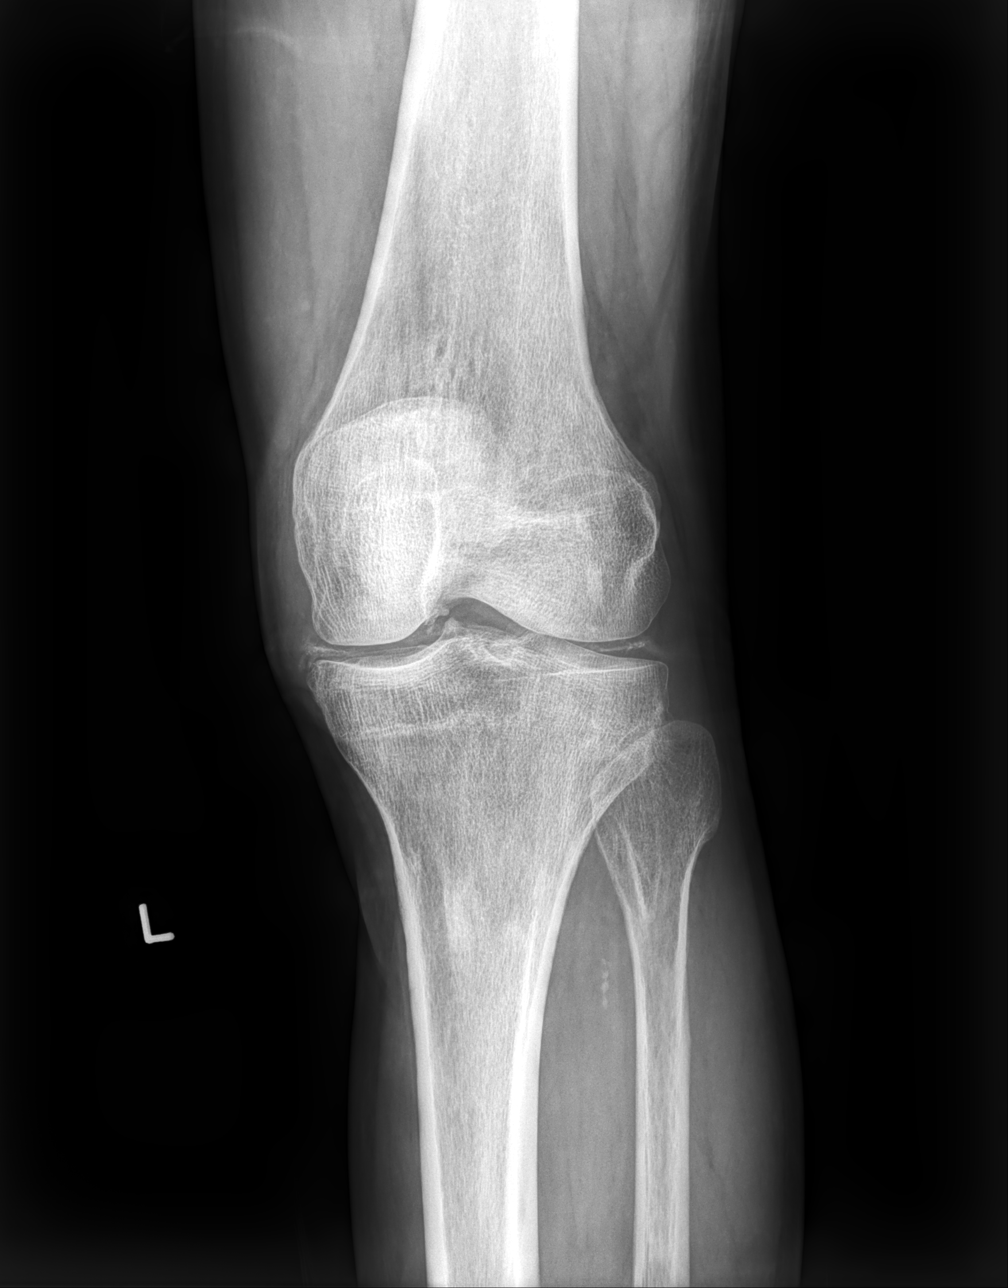

[knee mlo (2 of 2)]
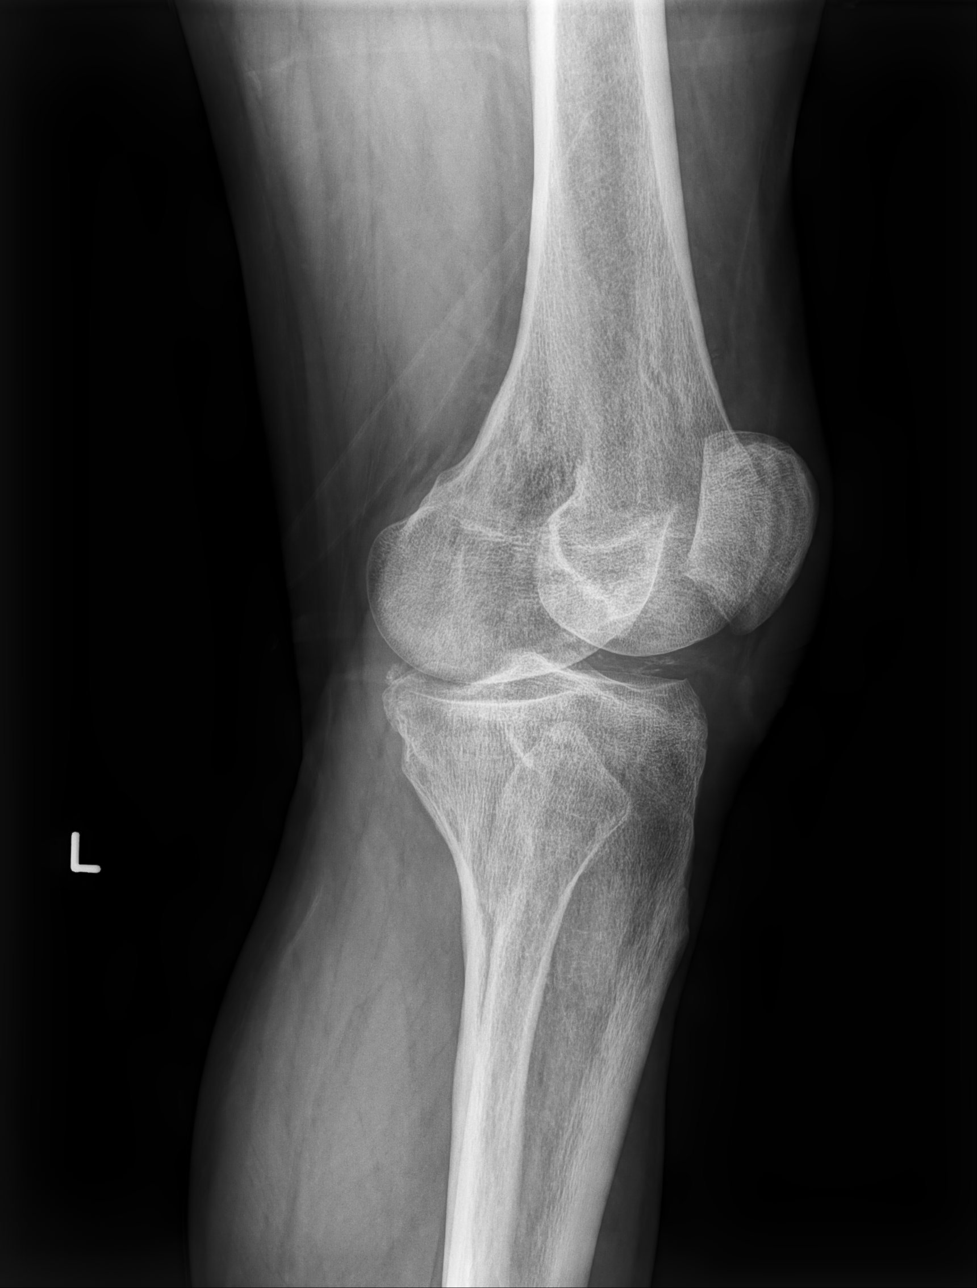

[knee lat]
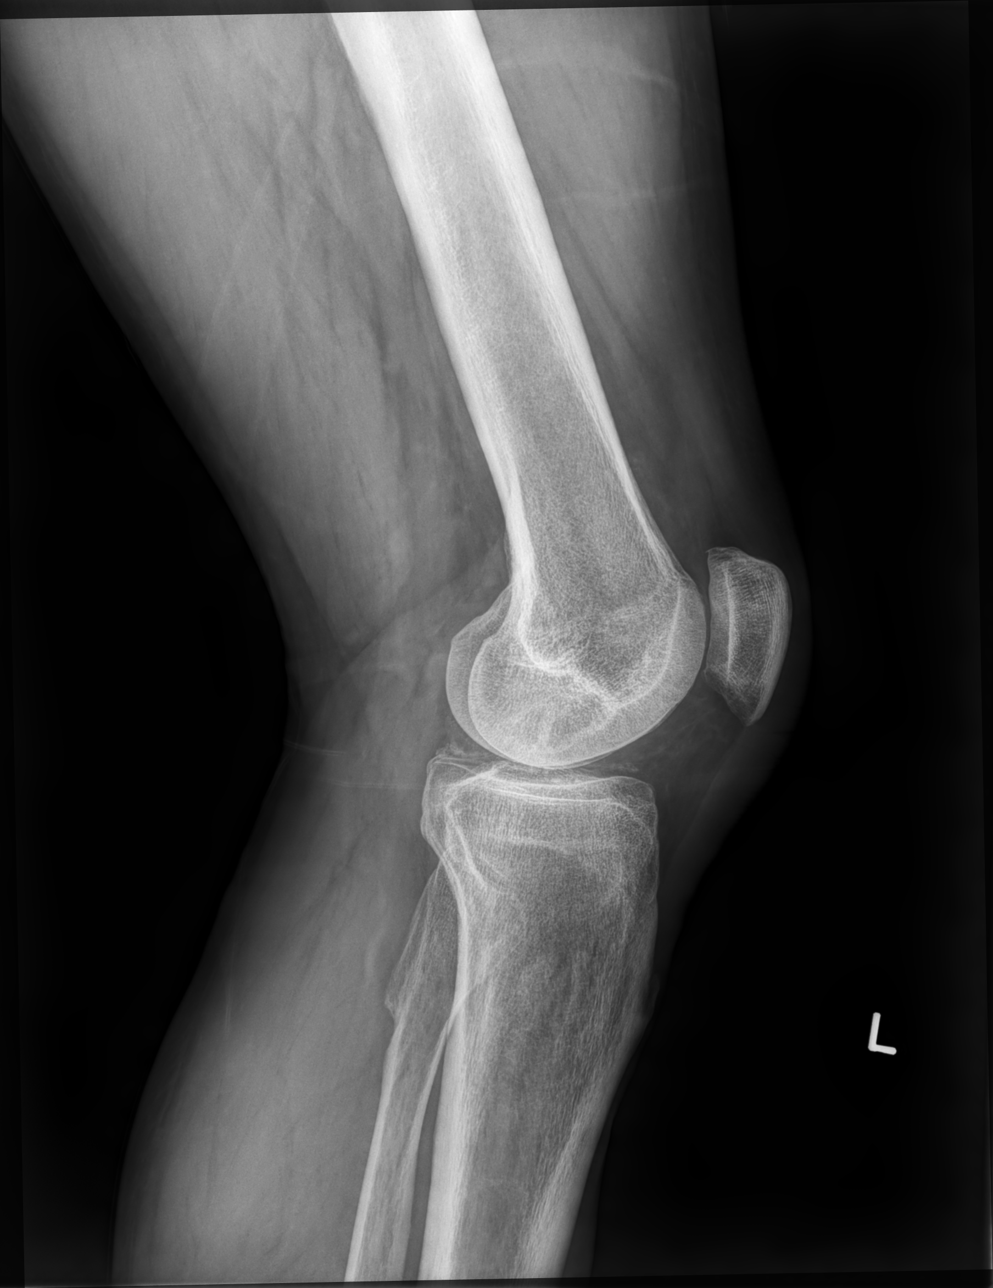

[4 of 4 positions shown; findings below may reference images not displayed]

FINDINGS: There is no fracture or dislocation. Small joint effusion. Interval
development of extensive chondrocalcinosis. Interval development of
osteopenia.
IMPRESSION: 1. No acute bone abnormality.
2. Small joint effusion.
3. Interval development of extensive chondrocalcinosis and
osteopenia.

## 2019-12-23 MED ORDER — PREDNISONE 10 MG (21) PO TBPK: ORAL_TABLET | ORAL | 0 refills | Status: DC

## 2019-12-23 MED ORDER — IBUPROFEN 800 MG PO TABS: 800.0000 mg | ORAL_TABLET | Freq: Three times a day (TID) | ORAL | 0 refills | Status: DC

## 2019-12-23 MED ORDER — METHYLPREDNISOLONE SODIUM SUCC 125 MG IJ SOLR
125.0000 mg | Freq: Once | INTRAMUSCULAR | Status: AC
  Administered 2019-12-23: 125 mg via INTRAMUSCULAR

## 2019-12-23 MED ORDER — KETOROLAC TROMETHAMINE 60 MG/2ML IM SOLN
60.0000 mg | Freq: Once | INTRAMUSCULAR | Status: AC
  Administered 2019-12-23: 11:00:00 60 mg via INTRAMUSCULAR

## 2019-12-23 NOTE — ED Triage Notes (Signed)
Pt missed step last night and injured left knee

## 2019-12-23 NOTE — ED Provider Notes (Addendum)
[facility]    CSN: [id number] Arrival date & time: 12/23/19  1015      History   Chief Complaint Chief Complaint  Patient presents with  . Knee Pain    HPI Clinton Ross is a 54 y.o. male.   Who presented to the urgent care with a complaint of left knee pain for the past 1 day.  Reported pain developed after missing step.  He localizes the pain to the left knee.  He describes the pain as constant and achy, rated at 8 on a scale of 1-10.  Has not tried any OTC medication.  His symptoms are made worse with ROM.  He denies similar symptoms in the past.  Denies chills, fever, nausea, vomiting, diarrhea, chest pain, chest tightness.   The history is provided by the patient. No language interpreter was used.    Past Medical History:  Diagnosis Date  . Acute pancreatitis 10/2013  . Arthritis   . Bulging disc   . Cancer [facility])    Colon  . Chronic knee pain   . Colostomy in place [facility])   . Gallstones   . Hypertension   . PTSD (post-traumatic stress disorder)   . Sciatica     Patient Active Problem List   Diagnosis Date Noted  . Pancreatitis 06/08/2018  . Chest pain 07/21/2016  . Cholelithiasis 11/28/2013  . Tobacco abuse 11/28/2013  . Abdominal pain 11/27/2013  . Acute pancreatitis 11/20/2013  . Constipation 11/20/2013  . HTN (hypertension), benign 11/20/2013  . PTSD (post-traumatic stress disorder) 11/20/2013  . Sciatica 05/24/2009  . DERANGEMENT MENISCUS 10/24/2008  . JOINT EFFUSION, KNEE 10/24/2008  . KNEE PAIN 10/24/2008    Past Surgical History:  Procedure Laterality Date  . CHOLECYSTECTOMY    . COLON SURGERY    . rectal cancer Right        Home Medications    Prior to Admission medications   Medication Sig Start Date End Date Taking? Authorizing Provider  amLODipine (NORVASC) 5 MG tablet Take 1 tablet (5 mg total) by mouth daily. Patient taking differently: Take 10 mg by mouth daily.  06/11/18   Kathie Dike, MD  aspirin 81 MG chewable  tablet Chew 1 tablet (81 mg total) by mouth daily. Patient taking differently: Chew 81 mg by mouth 3 (three) times a week.  02/23/18   Julianne Rice, MD  ibuprofen (ADVIL) 800 MG tablet Take 1 tablet (800 mg total) by mouth 3 (three) times daily. 12/23/19   Emiah Pellicano, Darrelyn Hillock, FNP  nitroGLYCERIN (NITROSTAT) 0.4 MG SL tablet Place 1 tablet (0.4 mg total) under the tongue every 5 (five) minutes as needed for chest pain. 02/23/18   Julianne Rice, MD  predniSONE (STERAPRED UNI-PAK 21 TAB) 10 MG (21) TBPK tablet Take 6 tabs by mouth daily  for 2 days, then 5 tabs for 2 days, then 4 tabs for 2 days, then 3 tabs for 2 days, 2 tabs for 2 days, then 1 tab by mouth daily for 2 days 12/23/19   Emerson Monte, FNP    Family History Family History  Problem Relation Age of Onset  . Hypertension Mother   . Stroke Father   . Diabetes Other     Social History Social History   Tobacco Use  . Smoking status: Current Every Day Smoker    Packs/day: 0.50    Years: 30.00    Pack years: 15.00    Types: Cigarettes  . Smokeless tobacco: Never Used  Substance  Use Topics  . Alcohol use: No  . Drug use: No     Allergies   Peanut-containing drug products and Morphine and related   Review of Systems Review of Systems  Constitutional: Negative.   Respiratory: Negative.   Cardiovascular: Negative.   Musculoskeletal: Positive for arthralgias. Negative for joint swelling.  All other systems reviewed and are negative.    Physical Exam Triage Vital Signs ED Triage Vitals  Enc Vitals Group     BP      Pulse      Resp      Temp      Temp src      SpO2      Weight      Height      Head Circumference      Peak Flow      Pain Score      Pain Loc      Pain Edu?      Excl. in [place]?    No data found.  Updated Vital Signs BP 138/81   Pulse 86   Temp 98.1 F (36.7 C)   Resp 18   SpO2 98%   Visual Acuity Right Eye Distance:   Left Eye Distance:   Bilateral Distance:    Right Eye Near:    Left Eye Near:    Bilateral Near:     Physical Exam Vitals and nursing note reviewed.  Constitutional:      General: He is not in acute distress.    Appearance: Normal appearance. He is normal weight. He is not ill-appearing, toxic-appearing or diaphoretic.  Cardiovascular:     Rate and Rhythm: Normal rate and regular rhythm.     Pulses: Normal pulses.     Heart sounds: Normal heart sounds. No murmur. No friction rub. No gallop.   Pulmonary:     Effort: Pulmonary effort is normal. No respiratory distress.     Breath sounds: Normal breath sounds. No stridor. No wheezing, rhonchi or rales.  Chest:     Chest wall: No tenderness.  Musculoskeletal:        General: Tenderness present.     Right knee: Normal.     Left knee: Tenderness present.     Right lower leg: Normal. No edema.     Left lower leg: No edema.     Comments: Patient is able to partially bear weight and ambulate with pain.  No surface trauma, or obvious effusion, overlying erythema or warmth.  The left knee is without any obvious asymmetry or deformity when compared to the right knee.  Patient is unable to deep knee bend.  Unable to fully extend; unable to complete internal and external rotation.  Tenderness over the patella.  No quadricep tenderness.  Negative anterior drawer and Lachman test.  Questionable positive McMurray test.  Neurosensory intact  Neurological:     Mental Status: He is alert and oriented to person, place, and time.     Sensory: No sensory deficit.      UC Treatments / Results  Labs (all labs ordered are listed, but only abnormal results are displayed) Labs Reviewed - No data to display  EKG   Radiology DG Knee Complete 4 Views Left  Result Date: 12/23/2019 CLINICAL DATA:  Left knee pain since a fall last night. EXAM: LEFT KNEE - COMPLETE 4+ VIEW COMPARISON:  10/17/2008 FINDINGS: There is no fracture or dislocation. Small joint effusion. Interval development of extensive chondrocalcinosis.  Interval development of osteopenia. IMPRESSION: 1.  No acute bone abnormality. 2. Small joint effusion. 3. Interval development of extensive chondrocalcinosis and osteopenia. Electronically Signed   By: Lorriane Shire M.D.   On: 12/23/2019 11:02    Procedures Procedures (including critical care time)  Medications Ordered in UC Medications  methylPREDNISolone sodium succinate (SOLU-MEDROL) 125 mg/2 mL injection 125 mg (125 mg Intramuscular Given 12/23/19 1102)  ketorolac (TORADOL) injection 60 mg (60 mg Intramuscular Given 12/23/19 1102)    Initial Impression / Assessment and Plan / UC Course  I have reviewed the triage vital signs and the nursing notes.  Pertinent labs & imaging results that were available during my care of the patient were reviewed by me and considered in my medical decision making (see chart for details).     Patient is stable for discharge.  Left knee X-ray is negative for bony abnormality including fracture or dislocation.  I have reviewed the x-ray myself and the radiologist interpretation.  I am in agreement with the radiologist interpretation.  Suspected possible ligament tear. Was advised to follow-up with PCP at the [facility] to have an MRI completed. Knee sleeve was applied in office. Return or go to ED for worsening of symptoms.  Final Clinical Impressions(s) / UC Diagnoses   Final diagnoses:  Acute pain of left knee     Discharge Instructions     Advised patient to follow-up with PCP for an order for MRI to rule out knee ligament tear. Toradol and prednisone IM were given in office Ibuprofen 800 mg was prescribed Prednisone was prescribed Patient was advised to continue to take hydrocodone-acetaminophen 5 - 325 mg for severe pain Return or go to ED for worsening of symptoms    ED Prescriptions    Medication Sig Dispense Auth. Provider   ibuprofen (ADVIL) 800 MG tablet Take 1 tablet (800 mg total) by mouth 3 (three) times daily. 30 tablet Lavert Matousek  S, FNP   predniSONE (STERAPRED UNI-PAK 21 TAB) 10 MG (21) TBPK tablet Take 6 tabs by mouth daily  for 2 days, then 5 tabs for 2 days, then 4 tabs for 2 days, then 3 tabs for 2 days, 2 tabs for 2 days, then 1 tab by mouth daily for 2 days 42 tablet Luie Laneve, Darrelyn Hillock, FNP     I have reviewed the PDMP during this encounter.   Emerson Monte, FNP 12/23/19 1114    Emerson Monte, FNP 12/23/19 1115

## 2019-12-23 NOTE — Discharge Instructions (Addendum)
Advised patient to follow-up with PCP for an order for MRI to rule out knee ligament tear. Toradol and prednisone IM were given in office Ibuprofen 800 mg was prescribed Prednisone was prescribed Patient was advised to continue to take hydrocodone-acetaminophen 5 - 325 mg for severe pain Return or go to ED for worsening of symptoms

## 2020-01-31 DIAGNOSIS — Z85048 Personal history of other malignant neoplasm of rectum, rectosigmoid junction, and anus: Secondary | ICD-10-CM | POA: Diagnosis not present

## 2020-01-31 DIAGNOSIS — Z933 Colostomy status: Secondary | ICD-10-CM | POA: Diagnosis not present

## 2020-01-31 DIAGNOSIS — C20 Malignant neoplasm of rectum: Secondary | ICD-10-CM | POA: Diagnosis not present

## 2020-01-31 DIAGNOSIS — I10 Essential (primary) hypertension: Secondary | ICD-10-CM | POA: Diagnosis not present

## 2020-01-31 DIAGNOSIS — F17200 Nicotine dependence, unspecified, uncomplicated: Secondary | ICD-10-CM | POA: Diagnosis not present

## 2020-03-23 ENCOUNTER — Other Ambulatory Visit: Payer: Self-pay

## 2020-03-23 ENCOUNTER — Ambulatory Visit
Admission: EM | Admit: 2020-03-23 | Discharge: 2020-03-23 | Disposition: A | Discharge status: Home / Self Care | Payer: Medicare HMO | Attending: Emergency Medicine | Admitting: Emergency Medicine

## 2020-03-23 DIAGNOSIS — M545 Low back pain, unspecified: Secondary | ICD-10-CM

## 2020-03-23 DIAGNOSIS — T8361XA Infection and inflammatory reaction due to implanted penile prosthesis, initial encounter: Secondary | ICD-10-CM | POA: Diagnosis not present

## 2020-03-23 MED ORDER — CYCLOBENZAPRINE HCL 10 MG PO TABS: 10.0000 mg | ORAL_TABLET | Freq: Every day | ORAL | 0 refills | Status: AC

## 2020-03-23 MED ORDER — KETOROLAC TROMETHAMINE 60 MG/2ML IM SOLN
60.0000 mg | Freq: Once | INTRAMUSCULAR | Status: AC
  Administered 2020-03-23: 60 mg via INTRAMUSCULAR

## 2020-03-23 MED ORDER — DOXYCYCLINE HYCLATE 100 MG PO CAPS: 100.0000 mg | ORAL_CAPSULE | Freq: Two times a day (BID) | ORAL | 0 refills | Status: DC

## 2020-03-23 MED ORDER — PREDNISONE 20 MG PO TABS: 20.0000 mg | ORAL_TABLET | Freq: Two times a day (BID) | ORAL | 0 refills | Status: AC

## 2020-03-23 NOTE — ED Triage Notes (Signed)
Pt also concerned about penis pain from implant surgery on may 27th from [place] , states isn't healing well

## 2020-03-23 NOTE — Discharge Instructions (Signed)
Continue conservative management of rest, ice, and gentle stretches Prednisone prescribed.  Take as directed and to completion Take cyclobenzaprine at nighttime for symptomatic relief. Avoid driving or operating heavy machinery while using medication. Follow up with PCP if symptoms persist Return or go to the ER if you have any new or worsening symptoms (fever, chills, chest pain, abdominal pain, changes in bowel or bladder habits, pain radiating into lower legs, etc...)   Will cover for possible infection on penis Follow up with urologist If you experience pain, changes in urinary habits or worsening symptoms please go to the ED

## 2020-03-23 NOTE — ED Triage Notes (Signed)
Pt has c/o low back pain , pt states had h/o back pain and DJD. Began hurting last night

## 2020-03-23 NOTE — ED Provider Notes (Addendum)
[facility]   [id number] 03/23/20 Arrival Time: 9381  CC: Back pain; penile infection  SUBJECTIVE: History from: patient. SLY PARLEE is a 54 y.o. male complains of back pain x 1 day.  Denies a precipitating event or specific injury.  Localizes the pain to the LT low back.  Describes the pain as intermittent and sharp in character.  Has tried OTC medications without relief.  Symptoms are made worse with movement.  Reports similar symptoms in the past, improved with Toradol shot.  Denies fever, chills, erythema, ecchymosis, effusion, weakness, numbness and tingling, saddle paresthesias, loss of bowel or bladder function.      Patient also mentions possible penile implant infection x few days.  Had two operations for penile implant over the past couple of months.  Initially treated with bactrim, but denies improvement.  Denies oligoria, or anuria.  Complains of associated redness, and swelling.    ROS: As per HPI.  All other pertinent ROS negative.     Past Medical History:  Diagnosis Date  . Acute pancreatitis 10/2013  . Arthritis   . Bulging disc   . Cancer [facility])    Colon  . Chronic knee pain   . Colostomy in place [facility])   . Gallstones   . Hypertension   . PTSD (post-traumatic stress disorder)   . Sciatica    Past Surgical History:  Procedure Laterality Date  . CHOLECYSTECTOMY    . COLON SURGERY    . rectal cancer Right    Allergies  Allergen Reactions  . Peanut-Containing Drug Products Itching    Walnuts  . Morphine And Related     Altered mental status-burning sensation all over   No current facility-administered medications on file prior to encounter.   Current Outpatient Medications on File Prior to Encounter  Medication Sig Dispense Refill  . amLODipine (NORVASC) 5 MG tablet Take 1 tablet (5 mg total) by mouth daily. (Patient taking differently: Take 10 mg by mouth daily. ) 30 tablet 0  . aspirin 81 MG chewable tablet Chew 1 tablet (81 mg total) by mouth  daily. (Patient taking differently: Chew 81 mg by mouth 3 (three) times a week. ) 30 tablet 0  . ibuprofen (ADVIL) 800 MG tablet Take 1 tablet (800 mg total) by mouth 3 (three) times daily. 30 tablet 0  . nitroGLYCERIN (NITROSTAT) 0.4 MG SL tablet Place 1 tablet (0.4 mg total) under the tongue every 5 (five) minutes as needed for chest pain. 30 tablet 0   Social History   Socioeconomic History  . Marital status: Married    Spouse name: Not on file  . Number of children: Not on file  . Years of education: Not on file  . Highest education level: Not on file  Occupational History  . Not on file  Tobacco Use  . Smoking status: Current Every Day Smoker    Packs/day: 0.50    Years: 30.00    Pack years: 15.00    Types: Cigarettes  . Smokeless tobacco: Never Used  Vaping Use  . Vaping Use: Never assessed  Substance and Sexual Activity  . Alcohol use: No  . Drug use: No  . Sexual activity: Not on file  Other Topics Concern  . Not on file  Social History Narrative  . Not on file   Social Determinants of Health   Financial Resource Strain:   . Difficulty of Paying Living Expenses:   Food Insecurity:   . Worried About [employer] of  Food in the Last Year:   . [place] in the Last Year:   Transportation Needs:   . Film/video editor (Medical):   Marland Kitchen Lack of Transportation (Non-Medical):   Physical Activity:   . Days of Exercise per Week:   . Minutes of Exercise per Session:   Stress:   . Feeling of Stress :   Social Connections:   . Frequency of Communication with Friends and Family:   . Frequency of Social Gatherings with Friends and Family:   . Attends Religious Services:   . Active Member of Clubs or Organizations:   . Attends Archivist Meetings:   Marland Kitchen Marital Status:   Intimate Partner Violence:   . Fear of Current or Ex-Partner:   . Emotionally Abused:   Marland Kitchen Physically Abused:   . Sexually Abused:    Family History  Problem Relation Age of Onset   . Hypertension Mother   . Stroke Father   . Diabetes Other     OBJECTIVE:  Vitals:   03/23/20 1514  BP: 133/82  Pulse: 92  Resp: 20  Temp: 98.8 F (37.1 C)  SpO2: 97%    General appearance: ALERT; in no acute distress.  Head: NCAT Lungs: Normal respiratory effort; CTAB CV: RRR Musculoskeletal: Back  Inspection: Skin warm, dry, clear and intact without obvious erythema, effusion, or ecchymosis.  Palpation: TTP over distal L-spine, and LT sided paravertebral muscles ROM: FROM active and passive Strength: 5/5 shld abduction, 5/5 shld adduction, 5/5 elbow flexion, 5/5 elbow extension, 5/5 grip strength, 5/5 hip flexion, 5/5 hip extension GU: Chaperone, Amy Stone RN present.  Circumcised male; healing incision over 9-12 o'clock position of the distal shaft of the penis, TTP, surrounding erythema, no obvious drainage or bleeding Skin: warm and dry Neurologic: Ambulates without difficulty; Sensation intact about the upper/ lower extremities Psychological: alert and cooperative; normal mood and affect   ASSESSMENT & PLAN:  1. Acute left-sided low back pain without sciatica   2. Infection of penile implant, initial encounter ([place])     Meds ordered this encounter  Medications  . predniSONE (DELTASONE) 20 MG tablet    Sig: Take 1 tablet (20 mg total) by mouth 2 (two) times daily with a meal for 5 days.    Dispense:  10 tablet    Refill:  0    Order Specific Question:   Supervising Provider    Answer:   Raylene Everts [[id number]]  . cyclobenzaprine (FLEXERIL) 10 MG tablet    Sig: Take 1 tablet (10 mg total) by mouth at bedtime.    Dispense:  15 tablet    Refill:  0    Order Specific Question:   Supervising Provider    Answer:   Raylene Everts [[id number]]  . ketorolac (TORADOL) injection 60 mg  . doxycycline (VIBRAMYCIN) 100 MG capsule    Sig: Take 1 capsule (100 mg total) by mouth 2 (two) times daily.    Dispense:  20 capsule    Refill:  0    Order Specific Question:    Supervising Provider    Answer:   Raylene Everts [[id number]]    Continue conservative management of rest, ice, and gentle stretches Prednisone prescribed.  Take as directed and to completion Take cyclobenzaprine at nighttime for symptomatic relief. Avoid driving or operating heavy machinery while using medication. Follow up with PCP if symptoms persist Return or go to the ER if you have any new or worsening symptoms (fever,  chills, chest pain, abdominal pain, changes in bowel or bladder habits, pain radiating into lower legs, etc...)   Will cover for possible infection on penis Follow up with urologist If you experience pain, changes in urinary habits or worsening symptoms please go to the ED  Reviewed expectations re: course of current medical issues. Questions answered. Outlined signs and symptoms indicating need for more acute intervention. Patient verbalized understanding. After Visit Summary given.    Lestine Box, PA-C 03/23/20 [place], [place], [place] 03/23/20 1541

## 2020-03-26 ENCOUNTER — Other Ambulatory Visit: Payer: Self-pay

## 2020-03-26 ENCOUNTER — Encounter (HOSPITAL_COMMUNITY): Payer: Self-pay | Admitting: *Deleted

## 2020-03-26 ENCOUNTER — Emergency Department (HOSPITAL_COMMUNITY)
Admission: EM | Admit: 2020-03-26 | Discharge: 2020-03-26 | Disposition: A | Discharge status: Home / Self Care | Payer: No Typology Code available for payment source | Attending: Emergency Medicine | Admitting: Emergency Medicine

## 2020-03-26 DIAGNOSIS — F17210 Nicotine dependence, cigarettes, uncomplicated: Secondary | ICD-10-CM | POA: Insufficient documentation

## 2020-03-26 DIAGNOSIS — I10 Essential (primary) hypertension: Secondary | ICD-10-CM | POA: Diagnosis not present

## 2020-03-26 DIAGNOSIS — M5442 Lumbago with sciatica, left side: Secondary | ICD-10-CM | POA: Diagnosis not present

## 2020-03-26 DIAGNOSIS — Z7982 Long term (current) use of aspirin: Secondary | ICD-10-CM | POA: Diagnosis not present

## 2020-03-26 DIAGNOSIS — Z79899 Other long term (current) drug therapy: Secondary | ICD-10-CM | POA: Diagnosis not present

## 2020-03-26 DIAGNOSIS — Z85048 Personal history of other malignant neoplasm of rectum, rectosigmoid junction, and anus: Secondary | ICD-10-CM | POA: Insufficient documentation

## 2020-03-26 DIAGNOSIS — M545 Low back pain: Secondary | ICD-10-CM | POA: Diagnosis present

## 2020-03-26 MED ORDER — HYDROMORPHONE HCL 1 MG/ML IJ SOLN
1.0000 mg | Freq: Once | INTRAMUSCULAR | Status: AC
  Administered 2020-03-26: 1 mg via INTRAMUSCULAR
  Filled 2020-03-26: qty 1

## 2020-03-26 NOTE — ED Provider Notes (Signed)
[facility] EMERGENCY DEPARTMENT Provider Note   CSN: [id number] Arrival date & time: 03/26/20  1136     History Chief Complaint  Patient presents with  . Back Pain    lower    Clinton Ross is a 54 y.o. male.  HPI      Clinton Ross is a 54 y.o. male who presents to the Emergency Department complaining of left-sided low back pain for 3 days.  He states this is a reoccurring problem for him which he states occurs from a "bulging disc" in his lower back.  He states that he was walking down the steps and when he sat down he noticed a sharp pain to his left lower back that occasionally radiates into his left thigh to the level of his knee to mid calf.  Pain is worse with walking and standing, improves at rest.  He was seen at the local urgent care 3 days ago for similar symptoms.  He was prescribed prednisone and cyclobenzaprine which seem to be helping his symptoms until this morning when the pain became worse.  He denies fall, fever, chills, abdominal pain, urine or bowel changes, saddle anesthesias,  Numbness or weakness of his lower extremities.    Past Medical History:  Diagnosis Date  . Acute pancreatitis 10/2013  . Arthritis   . Bulging disc   . Cancer [facility])    Colon  . Chronic knee pain   . Colostomy in place [facility])   . Gallstones   . Hypertension   . PTSD (post-traumatic stress disorder)   . Sciatica     Patient Active Problem List   Diagnosis Date Noted  . Pancreatitis 06/08/2018  . Chest pain 07/21/2016  . Cholelithiasis 11/28/2013  . Tobacco abuse 11/28/2013  . Abdominal pain 11/27/2013  . Acute pancreatitis 11/20/2013  . Constipation 11/20/2013  . HTN (hypertension), benign 11/20/2013  . PTSD (post-traumatic stress disorder) 11/20/2013  . Sciatica 05/24/2009  . DERANGEMENT MENISCUS 10/24/2008  . JOINT EFFUSION, KNEE 10/24/2008  . KNEE PAIN 10/24/2008    Past Surgical History:  Procedure Laterality Date  . CHOLECYSTECTOMY    . COLON SURGERY    .  rectal cancer Right        Family History  Problem Relation Age of Onset  . Hypertension Mother   . Stroke Father   . Diabetes Other     Social History   Tobacco Use  . Smoking status: Current Every Day Smoker    Packs/day: 0.50    Years: 30.00    Pack years: 15.00    Types: Cigarettes  . Smokeless tobacco: Never Used  Vaping Use  . Vaping Use: Never assessed  Substance Use Topics  . Alcohol use: No  . Drug use: No    Home Medications Prior to Admission medications   Medication Sig Start Date End Date Taking? Authorizing Provider  amLODipine (NORVASC) 5 MG tablet Take 1 tablet (5 mg total) by mouth daily. Patient taking differently: Take 10 mg by mouth daily.  06/11/18   Kathie Dike, MD  aspirin 81 MG chewable tablet Chew 1 tablet (81 mg total) by mouth daily. Patient taking differently: Chew 81 mg by mouth 3 (three) times a week.  02/23/18   Julianne Rice, MD  cyclobenzaprine (FLEXERIL) 10 MG tablet Take 1 tablet (10 mg total) by mouth at bedtime. 03/23/20   Wurst, [place], PA-C  doxycycline (VIBRAMYCIN) 100 MG capsule Take 1 capsule (100 mg total) by mouth 2 (two) times daily.  03/23/20   Wurst, [place], PA-C  ibuprofen (ADVIL) 800 MG tablet Take 1 tablet (800 mg total) by mouth 3 (three) times daily. 12/23/19   Avegno, Darrelyn Hillock, FNP  nitroGLYCERIN (NITROSTAT) 0.4 MG SL tablet Place 1 tablet (0.4 mg total) under the tongue every 5 (five) minutes as needed for chest pain. 02/23/18   Julianne Rice, MD  predniSONE (DELTASONE) 20 MG tablet Take 1 tablet (20 mg total) by mouth 2 (two) times daily with a meal for 5 days. 03/23/20 03/28/20  Lestine Box, PA-C    Allergies    Peanut-containing drug products and Morphine and related  Review of Systems   Review of Systems  Constitutional: Negative for chills and fever.  Respiratory: Negative for shortness of breath.   Cardiovascular: Negative for chest pain.  Gastrointestinal: Negative for abdominal pain, constipation,  diarrhea, nausea and vomiting.  Genitourinary: Negative for decreased urine volume, difficulty urinating, dysuria, flank pain and hematuria.  Musculoskeletal: Positive for back pain. Negative for joint swelling.  Skin: Negative for rash.  Neurological: Negative for weakness and numbness.    Physical Exam Updated Vital Signs BP (!) 156/98 (BP Location: Right Arm)   Pulse 74   Temp 97.7 F (36.5 C) (Oral)   Resp 18   Ht 5\' 10"  (1.778 m)   Wt 78 kg   SpO2 99%   BMI 24.68 kg/m   Physical Exam Vitals and nursing note reviewed.  Constitutional:      General: He is not in acute distress.    Appearance: Normal appearance. He is well-developed.  HENT:     Head: Normocephalic and atraumatic.  Cardiovascular:     Rate and Rhythm: Normal rate and regular rhythm.     Comments: DP pulses are strong and palpable bilaterally Pulmonary:     Effort: Pulmonary effort is normal. No respiratory distress.     Breath sounds: Normal breath sounds.  Abdominal:     General: There is no distension.     Palpations: Abdomen is soft.     Tenderness: There is no abdominal tenderness.     Comments: Colostomy bag present  Musculoskeletal:        General: Tenderness present.     Cervical back: Normal range of motion and neck supple.     Lumbar back: Tenderness present. No swelling, deformity or lacerations. Normal range of motion.     Comments: ttp of the left lumbar spine and left paraspinal muscles.    Pt has 5/5 strength against resistance of bilateral lower extremities.  Hip flexors and extensors are intact   Skin:    General: Skin is warm and dry.     Capillary Refill: Capillary refill takes less than 2 seconds.     Findings: No rash.  Neurological:     General: No focal deficit present.     Mental Status: He is alert and oriented to person, place, and time.     Sensory: No sensory deficit.     Motor: No weakness or abnormal muscle tone.     Gait: Gait normal.     Deep Tendon Reflexes:      Reflex Scores:      Patellar reflexes are 2+ on the right side and 2+ on the left side.      Achilles reflexes are 2+ on the right side and 2+ on the left side.    ED Results / Procedures / Treatments   Labs (all labs ordered are listed, but only abnormal results are displayed)  Labs Reviewed - No data to display  EKG None  Radiology No results found.  Procedures Procedures (including critical care time)  Medications Ordered in ED Medications  HYDROmorphone (DILAUDID) injection 1 mg (has no administration in time range)    ED Course  I have reviewed the triage vital signs and the nursing notes.  Pertinent labs & imaging results that were available during my care of the patient were reviewed by me and considered in my medical decision making (see chart for details).    MDM Rules/Calculators/A&P                          Patient here with likely acute on chronic low back pain and left sciatica.  No concerning symptoms for cauda equina or emergent process.  He is ambulatory with a slow but steady gait.  No focal neuro deficits on exam.  Patient has an upcoming refill on his narcotic pain medication, so I do not feel that additional prescriptions are indicated at this time.  Vital signs reviewed and reassuring.  He has cyclobenzaprine at home and is currently taking prednisone.  I feel that he is appropriate for discharge home, he agrees to a single dose of IM medication here And close follow-up with his primary care   Final Clinical Impression(s) / ED Diagnoses Final diagnoses:  Acute left-sided low back pain with left-sided sciatica    Rx / DC Orders ED Discharge Orders    None       Kem Parkinson, PA-C 03/26/20 1338    Milton Ferguson, MD 03/28/20 1342
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
## 2020-03-26 NOTE — Discharge Instructions (Addendum)
Continue taking your prednisone and cyclobenzaprine as directed.  Follow-up with your doctor for recheck.

## 2020-03-26 NOTE — ED Triage Notes (Signed)
Patient comes to the ED with lower back pain since Thursday. Patient went to Urgent care and received prednisone that gave some relief until this morning.

## 2020-03-28 DIAGNOSIS — S233XXA Sprain of ligaments of thoracic spine, initial encounter: Secondary | ICD-10-CM | POA: Diagnosis not present

## 2020-03-28 DIAGNOSIS — S338XXA Sprain of other parts of lumbar spine and pelvis, initial encounter: Secondary | ICD-10-CM | POA: Diagnosis not present

## 2020-04-24 DIAGNOSIS — S338XXA Sprain of other parts of lumbar spine and pelvis, initial encounter: Secondary | ICD-10-CM | POA: Diagnosis not present

## 2020-04-24 DIAGNOSIS — M9902 Segmental and somatic dysfunction of thoracic region: Secondary | ICD-10-CM | POA: Diagnosis not present

## 2020-04-24 DIAGNOSIS — S233XXA Sprain of ligaments of thoracic spine, initial encounter: Secondary | ICD-10-CM | POA: Diagnosis not present

## 2020-04-24 DIAGNOSIS — M9903 Segmental and somatic dysfunction of lumbar region: Secondary | ICD-10-CM | POA: Diagnosis not present

## 2020-08-29 ENCOUNTER — Telehealth (HOSPITAL_COMMUNITY): Payer: Self-pay | Admitting: Physical Therapy

## 2020-08-29 NOTE — Telephone Encounter (Signed)
[facility]/w [facility] they are trying to fax a referral to [place] - we are having fax machine issues. They will re-fax again today

## 2020-10-03 ENCOUNTER — Other Ambulatory Visit (HOSPITAL_COMMUNITY): Payer: Self-pay | Admitting: Nurse Practitioner

## 2020-10-03 DIAGNOSIS — M542 Cervicalgia: Secondary | ICD-10-CM

## 2020-10-03 DIAGNOSIS — R52 Pain, unspecified: Secondary | ICD-10-CM

## 2020-10-13 ENCOUNTER — Ambulatory Visit (HOSPITAL_COMMUNITY): Payer: Medicare HMO

## 2020-10-13 ENCOUNTER — Other Ambulatory Visit: Payer: Self-pay

## 2020-10-13 ENCOUNTER — Ambulatory Visit (HOSPITAL_COMMUNITY)
Admission: RE | Admit: 2020-10-13 | Discharge: 2020-10-13 | Disposition: A | Discharge status: Home / Self Care | Payer: No Typology Code available for payment source | Source: Ambulatory Visit | Attending: Nurse Practitioner | Admitting: Nurse Practitioner

## 2020-10-13 DIAGNOSIS — M4802 Spinal stenosis, cervical region: Secondary | ICD-10-CM | POA: Diagnosis not present

## 2020-10-13 DIAGNOSIS — M542 Cervicalgia: Secondary | ICD-10-CM

## 2020-10-13 DIAGNOSIS — M25511 Pain in right shoulder: Secondary | ICD-10-CM | POA: Diagnosis not present

## 2020-10-13 DIAGNOSIS — M4722 Other spondylosis with radiculopathy, cervical region: Secondary | ICD-10-CM | POA: Diagnosis not present

## 2020-10-13 DIAGNOSIS — M50123 Cervical disc disorder at C6-C7 level with radiculopathy: Secondary | ICD-10-CM | POA: Diagnosis not present

## 2020-10-13 DIAGNOSIS — R52 Pain, unspecified: Secondary | ICD-10-CM | POA: Insufficient documentation

## 2020-10-13 DIAGNOSIS — M5011 Cervical disc disorder with radiculopathy,  high cervical region: Secondary | ICD-10-CM | POA: Diagnosis not present

## 2020-10-13 IMAGING — MR MR CERVICAL SPINE W/O CM
5 series · 35 of 48 positions shown · non-contrast
Comparison: No pertinent prior exams available for comparison.

CLINICAL DATA: Cervical pain. Additional history provided by
scanning technologist: Patient reports pain in right side of neck
and right shoulder for 2 months, history of colon cancer.

EXAM:
MRI CERVICAL SPINE WITHOUT CONTRAST
TECHNIQUE: Multiplanar, multisequence MR imaging of the cervical spine was
performed. No intravenous contrast was administered.

[Series 5: T2 · sagittal · 3.0mm · 0.69mm/px · 6 of 15 slices shown (1 of 2)]
[im 1/15]
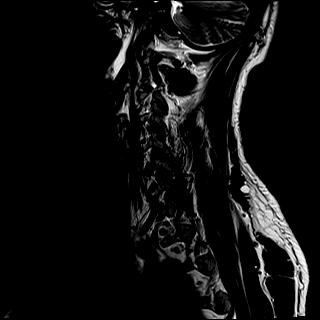
[im 3/15]
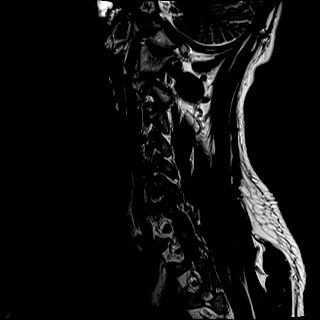
[im 6/15]
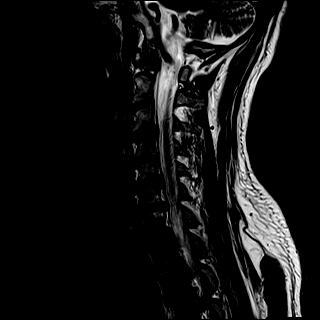
[im 9/15]
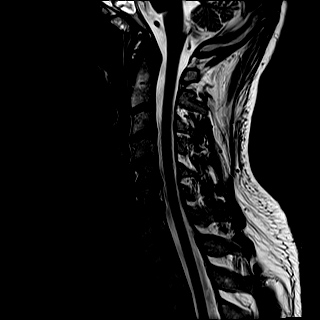
[im 12/15]
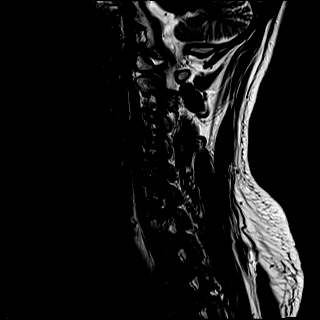
[im 15/15]
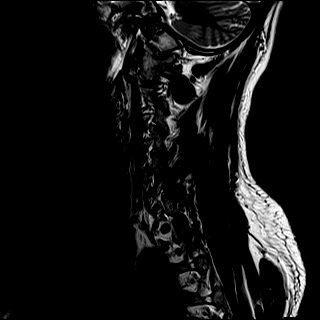

[Series 6: T1 · sagittal · 3.0mm · 0.86mm/px · 6 of 15 slices shown]
[im 1/15]
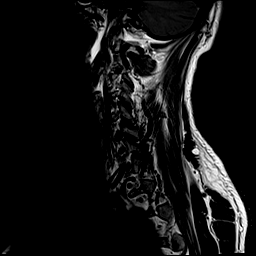
[im 3/15]
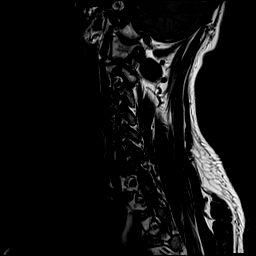
[im 6/15]
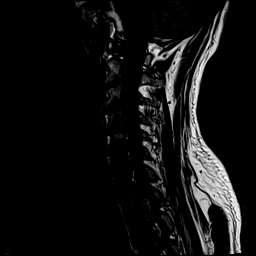
[im 9/15]
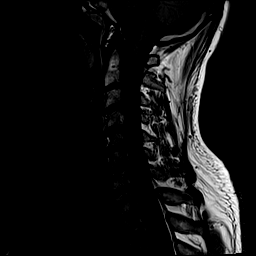
[im 12/15]
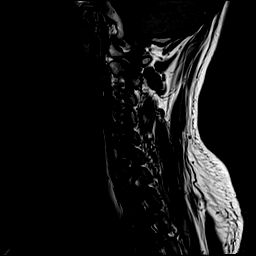
[im 15/15]
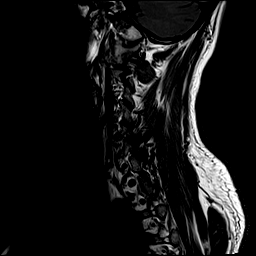

[Series 7: STIR · sagittal · 3.0mm · 0.69mm/px · 6 of 15 slices shown]
[im 1/15]
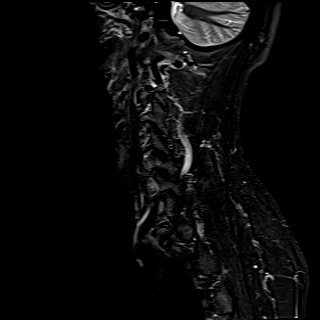
[im 3/15]
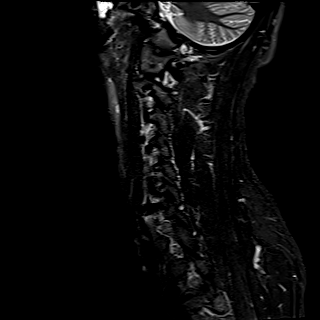
[im 6/15]
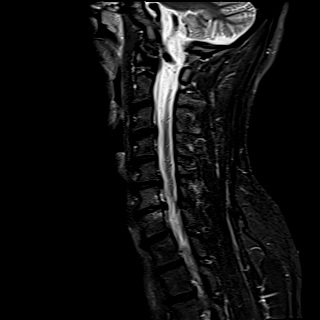
[im 9/15]
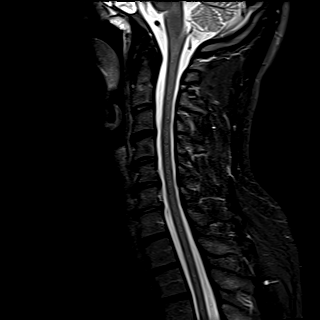
[im 12/15]
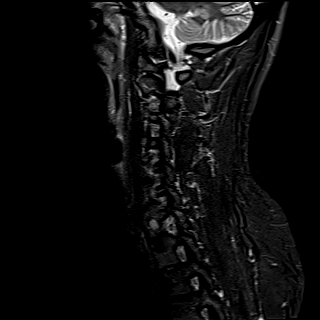
[im 15/15]
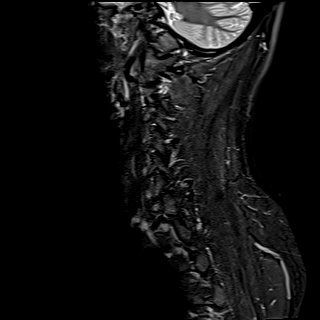

[Series 8: T2 · axial · 3.0mm · 0.70mm/px · z∈[-66,+52]mm · 9 of 38 slices shown (2 of 2)]
[im 1/38]
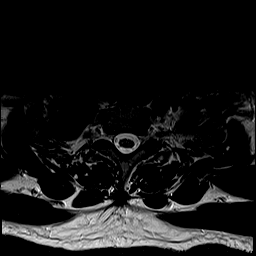
[im 6/38]
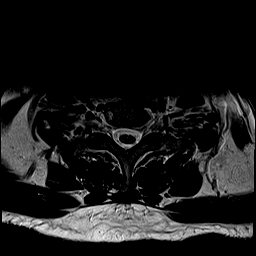
[im 11/38]
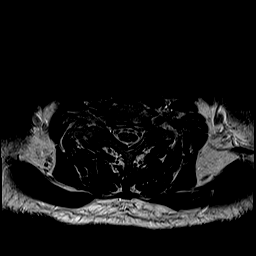
[im 16/38]
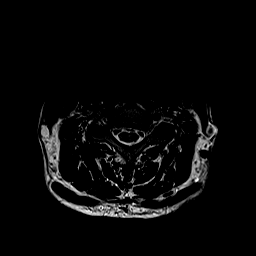
[im 19/38]
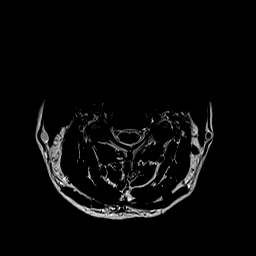
[im 22/38]
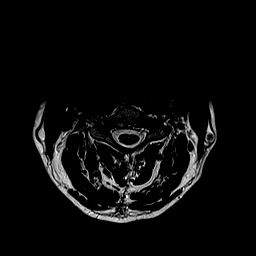
[im 27/38]
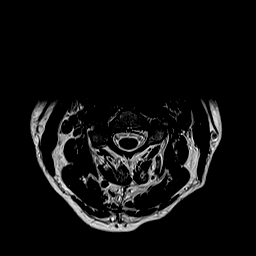
[im 32/38]
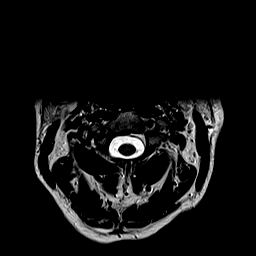
[im 38/38]
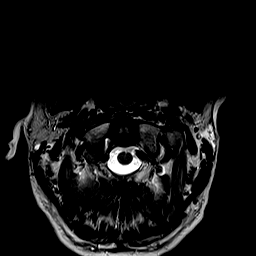

[Series 9: GRE · axial · 3.0mm · 0.35mm/px · z∈[-66,+52]mm · 8 of 38 slices shown]
[im 1/38]
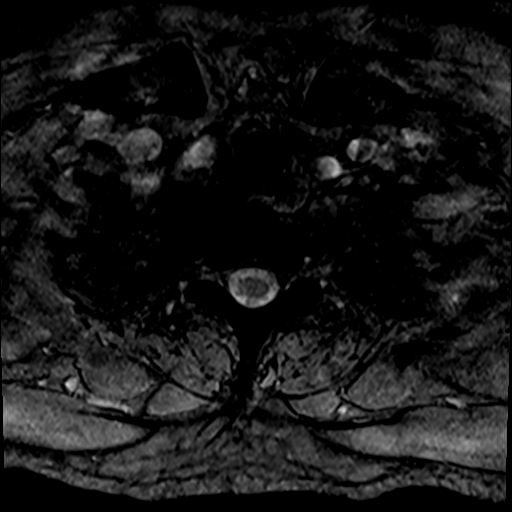
[im 6/38]
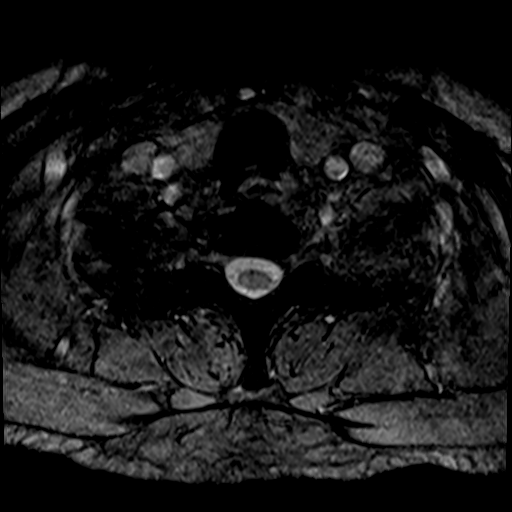
[im 11/38]
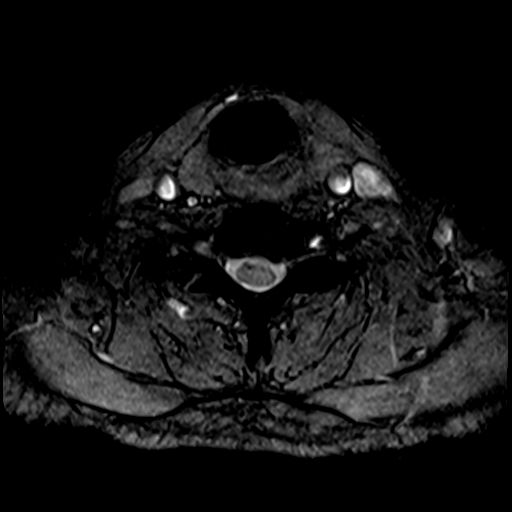
[im 16/38]
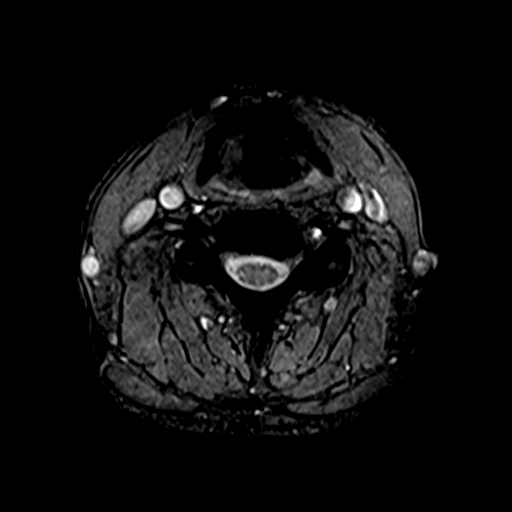
[im 22/38]
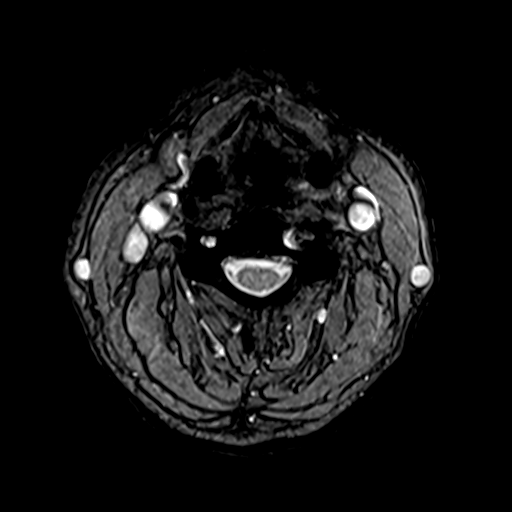
[im 27/38]
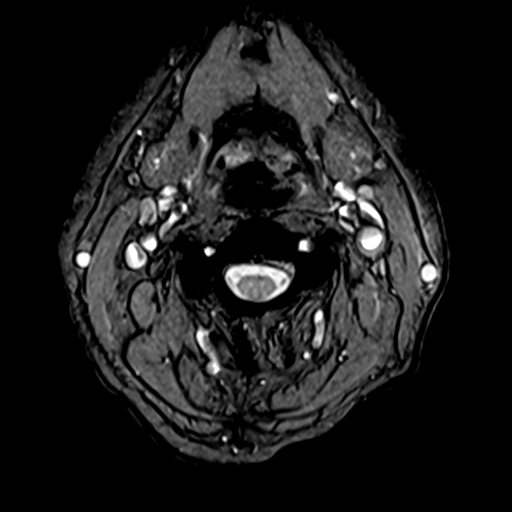
[im 32/38]
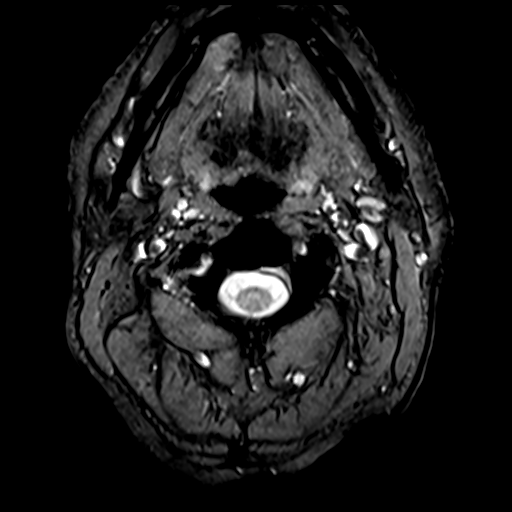
[im 38/38]
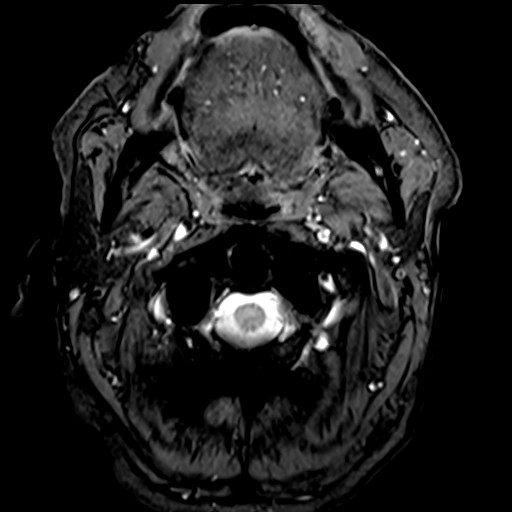

[35 of 48 positions shown; findings below may reference images not displayed]

FINDINGS: Alignment: Straightening of the expected cervical lordosis. No
significant spondylolisthesis.

Vertebrae: Vertebral body height is maintained. Mild degenerative
endplate edema at C6-C7. Well-circumscribed 6 mm T2/STIR
hyperintense, T1 hypointense lesion within the right aspect of the
C7 vertebral body (series 7, image 4).

Cord: No spinal cord signal abnormality.

Posterior Fossa, vertebral arteries, paraspinal tissues: No
abnormality identified within included portions of the posterior
fossa. Flow voids preserved within the imaged cervical vertebral
arteries. Paraspinal soft tissues within normal limits. Probable
Tornwaldt cyst within the midline nasopharynx. Incompletely imaged
sphenoid sinus mucosal thickening.

Disc levels:

Moderate C6-C7 disc degeneration. Mild disc degeneration at the
remaining levels.

C2-C3: Minimal facet hypertrophy (predominantly on the right). No
significant disc herniation or stenosis.

C3-C4: Shallow disc bulge. Mild uncovertebral hypertrophy with
left-sided disc osteophyte ridge. Minimal facet hypertrophy. No
significant spinal canal stenosis. Borderline mild bilateral neural
foraminal narrowing.

C4-C5: Mild uncovertebral hypertrophy (greater on the right).
Minimal facet hypertrophy. No significant spinal canal stenosis.
Mild right neural foraminal narrowing.

C5-C6: Disc bulge. Superimposed shallow left center to left
foraminal disc protrusion. Uncovertebral hypertrophy. Minimal facet
hypertrophy. Mild relative spinal canal narrowing. Moderate
bilateral neural foraminal narrowing. The disc protrusion contacts
the exiting left C6 nerve root.

C6-C7: Disc bulge. Superimposed broad-based left center to left
foraminal disc protrusion. Uncovertebral hypertrophy. Mild
effacement of the ventral thecal sac. Bilateral neural foraminal
narrowing (moderate right, moderate/severe left).

C7-T1: No significant disc herniation or stenosis.
IMPRESSION: Indeterminate 6 mm circumscribed T2/STIR hyperintense, T1
hypointense lesion within the right aspect of the C7 vertebral body.
MRI follow-up should be considered to ensure stability.

Cervical spondylosis as described with findings most notably as
follows.

At C5-C6, there is a disc bulge with superimposed shallow left
center to left foraminal disc protrusion. Uncovertebral and facet
hypertrophy. Mild relative spinal canal narrowing. Moderate
bilateral neural foraminal narrowing. The disc protrusion likely
contacts the exiting left C6 nerve root.

At C6-C7, there is moderate disc degeneration. Disc bulge.
Superimposed broad-based left center to left foraminal disc
protrusion. Uncovertebral hypertrophy. Mild relative spinal canal
narrowing. Bilateral neural foraminal narrowing (moderate right,
moderate/severe left).

## 2020-10-13 IMAGING — MR MR SHOULDER*R* W/O CM
5 series · 40 of 40 positions shown · non-contrast
Comparison: X-ray [DATE]

CLINICAL DATA: Right shoulder pain for 2 months.  No known injury.

EXAM:
MRI OF THE RIGHT SHOULDER WITHOUT CONTRAST
TECHNIQUE: Multiplanar, multisequence MR imaging of the shoulder was performed.
No intravenous contrast was administered.

[Series 3: T2 fat-sat · axial · right · 4.0mm · 0.49mm/px · z∈[-58,+38]mm · 8 of 22 slices shown (1 of 3)]
[im 1/22]
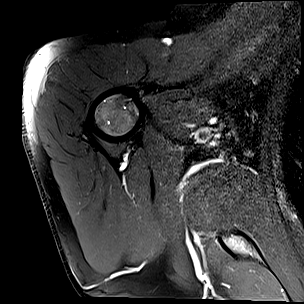
[im 4/22]
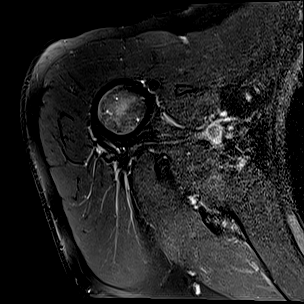
[im 7/22]
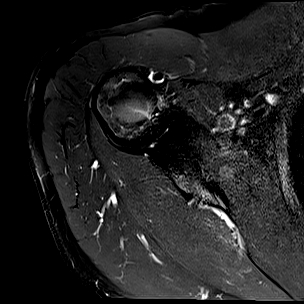
[im 10/22]
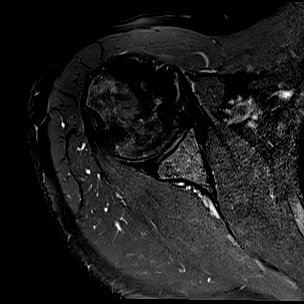
[im 13/22]
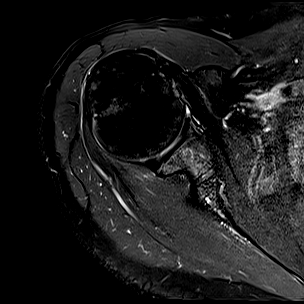
[im 16/22]
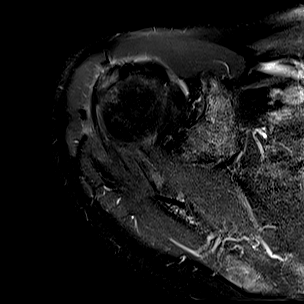
[im 19/22]
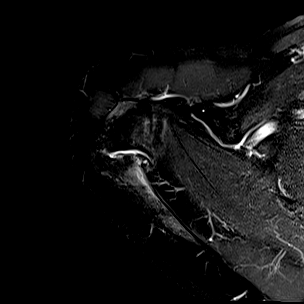
[im 22/22]
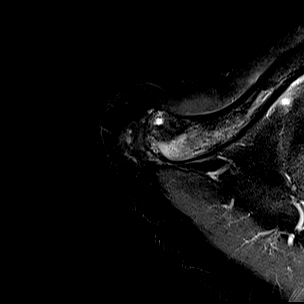

[Series 4: T1 · oblique · right · 4.0mm · 0.41mm/px · 8 of 21 slices shown]
[im 1/21]
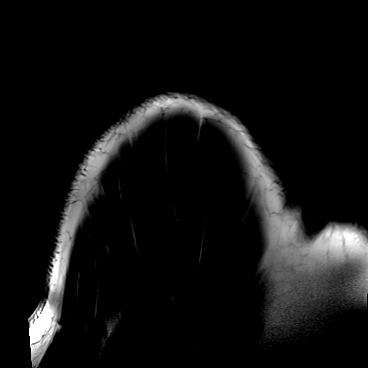
[im 3/21]
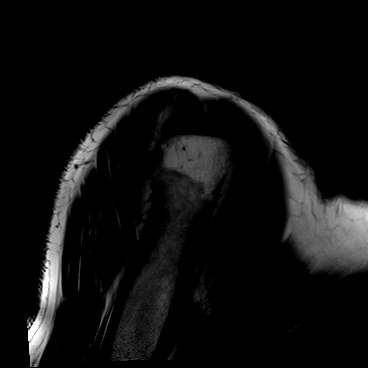
[im 6/21]
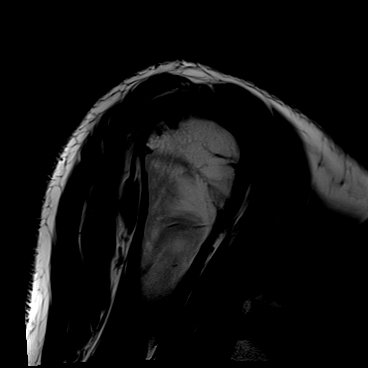
[im 9/21]
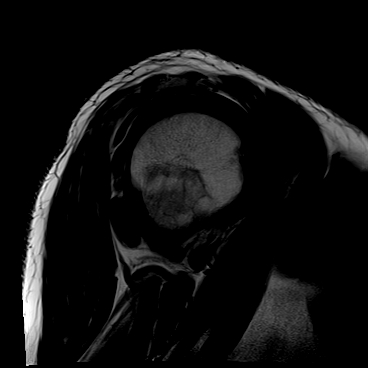
[im 12/21]
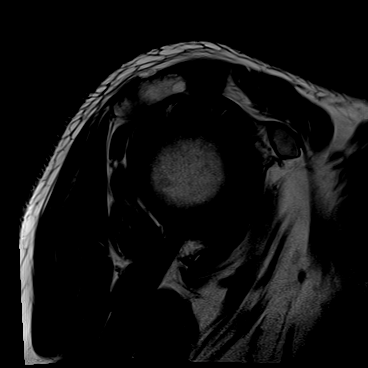
[im 15/21]
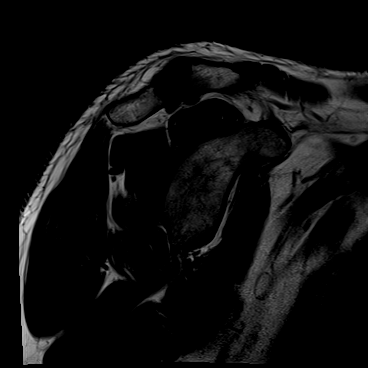
[im 18/21]
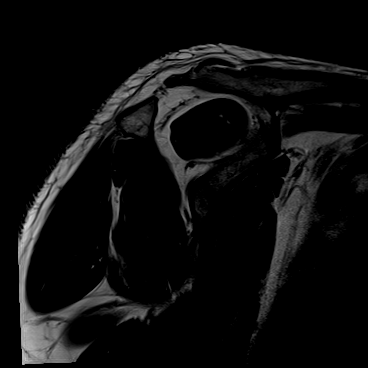
[im 21/21]
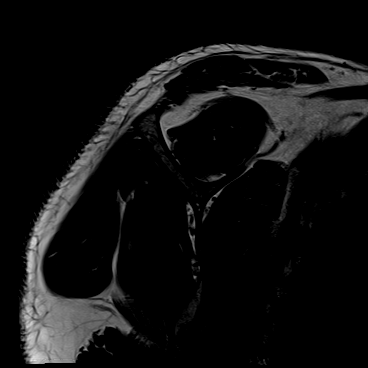

[Series 5: T2 fat-sat · coronal · right · 4.0mm · 0.47mm/px · 8 of 21 slices shown (2 of 3)]
[im 1/21]
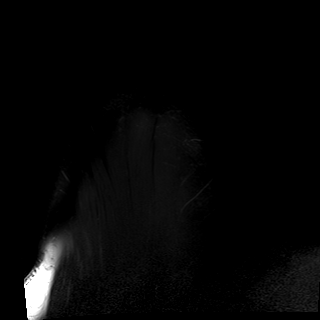
[im 3/21]
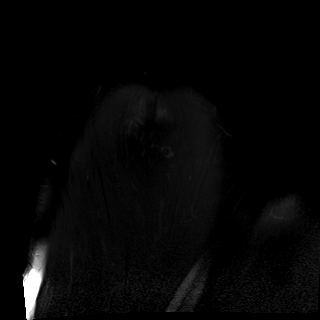
[im 6/21]
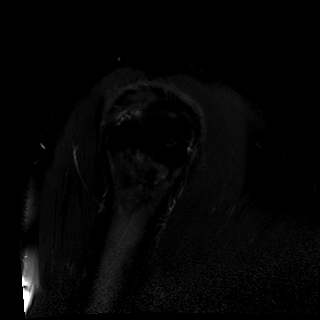
[im 9/21]
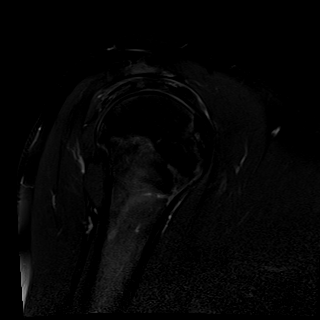
[im 12/21]
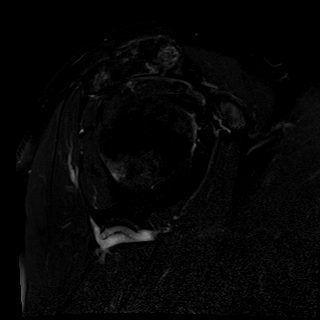
[im 15/21]
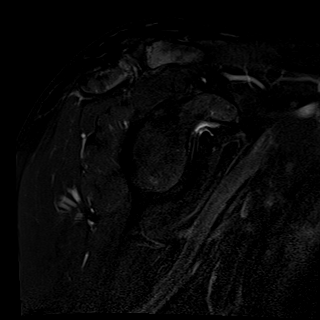
[im 18/21]
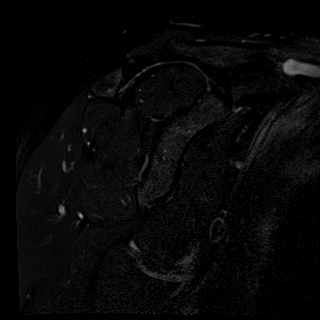
[im 21/21]
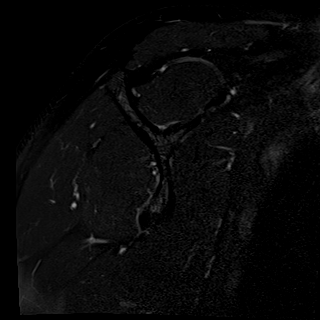

[Series 6: T2 fat-sat · oblique · right · 4.0mm · 0.47mm/px · 8 of 21 slices shown (3 of 3)]
[im 1/21]
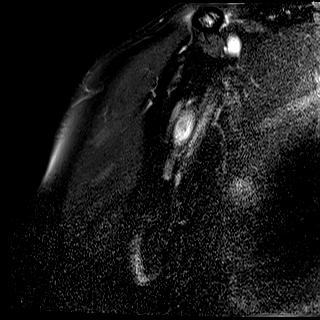
[im 3/21]
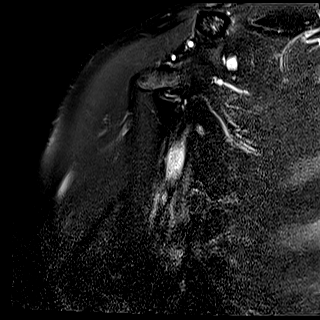
[im 6/21]
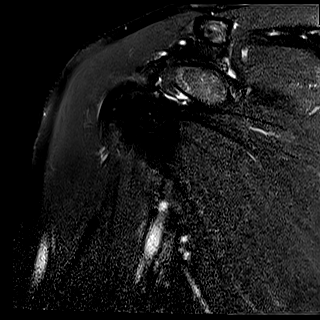
[im 9/21]
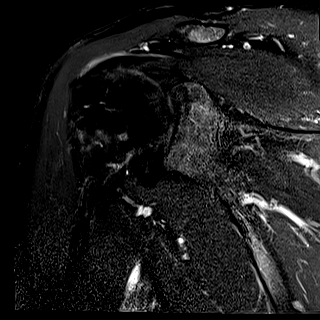
[im 12/21]
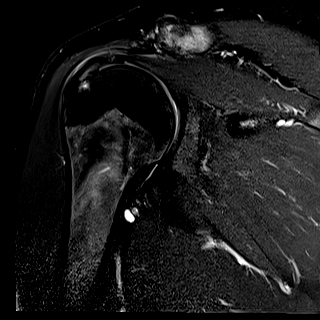
[im 15/21]
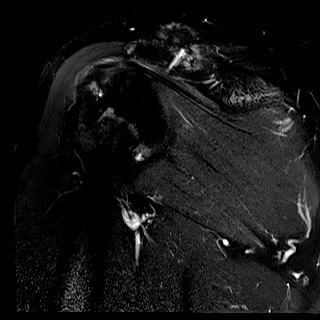
[im 18/21]
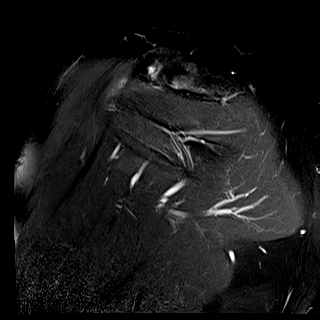
[im 21/21]
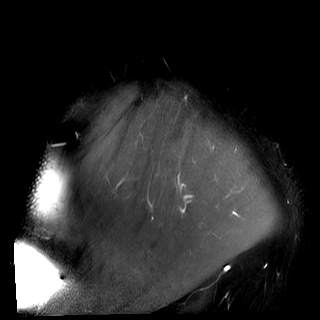

[Series 7: PD · oblique · right · 4.0mm · 0.43mm/px · 8 of 21 slices shown]
[im 1/21]
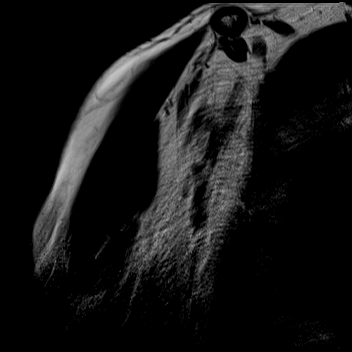
[im 3/21]
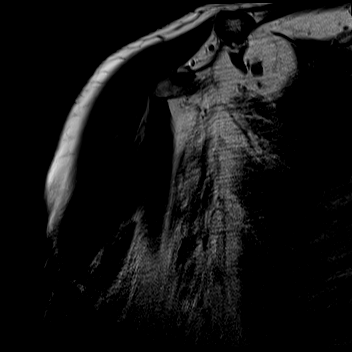
[im 6/21]
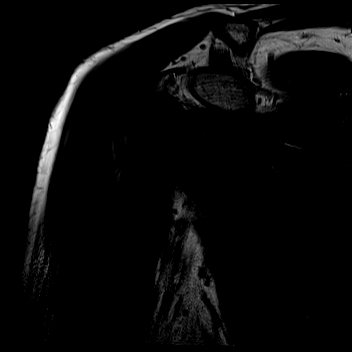
[im 9/21]
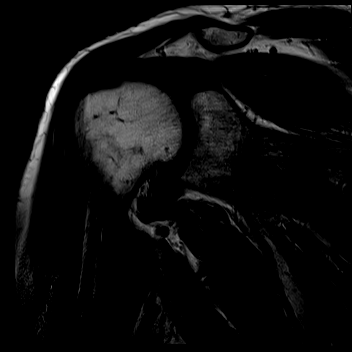
[im 12/21]
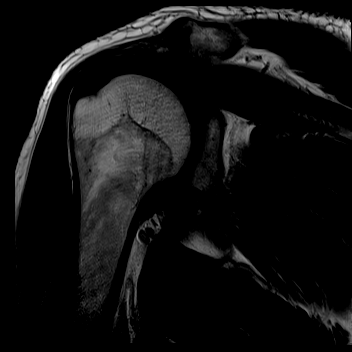
[im 15/21]
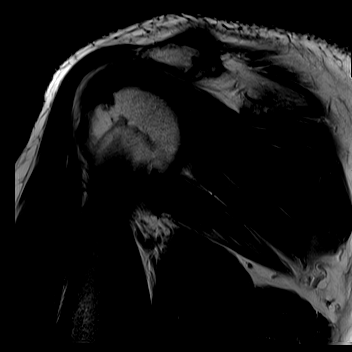
[im 18/21]
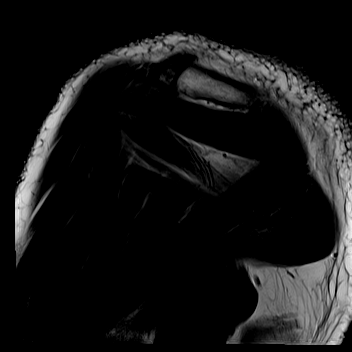
[im 21/21]
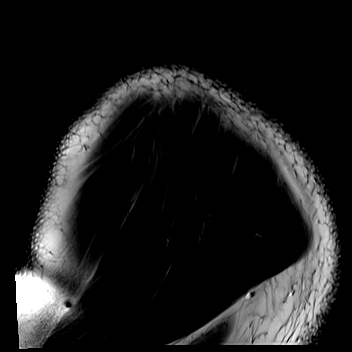

[40 of 40 positions shown; findings below may reference images not displayed]

FINDINGS: Rotator cuff: Moderate-severe infraspinatus tendinosis. Mild
tendinosis of the supraspinatus and subscapularis tendons. Teres
minor unremarkable. No evidence of rotator cuff tear.

Muscles: Preserved bulk and signal intensity of the rotator cuff
musculature without edema, atrophy, or fatty infiltration.

Biceps long head:  Intact.

Acromioclavicular Joint: Moderate arthropathy of the AC joint. Os
acromiale with fluid signal and minimal degenerative changes along
the lateral aspect of the synchondrosis. Trace
subacromial-subdeltoid bursal fluid.

Glenohumeral Joint: No joint effusion. No chondral defect.

Labrum: Grossly intact, but evaluation is limited by lack of
intraarticular fluid. No paralabral cyst.

Bones:  No marrow abnormality, fracture or dislocation.

Other: None.
IMPRESSION: 1. Moderate-severe infraspinatus tendinosis. Mild tendinosis of the
supraspinatus and subscapularis tendons. No evidence of rotator cuff
tear.
2. Moderate AC joint arthropathy.
3. Os acromiale with fluid signal and minimal degenerative changes
along the lateral aspect of the synchondrosis.

## 2020-10-14 ENCOUNTER — Other Ambulatory Visit: Payer: Self-pay

## 2020-10-14 ENCOUNTER — Emergency Department (HOSPITAL_COMMUNITY)
Admission: EM | Admit: 2020-10-14 | Discharge: 2020-10-14 | Disposition: A | Discharge status: Home / Self Care | Payer: No Typology Code available for payment source | Attending: Emergency Medicine | Admitting: Emergency Medicine

## 2020-10-14 ENCOUNTER — Emergency Department (HOSPITAL_COMMUNITY): Payer: No Typology Code available for payment source

## 2020-10-14 ENCOUNTER — Encounter (HOSPITAL_COMMUNITY): Payer: Self-pay | Admitting: Emergency Medicine

## 2020-10-14 DIAGNOSIS — Z79899 Other long term (current) drug therapy: Secondary | ICD-10-CM | POA: Insufficient documentation

## 2020-10-14 DIAGNOSIS — I10 Essential (primary) hypertension: Secondary | ICD-10-CM | POA: Insufficient documentation

## 2020-10-14 DIAGNOSIS — W000XXA Fall on same level due to ice and snow, initial encounter: Secondary | ICD-10-CM | POA: Insufficient documentation

## 2020-10-14 DIAGNOSIS — Y9301 Activity, walking, marching and hiking: Secondary | ICD-10-CM | POA: Insufficient documentation

## 2020-10-14 DIAGNOSIS — S8991XA Unspecified injury of right lower leg, initial encounter: Secondary | ICD-10-CM | POA: Diagnosis present

## 2020-10-14 DIAGNOSIS — W19XXXA Unspecified fall, initial encounter: Secondary | ICD-10-CM

## 2020-10-14 DIAGNOSIS — Z7982 Long term (current) use of aspirin: Secondary | ICD-10-CM | POA: Diagnosis not present

## 2020-10-14 DIAGNOSIS — Z85038 Personal history of other malignant neoplasm of large intestine: Secondary | ICD-10-CM | POA: Diagnosis not present

## 2020-10-14 DIAGNOSIS — S6991XA Unspecified injury of right wrist, hand and finger(s), initial encounter: Secondary | ICD-10-CM | POA: Insufficient documentation

## 2020-10-14 DIAGNOSIS — S8011XA Contusion of right lower leg, initial encounter: Secondary | ICD-10-CM | POA: Insufficient documentation

## 2020-10-14 DIAGNOSIS — Z85048 Personal history of other malignant neoplasm of rectum, rectosigmoid junction, and anus: Secondary | ICD-10-CM | POA: Diagnosis not present

## 2020-10-14 DIAGNOSIS — F17210 Nicotine dependence, cigarettes, uncomplicated: Secondary | ICD-10-CM | POA: Diagnosis not present

## 2020-10-14 DIAGNOSIS — Z91010 Allergy to peanuts: Secondary | ICD-10-CM | POA: Insufficient documentation

## 2020-10-14 DIAGNOSIS — Y92481 Parking lot as the place of occurrence of the external cause: Secondary | ICD-10-CM | POA: Insufficient documentation

## 2020-10-14 DIAGNOSIS — M25531 Pain in right wrist: Secondary | ICD-10-CM | POA: Diagnosis not present

## 2020-10-14 DIAGNOSIS — S80811A Abrasion, right lower leg, initial encounter: Secondary | ICD-10-CM | POA: Diagnosis not present

## 2020-10-14 IMAGING — DX DG WRIST COMPLETE 3+V*R*
3 series · 3 of 3 positions shown · non-contrast
Comparison: None.

CLINICAL DATA: Pain after a fall.

EXAM:
RIGHT WRIST - COMPLETE 3+ VIEW

[wrist ap]
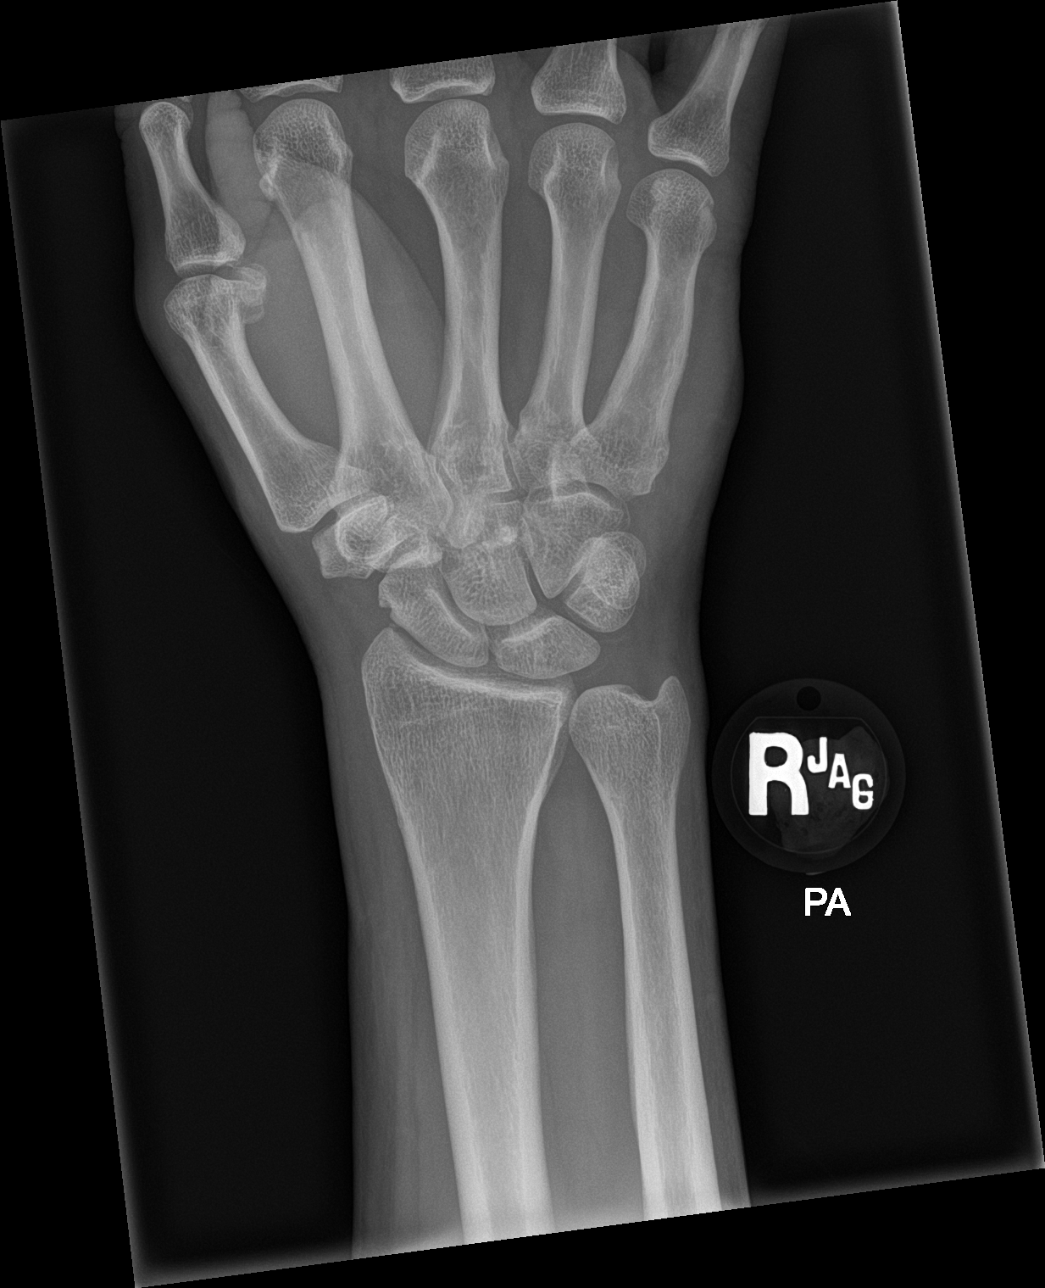

[wrist obl]
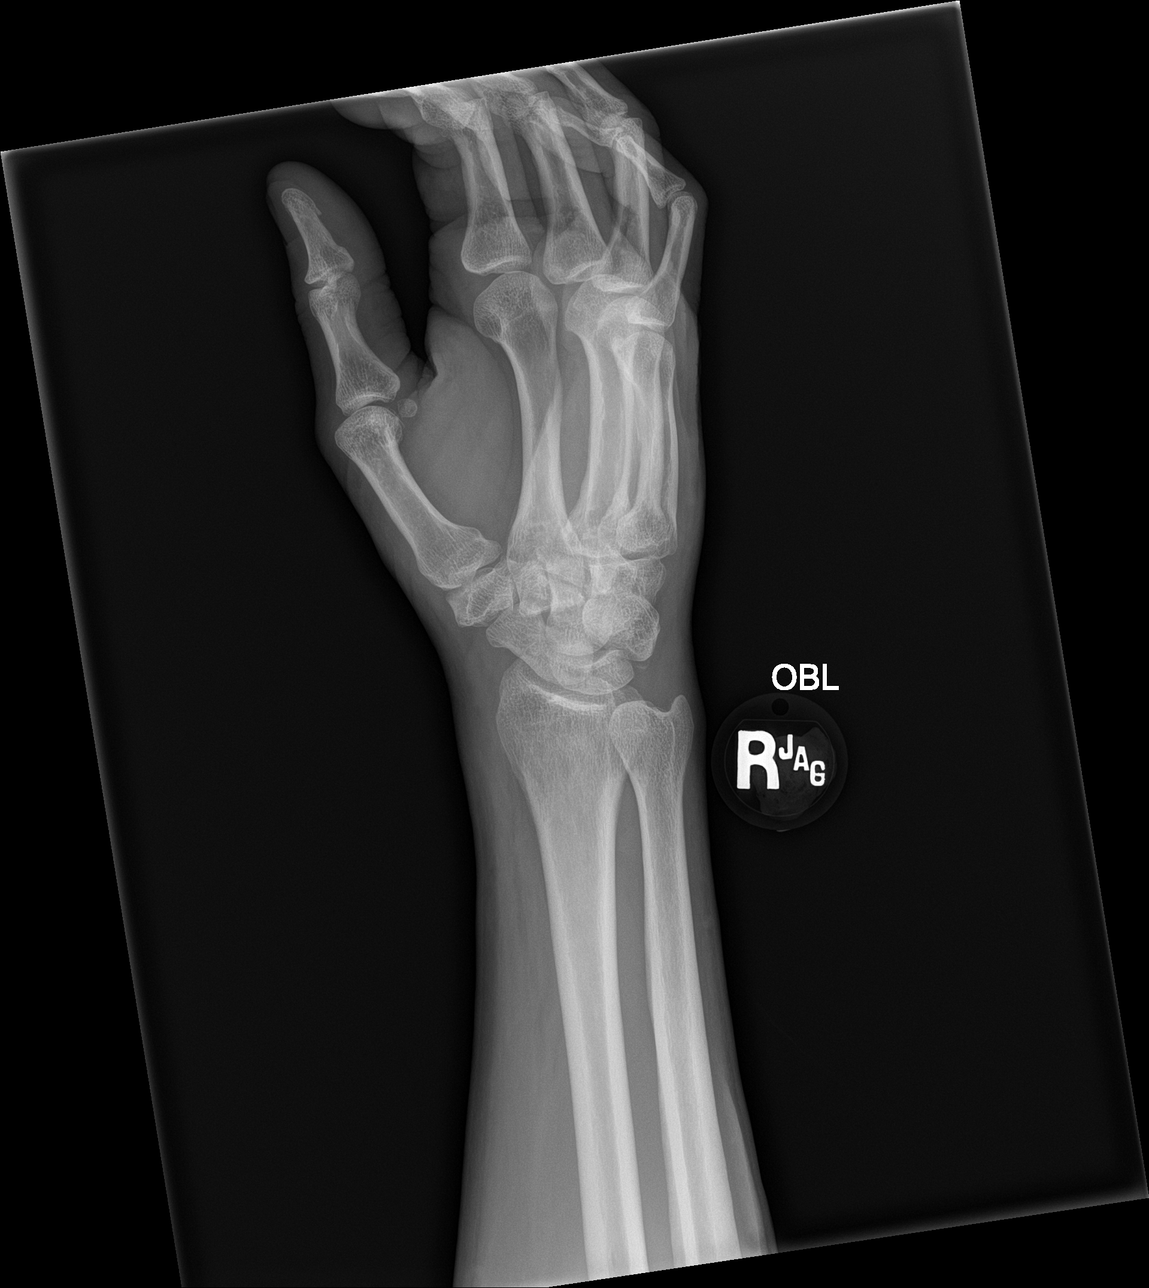

[wrist lat]
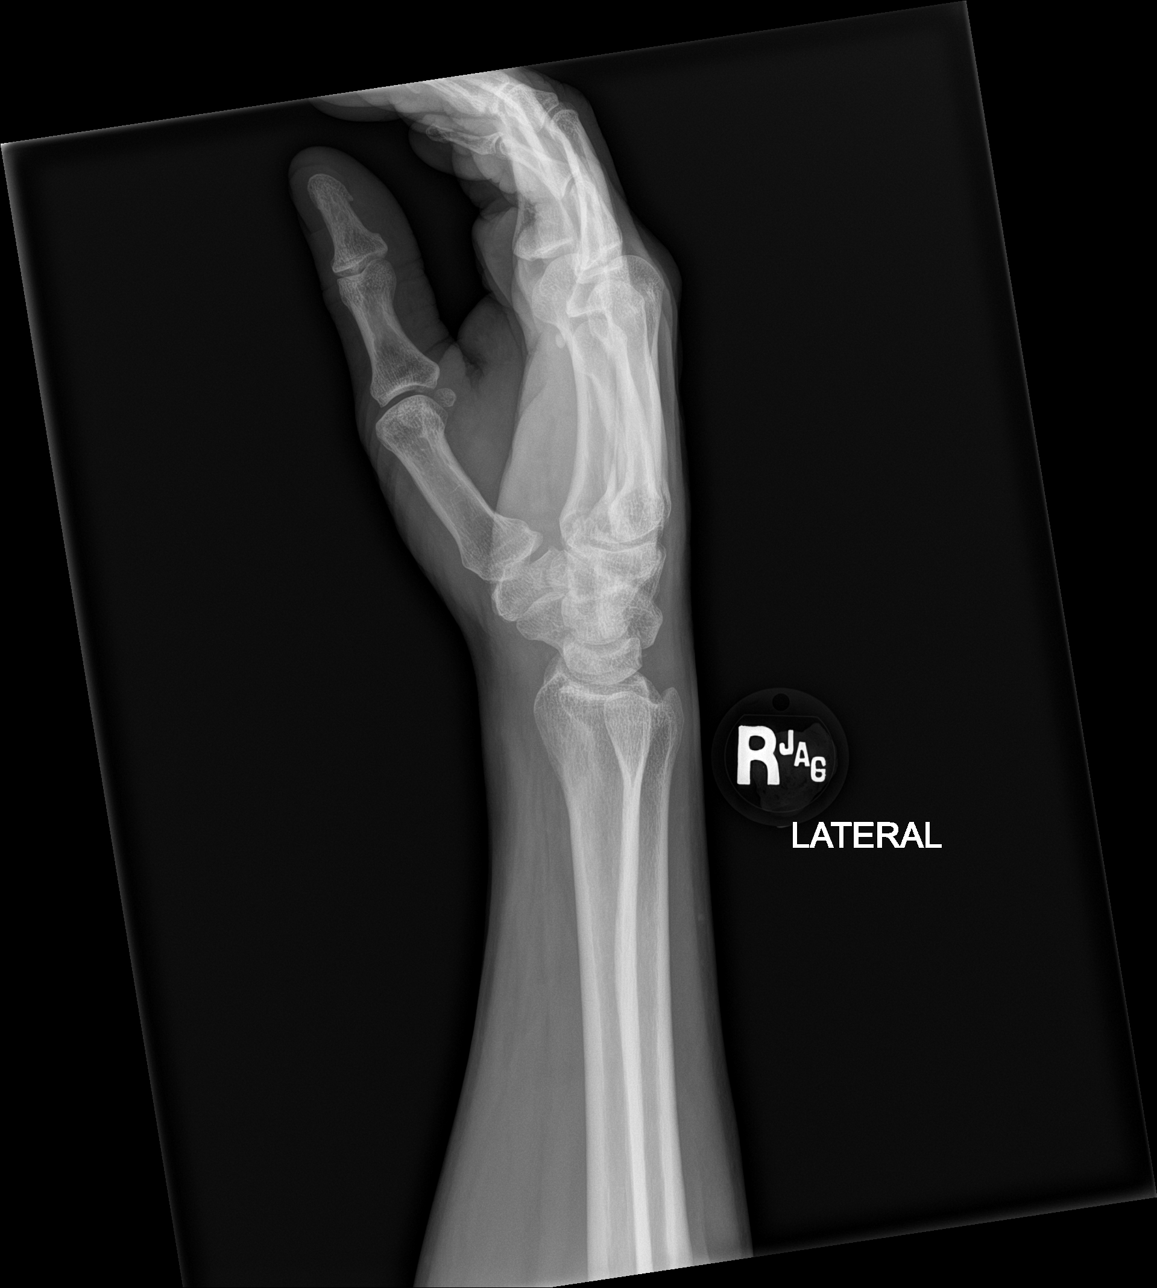

[3 of 3 positions shown; findings below may reference images not displayed]

FINDINGS: Lateral view is suboptimal secondary to mild obliquity. No
convincing evidence of fracture or dislocation. On the oblique
image, focus of increased density projecting proximal to the
proximal carpal row is indeterminate, without donor site identified.
Scaphoid intact.
IMPRESSION: Subtle increased density projecting proximal to the proximal row of
carpal bones. Correlate with point tenderness. No donor site
identified to confirm fracture. Otherwise, no acute osseous
abnormality identified.

## 2020-10-14 IMAGING — DX DG TIBIA/FIBULA 2V*R*
3 series · 3 of 3 positions shown · non-contrast
Comparison: None.

CLINICAL DATA: Patient status post fall. Abrasion medial right
lower extremity.

EXAM:
RIGHT TIBIA AND FIBULA - 2 VIEW

[tibia ap (1 of 2)]
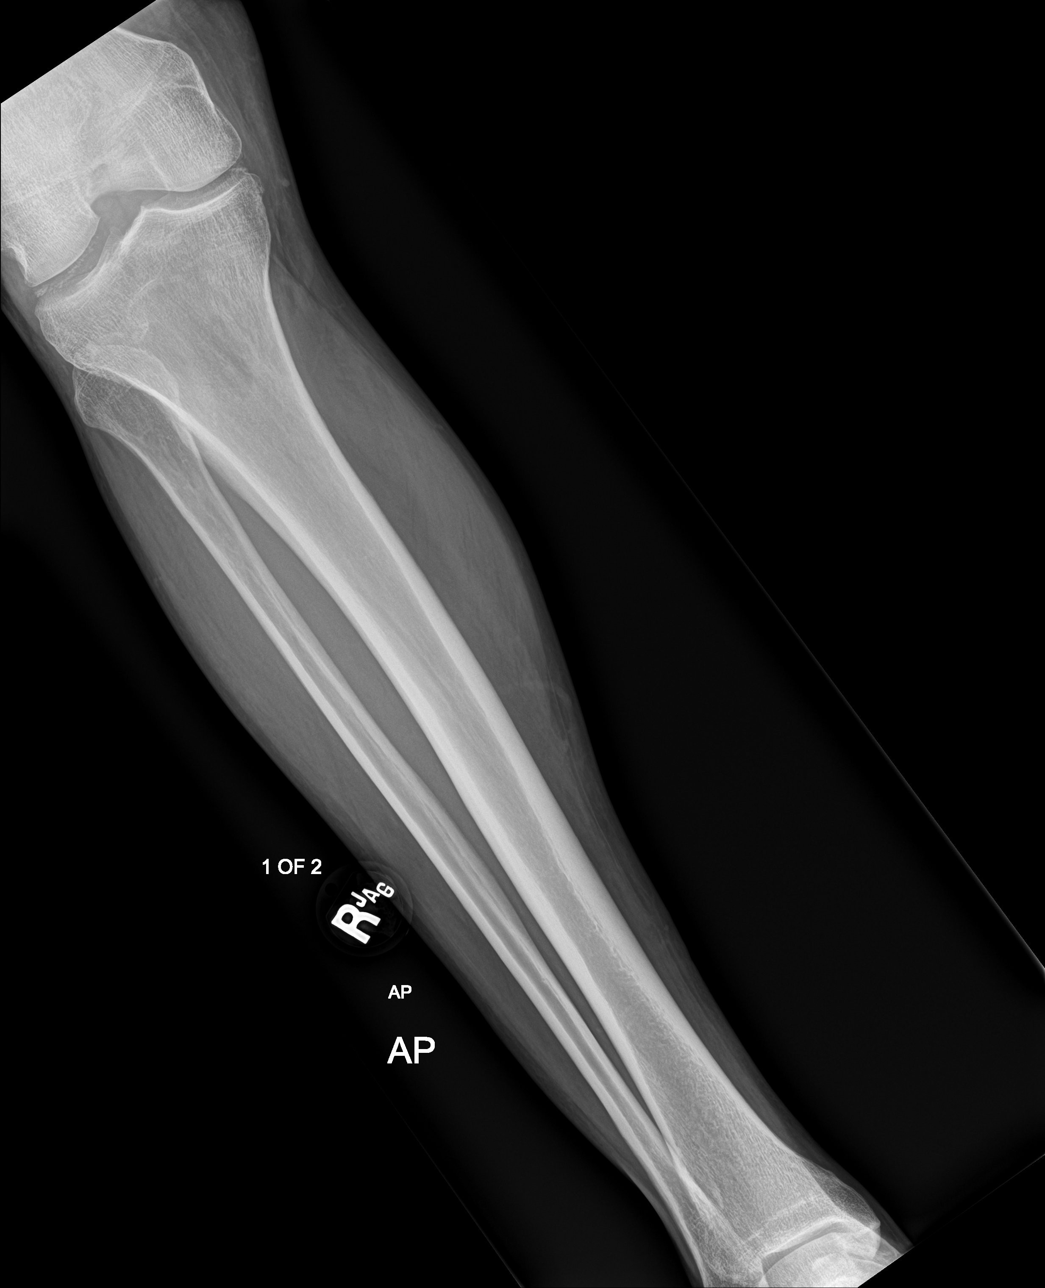

[tibia ap (2 of 2)]
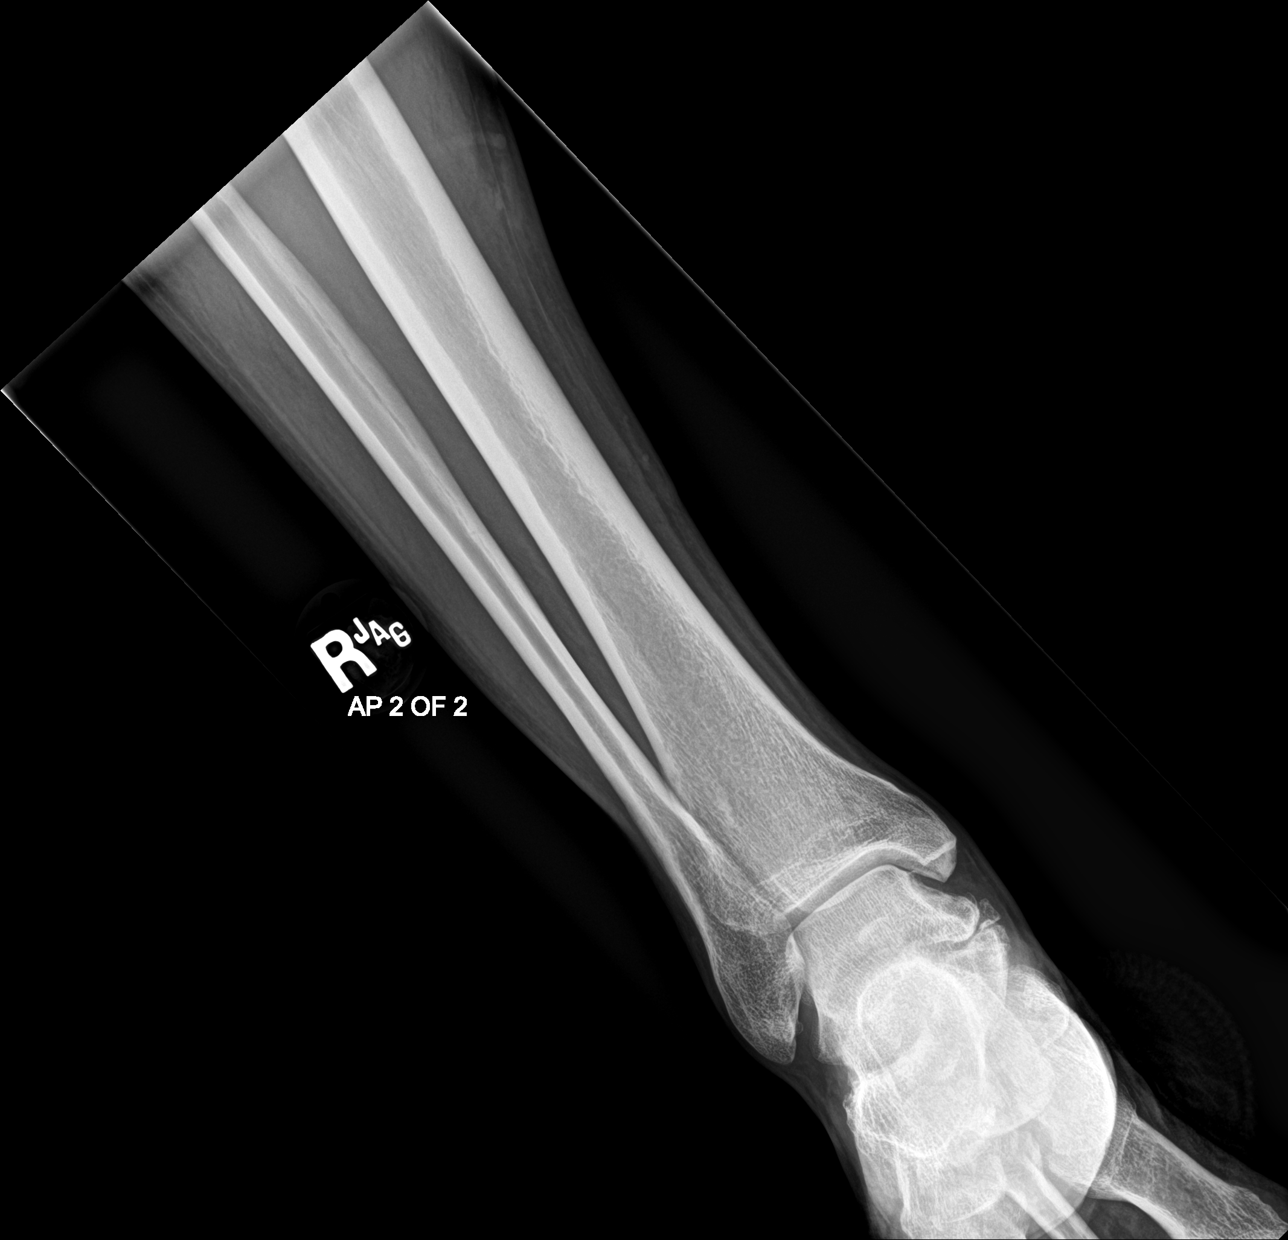

[tibia lat]
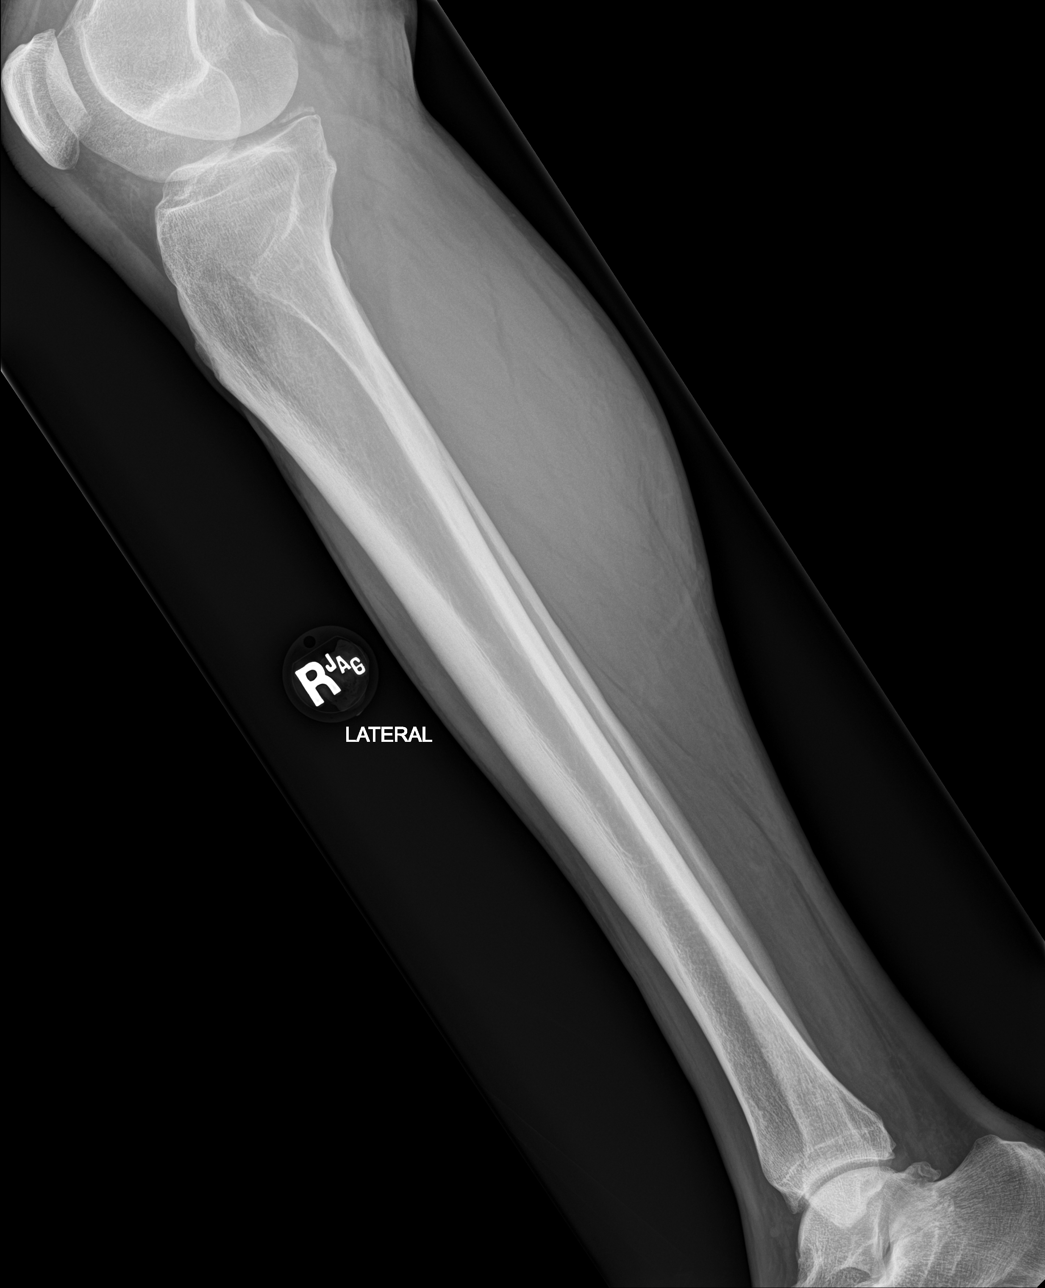

[3 of 3 positions shown; findings below may reference images not displayed]

FINDINGS: Normal anatomic alignment. No evidence for acute fracture or
dislocation. Chondrocalcinosis. Regional soft tissues unremarkable.
IMPRESSION: No acute osseous abnormality.

## 2020-10-14 MED ORDER — KETOROLAC TROMETHAMINE 30 MG/ML IJ SOLN
60.0000 mg | Freq: Once | INTRAMUSCULAR | Status: AC
  Administered 2020-10-14: 60 mg via INTRAMUSCULAR
  Filled 2020-10-14: qty 2

## 2020-10-14 MED ORDER — OXYCODONE-ACETAMINOPHEN 5-325 MG PO TABS: 1.0000 | ORAL_TABLET | ORAL | 0 refills | Status: DC | PRN

## 2020-10-14 MED ORDER — IBUPROFEN 800 MG PO TABS: 800.0000 mg | ORAL_TABLET | Freq: Three times a day (TID) | ORAL | 0 refills | Status: DC

## 2020-10-14 NOTE — ED Provider Notes (Signed)
[facility] EMERGENCY DEPARTMENT Provider Note   CSN: [id number] Arrival date & time: 10/14/20  [id number]     History Chief Complaint  Patient presents with  . Wrist Injury    Clinton Ross is a 55 y.o. male.  HPI      Clinton Ross is a 55 y.o. male who presents to the Emergency Department complaining of right wrist and right lower leg pain secondary to a mechanical fall that occurred last evening.  He states he was walking through a grocery store parking lot when he slipped on some ice and fell landing on an outstretched right hand.  He complains of aching pain to his wrist with movement of his right thumb.  He also complains of pain and swelling to the medial aspect of his mid right lower leg.  Pain was constant last evening and he states he was unable to sleep due to his pain.  He is right-hand dominant.  He denies head injury, LOC, headache, dizziness, nausea or vomiting, rib or chest pain.  Pain worsens with weightbearing and movement.  No alleviating factors.    Past Medical History:  Diagnosis Date  . Acute pancreatitis 10/2013  . Arthritis   . Bulging disc   . Cancer [facility])    Colon  . Chronic knee pain   . Colostomy in place [facility])   . Gallstones   . Hypertension   . PTSD (post-traumatic stress disorder)   . Sciatica     Patient Active Problem List   Diagnosis Date Noted  . Pancreatitis 06/08/2018  . Chest pain 07/21/2016  . Cholelithiasis 11/28/2013  . Tobacco abuse 11/28/2013  . Abdominal pain 11/27/2013  . Acute pancreatitis 11/20/2013  . Constipation 11/20/2013  . HTN (hypertension), benign 11/20/2013  . PTSD (post-traumatic stress disorder) 11/20/2013  . Sciatica 05/24/2009  . DERANGEMENT MENISCUS 10/24/2008  . JOINT EFFUSION, KNEE 10/24/2008  . KNEE PAIN 10/24/2008    Past Surgical History:  Procedure Laterality Date  . CHOLECYSTECTOMY    . COLON SURGERY    . rectal cancer Right        Family History  Problem Relation Age of Onset  .  Hypertension Mother   . Stroke Father   . Diabetes Other     Social History   Tobacco Use  . Smoking status: Current Every Day Smoker    Packs/day: 0.50    Years: 30.00    Pack years: 15.00    Types: Cigarettes  . Smokeless tobacco: Never Used  Substance Use Topics  . Alcohol use: No  . Drug use: No    Home Medications Prior to Admission medications   Medication Sig Start Date End Date Taking? Authorizing Provider  amLODipine (NORVASC) 5 MG tablet Take 1 tablet (5 mg total) by mouth daily. Patient taking differently: Take 10 mg by mouth daily.  06/11/18   Kathie Dike, MD  aspirin 81 MG chewable tablet Chew 1 tablet (81 mg total) by mouth daily. Patient taking differently: Chew 81 mg by mouth 3 (three) times a week.  02/23/18   Julianne Rice, MD  cyclobenzaprine (FLEXERIL) 10 MG tablet Take 1 tablet (10 mg total) by mouth at bedtime. 03/23/20   Wurst, [place], PA-C  doxycycline (VIBRAMYCIN) 100 MG capsule Take 1 capsule (100 mg total) by mouth 2 (two) times daily. 03/23/20   Wurst, [place], PA-C  ibuprofen (ADVIL) 800 MG tablet Take 1 tablet (800 mg total) by mouth 3 (three) times daily. 12/23/19   Pilar Grammes  S, FNP  nitroGLYCERIN (NITROSTAT) 0.4 MG SL tablet Place 1 tablet (0.4 mg total) under the tongue every 5 (five) minutes as needed for chest pain. 02/23/18   Julianne Rice, MD    Allergies    Peanut-containing drug products and Morphine and related  Review of Systems   Review of Systems  Constitutional: Negative for chills and fever.  Eyes: Negative for visual disturbance.  Respiratory: Negative for chest tightness and shortness of breath.   Cardiovascular: Negative for chest pain.  Gastrointestinal: Negative for abdominal pain, nausea and vomiting.  Genitourinary: Negative for difficulty urinating and flank pain.  Musculoskeletal: Positive for arthralgias (right wrist and right lower leg pain. ). Negative for back pain, joint swelling and neck pain.  Skin:  Negative for color change and wound.  Neurological: Negative for dizziness, syncope, weakness, numbness and headaches.    Physical Exam Updated Vital Signs BP (!) 134/96 (BP Location: Left Arm)   Pulse 80   Temp 97.7 F (36.5 C) (Oral)   Resp 16   Ht 5\' 10"  (1.778 m)   Wt 79.4 kg   SpO2 99%   BMI 25.11 kg/m   Physical Exam Vitals and nursing note reviewed.  Constitutional:      General: He is not in acute distress.    Appearance: Normal appearance.  HENT:     Head: Atraumatic.  Eyes:     Conjunctiva/sclera: Conjunctivae normal.     Pupils: Pupils are equal, round, and reactive to light.  Neck:     Thyroid: No thyromegaly.     Meningeal: Kernig's sign absent.     Comments: ttp of the right trapezius muscles.  No midline tenderness of the cervical spine Cardiovascular:     Rate and Rhythm: Normal rate and regular rhythm.     Pulses: Normal pulses.  Pulmonary:     Effort: Pulmonary effort is normal.     Breath sounds: Normal breath sounds. No wheezing.  Abdominal:     Palpations: Abdomen is soft.     Tenderness: There is no abdominal tenderness. There is no guarding or rebound.  Musculoskeletal:        General: Tenderness and signs of injury present.     Right wrist: Tenderness, bony tenderness and snuff box tenderness present. No swelling. Decreased range of motion.     Cervical back: Normal range of motion.       Legs:     Comments: Tenderness of the anatomical snuffbox right wrist.  No bony deformity or edema.  Tenderness, mild edema and abrasion of the medial aspect of right lower leg.    Skin:    General: Skin is warm.     Capillary Refill: Capillary refill takes less than 2 seconds.     Findings: No rash.     Comments: Abrasion medial right lower leg with mild edema noted.    Neurological:     General: No focal deficit present.     Mental Status: He is alert.     Sensory: No sensory deficit.     Motor: No weakness.     ED Results / Procedures /  Treatments   Labs (all labs ordered are listed, but only abnormal results are displayed) Labs Reviewed - No data to display  EKG None  Radiology EXAM: RIGHT TIBIA AND FIBULA - 2 VIEW  COMPARISON: None.  FINDINGS: Normal anatomic alignment. No evidence for acute fracture or dislocation. Chondrocalcinosis. Regional soft tissues unremarkable.  IMPRESSION: No acute osseous abnormality.  CLINICAL  DATA: Pain after a fall.     EXAM: RIGHT WRIST - COMPLETE 3+ VIEW  COMPARISON: None.  FINDINGS: Lateral view is suboptimal secondary to mild obliquity. No convincing evidence of fracture or dislocation. On the oblique image, focus of increased density projecting proximal to the proximal carpal row is indeterminate, without donor site identified. Scaphoid intact.  IMPRESSION: Subtle increased density projecting proximal to the proximal row of carpal bones. Correlate with point tenderness. No donor site identified to confirm fracture. Otherwise, no acute osseous abnormality identified.   Electronically Signed By: Abigail Miyamoto M.D. On: 10/14/2020 11:29  Procedures Procedures (including critical care time)  Medications Ordered in ED Medications  ketorolac (TORADOL) 30 MG/ML injection 60 mg (has no administration in time range)    ED Course  I have reviewed the triage vital signs and the nursing notes.  Pertinent labs & imaging results that were available during my care of the patient were reviewed by me and considered in my medical decision making (see chart for details).    MDM Rules/Calculators/A&P                          Patient here for evaluation of wrist and lower leg pain secondary to a mechanical fall that occurred yesterday.  He had outpatient ordered MRI of his right shoulder and C-spine yesterday as well.  He is being evaluated by the [facility] for his shoulder and neck pain.  XR's today do not show definite fracture.  NV intact.  He does have point tenderness  of the anatomical snuffbox and this could possibly represent ligamentous injury vs occult fracture of the wrist.  He will be placed in a velcro wrist splint and he agrees to close f/u with his orthopedic provider at the [place].     Final Clinical Impression(s) / ED Diagnoses Final diagnoses:  Injury of right wrist, initial encounter  Contusion of right lower leg, initial encounter  Fall, initial encounter    Rx / DC Orders ED Discharge Orders    None       Kem Parkinson, PA-C 10/14/20 1403    Sherwood Gambler, MD 10/14/20 1436

## 2020-10-14 NOTE — ED Triage Notes (Signed)
Fell last night in parking lot at grocery store, hit a patch of ice.  Denies hitting head.  Injury to right wrist, right lower leg and right knee.  Rates pain 7/10.

## 2020-10-14 NOTE — ED Notes (Signed)
Portable x-ray done

## 2020-10-14 NOTE — Discharge Instructions (Addendum)
Elevate and apply ice packs on and off to your wrist and right lower leg to help with swelling.  Keep your right wrist splinted.  You may remove the brace for bathing.  Follow-up with your orthopedic provider at the [facility] or you may contact one of the orthopedic providers listed to arrange follow-up in 1 week if your symptoms are not improving.

## 2020-12-05 ENCOUNTER — Ambulatory Visit (HOSPITAL_COMMUNITY): Payer: No Typology Code available for payment source | Attending: Neurosurgery | Admitting: Physical Therapy

## 2020-12-05 ENCOUNTER — Encounter (HOSPITAL_COMMUNITY): Payer: Self-pay | Admitting: Physical Therapy

## 2020-12-05 ENCOUNTER — Other Ambulatory Visit: Payer: Self-pay

## 2020-12-05 DIAGNOSIS — R29898 Other symptoms and signs involving the musculoskeletal system: Secondary | ICD-10-CM | POA: Diagnosis not present

## 2020-12-05 DIAGNOSIS — M6281 Muscle weakness (generalized): Secondary | ICD-10-CM | POA: Diagnosis not present

## 2020-12-05 DIAGNOSIS — M25511 Pain in right shoulder: Secondary | ICD-10-CM | POA: Diagnosis not present

## 2020-12-05 DIAGNOSIS — M542 Cervicalgia: Secondary | ICD-10-CM | POA: Insufficient documentation

## 2020-12-05 NOTE — Addendum Note (Signed)
Addended by: Mearl Latin on: 12/05/2020 02:19 PM   Modules accepted: Orders

## 2020-12-05 NOTE — Therapy (Signed)
[place], [place], [address] Phone: [phone]   Fax:  [phone]  Physical Therapy Evaluation  Patient Details  Name: Clinton Ross MRN: [id number] Date of Birth: 1965-12-18 Referring Provider (PT): Earnie Larsson MD   Encounter Date: 12/05/2020   PT End of Session - 12/05/20 1047    Visit Number 1    Number of Visits 12    Date for PT Re-Evaluation 01/16/21    Authorization Type Primary: [facility] (15 visits approved)    Authorization - Visit Number 1    Authorization - Number of Visits 15    Progress Note Due on Visit 10    PT Start Time 7017    PT Stop Time 1043    PT Time Calculation (min) 41 min    Activity Tolerance Patient tolerated treatment well    Behavior During Therapy [facility] for tasks assessed/performed           Past Medical History:  Diagnosis Date  . Acute pancreatitis 10/2013  . Arthritis   . Bulging disc   . Cancer [facility])    Colon  . Chronic knee pain   . Colostomy in place [facility])   . Gallstones   . Hypertension   . PTSD (post-traumatic stress disorder)   . Sciatica     Past Surgical History:  Procedure Laterality Date  . CHOLECYSTECTOMY    . COLON SURGERY    . rectal cancer Right     There were no vitals filed for this visit.    Subjective Assessment - 12/05/20 1003    Subjective Patient is a 55 y.o. male who presents to physical therapy with cervical radiculopathy. Patient states he has 2 bulging discs in his neck but MD did not want to do surgery. Patient states symptoms began about 2-3 months ago but he feels that symptoms may began with chiro adjustments about 5 months ago. Patient states symptoms are in his R shoulder and up into his neck. Patient also states he fell on ice several months ago injuring his hand. Patient states symptoms worse with lifting. He was doing Deal[employer] but hasn't been able to. Patient states symptoms ease with laying on heat and meds. He doesn't work and is on  disability from the [employer]. He has not had any neck pain in the past. He has history of injury from [employer] and colon cancer.    Pertinent History PTSD, LBP, hx colon cancer    Limitations Lifting;House hold activities;Reading    Patient Stated Goals improve pain and function    Currently in Pain? Yes    Pain Score 6     Pain Location Neck    Pain Orientation Right    Pain Descriptors / Indicators Sharp    Pain Type Chronic pain    Pain Radiating Towards R shoulder    Pain Onset More than a month ago    Pain Frequency Constant              [facility] PT Assessment - 12/05/20 0001      Assessment   Medical Diagnosis Cervical Radiculopathy    Referring Provider (PT) Earnie Larsson MD    Onset Date/Surgical Date 09/25/20    Next MD Visit May    Prior Therapy LBP      Precautions   Precautions None      Restrictions   Weight Bearing Restrictions No      Balance Screen   Has the patient  fallen in the past 6 months Yes    How many times? 1    Has the patient had a decrease in activity level because of a fear of falling?  No    Is the patient reluctant to leave their home because of a fear of falling?  No      Prior Function   Level of Independence Independent    Vocation On disability      Cognition   Overall Cognitive Status Within Functional Limits for tasks assessed      Observation/Other Assessments   Observations Ambulates without AD, constantly moving/changing position, fair cervical ROM    Focus on Therapeutic Outcomes (FOTO)  58% function      Sensation   Light Touch Impaired Detail    Light Touch Impaired Details Impaired RUE    Additional Comments decreased C3, C 6 on R      ROM / Strength   AROM / PROM / Strength AROM;Strength      AROM   Overall AROM Comments decreased R shoulder due to pain    AROM Assessment Site Cervical    Cervical Flexion 25% limited, pain in lower c/sp    Cervical Extension 25% limited, pain in lower c/sp    Cervical - Right Side  Bend 25% limited, pain in lower c/sp    Cervical - Left Side Bend 25% limited, pain in lower c/sp    Cervical - Right Rotation 25% limited, pain in lower c/sp    Cervical - Left Rotation 25% limited, pain in lower c/sp      Strength   Overall Strength Comments pain in R shoulder with flexion    Strength Assessment Site Shoulder;Elbow;Wrist;Hand    Right/Left Shoulder Right;Left    Right Shoulder Flexion 3+/5    Right Shoulder ABduction 4+/5    Right Shoulder Internal Rotation 3+/5    Right Shoulder External Rotation 3+/5    Left Shoulder Flexion 5/5    Left Shoulder ABduction 5/5    Left Shoulder Internal Rotation 5/5    Left Shoulder External Rotation 5/5    Right/Left Elbow Right;Left    Right Elbow Flexion 5/5    Right Elbow Extension 5/5    Left Elbow Flexion 5/5    Left Elbow Extension 5/5    Right/Left Wrist Right;Left    Right/Left hand Right;Left                      Objective measurements completed on examination: See above findings.               PT Education - 12/05/20 1003    Education Details Patient educated on exam findings, POC, Scope of PT, probable shoulder and cervical pathology    Person(s) Educated Patient    Methods Explanation;Demonstration    Comprehension Verbalized understanding;Returned demonstration            PT Short Term Goals - 12/05/20 1225      PT SHORT TERM GOAL #1   Title Patient will be independent with HEP in order to improve functional outcomes.    Time 3    Period Weeks    Status New    Target Date 12/26/20      PT SHORT TERM GOAL #2   Title Patient will report at least 25% improvement in symptoms for improved quality of life.    Time 3    Period Weeks    Status New    Target Date  12/26/20             PT Long Term Goals - 12/05/20 1226      PT LONG TERM GOAL #1   Title Patient will report at least 75% improvement in symptoms for improved quality of life.    Time 6    Period Weeks     Status New    Target Date 01/16/21      PT LONG TERM GOAL #2   Title Patient will improve FOTO score by at least 10 points in order to indicate improved tolerance to activity.    Time 6    Period Weeks    Status New    Target Date 01/16/21      PT LONG TERM GOAL #3   Title Patient will demonstrate at least 4+/5 strength in RUE for improved ability to complete overhead tasks at home.    Time 6    Period Weeks    Status New    Target Date 01/16/21                  Plan - 12/05/20 1227    Clinical Impression Statement Patient is a 55 y.o. male who presents to physical therapy with cervical radiculopathy. Patient symptoms appear to be related to cervical pathology and R shoulder pathology. He presents with pain limited deficits in cervical and R shoulder strength, ROM,  postural impairments, spinal mobility and functional mobility with ADL. He is having to modify and restrict ADL as indicated by FOTO score as well as subjective information and objective measures which is affecting overall participation. Patient will benefit from skilled physical therapy in order to improve function and reduce impairment.    Personal Factors and Comorbidities Behavior Pattern;Comorbidity 2;Past/Current Experience    Comorbidities PTSD, HTN    Examination-Activity Limitations Carry;Lift;Reach Overhead    Examination-Participation Restrictions Cleaning;Occupation;Community Activity;Volunteer;Yard Work;Shop    Stability/Clinical Decision Making Stable/Uncomplicated    Clinical Decision Making Low    Rehab Potential Good    PT Frequency 2x / week    PT Duration 6 weeks    PT Treatment/Interventions ADLs/Self Care Home Management;Aquatic Therapy;Cryotherapy;Electrical Stimulation;Iontophoresis 4mg /ml Dexamethasone;Moist Heat;Traction;Ultrasound;DME Instruction;Functional mobility training;Stair training;Gait training;Therapeutic exercise;Balance training;Therapeutic activities;Patient/family  education;Neuromuscular re-education;Manual techniques;Compression bandaging;Dry needling;Energy conservation;Splinting;Taping;Spinal Manipulations;Joint Manipulations    PT Next Visit Plan further assess cervical spine mobility/palpation, begin cervical retractions, postural strengthening, RC iso and shoulder strength, possibly manual to c/sp and shoulder for pain/mobility    Consulted and Agree with Plan of Care Patient           Patient will benefit from skilled therapeutic intervention in order to improve the following deficits and impairments:  Impaired UE functional use,Decreased activity tolerance,Pain,Improper body mechanics,Decreased mobility,Decreased strength  Visit Diagnosis: Cervicalgia  Right shoulder pain, unspecified chronicity  Muscle weakness (generalized)  Other symptoms and signs involving the musculoskeletal system     Problem List Patient Active Problem List   Diagnosis Date Noted  . Pancreatitis 06/08/2018  . Chest pain 07/21/2016  . Cholelithiasis 11/28/2013  . Tobacco abuse 11/28/2013  . Abdominal pain 11/27/2013  . Acute pancreatitis 11/20/2013  . Constipation 11/20/2013  . HTN (hypertension), benign 11/20/2013  . PTSD (post-traumatic stress disorder) 11/20/2013  . Sciatica 05/24/2009  . DERANGEMENT MENISCUS 10/24/2008  . JOINT EFFUSION, KNEE 10/24/2008  . KNEE PAIN 10/24/2008    2:17 PM, 12/05/20 Mearl Latin PT, DPT Physical Therapist at [place] [address]  [place], [place], [address] Phone: [phone]   Fax:  [phone]  Name: RADLEY BARTO MRN: [id number] Date of Birth: 09/10/1966

## 2020-12-06 ENCOUNTER — Ambulatory Visit (HOSPITAL_COMMUNITY): Payer: No Typology Code available for payment source | Admitting: Physical Therapy

## 2020-12-06 ENCOUNTER — Encounter (HOSPITAL_COMMUNITY): Payer: Self-pay | Admitting: Physical Therapy

## 2020-12-06 DIAGNOSIS — R29898 Other symptoms and signs involving the musculoskeletal system: Secondary | ICD-10-CM

## 2020-12-06 DIAGNOSIS — M6281 Muscle weakness (generalized): Secondary | ICD-10-CM

## 2020-12-06 DIAGNOSIS — M25511 Pain in right shoulder: Secondary | ICD-10-CM | POA: Diagnosis not present

## 2020-12-06 DIAGNOSIS — M542 Cervicalgia: Secondary | ICD-10-CM | POA: Diagnosis not present

## 2020-12-06 NOTE — Patient Instructions (Signed)
Access Code: [email or web address] URL: [email or web address] Date: 12/06/2020 Prepared by: Mitzi Hansen Kache Mcclurg  Exercises Supine Chin Tuck - 1 x daily - 7 x weekly - 2 sets - 10 reps Standing Shoulder Row with Anchored Resistance - 1 x daily - 7 x weekly - 2 sets - 15 reps Shoulder extension with resistance - Neutral - 1 x daily - 7 x weekly - 2 sets - 15 reps

## 2020-12-06 NOTE — Therapy (Signed)
[place], [place], [address] Phone: [phone]   Fax:  [phone]  Physical Therapy Treatment  Patient Details  Name: Clinton Ross MRN: [id number] Date of Birth: 28-Oct-1965 Referring Provider (PT): Earnie Larsson MD   Encounter Date: 12/06/2020   PT End of Session - 12/06/20 1535    Visit Number 2    Number of Visits 12    Date for PT Re-Evaluation 01/16/21    Authorization Type Primary: [facility] (15 visits approved)    Authorization - Visit Number 2    Authorization - Number of Visits 15    Progress Note Due on Visit 10    PT Start Time 2836    PT Stop Time 1613    PT Time Calculation (min) 39 min    Activity Tolerance Patient tolerated treatment well    Behavior During Therapy [facility] for tasks assessed/performed           Past Medical History:  Diagnosis Date  . Acute pancreatitis 10/2013  . Arthritis   . Bulging disc   . Cancer [facility])    Colon  . Chronic knee pain   . Colostomy in place [facility])   . Gallstones   . Hypertension   . PTSD (post-traumatic stress disorder)   . Sciatica     Past Surgical History:  Procedure Laterality Date  . CHOLECYSTECTOMY    . COLON SURGERY    . rectal cancer Right     There were no vitals filed for this visit.   Subjective Assessment - 12/06/20 1535    Subjective Patient states he was not too sore yesterday.    Pertinent History PTSD, LBP, hx colon cancer    Limitations Lifting;House hold activities;Reading    Patient Stated Goals improve pain and function    Currently in Pain? Yes    Pain Score 4     Pain Location Neck    Pain Orientation Right;Lower    Pain Descriptors / Indicators Sharp    Pain Type Chronic pain    Pain Radiating Towards R shoulder    Pain Onset More than a month ago              [facility] PT Assessment - 12/06/20 0001      Palpation   Spinal mobility hypomobile and tender cervical spine    Palpation comment TTP cervical paraspinals                          [facility] Adult PT Treatment/Exercise - 12/06/20 0001      Exercises   Exercises Neck      Neck Exercises: Standing   Other Standing Exercises Row and extension 2x 15 each blue band      Neck Exercises: Seated   Other Seated Exercise thoracic extension over chair 10x 5 second holds      Neck Exercises: Supine   Neck Retraction 10 reps;3 secs    Neck Retraction Limitations 2 sets                  PT Education - 12/06/20 1539    Education Details HEP, exercise mechanics    Person(s) Educated Patient    Methods Explanation;Demonstration    Comprehension Verbalized understanding;Returned demonstration            PT Short Term Goals - 12/05/20 1225      PT SHORT TERM GOAL #1   Title Patient  will be independent with HEP in order to improve functional outcomes.    Time 3    Period Weeks    Status New    Target Date 12/26/20      PT SHORT TERM GOAL #2   Title Patient will report at least 25% improvement in symptoms for improved quality of life.    Time 3    Period Weeks    Status New    Target Date 12/26/20             PT Long Term Goals - 12/05/20 1226      PT LONG TERM GOAL #1   Title Patient will report at least 75% improvement in symptoms for improved quality of life.    Time 6    Period Weeks    Status New    Target Date 01/16/21      PT LONG TERM GOAL #2   Title Patient will improve FOTO score by at least 10 points in order to indicate improved tolerance to activity.    Time 6    Period Weeks    Status New    Target Date 01/16/21      PT LONG TERM GOAL #3   Title Patient will demonstrate at least 4+/5 strength in RUE for improved ability to complete overhead tasks at home.    Time 6    Period Weeks    Status New    Target Date 01/16/21                 Plan - 12/06/20 1535    Clinical Impression Statement Patient very tender throughout cervical spine and hypomobile. Patient requires verbal cueing  and demonstration for completion of cervical retraction in supine and intensity of contraction. Patient tolerates postural strengthening with cueing for limiting compensation. Patient will continue to benefit from skilled physical therapy in order to reduce impairment and improve function.    Personal Factors and Comorbidities Behavior Pattern;Comorbidity 2;Past/Current Experience    Comorbidities PTSD, HTN    Examination-Activity Limitations Carry;Lift;Reach Overhead    Examination-Participation Restrictions Cleaning;Occupation;Community Activity;Volunteer;Yard Work;Shop    Stability/Clinical Decision Making Stable/Uncomplicated    Rehab Potential Good    PT Frequency 2x / week    PT Duration 6 weeks    PT Treatment/Interventions ADLs/Self Care Home Management;Aquatic Therapy;Cryotherapy;Electrical Stimulation;Iontophoresis 4mg /ml Dexamethasone;Moist Heat;Traction;Ultrasound;DME Instruction;Functional mobility training;Stair training;Gait training;Therapeutic exercise;Balance training;Therapeutic activities;Patient/family education;Neuromuscular re-education;Manual techniques;Compression bandaging;Dry needling;Energy conservation;Splinting;Taping;Spinal Manipulations;Joint Manipulations    PT Next Visit Plan f/u with HEP, continue postural strengthening, possibly begin RC iso and shoulder strength, possibly manual to c/sp and shoulder for pain/mobility    PT Home Exercise Plan cervical retraction, row, extension with band    Consulted and Agree with Plan of Care Patient           Patient will benefit from skilled therapeutic intervention in order to improve the following deficits and impairments:  Impaired UE functional use,Decreased activity tolerance,Pain,Improper body mechanics,Decreased mobility,Decreased strength  Visit Diagnosis: Cervicalgia  Right shoulder pain, unspecified chronicity  Muscle weakness (generalized)  Other symptoms and signs involving the musculoskeletal  system     Problem List Patient Active Problem List   Diagnosis Date Noted  . Pancreatitis 06/08/2018  . Chest pain 07/21/2016  . Cholelithiasis 11/28/2013  . Tobacco abuse 11/28/2013  . Abdominal pain 11/27/2013  . Acute pancreatitis 11/20/2013  . Constipation 11/20/2013  . HTN (hypertension), benign 11/20/2013  . PTSD (post-traumatic stress disorder) 11/20/2013  . Sciatica 05/24/2009  . DERANGEMENT MENISCUS  10/24/2008  . JOINT EFFUSION, KNEE 10/24/2008  . KNEE PAIN 10/24/2008    4:23 PM, 12/06/20 Mearl Latin PT, DPT Physical Therapist at [place], [place], [address] Phone: [phone]   Fax:  [phone]  Name: Clinton Ross MRN: [id number] Date of Birth: November 05, 1965

## 2020-12-12 ENCOUNTER — Encounter (HOSPITAL_COMMUNITY): Payer: Self-pay | Admitting: Physical Therapy

## 2020-12-12 ENCOUNTER — Ambulatory Visit (HOSPITAL_COMMUNITY): Payer: No Typology Code available for payment source | Admitting: Physical Therapy

## 2020-12-12 ENCOUNTER — Other Ambulatory Visit: Payer: Self-pay

## 2020-12-12 DIAGNOSIS — R29898 Other symptoms and signs involving the musculoskeletal system: Secondary | ICD-10-CM | POA: Diagnosis not present

## 2020-12-12 DIAGNOSIS — M6281 Muscle weakness (generalized): Secondary | ICD-10-CM

## 2020-12-12 DIAGNOSIS — M542 Cervicalgia: Secondary | ICD-10-CM | POA: Diagnosis not present

## 2020-12-12 DIAGNOSIS — M25511 Pain in right shoulder: Secondary | ICD-10-CM | POA: Diagnosis not present

## 2020-12-12 NOTE — Therapy (Signed)
[place], [place], [address] Phone: [phone]   Fax:  [phone]  Physical Therapy Treatment  Patient Details  Name: Clinton Ross MRN: [id number] Date of Birth: [birth year] Referring Provider (PT): Earnie Larsson MD   Encounter Date: 12/12/2020   PT End of Session - 12/12/20 1639    Visit Number 3    Number of Visits 12    Date for PT Re-Evaluation 01/16/21    Authorization Type Primary: [facility] (15 visits approved)    Authorization - Visit Number 3    Authorization - Number of Visits 15    Progress Note Due on Visit 10    PT Start Time 1623    PT Stop Time 1702    PT Time Calculation (min) 39 min    Activity Tolerance Patient tolerated treatment well    Behavior During Therapy [facility] for tasks assessed/performed           Past Medical History:  Diagnosis Date  . Acute pancreatitis 10/2013  . Arthritis   . Bulging disc   . Cancer [facility])    Colon  . Chronic knee pain   . Colostomy in place [facility])   . Gallstones   . Hypertension   . PTSD (post-traumatic stress disorder)   . Sciatica     Past Surgical History:  Procedure Laterality Date  . CHOLECYSTECTOMY    . COLON SURGERY    . rectal cancer Right     There were no vitals filed for this visit.   Subjective Assessment - 12/12/20 1626    Subjective PT states that he has the most pain at night.    Pertinent History PTSD, LBP, hx colon cancer    Limitations Lifting;House hold activities;Reading    Patient Stated Goals improve pain and function    Currently in Pain? Yes    Pain Score 6     Pain Location Neck    Pain Orientation Left;Right    Pain Descriptors / Indicators Aching;Throbbing;Dull    Pain Type Chronic pain    Pain Onset More than a month ago    Pain Frequency Constant                   [facility] Adult PT Treatment/Exercise - 12/12/20 0001      Exercises   Exercises Neck      Neck Exercises: Seated   Cervical Isometrics  Extension;Right lateral flexion;Left lateral flexion;3 secs;5 reps    Neck Retraction 5 reps    W Back 10 reps    Shoulder Rolls Backwards;10 reps    Other Seated Exercise cervical and thoracic excursions x 3 Each    Other Seated Exercise scapular retraction  x 10      Manual Therapy   Manual Therapy Soft tissue mobilization    Manual therapy comments completed seperate from all other aspects of treatment.    Soft tissue mobilization efflurage / pettrisage.                    PT Short Term Goals - 12/05/20 1225      PT SHORT TERM GOAL #1   Title Patient will be independent with HEP in order to improve functional outcomes.    Time 3    Period Weeks    Status New    Target Date 12/26/20      PT SHORT TERM GOAL #2   Title Patient will report at least 25% improvement  in symptoms for improved quality of life.    Time 3    Period Weeks    Status New    Target Date 12/26/20             PT Long Term Goals - 12/05/20 1226      PT LONG TERM GOAL #1   Title Patient will report at least 75% improvement in symptoms for improved quality of life.    Time 6    Period Weeks    Status New    Target Date 01/16/21      PT LONG TERM GOAL #2   Title Patient will improve FOTO score by at least 10 points in order to indicate improved tolerance to activity.    Time 6    Period Weeks    Status New    Target Date 01/16/21      PT LONG TERM GOAL #3   Title Patient will demonstrate at least 4+/5 strength in RUE for improved ability to complete overhead tasks at home.    Time 6    Period Weeks    Status New    Target Date 01/16/21                 Plan - 12/12/20 1707    Clinical Impression Statement Added manual today with moderate mm spasm noted in Rt upper trap.  After massage pt states Rt shoulder pain was down to a 2 and he was no longer experiencing tingling in his ring and little finger.  Therapists instructed pt in isometric as well as cervical excursions fosr  both ROM and stability.    Personal Factors and Comorbidities Behavior Pattern;Comorbidity 2;Past/Current Experience    Comorbidities PTSD, HTN    Examination-Activity Limitations Carry;Lift;Reach Overhead    Examination-Participation Restrictions Cleaning;Occupation;Community Activity;Volunteer;Yard Work;Shop    Stability/Clinical Decision Making Stable/Uncomplicated    Rehab Potential Good    PT Frequency 2x / week    PT Duration 6 weeks    PT Treatment/Interventions ADLs/Self Care Home Management;Aquatic Therapy;Cryotherapy;Electrical Stimulation;Iontophoresis 4mg /ml Dexamethasone;Moist Heat;Traction;Ultrasound;DME Instruction;Functional mobility training;Stair training;Gait training;Therapeutic exercise;Balance training;Therapeutic activities;Patient/family education;Neuromuscular re-education;Manual techniques;Compression bandaging;Dry needling;Energy conservation;Splinting;Taping;Spinal Manipulations;Joint Manipulations    PT Next Visit Plan continue with manual and postural strengthening, possibly begin RC iso and shoulder strength,    PT Home Exercise Plan cervical retraction, row, extension with band;' cervical sidebend; isometric for sidebend and cervical extension    Consulted and Agree with Plan of Care Patient           Patient will benefit from skilled therapeutic intervention in order to improve the following deficits and impairments:  Impaired UE functional use,Decreased activity tolerance,Pain,Improper body mechanics,Decreased mobility,Decreased strength  Visit Diagnosis: Cervicalgia  Right shoulder pain, unspecified chronicity  Muscle weakness (generalized)  Other symptoms and signs involving the musculoskeletal system     Problem List Patient Active Problem List   Diagnosis Date Noted  . Pancreatitis 06/08/2018  . Chest pain 07/21/2016  . Cholelithiasis 11/28/2013  . Tobacco abuse 11/28/2013  . Abdominal pain 11/27/2013  . Acute pancreatitis 11/20/2013  .  Constipation 11/20/2013  . HTN (hypertension), benign 11/20/2013  . PTSD (post-traumatic stress disorder) 11/20/2013  . Sciatica 05/24/2009  . DERANGEMENT MENISCUS 10/24/2008  . JOINT EFFUSION, KNEE 10/24/2008  . KNEE PAIN 10/24/2008    Rayetta Humphrey, PT CLT [phone] 12/12/2020, 5:11 PM  [place] [address] [place], [place], [address] Phone: [phone]   Fax:  [phone]  Name: Burris Matherne  [place] MRN: [id number] Date of Birth: 01/18/1966

## 2020-12-13 ENCOUNTER — Ambulatory Visit (HOSPITAL_COMMUNITY): Payer: No Typology Code available for payment source

## 2020-12-13 ENCOUNTER — Encounter (HOSPITAL_COMMUNITY): Payer: Self-pay

## 2020-12-13 DIAGNOSIS — M25511 Pain in right shoulder: Secondary | ICD-10-CM

## 2020-12-13 DIAGNOSIS — R29898 Other symptoms and signs involving the musculoskeletal system: Secondary | ICD-10-CM

## 2020-12-13 DIAGNOSIS — M542 Cervicalgia: Secondary | ICD-10-CM | POA: Diagnosis not present

## 2020-12-13 DIAGNOSIS — M6281 Muscle weakness (generalized): Secondary | ICD-10-CM

## 2020-12-13 NOTE — Therapy (Signed)
[facility] [employer], [place], [address] Phone: [phone]   Fax:  [phone]  Physical Therapy Treatment  Patient Details  Name: Clinton Ross MRN: [id number] Date of Birth: [birth year] Referring Provider (PT): Earnie Larsson MD   Encounter Date: 12/13/2020   PT End of Session - 12/13/20 1624    Visit Number 4    Number of Visits 12    Date for PT Re-Evaluation 01/16/21    Authorization Type Primary: [facility] (15 visits approved)    Authorization - Visit Number 4    Authorization - Number of Visits 15    Progress Note Due on Visit 10    PT Start Time 7619    PT Stop Time 1700    PT Time Calculation (min) 42 min    Activity Tolerance Patient tolerated treatment well;No increased pain    Behavior During Therapy WFL for tasks assessed/performed           Past Medical History:  Diagnosis Date  . Acute pancreatitis 10/2013  . Arthritis   . Bulging disc   . Cancer [facility])    Colon  . Chronic knee pain   . Colostomy in place [facility])   . Gallstones   . Hypertension   . PTSD (post-traumatic stress disorder)   . Sciatica     Past Surgical History:  Procedure Laterality Date  . CHOLECYSTECTOMY    . COLON SURGERY    . rectal cancer Right     There were no vitals filed for this visit.   Subjective Assessment - 12/13/20 1621    Subjective Pt stated the massage helped for 5-6 hours following last session.  Reports soreness following exercises wiht pain scale 4-5/10.    Pertinent History PTSD, LBP, hx colon cancer    Patient Stated Goals improve pain and function    Currently in Pain? Yes    Pain Score 5     Pain Location Back    Pain Orientation Lower    Pain Descriptors / Indicators Aching;Sore    Pain Type Chronic pain    Pain Onset More than a month ago    Pain Frequency Constant    Aggravating Factors  standing on hard surfaces    Pain Relieving Factors MHP              [facility] PT Assessment - 12/13/20 0001       Assessment   Medical Diagnosis Cervical Radiculopathy    Referring Provider (PT) Earnie Larsson MD    Onset Date/Surgical Date 09/25/20    Next MD Visit May 2022    Prior Therapy LBP                         [facility] Adult PT Treatment/Exercise - 12/13/20 0001      Exercises   Exercises Neck      Neck Exercises: Standing   Other Standing Exercises wall arch 5x with chin tuck      Neck Exercises: Seated   Neck Retraction 10 reps;3 secs    W Back 10 reps    Other Seated Exercise cervical and thoracic excursions x 3 Each      Manual Therapy   Manual Therapy Soft tissue mobilization    Manual therapy comments completed seperate from all other aspects of treatment.    Soft tissue mobilization efflurage / pettrisage to cervical mm in seated position  PT Short Term Goals - 12/05/20 1225      PT SHORT TERM GOAL #1   Title Patient will be independent with HEP in order to improve functional outcomes.    Time 3    Period Weeks    Status New    Target Date 12/26/20      PT SHORT TERM GOAL #2   Title Patient will report at least 25% improvement in symptoms for improved quality of life.    Time 3    Period Weeks    Status New    Target Date 12/26/20             PT Long Term Goals - 12/05/20 1226      PT LONG TERM GOAL #1   Title Patient will report at least 75% improvement in symptoms for improved quality of life.    Time 6    Period Weeks    Status New    Target Date 01/16/21      PT LONG TERM GOAL #2   Title Patient will improve FOTO score by at least 10 points in order to indicate improved tolerance to activity.    Time 6    Period Weeks    Status New    Target Date 01/16/21      PT LONG TERM GOAL #3   Title Patient will demonstrate at least 4+/5 strength in RUE for improved ability to complete overhead tasks at home.    Time 6    Period Weeks    Status New    Target Date 01/16/21                 Plan - 12/13/20  1705    Clinical Impression Statement Continued spinal mobility and educated with importance of posture for pain control.  Added thoracic excursion and chin tucks to HEP.  Added wall arch for postural strengthening with min cueing for proper form and mechanics.  EOS with manual to address moderate spasm on Rt upper trap with reoprts of pain reduced to 2/10 at EOS.    Personal Factors and Comorbidities Behavior Pattern;Comorbidity 2;Past/Current Experience    Comorbidities PTSD, HTN    Examination-Activity Limitations Carry;Lift;Reach Overhead    Examination-Participation Restrictions Cleaning;Occupation;Community Activity;Volunteer;Yard Work;Shop    Stability/Clinical Decision Making Stable/Uncomplicated    Clinical Decision Making Low    Rehab Potential Good    PT Frequency 2x / week    PT Duration 6 weeks    PT Treatment/Interventions ADLs/Self Care Home Management;Aquatic Therapy;Cryotherapy;Electrical Stimulation;Iontophoresis 4mg /ml Dexamethasone;Moist Heat;Traction;Ultrasound;DME Instruction;Functional mobility training;Stair training;Gait training;Therapeutic exercise;Balance training;Therapeutic activities;Patient/family education;Neuromuscular re-education;Manual techniques;Compression bandaging;Dry needling;Energy conservation;Splinting;Taping;Spinal Manipulations;Joint Manipulations    PT Next Visit Plan continue with manual and postural strengthening, possibly begin RC iso and shoulder strength,    PT Home Exercise Plan cervical retraction, row, extension with band;' cervical sidebend; isometric for sidebend and cervical extension    Consulted and Agree with Plan of Care Patient           Patient will benefit from skilled therapeutic intervention in order to improve the following deficits and impairments:  Impaired UE functional use,Decreased activity tolerance,Pain,Improper body mechanics,Decreased mobility,Decreased strength  Visit Diagnosis: Cervicalgia  Right shoulder pain,  unspecified chronicity  Muscle weakness (generalized)  Other symptoms and signs involving the musculoskeletal system     Problem List Patient Active Problem List   Diagnosis Date Noted  . Pancreatitis 06/08/2018  . Chest pain 07/21/2016  . Cholelithiasis 11/28/2013  . Tobacco abuse 11/28/2013  .  Abdominal pain 11/27/2013  . Acute pancreatitis 11/20/2013  . Constipation 11/20/2013  . HTN (hypertension), benign 11/20/2013  . PTSD (post-traumatic stress disorder) 11/20/2013  . Sciatica 05/24/2009  . DERANGEMENT MENISCUS 10/24/2008  . JOINT EFFUSION, KNEE 10/24/2008  . KNEE PAIN 10/24/2008   Ihor Austin, LPTA/CLT; CBIS [phone] Aldona Lento 12/13/2020, 5:43 PM  [place], [place], [address] Phone: [phone]   Fax:  [phone]  Name: Clinton Ross MRN: [id number] Date of Birth: Feb 19, 1966

## 2020-12-19 ENCOUNTER — Other Ambulatory Visit: Payer: Self-pay

## 2020-12-19 ENCOUNTER — Ambulatory Visit (HOSPITAL_COMMUNITY): Payer: No Typology Code available for payment source | Admitting: Physical Therapy

## 2020-12-19 DIAGNOSIS — R29898 Other symptoms and signs involving the musculoskeletal system: Secondary | ICD-10-CM | POA: Diagnosis not present

## 2020-12-19 DIAGNOSIS — M6281 Muscle weakness (generalized): Secondary | ICD-10-CM | POA: Diagnosis not present

## 2020-12-19 DIAGNOSIS — M25511 Pain in right shoulder: Secondary | ICD-10-CM

## 2020-12-19 DIAGNOSIS — M542 Cervicalgia: Secondary | ICD-10-CM

## 2020-12-19 NOTE — Therapy (Signed)
[place], [place], [address] Phone: [phone]   Fax:  [phone]  Physical Therapy Treatment  Patient Details  Name: Clinton Ross MRN: [id number] Date of Birth: 16-Jun-1966 Referring Provider (PT): Earnie Larsson MD   Encounter Date: 12/19/2020   PT End of Session - 12/19/20 1011    Visit Number 5    Number of Visits 12    Date for PT Re-Evaluation 01/16/21    Authorization Type Primary: [facility] (15 visits approved)    Authorization - Visit Number 5    Authorization - Number of Visits 15    Progress Note Due on Visit 10    PT Start Time 0920    PT Stop Time 0958    PT Time Calculation (min) 38 min    Activity Tolerance Patient tolerated treatment well;No increased pain    Behavior During Therapy WFL for tasks assessed/performed           Past Medical History:  Diagnosis Date  . Acute pancreatitis 10/2013  . Arthritis   . Bulging disc   . Cancer [facility])    Colon  . Chronic knee pain   . Colostomy in place [facility])   . Gallstones   . Hypertension   . PTSD (post-traumatic stress disorder)   . Sciatica     Past Surgical History:  Procedure Laterality Date  . CHOLECYSTECTOMY    . COLON SURGERY    . rectal cancer Right     There were no vitals filed for this visit.   Subjective Assessment - 12/19/20 0923    Subjective pt states his pain stays around 4/10 "on a good day".  reports compliance with HEP.    Currently in Pain? Yes    Pain Score 4     Pain Location Neck    Pain Descriptors / Indicators Aching;Sore    Pain Type Chronic pain    Pain Radiating Towards Rt shoulder    Pain Onset More than a month ago    Pain Frequency Constant                             [facility] Adult PT Treatment/Exercise - 12/19/20 0001      Neck Exercises: Standing   Other Standing Exercises wall arch 10x with chin tuck    Other Standing Exercises UE flexion against wall 10X      Neck Exercises: Seated    Other Seated Exercise thoracic excursions with UE movements x 5 Each      Manual Therapy   Manual Therapy Soft tissue mobilization    Manual therapy comments completed seperate from all other aspects of treatment.    Soft tissue mobilization efflurage / pettrisage to cervical mm in seated position      Neck Exercises: Stretches   Corner Stretch 3 reps;30 seconds                    PT Short Term Goals - 12/05/20 1225      PT SHORT TERM GOAL #1   Title Patient will be independent with HEP in order to improve functional outcomes.    Time 3    Period Weeks    Status New    Target Date 12/26/20      PT SHORT TERM GOAL #2   Title Patient will report at least 25% improvement in symptoms for improved quality of life.  Time 3    Period Weeks    Status New    Target Date 12/26/20             PT Long Term Goals - 12/05/20 1226      PT LONG TERM GOAL #1   Title Patient will report at least 75% improvement in symptoms for improved quality of life.    Time 6    Period Weeks    Status New    Target Date 01/16/21      PT LONG TERM GOAL #2   Title Patient will improve FOTO score by at least 10 points in order to indicate improved tolerance to activity.    Time 6    Period Weeks    Status New    Target Date 01/16/21      PT LONG TERM GOAL #3   Title Patient will demonstrate at least 4+/5 strength in RUE for improved ability to complete overhead tasks at home.    Time 6    Period Weeks    Status New    Target Date 01/16/21                 Plan - 12/19/20 1013    Clinical Impression Statement Pt completed most of his sitting exercises while in waiting room; reports slight improvement but still frustrated with the radiating pain and functional limitaitons.  completed thoracic excursions with UE movements with Rt UE flexion being most challenging due to anterior shoulder discomfort.  Manual completed with concentration on Rt Upper trap and scap regions.  Two  large spasms abolished with reports of reduction of pain at end of session.    Personal Factors and Comorbidities Behavior Pattern;Comorbidity 2;Past/Current Experience    Comorbidities PTSD, HTN    Examination-Activity Limitations Carry;Lift;Reach Overhead    Examination-Participation Restrictions Cleaning;Occupation;Community Activity;Volunteer;Yard Work;Shop    Stability/Clinical Decision Making Stable/Uncomplicated    Rehab Potential Good    PT Frequency 2x / week    PT Duration 6 weeks    PT Treatment/Interventions ADLs/Self Care Home Management;Aquatic Therapy;Cryotherapy;Electrical Stimulation;Iontophoresis 4mg /ml Dexamethasone;Moist Heat;Traction;Ultrasound;DME Instruction;Functional mobility training;Stair training;Gait training;Therapeutic exercise;Balance training;Therapeutic activities;Patient/family education;Neuromuscular re-education;Manual techniques;Compression bandaging;Dry needling;Energy conservation;Splinting;Taping;Spinal Manipulations;Joint Manipulations    PT Next Visit Plan continue with manual and postural strengthening, possibly begin RC iso and shoulder strength.    PT Home Exercise Plan cervical retraction, row, extension with band;' cervical sidebend; isometric for sidebend and cervical extension    Consulted and Agree with Plan of Care Patient           Patient will benefit from skilled therapeutic intervention in order to improve the following deficits and impairments:  Impaired UE functional use,Decreased activity tolerance,Pain,Improper body mechanics,Decreased mobility,Decreased strength  Visit Diagnosis: Muscle weakness (generalized)  Other symptoms and signs involving the musculoskeletal system  Right shoulder pain, unspecified chronicity  Cervicalgia     Problem List Patient Active Problem List   Diagnosis Date Noted  . Pancreatitis 06/08/2018  . Chest pain 07/21/2016  . Cholelithiasis 11/28/2013  . Tobacco abuse 11/28/2013  . Abdominal  pain 11/27/2013  . Acute pancreatitis 11/20/2013  . Constipation 11/20/2013  . HTN (hypertension), benign 11/20/2013  . PTSD (post-traumatic stress disorder) 11/20/2013  . Sciatica 05/24/2009  . DERANGEMENT MENISCUS 10/24/2008  . JOINT EFFUSION, KNEE 10/24/2008  . KNEE PAIN 10/24/2008   Teena Irani, PTA/CLT [phone]  Teena Irani 12/19/2020, 10:39 AM  [place] [address] [place], [place], [address] Phone: [phone]  Fax:  [phone]  Name: Clinton Ross MRN: [id number] Date of Birth: 03-31-1966

## 2020-12-21 ENCOUNTER — Ambulatory Visit (HOSPITAL_COMMUNITY): Payer: No Typology Code available for payment source | Admitting: Physical Therapy

## 2020-12-21 ENCOUNTER — Other Ambulatory Visit: Payer: Self-pay

## 2020-12-21 ENCOUNTER — Encounter (HOSPITAL_COMMUNITY): Payer: Self-pay | Admitting: Physical Therapy

## 2020-12-21 DIAGNOSIS — M25511 Pain in right shoulder: Secondary | ICD-10-CM

## 2020-12-21 DIAGNOSIS — R29898 Other symptoms and signs involving the musculoskeletal system: Secondary | ICD-10-CM

## 2020-12-21 DIAGNOSIS — M542 Cervicalgia: Secondary | ICD-10-CM

## 2020-12-21 DIAGNOSIS — M6281 Muscle weakness (generalized): Secondary | ICD-10-CM

## 2020-12-21 NOTE — Therapy (Signed)
[place], [place], [address] Phone: [phone]   Fax:  [phone]  Physical Therapy Treatment  Patient Details  Name: Clinton Ross MRN: [id number] Date of Birth: 1966-07-06 Referring Provider (PT): Earnie Larsson MD   Encounter Date: 12/21/2020   PT End of Session - 12/21/20 1309    Visit Number 6    Number of Visits 12    Date for PT Re-Evaluation 01/16/21    Authorization Type Primary: [facility] (15 visits approved)    Authorization - Visit Number 6    Authorization - Number of Visits 15    Progress Note Due on Visit 10    PT Start Time 3299    PT Stop Time 2426    PT Time Calculation (min) 40 min    Activity Tolerance Patient tolerated treatment well;No increased pain    Behavior During Therapy WFL for tasks assessed/performed           Past Medical History:  Diagnosis Date  . Acute pancreatitis 10/2013  . Arthritis   . Bulging disc   . Cancer [facility])    Colon  . Chronic knee pain   . Colostomy in place [facility])   . Gallstones   . Hypertension   . PTSD (post-traumatic stress disorder)   . Sciatica     Past Surgical History:  Procedure Laterality Date  . CHOLECYSTECTOMY    . COLON SURGERY    . rectal cancer Right     There were no vitals filed for this visit.   Subjective Assessment - 12/21/20 1311    Subjective Patient states his neck is doing a bit better. It has been better since massage the other day. he cant get comfortable at night. Patient states pain 3-4/10 during the day and 6-7/10 at night.    Pertinent History PTSD, LBP, hx colon cancer    Limitations Lifting;House hold activities;Reading    Patient Stated Goals improve pain and function    Currently in Pain? Yes    Pain Score 4     Pain Location Neck    Pain Orientation Lower    Pain Descriptors / Indicators Aching;Sore    Pain Type Chronic pain    Pain Onset More than a month ago    Pain Frequency Constant                              [facility] Adult PT Treatment/Exercise - 12/21/20 0001      Neck Exercises: Standing   Other Standing Exercises wall arch 10x with chin tuck    Other Standing Exercises UE flexion against wall 10X      Neck Exercises: Supine   Other Supine Exercise shoulder flexion with band between hands 1x 10 green band, GH ER with scap ret 1x 10 green; horizontal abduction with green band 1x10, PNF D2 1x 10 bilateral green band      Neck Exercises: Stretches   Chest Stretch 3 reps;20 seconds;Limitations    Chest Stretch Limitations in doorway                  PT Education - 12/21/20 1311    Education Details HEP, exercise mechanics    Person(s) Educated Patient    Methods Explanation;Demonstration    Comprehension Verbalized understanding;Returned demonstration            PT Short Term Goals - 12/05/20 1225  PT SHORT TERM GOAL #1   Title Patient will be independent with HEP in order to improve functional outcomes.    Time 3    Period Weeks    Status New    Target Date 12/26/20      PT SHORT TERM GOAL #2   Title Patient will report at least 25% improvement in symptoms for improved quality of life.    Time 3    Period Weeks    Status New    Target Date 12/26/20             PT Long Term Goals - 12/05/20 1226      PT LONG TERM GOAL #1   Title Patient will report at least 75% improvement in symptoms for improved quality of life.    Time 6    Period Weeks    Status New    Target Date 01/16/21      PT LONG TERM GOAL #2   Title Patient will improve FOTO score by at least 10 points in order to indicate improved tolerance to activity.    Time 6    Period Weeks    Status New    Target Date 01/16/21      PT LONG TERM GOAL #3   Title Patient will demonstrate at least 4+/5 strength in RUE for improved ability to complete overhead tasks at home.    Time 6    Period Weeks    Status New    Target Date 01/16/21                  Plan - 12/21/20 1310    Clinical Impression Statement Patient with c/o shoulder pain initially with shoulder flexion with band for increased RC activation which improves moderately with cueing for change in hand position. Patient tolerates all supine shoulder exercises well besides PNF D2 pattern on RUE which increases symptoms. Patient educated on not completing if painful at home or completing in pain free limited range. Patient will continue to benefit from skilled physical therapy in order to reduce impairment and improve function.    Personal Factors and Comorbidities Behavior Pattern;Comorbidity 2;Past/Current Experience    Comorbidities PTSD, HTN    Examination-Activity Limitations Carry;Lift;Reach Overhead    Examination-Participation Restrictions Cleaning;Occupation;Community Activity;Volunteer;Yard Work;Shop    Stability/Clinical Decision Making Stable/Uncomplicated    Rehab Potential Good    PT Frequency 2x / week    PT Duration 6 weeks    PT Treatment/Interventions ADLs/Self Care Home Management;Aquatic Therapy;Cryotherapy;Electrical Stimulation;Iontophoresis 56m/ml Dexamethasone;Moist Heat;Traction;Ultrasound;DME Instruction;Functional mobility training;Stair training;Gait training;Therapeutic exercise;Balance training;Therapeutic activities;Patient/family education;Neuromuscular re-education;Manual techniques;Compression bandaging;Dry needling;Energy conservation;Splinting;Taping;Spinal Manipulations;Joint Manipulations    PT Next Visit Plan continue with manual and postural strengthening, possibly begin RC iso and shoulder strength.    PT Home Exercise Plan cervical retraction, row, extension with band;' cervical sidebend; isometric for sidebend and cervical extension, decompression with band    Consulted and Agree with Plan of Care Patient           Patient will benefit from skilled therapeutic intervention in order to improve the following deficits and  impairments:  Impaired UE functional use,Decreased activity tolerance,Pain,Improper body mechanics,Decreased mobility,Decreased strength  Visit Diagnosis: Cervicalgia  Right shoulder pain, unspecified chronicity  Other symptoms and signs involving the musculoskeletal system  Muscle weakness (generalized)     Problem List Patient Active Problem List   Diagnosis Date Noted  . Pancreatitis 06/08/2018  . Chest pain 07/21/2016  . Cholelithiasis 11/28/2013  . Tobacco abuse 11/28/2013  .  Abdominal pain 11/27/2013  . Acute pancreatitis 11/20/2013  . Constipation 11/20/2013  . HTN (hypertension), benign 11/20/2013  . PTSD (post-traumatic stress disorder) 11/20/2013  . Sciatica 05/24/2009  . DERANGEMENT MENISCUS 10/24/2008  . JOINT EFFUSION, KNEE 10/24/2008  . KNEE PAIN 10/24/2008    1:55 PM, 12/21/20 Mearl Latin PT, DPT Physical Therapist at [place], [place], [address] Phone: [phone]   Fax:  [phone]  Name: Clinton Ross MRN: [id number] Date of Birth: 1965-12-14

## 2020-12-26 ENCOUNTER — Encounter (HOSPITAL_COMMUNITY): Payer: Medicare HMO | Admitting: Physical Therapy

## 2020-12-28 ENCOUNTER — Encounter (HOSPITAL_COMMUNITY): Payer: Medicare HMO | Admitting: Physical Therapy

## 2021-01-02 ENCOUNTER — Ambulatory Visit (HOSPITAL_COMMUNITY): Payer: Medicare HMO | Admitting: Physical Therapy

## 2021-01-04 ENCOUNTER — Ambulatory Visit (HOSPITAL_COMMUNITY): Payer: Medicare HMO | Attending: Neurosurgery | Admitting: Physical Therapy

## 2021-01-04 ENCOUNTER — Other Ambulatory Visit: Payer: Self-pay

## 2021-01-04 ENCOUNTER — Encounter (HOSPITAL_COMMUNITY): Payer: Self-pay | Admitting: Physical Therapy

## 2021-01-04 DIAGNOSIS — M542 Cervicalgia: Secondary | ICD-10-CM | POA: Insufficient documentation

## 2021-01-04 DIAGNOSIS — M25511 Pain in right shoulder: Secondary | ICD-10-CM | POA: Diagnosis not present

## 2021-01-04 DIAGNOSIS — R29898 Other symptoms and signs involving the musculoskeletal system: Secondary | ICD-10-CM | POA: Insufficient documentation

## 2021-01-04 DIAGNOSIS — M6281 Muscle weakness (generalized): Secondary | ICD-10-CM | POA: Insufficient documentation

## 2021-01-04 NOTE — Therapy (Signed)
[place], [place], [address] Phone: [phone]   Fax:  [phone]  Physical Therapy Treatment  Patient Details  Name: Clinton Ross MRN: [id number] Date of Birth: Oct 21, 1965 Referring Provider (PT): Earnie Larsson MD   Encounter Date: 01/04/2021   PT End of Session - 01/04/21 1443    Visit Number 7    Number of Visits 12    Date for PT Re-Evaluation 01/16/21    Authorization Type Primary: [facility] (15 visits approved)    Authorization - Visit Number 7    Authorization - Number of Visits 15    Progress Note Due on Visit 10    PT Start Time 7782    PT Stop Time 1523    PT Time Calculation (min) 40 min    Activity Tolerance Patient tolerated treatment well;No increased pain    Behavior During Therapy WFL for tasks assessed/performed           Past Medical History:  Diagnosis Date  . Acute pancreatitis 10/2013  . Arthritis   . Bulging disc   . Cancer [facility])    Colon  . Chronic knee pain   . Colostomy in place [facility])   . Gallstones   . Hypertension   . PTSD (post-traumatic stress disorder)   . Sciatica     Past Surgical History:  Procedure Laterality Date  . CHOLECYSTECTOMY    . COLON SURGERY    . rectal cancer Right     There were no vitals filed for this visit.   Subjective Assessment - 01/04/21 1444    Subjective Patient states neck has been feeling better. Shoulder is very sore but might be from weed eating yesterday.    Pertinent History PTSD, LBP, hx colon cancer    Limitations Lifting;House hold activities;Reading    Patient Stated Goals improve pain and function    Currently in Pain? Yes    Pain Score 3     Pain Location Neck    Pain Orientation Lower    Pain Descriptors / Indicators Aching    Pain Type Chronic pain    Pain Onset More than a month ago    Pain Frequency Constant                             [facility] Adult PT Treatment/Exercise - 01/04/21 0001      Neck  Exercises: Standing   Other Standing Exercises shoulder isometrics 5 x 10 second holds flexion, ext, abd, ER, IR    Other Standing Exercises shoulder stabs with ball up/down, side/side/ CW/CCW 10x each direction                  PT Education - 01/04/21 1443    Education Details HEP, exercise mechanics    Person(s) Educated Patient    Methods Explanation;Demonstration    Comprehension Verbalized understanding;Returned demonstration            PT Short Term Goals - 12/05/20 1225      PT SHORT TERM GOAL #1   Title Patient will be independent with HEP in order to improve functional outcomes.    Time 3    Period Weeks    Status New    Target Date 12/26/20      PT SHORT TERM GOAL #2   Title Patient will report at least 25% improvement in symptoms for improved quality of life.  Time 3    Period Weeks    Status New    Target Date 12/26/20             PT Long Term Goals - 12/05/20 1226      PT LONG TERM GOAL #1   Title Patient will report at least 75% improvement in symptoms for improved quality of life.    Time 6    Period Weeks    Status New    Target Date 01/16/21      PT LONG TERM GOAL #2   Title Patient will improve FOTO score by at least 10 points in order to indicate improved tolerance to activity.    Time 6    Period Weeks    Status New    Target Date 01/16/21      PT LONG TERM GOAL #3   Title Patient will demonstrate at least 4+/5 strength in RUE for improved ability to complete overhead tasks at home.    Time 6    Period Weeks    Status New    Target Date 01/16/21                 Plan - 01/04/21 1443    Clinical Impression Statement Patient with improving cervical spine symptoms. He was not able to perform all of HEP on vacation as he forgot resistance bands. Added shoulder isometrics today for improved shoulder stabilizer and rotator cuff activation. Patient requires frequent cueing for decreased intensity for exercise with c/o  increasing symptoms. Patient will continue to benefit from skilled physical therapy in order to reduce impairment and improve function.    Personal Factors and Comorbidities Behavior Pattern;Comorbidity 2;Past/Current Experience    Comorbidities PTSD, HTN    Examination-Activity Limitations Carry;Lift;Reach Overhead    Examination-Participation Restrictions Cleaning;Occupation;Community Activity;Volunteer;Yard Work;Shop    Stability/Clinical Decision Making Stable/Uncomplicated    Rehab Potential Good    PT Frequency 2x / week    PT Duration 6 weeks    PT Treatment/Interventions ADLs/Self Care Home Management;Aquatic Therapy;Cryotherapy;Electrical Stimulation;Iontophoresis 4mg /ml Dexamethasone;Moist Heat;Traction;Ultrasound;DME Instruction;Functional mobility training;Stair training;Gait training;Therapeutic exercise;Balance training;Therapeutic activities;Patient/family education;Neuromuscular re-education;Manual techniques;Compression bandaging;Dry needling;Energy conservation;Splinting;Taping;Spinal Manipulations;Joint Manipulations    PT Next Visit Plan continue with manual and postural strengthening, possibly begin RC iso and shoulder strength.    PT Home Exercise Plan cervical retraction, row, extension with band;' cervical sidebend; isometric for sidebend and cervical extension, decompression with band 4/14 shoulder iso 5 way    Consulted and Agree with Plan of Care Patient           Patient will benefit from skilled therapeutic intervention in order to improve the following deficits and impairments:  Impaired UE functional use,Decreased activity tolerance,Pain,Improper body mechanics,Decreased mobility,Decreased strength  Visit Diagnosis: Cervicalgia  Right shoulder pain, unspecified chronicity  Other symptoms and signs involving the musculoskeletal system  Muscle weakness (generalized)     Problem List Patient Active Problem List   Diagnosis Date Noted  . Pancreatitis  06/08/2018  . Chest pain 07/21/2016  . Cholelithiasis 11/28/2013  . Tobacco abuse 11/28/2013  . Abdominal pain 11/27/2013  . Acute pancreatitis 11/20/2013  . Constipation 11/20/2013  . HTN (hypertension), benign 11/20/2013  . PTSD (post-traumatic stress disorder) 11/20/2013  . Sciatica 05/24/2009  . DERANGEMENT MENISCUS 10/24/2008  . JOINT EFFUSION, KNEE 10/24/2008  . KNEE PAIN 10/24/2008    3:26 PM, 01/04/21 Mearl Latin PT, DPT Physical Therapist at [facility] [phone]  [place], [place], [address] Phone: [phone]   Fax:  [phone]  Name: Clinton Ross MRN: [id number] Date of Birth: 04-30-1966

## 2021-01-04 NOTE — Patient Instructions (Signed)
Access Code: [email or web address] URL: [email or web address] Date: 01/04/2021 Prepared by: [facility] Baillie  Exercises Standing Isometric Shoulder Flexion with Doorway - Arm Bent - 1 x daily - 7 x weekly - 5 reps - 10 second hold Standing Isometric Shoulder Abduction with Doorway - Arm Bent - 1 x daily - 7 x weekly - 5 reps - 10 second hold Standing Isometric Shoulder Extension with Doorway - Arm Bent - 1 x daily - 7 x weekly - 5 reps - 10 second hold Standing Isometric Shoulder Internal Rotation with Towel Roll at Doorway - 1 x daily - 7 x weekly - 5 reps - 10 seconds hold Standing Isometric Shoulder External Rotation with Doorway and Towel Roll - 1 x daily - 7 x weekly - 5 reps - 10 second hold

## 2021-01-09 ENCOUNTER — Telehealth (HOSPITAL_COMMUNITY): Payer: Self-pay | Admitting: Physical Therapy

## 2021-01-09 ENCOUNTER — Ambulatory Visit (HOSPITAL_COMMUNITY): Payer: Medicare HMO | Admitting: Physical Therapy

## 2021-01-09 NOTE — Telephone Encounter (Signed)
He is having allergies today and will not be here - he will be here Thursday

## 2021-01-11 ENCOUNTER — Telehealth (HOSPITAL_COMMUNITY): Payer: Self-pay | Admitting: Physical Therapy

## 2021-01-11 ENCOUNTER — Ambulatory Visit (HOSPITAL_COMMUNITY): Payer: Medicare HMO | Admitting: Physical Therapy

## 2021-01-11 NOTE — Telephone Encounter (Signed)
He has another apptment in [place] and will not be here today

## 2021-01-16 ENCOUNTER — Other Ambulatory Visit: Payer: Self-pay

## 2021-01-16 ENCOUNTER — Ambulatory Visit (HOSPITAL_COMMUNITY): Payer: Medicare HMO

## 2021-01-16 ENCOUNTER — Encounter (HOSPITAL_COMMUNITY): Payer: Self-pay

## 2021-01-18 ENCOUNTER — Ambulatory Visit (HOSPITAL_COMMUNITY): Payer: Medicare HMO

## 2021-01-23 ENCOUNTER — Telehealth (HOSPITAL_COMMUNITY): Payer: Self-pay | Admitting: Physical Therapy

## 2021-01-23 ENCOUNTER — Ambulatory Visit (HOSPITAL_COMMUNITY): Payer: No Typology Code available for payment source | Admitting: Physical Therapy

## 2021-01-23 NOTE — Telephone Encounter (Signed)
Pt called he has to work and will not be here -r/s for next week

## 2021-01-31 ENCOUNTER — Ambulatory Visit (HOSPITAL_COMMUNITY): Payer: No Typology Code available for payment source | Admitting: Physical Therapy

## 2021-05-28 ENCOUNTER — Encounter: Payer: Self-pay | Admitting: Emergency Medicine

## 2021-05-28 ENCOUNTER — Ambulatory Visit
Admission: EM | Admit: 2021-05-28 | Discharge: 2021-05-28 | Disposition: A | Discharge status: Home / Self Care | Payer: No Typology Code available for payment source | Attending: Family Medicine | Admitting: Family Medicine

## 2021-05-28 DIAGNOSIS — M5442 Lumbago with sciatica, left side: Secondary | ICD-10-CM

## 2021-05-28 MED ORDER — KETOROLAC TROMETHAMINE 60 MG/2ML IM SOLN
60.0000 mg | Freq: Once | INTRAMUSCULAR | Status: AC
  Administered 2021-05-28: 60 mg via INTRAMUSCULAR

## 2021-05-28 MED ORDER — PREDNISONE 20 MG PO TABS: 40.0000 mg | ORAL_TABLET | Freq: Every day | ORAL | 0 refills | Status: DC

## 2021-05-28 NOTE — ED Triage Notes (Signed)
States he "stepped down the steps wrong this morning" and now is having back pain.  States last time he had a back flare up he was given a shot of Toradol that helped the pain

## 2021-05-28 NOTE — ED Provider Notes (Signed)
[facility]    CSN: [id number] Arrival date & time: 05/28/21  1130      History   Chief Complaint No chief complaint on file.   HPI Clinton Ross is a 55 y.o. male.   HPI Patient presents today with acute on chronic low back pain after mis-stepping earlier today and twisting his lower back.  He endorses most of his pain is localized to the left lower back radiating down the left thigh.  He did not fall. Denies any recent problems with his back.  He was seen here over a year ago and treated with a Toradol shot and reports from that injection he did do much better.  Denies any associated neurological impairment associated with back pain. Past Medical History:  Diagnosis Date   Acute pancreatitis 10/2013   Arthritis    Bulging disc    Cancer ([place])    Colon   Chronic knee pain    Colostomy in place [facility])    Gallstones    Hypertension    PTSD (post-traumatic stress disorder)    Sciatica     Patient Active Problem List   Diagnosis Date Noted   Pancreatitis 06/08/2018   Chest pain 07/21/2016   Cholelithiasis 11/28/2013   Tobacco abuse 11/28/2013   Abdominal pain 11/27/2013   Acute pancreatitis 11/20/2013   Constipation 11/20/2013   HTN (hypertension), benign 11/20/2013   PTSD (post-traumatic stress disorder) 11/20/2013   Sciatica 05/24/2009   DERANGEMENT MENISCUS 10/24/2008   JOINT EFFUSION, KNEE 10/24/2008   KNEE PAIN 10/24/2008    Past Surgical History:  Procedure Laterality Date   CHOLECYSTECTOMY     COLON SURGERY     rectal cancer Right        Home Medications    Prior to Admission medications   Medication Sig Start Date End Date Taking? Authorizing Provider  predniSONE (DELTASONE) 20 MG tablet Take 2 tablets (40 mg total) by mouth daily with breakfast. 05/28/21  Yes Scot Jun, FNP  amLODipine (NORVASC) 5 MG tablet Take 1 tablet (5 mg total) by mouth daily. Patient taking differently: Take 10 mg by mouth daily.  06/11/18   Kathie Dike, MD  aspirin 81 MG chewable tablet Chew 1 tablet (81 mg total) by mouth daily. Patient taking differently: Chew 81 mg by mouth 3 (three) times a week.  02/23/18   Julianne Rice, MD  cyclobenzaprine (FLEXERIL) 10 MG tablet Take 1 tablet (10 mg total) by mouth at bedtime. 03/23/20   Wurst, [place], PA-C  doxycycline (VIBRAMYCIN) 100 MG capsule Take 1 capsule (100 mg total) by mouth 2 (two) times daily. 03/23/20   Wurst, [place], PA-C  ibuprofen (ADVIL) 800 MG tablet Take 1 tablet (800 mg total) by mouth 3 (three) times daily. 10/14/20   Triplett, Tammy, PA-C  nitroGLYCERIN (NITROSTAT) 0.4 MG SL tablet Place 1 tablet (0.4 mg total) under the tongue every 5 (five) minutes as needed for chest pain. 02/23/18   Julianne Rice, MD  oxyCODONE-acetaminophen (PERCOCET/ROXICET) 5-325 MG tablet Take 1 tablet by mouth every 4 (four) hours as needed. 10/14/20   Kem Parkinson, PA-C    Family History Family History  Problem Relation Age of Onset   Hypertension Mother    Stroke Father    Diabetes Other     Social History Social History   Tobacco Use   Smoking status: Every Day    Packs/day: 0.50    Years: 30.00    Pack years: 15.00    Types: Cigarettes  Smokeless tobacco: Never  Substance Use Topics   Alcohol use: No   Drug use: No     Allergies   Peanut-containing drug products and Morphine and related   Review of Systems Review of Systems Pertinent negatives listed in HPI   Physical Exam Triage Vital Signs ED Triage Vitals  Enc Vitals Group     BP 05/28/21 1203 (!) 143/84     Pulse Rate 05/28/21 1203 82     Resp 05/28/21 1203 16     Temp 05/28/21 1203 (!) 97.4 F (36.3 C)     Temp Source 05/28/21 1203 Oral     SpO2 05/28/21 1203 97 %     Weight --      Height --      Head Circumference --      Peak Flow --      Pain Score 05/28/21 1204 8     Pain Loc --      Pain Edu? --      Excl. in [place]? --    No data found.  Updated Vital Signs BP (!) 143/84 (BP Location:  Right Arm)   Pulse 82   Temp (!) 97.4 F (36.3 C) (Oral)   Resp 16   SpO2 97%   Visual Acuity Right Eye Distance:   Left Eye Distance:   Bilateral Distance:    Right Eye Near:   Left Eye Near:    Bilateral Near:     Physical Exam Constitutional:      Appearance: Normal appearance.  HENT:     Head: Normocephalic.  Cardiovascular:     Rate and Rhythm: Normal rate and regular rhythm.  Pulmonary:     Effort: Pulmonary effort is normal.     Breath sounds: Normal breath sounds.  Musculoskeletal:        General: No swelling or tenderness.       Arms:     Cervical back: No rigidity.  Lymphadenopathy:     Cervical: No cervical adenopathy.  Skin:    Capillary Refill: Capillary refill takes less than 2 seconds.  Neurological:     General: No focal deficit present.     Mental Status: He is alert.  Psychiatric:        Mood and Affect: Mood normal.        Behavior: Behavior normal.        Thought Content: Thought content normal.        Judgment: Judgment normal.  Acute on chronic low back pain   UC Treatments / Results  Labs (all labs ordered are listed, but only abnormal results are displayed) Labs Reviewed - No data to display  EKG   Radiology No results found.  Procedures Procedures (including critical care time)  Medications Ordered in UC Medications  ketorolac (TORADOL) injection 60 mg (has no administration in time range)    Initial Impression / Assessment and Plan / UC Course  I have reviewed the triage vital signs and the nursing notes.  Pertinent labs & imaging results that were available during my care of the patient were reviewed by me and considered in my medical decision making (see chart for details).     Acute on chronic low back pain Treatment today with a Toradol 60 mg IM injection here in clinic. Patient advised if pain persist to start prednisone 40 mg once daily for total of 5 days.  Patient was also given information to follow-up at  [facility] orthopedic urgent care clinic for evaluation  of a chronic knee problem in which she is attempting to get a cortisone injection in the knee. Final Clinical Impressions(s) / UC Diagnoses   Final diagnoses:  Acute bilateral low back pain with left-sided sciatica   Discharge Instructions   None    ED Prescriptions     Medication Sig Dispense Auth. Provider   predniSONE (DELTASONE) 20 MG tablet Take 2 tablets (40 mg total) by mouth daily with breakfast. 10 tablet Scot Jun, FNP      PDMP not reviewed this encounter.   Scot Jun, FNP 05/28/21 1314

## 2021-06-18 ENCOUNTER — Encounter (HOSPITAL_COMMUNITY): Payer: Self-pay | Admitting: Physical Therapy

## 2021-06-18 NOTE — Therapy (Signed)
[place], [place], [address] Phone: [phone]   Fax:  [phone]  Patient Details  Name: Clinton Ross MRN: [id number] Date of Birth: 1966-09-08 Referring Provider:  No ref. provider found  Encounter Date: 06/18/2021   PHYSICAL THERAPY DISCHARGE SUMMARY  Visits from Start of Care: 7  Current functional level related to goals / functional outcomes: Unknown as patient has not returned.   Remaining deficits: Unknown as patient has not returned.   Education / Equipment: Unknown as patient has not returned.    Patient agrees to discharge. Patient goals were not met. Patient is being discharged due to not returning since the last visit.   9:34 AM, 06/18/21 Mearl Latin PT, DPT Physical Therapist at [place], [place], [address] Phone: [phone]   Fax:  [phone]

## 2021-08-03 ENCOUNTER — Encounter (HOSPITAL_COMMUNITY): Payer: Self-pay | Admitting: *Deleted

## 2021-08-03 ENCOUNTER — Emergency Department (HOSPITAL_COMMUNITY)
Admission: EM | Admit: 2021-08-03 | Discharge: 2021-08-03 | Disposition: A | Discharge status: Home / Self Care | Payer: No Typology Code available for payment source | Attending: Emergency Medicine | Admitting: Emergency Medicine

## 2021-08-03 DIAGNOSIS — Z85038 Personal history of other malignant neoplasm of large intestine: Secondary | ICD-10-CM | POA: Diagnosis not present

## 2021-08-03 DIAGNOSIS — I10 Essential (primary) hypertension: Secondary | ICD-10-CM | POA: Diagnosis not present

## 2021-08-03 DIAGNOSIS — Z79899 Other long term (current) drug therapy: Secondary | ICD-10-CM | POA: Diagnosis not present

## 2021-08-03 DIAGNOSIS — Z91010 Allergy to peanuts: Secondary | ICD-10-CM | POA: Diagnosis not present

## 2021-08-03 DIAGNOSIS — Z7982 Long term (current) use of aspirin: Secondary | ICD-10-CM | POA: Diagnosis not present

## 2021-08-03 DIAGNOSIS — M545 Low back pain, unspecified: Secondary | ICD-10-CM | POA: Diagnosis present

## 2021-08-03 DIAGNOSIS — F17210 Nicotine dependence, cigarettes, uncomplicated: Secondary | ICD-10-CM | POA: Insufficient documentation

## 2021-08-03 DIAGNOSIS — M5442 Lumbago with sciatica, left side: Secondary | ICD-10-CM | POA: Diagnosis not present

## 2021-08-03 MED ORDER — KETOROLAC TROMETHAMINE 30 MG/ML IJ SOLN
30.0000 mg | Freq: Once | INTRAMUSCULAR | Status: AC
  Administered 2021-08-03: 30 mg via INTRAMUSCULAR
  Filled 2021-08-03: qty 1

## 2021-08-03 NOTE — Discharge Instructions (Signed)
You have been seen here for back pain. I recommend taking over-the-counter pain medications like ibuprofen and/or Tylenol every 6 as needed.  Please follow dosage and on the back of bottle.  I also recommend applying heat to the area and stretching out the muscles as this will help decrease stiffness and pain.  I have given you information on exercises please follow.  Please follow-up with PCP for further evaluation.  Come back to the emergency department if you develop chest pain, shortness of breath, severe abdominal pain, uncontrolled nausea, vomiting, diarrhea.

## 2021-08-03 NOTE — ED Provider Notes (Signed)
[facility] EMERGENCY DEPARTMENT Provider Note   CSN: [id number] Arrival date & time: 08/03/21  1640     History Chief Complaint  Patient presents with   Back Pain    Clinton Ross is a 55 y.o. male.  HPI  Patient with significant medical history including arthritis, chronic back pain bulging disc presents with chief complaint of lower back pain.  Patient states pain started yesterday, started after he was moving some brush in the yard.  He states he has pain on his left lower back, will radiate down his left leg, no paresthesias or weakness to lower extremities, denies saddle paresthesias, urinary incontinency, retention, difficult bowel movements, states that pain is worsened with movement improved with rest.  States he has been taking ibuprofen and his hydrocodone which has been helping with his pain.  He has no associated fevers, chills, chest pain, shortness of breath, peripheral edema, no history of IV drug use, he has no other complaints at this time.  Past Medical History:  Diagnosis Date   Acute pancreatitis 10/2013   Arthritis    Bulging disc    Cancer ([place])    Colon   Chronic knee pain    Colostomy in place [facility])    Gallstones    Hypertension    PTSD (post-traumatic stress disorder)    Sciatica     Patient Active Problem List   Diagnosis Date Noted   Pancreatitis 06/08/2018   Chest pain 07/21/2016   Cholelithiasis 11/28/2013   Tobacco abuse 11/28/2013   Abdominal pain 11/27/2013   Acute pancreatitis 11/20/2013   Constipation 11/20/2013   HTN (hypertension), benign 11/20/2013   PTSD (post-traumatic stress disorder) 11/20/2013   Sciatica 05/24/2009   DERANGEMENT MENISCUS 10/24/2008   JOINT EFFUSION, KNEE 10/24/2008   KNEE PAIN 10/24/2008    Past Surgical History:  Procedure Laterality Date   CHOLECYSTECTOMY     COLON SURGERY     rectal cancer Right        Family History  Problem Relation Age of Onset   Hypertension Mother    Stroke Father     Diabetes Other     Social History   Tobacco Use   Smoking status: Every Day    Packs/day: 0.50    Years: 30.00    Pack years: 15.00    Types: Cigarettes   Smokeless tobacco: Never  Substance Use Topics   Alcohol use: No   Drug use: No    Home Medications Prior to Admission medications   Medication Sig Start Date End Date Taking? Authorizing Provider  amLODipine (NORVASC) 5 MG tablet Take 1 tablet (5 mg total) by mouth daily. Patient taking differently: Take 10 mg by mouth daily.  06/11/18   Kathie Dike, MD  aspirin 81 MG chewable tablet Chew 1 tablet (81 mg total) by mouth daily. Patient taking differently: Chew 81 mg by mouth 3 (three) times a week.  02/23/18   Julianne Rice, MD  cyclobenzaprine (FLEXERIL) 10 MG tablet Take 1 tablet (10 mg total) by mouth at bedtime. 03/23/20   Wurst, [place], PA-C  doxycycline (VIBRAMYCIN) 100 MG capsule Take 1 capsule (100 mg total) by mouth 2 (two) times daily. 03/23/20   Wurst, [place], PA-C  ibuprofen (ADVIL) 800 MG tablet Take 1 tablet (800 mg total) by mouth 3 (three) times daily. 10/14/20   Triplett, Tammy, PA-C  nitroGLYCERIN (NITROSTAT) 0.4 MG SL tablet Place 1 tablet (0.4 mg total) under the tongue every 5 (five) minutes as needed for chest  pain. 02/23/18   Julianne Rice, MD  oxyCODONE-acetaminophen (PERCOCET/ROXICET) 5-325 MG tablet Take 1 tablet by mouth every 4 (four) hours as needed. 10/14/20   Triplett, Tammy, PA-C  predniSONE (DELTASONE) 20 MG tablet Take 2 tablets (40 mg total) by mouth daily with breakfast. 05/28/21   Scot Jun, FNP    Allergies    Peanut-containing drug products and Morphine and related  Review of Systems   Review of Systems  Constitutional:  Negative for chills and fever.  HENT:  Negative for congestion.   Respiratory:  Negative for shortness of breath.   Cardiovascular:  Negative for chest pain.  Gastrointestinal:  Negative for abdominal pain.  Genitourinary:  Negative for enuresis.   Musculoskeletal:  Positive for back pain.  Skin:  Negative for rash.  Neurological:  Negative for dizziness.  Hematological:  Does not bruise/bleed easily.   Physical Exam Updated Vital Signs BP (!) 139/93 (BP Location: Right Arm)   Pulse 80   Temp 98.1 F (36.7 C) (Oral)   Resp 15   Ht 5\' 10"  (1.778 m)   Wt 79.4 kg   SpO2 97%   BMI 25.11 kg/m   Physical Exam Vitals and nursing note reviewed.  Constitutional:      General: He is not in acute distress.    Appearance: He is not ill-appearing.  HENT:     Head: Normocephalic and atraumatic.     Nose: No congestion.  Eyes:     Conjunctiva/sclera: Conjunctivae normal.  Cardiovascular:     Rate and Rhythm: Normal rate and regular rhythm.     Pulses: Normal pulses.     Heart sounds: No murmur heard.   No friction rub. No gallop.  Pulmonary:     Effort: No respiratory distress.     Breath sounds: No wheezing, rhonchi or rales.  Musculoskeletal:     Comments: No noted skin changes seen on patient's spine, spine was palpated was nontender to palpation, he has no tenderness within the musculature on the left iliac crest, he has full range of motion, 5 of 5 strength neurovascular tact in lower extremities.  Skin:    General: Skin is warm and dry.  Neurological:     Mental Status: He is alert.  Psychiatric:        Mood and Affect: Mood normal.    ED Results / Procedures / Treatments   Labs (all labs ordered are listed, but only abnormal results are displayed) Labs Reviewed - No data to display  EKG None  Radiology No results found.  Procedures Procedures   Medications Ordered in ED Medications  ketorolac (TORADOL) 30 MG/ML injection 30 mg (30 mg Intramuscular Given 08/03/21 1830)    ED Course  I have reviewed the triage vital signs and the nursing notes.  Pertinent labs & imaging results that were available during my care of the patient were reviewed by me and considered in my medical decision making (see chart  for details).    MDM Rules/Calculators/A&P                          Initial impression-presents with back pain.  He is alert, does not appear to be in acute distress, vital signs are reassuring.  Old lab work was reviewed no history of CKD we will provide him with a shot of Toradol and discharge.  Work-up-due to well-appearing patient, benign physical exam, further lab or imaging are not warranted at this time.  Rule out- I have low suspicion for spinal fracture or spinal cord abnormality as patient denies urinary incontinency, retention, difficulty with bowel movements, denies saddle paresthesias.  Spine was palpated there is no step-off, crepitus or gross deformities felt, patient had 5/5 strength, full range of motion, neurovascular fully intact in the lower extremities.  Will defer imaging as there is no associated trauma with this injury.  Low suspicion for septic arthritis as patient denies IV drug use, skin exam was performed no erythematous, edema or warm joints noted.  Low suspicion for UTI, [place], kidney stone as presentation atypical etiology.  Low suspicion dissection as presentation atypical etiology.   Plan-  Back pain-likely acute on chronic pain, will provide him with Toradol, have him continue with over-the-counter pain medications, follow-up with PCP for further evaluation.  Vital signs have remained stable, no indication for hospital admission.   Patient given at home care as well strict return precautions.  Patient verbalized that they understood agreed to said plan.  Final Clinical Impression(s) / ED Diagnoses Final diagnoses:  Acute left-sided low back pain with left-sided sciatica    Rx / DC Orders ED Discharge Orders     None        Marcello Fennel, PA-C 08/03/21 1853    Daleen Bo, MD 08/04/21 1227

## 2021-08-03 NOTE — ED Provider Notes (Signed)
Emergency Medicine Provider Triage Evaluation Note  Clinton Ross , a 55 y.o. male  was evaluated in triage.  Pt complains of recurrent left low back pain radiating to left leg.  Pain started today spontaneously.  He has been using ice on it without relief.  Previously, when he has had this problem he got relief with Toradol injection.  He states he has an old injury, "ruptured disc," when he fell out of a truck while in the [employer].  He gets care at the [facility].  Review of Systems  Positive: Low back and left leg pain Negative: No trouble walking, bowel or bladder incontinence  Physical Exam  BP (!) 139/93 (BP Location: Right Arm)   Pulse 80   Temp 98.1 F (36.7 C) (Oral)   Resp 15   Ht 5\' 10"  (1.778 m)   Wt 79.4 kg   SpO2 97%   BMI 25.11 kg/m  Gen:   Awake, uncomfortable while seated in chair Resp:  Normal effort  MSK:   Moves extremities without difficulty guards against moving the left leg secondary to pain in the low back Other:  Nontoxic appearance  Medical Decision Making  Medically screening exam initiated at 5:49 PM.  Appropriate orders placed.  Kara Pacer was informed that the remainder of the evaluation will be completed by another provider, this initial triage assessment does not replace that evaluation, and the importance of remaining in the ED until their evaluation is complete.  Toradol ordered.  Anticipate discharge after he starts feeling better.   Daleen Bo, MD 08/03/21 1750

## 2021-08-03 NOTE — ED Triage Notes (Signed)
Low back pain onset this am

## 2021-09-23 DIAGNOSIS — I639 Cerebral infarction, unspecified: Secondary | ICD-10-CM

## 2021-09-23 HISTORY — DX: Cerebral infarction, unspecified: I63.9

## 2021-10-05 ENCOUNTER — Encounter: Payer: Self-pay | Admitting: Emergency Medicine

## 2021-10-05 ENCOUNTER — Ambulatory Visit
Admission: EM | Admit: 2021-10-05 | Discharge: 2021-10-05 | Disposition: A | Discharge status: Home / Self Care | Payer: No Typology Code available for payment source | Attending: Family Medicine | Admitting: Family Medicine

## 2021-10-05 ENCOUNTER — Other Ambulatory Visit: Payer: Self-pay

## 2021-10-05 DIAGNOSIS — J069 Acute upper respiratory infection, unspecified: Secondary | ICD-10-CM

## 2021-10-05 DIAGNOSIS — H65191 Other acute nonsuppurative otitis media, right ear: Secondary | ICD-10-CM

## 2021-10-05 DIAGNOSIS — H9201 Otalgia, right ear: Secondary | ICD-10-CM

## 2021-10-05 DIAGNOSIS — Z9189 Other specified personal risk factors, not elsewhere classified: Secondary | ICD-10-CM | POA: Diagnosis not present

## 2021-10-05 MED ORDER — FLUTICASONE PROPIONATE 50 MCG/ACT NA SUSP: 1.0000 | Freq: Two times a day (BID) | NASAL | 0 refills | Status: AC

## 2021-10-05 MED ORDER — PREDNISONE 50 MG PO TABS: ORAL_TABLET | ORAL | 0 refills | Status: DC

## 2021-10-05 NOTE — ED Triage Notes (Signed)
Fever, chills, cough, body aches, diarrhea since Tuesday.  Right ear pain since Thursday.

## 2021-10-05 NOTE — ED Provider Notes (Signed)
[facility]    CSN: [id number] Arrival date & time: 10/05/21  0900      History   Chief Complaint No chief complaint on file.   HPI Clinton Ross is a 56 y.o. male.   Presenting today with 3-day history of fever, chills, body aches, cough, diarrhea, now right ear pain and pressure.  Denies chest pain, shortness of breath, abdominal pain, nausea, vomiting and states that most symptoms are improving, mainly worried about the ear pain at this time.  Has been taking Coricidin HBP and aspirin with mild relief of symptoms.  Wife is also sick with similar symptoms.  No known pertinent chronic medical problems.   Past Medical History:  Diagnosis Date   Acute pancreatitis 10/2013   Arthritis    Bulging disc    Cancer ([place])    Colon   Chronic knee pain    Colostomy in place [facility])    Gallstones    Hypertension    PTSD (post-traumatic stress disorder)    Sciatica     Patient Active Problem List   Diagnosis Date Noted   Pancreatitis 06/08/2018   Chest pain 07/21/2016   Cholelithiasis 11/28/2013   Tobacco abuse 11/28/2013   Abdominal pain 11/27/2013   Acute pancreatitis 11/20/2013   Constipation 11/20/2013   HTN (hypertension), benign 11/20/2013   PTSD (post-traumatic stress disorder) 11/20/2013   Sciatica 05/24/2009   DERANGEMENT MENISCUS 10/24/2008   JOINT EFFUSION, KNEE 10/24/2008   KNEE PAIN 10/24/2008    Past Surgical History:  Procedure Laterality Date   CHOLECYSTECTOMY     COLON SURGERY     rectal cancer Right        Home Medications    Prior to Admission medications   Medication Sig Start Date End Date Taking? Authorizing Provider  fluticasone (FLONASE) 50 MCG/ACT nasal spray Place 1 spray into both nostrils 2 (two) times daily. 10/05/21  Yes Volney American, PA-C  predniSONE (DELTASONE) 50 MG tablet Take 1 tab daily with breakfast for 3 days 10/05/21  Yes Volney American, PA-C  amLODipine (NORVASC) 5 MG tablet Take 1 tablet (5 mg  total) by mouth daily. Patient taking differently: Take 10 mg by mouth daily.  06/11/18   Kathie Dike, MD  aspirin 81 MG chewable tablet Chew 1 tablet (81 mg total) by mouth daily. Patient taking differently: Chew 81 mg by mouth 3 (three) times a week.  02/23/18   Julianne Rice, MD  cyclobenzaprine (FLEXERIL) 10 MG tablet Take 1 tablet (10 mg total) by mouth at bedtime. 03/23/20   Wurst, [place], PA-C  doxycycline (VIBRAMYCIN) 100 MG capsule Take 1 capsule (100 mg total) by mouth 2 (two) times daily. 03/23/20   Wurst, [place], PA-C  ibuprofen (ADVIL) 800 MG tablet Take 1 tablet (800 mg total) by mouth 3 (three) times daily. 10/14/20   Triplett, Tammy, PA-C  nitroGLYCERIN (NITROSTAT) 0.4 MG SL tablet Place 1 tablet (0.4 mg total) under the tongue every 5 (five) minutes as needed for chest pain. 02/23/18   Julianne Rice, MD  oxyCODONE-acetaminophen (PERCOCET/ROXICET) 5-325 MG tablet Take 1 tablet by mouth every 4 (four) hours as needed. 10/14/20   Triplett, Tammy, PA-C  predniSONE (DELTASONE) 20 MG tablet Take 2 tablets (40 mg total) by mouth daily with breakfast. 05/28/21   Scot Jun, FNP    Family History Family History  Problem Relation Age of Onset   Hypertension Mother    Stroke Father    Diabetes Other  Social History Social History   Tobacco Use   Smoking status: Every Day    Packs/day: 0.50    Years: 30.00    Pack years: 15.00    Types: Cigarettes   Smokeless tobacco: Never  Substance Use Topics   Alcohol use: No   Drug use: No     Allergies   Peanut-containing drug products and Morphine and related   Review of Systems Review of Systems Per HPI  Physical Exam Triage Vital Signs ED Triage Vitals  Enc Vitals Group     BP 10/05/21 0938 100/70     Pulse Rate 10/05/21 0938 89     Resp 10/05/21 0938 18     Temp 10/05/21 0938 98 F (36.7 C)     Temp Source 10/05/21 0938 Oral     SpO2 10/05/21 0938 95 %     Weight --      Height --      Head  Circumference --      Peak Flow --      Pain Score 10/05/21 0939 5     Pain Loc --      Pain Edu? --      Excl. in [place]? --    No data found.  Updated Vital Signs BP 100/70 (BP Location: Right Arm)    Pulse 89    Temp 98 F (36.7 C) (Oral)    Resp 18    SpO2 95%   Visual Acuity Right Eye Distance:   Left Eye Distance:   Bilateral Distance:    Right Eye Near:   Left Eye Near:    Bilateral Near:     Physical Exam Vitals and nursing note reviewed.  Constitutional:      Appearance: He is well-developed.  HENT:     Head: Atraumatic.     Right Ear: External ear normal.     Left Ear: Tympanic membrane and external ear normal.     Ears:     Comments: Right middle ear effusion, mild injection of TM    Nose:     Comments: Nasal mucosa erythematous and edematous    Mouth/Throat:     Mouth: Mucous membranes are moist.     Pharynx: Posterior oropharyngeal erythema present. No oropharyngeal exudate.  Eyes:     Conjunctiva/sclera: Conjunctivae normal.     Pupils: Pupils are equal, round, and reactive to light.  Cardiovascular:     Rate and Rhythm: Normal rate and regular rhythm.     Heart sounds: Normal heart sounds.  Pulmonary:     Effort: Pulmonary effort is normal. No respiratory distress.     Breath sounds: No wheezing or rales.  Musculoskeletal:        General: Normal range of motion.     Cervical back: Normal range of motion and neck supple.  Lymphadenopathy:     Cervical: No cervical adenopathy.  Skin:    General: Skin is warm and dry.  Neurological:     Mental Status: He is alert and oriented to person, place, and time.     Motor: No weakness.     Gait: Gait normal.  Psychiatric:        Behavior: Behavior normal.   UC Treatments / Results  Labs (all labs ordered are listed, but only abnormal results are displayed) Labs Reviewed  COVID-19, FLU A+B NAA   EKG  Radiology No results found.  Procedures Procedures (including critical care time)  Medications  Ordered in UC Medications - No  data to display  Initial Impression / Assessment and Plan / UC Course  I have reviewed the triage vital signs and the nursing notes.  Pertinent labs & imaging results that were available during my care of the patient were reviewed by me and considered in my medical decision making (see chart for details).     Vital signs benign and reassuring today, suspect viral illness causing subsequent right middle ear effusion.  We will treat with short burst of prednisone, Flonase, continue Coricidin HBP.  Discussed other supportive home care and return precautions.  COVID and flu testing pending.  Return for acutely worsening symptoms at any time.  Final Clinical Impressions(s) / UC Diagnoses   Final diagnoses:  At increased risk of exposure to COVID-19 virus  Viral URI with cough  Right ear pain  Acute MEE (middle ear effusion), right   Discharge Instructions   None    ED Prescriptions     Medication Sig Dispense Auth. Provider   predniSONE (DELTASONE) 50 MG tablet Take 1 tab daily with breakfast for 3 days 3 tablet Volney American, PA-C   fluticasone [facility]) 50 MCG/ACT nasal spray Place 1 spray into both nostrils 2 (two) times daily. 16 g Volney American, [place]      PDMP not reviewed this encounter.   Volney American, [place] 10/05/21 1036

## 2021-10-06 LAB — COVID-19, FLU A+B NAA
Influenza A, NAA: DETECTED — AB
Influenza B, NAA: NOT DETECTED
SARS-CoV-2, NAA: NOT DETECTED

## 2021-10-11 ENCOUNTER — Encounter: Payer: Self-pay | Admitting: Emergency Medicine

## 2021-10-11 ENCOUNTER — Ambulatory Visit
Admission: EM | Admit: 2021-10-11 | Discharge: 2021-10-11 | Disposition: A | Discharge status: Home / Self Care | Payer: Medicare HMO | Attending: Family Medicine | Admitting: Family Medicine

## 2021-10-11 ENCOUNTER — Other Ambulatory Visit: Payer: Self-pay

## 2021-10-11 DIAGNOSIS — R1084 Generalized abdominal pain: Secondary | ICD-10-CM

## 2021-10-11 DIAGNOSIS — R197 Diarrhea, unspecified: Secondary | ICD-10-CM | POA: Diagnosis not present

## 2021-10-11 DIAGNOSIS — R109 Unspecified abdominal pain: Secondary | ICD-10-CM | POA: Diagnosis not present

## 2021-10-11 LAB — POCT URINALYSIS DIP (MANUAL ENTRY)
Glucose, UA: NEGATIVE mg/dL
Ketones, POC UA: NEGATIVE mg/dL
Leukocytes, UA: NEGATIVE
Nitrite, UA: NEGATIVE
Protein Ur, POC: 30 mg/dL — AB
Spec Grav, UA: >=1.03 — AB (ref 1.010–1.025)
Urobilinogen, UA: 1 E.U./dL
pH, UA: 6 (ref 5.0–8.0)

## 2021-10-11 NOTE — ED Provider Notes (Signed)
[facility]    CSN: [id number] Arrival date & time: 10/11/21  1555      History   Chief Complaint No chief complaint on file.   HPI Clinton Ross is a 56 y.o. male.   Presenting today with diarrhea for 4 to 5 days, bilateral mid back soreness extending around flanks, fatigue, some lingering congestion since having influenza over a week ago.  States overall he was feeling better until the diarrhea started back up.  Denies fever, chills, chest pain, shortness of breath, nausea, vomiting, significant abdominal pain, dysuria, hematuria, urinary frequency or hesitancy.  Does have a colostomy bag in place left lower quadrant.  Not trying anything over-the-counter for symptoms thus far.  He states most bothersome thing is it feels like he has been beat up on his back and both sides where his kidneys are.   Past Medical History:  Diagnosis Date   Acute pancreatitis 10/2013   Arthritis    Bulging disc    Cancer ([place])    Colon   Chronic knee pain    Colostomy in place [facility])    Gallstones    Hypertension    PTSD (post-traumatic stress disorder)    Sciatica     Patient Active Problem List   Diagnosis Date Noted   Pancreatitis 06/08/2018   Chest pain 07/21/2016   Cholelithiasis 11/28/2013   Tobacco abuse 11/28/2013   Abdominal pain 11/27/2013   Acute pancreatitis 11/20/2013   Constipation 11/20/2013   HTN (hypertension), benign 11/20/2013   PTSD (post-traumatic stress disorder) 11/20/2013   Sciatica 05/24/2009   DERANGEMENT MENISCUS 10/24/2008   JOINT EFFUSION, KNEE 10/24/2008   KNEE PAIN 10/24/2008    Past Surgical History:  Procedure Laterality Date   CHOLECYSTECTOMY     COLON SURGERY     rectal cancer Right        Home Medications    Prior to Admission medications   Medication Sig Start Date End Date Taking? Authorizing Provider  amLODipine (NORVASC) 5 MG tablet Take 1 tablet (5 mg total) by mouth daily. Patient taking differently: Take 10 mg by  mouth daily.  06/11/18   Kathie Dike, MD  aspirin 81 MG chewable tablet Chew 1 tablet (81 mg total) by mouth daily. Patient taking differently: Chew 81 mg by mouth 3 (three) times a week.  02/23/18   Julianne Rice, MD  cyclobenzaprine (FLEXERIL) 10 MG tablet Take 1 tablet (10 mg total) by mouth at bedtime. 03/23/20   Wurst, [place], PA-C  doxycycline (VIBRAMYCIN) 100 MG capsule Take 1 capsule (100 mg total) by mouth 2 (two) times daily. 03/23/20   Wurst, [place], PA-C  fluticasone (FLONASE) 50 MCG/ACT nasal spray Place 1 spray into both nostrils 2 (two) times daily. 10/05/21   Volney American, PA-C  ibuprofen (ADVIL) 800 MG tablet Take 1 tablet (800 mg total) by mouth 3 (three) times daily. 10/14/20   Triplett, Tammy, PA-C  nitroGLYCERIN (NITROSTAT) 0.4 MG SL tablet Place 1 tablet (0.4 mg total) under the tongue every 5 (five) minutes as needed for chest pain. 02/23/18   Julianne Rice, MD  oxyCODONE-acetaminophen (PERCOCET/ROXICET) 5-325 MG tablet Take 1 tablet by mouth every 4 (four) hours as needed. 10/14/20   Triplett, Tammy, PA-C  predniSONE (DELTASONE) 20 MG tablet Take 2 tablets (40 mg total) by mouth daily with breakfast. 05/28/21   Scot Jun, FNP  predniSONE (DELTASONE) 50 MG tablet Take 1 tab daily with breakfast for 3 days 10/05/21   Volney American,  PA-C    Family History Family History  Problem Relation Age of Onset   Hypertension Mother    Stroke Father    Diabetes Other     Social History Social History   Tobacco Use   Smoking status: Every Day    Packs/day: 0.50    Years: 30.00    Pack years: 15.00    Types: Cigarettes   Smokeless tobacco: Never  Substance Use Topics   Alcohol use: No   Drug use: No     Allergies   Peanut-containing drug products and Morphine and related   Review of Systems Review of Systems Per HPI  Physical Exam Triage Vital Signs ED Triage Vitals  Enc Vitals Group     BP 10/11/21 1602 136/79     Pulse Rate  10/11/21 1602 78     Resp 10/11/21 1602 18     Temp 10/11/21 1602 99 F (37.2 C)     Temp Source 10/11/21 1602 Oral     SpO2 10/11/21 1602 97 %     Weight --      Height --      Head Circumference --      Peak Flow --      Pain Score 10/11/21 1604 6     Pain Loc --      Pain Edu? --      Excl. in [place]? --    No data found.  Updated Vital Signs BP 136/79 (BP Location: Right Arm)    Pulse 78    Temp 99 F (37.2 C) (Oral)    Resp 18    SpO2 97%   Visual Acuity Right Eye Distance:   Left Eye Distance:   Bilateral Distance:    Right Eye Near:   Left Eye Near:    Bilateral Near:     Physical Exam Vitals and nursing note reviewed.  Constitutional:      Appearance: Normal appearance.  HENT:     Head: Atraumatic.     Mouth/Throat:     Mouth: Mucous membranes are moist.  Eyes:     Extraocular Movements: Extraocular movements intact.     Conjunctiva/sclera: Conjunctivae normal.  Cardiovascular:     Rate and Rhythm: Normal rate and regular rhythm.  Pulmonary:     Effort: Pulmonary effort is normal.     Breath sounds: Normal breath sounds.  Abdominal:     General: Bowel sounds are normal. There is no distension.     Palpations: Abdomen is soft.     Tenderness: There is abdominal tenderness. There is right CVA tenderness and left CVA tenderness. There is no guarding.     Comments: Minimal generalized tenderness to palpation across abdomen.  No distention, guarding Bilateral CVA tenderness, worse on the right  Musculoskeletal:        General: Normal range of motion.     Cervical back: Normal range of motion and neck supple.  Skin:    General: Skin is warm and dry.  Neurological:     General: No focal deficit present.     Mental Status: He is oriented to person, place, and time.     Motor: No weakness.     Gait: Gait normal.  Psychiatric:        Mood and Affect: Mood normal.        Thought Content: Thought content normal.        Judgment: Judgment normal.     UC  Treatments / Results  Labs (  all labs ordered are listed, but only abnormal results are displayed) Labs Reviewed  POCT URINALYSIS DIP (MANUAL ENTRY) - Abnormal; Notable for the following components:      Result Value   Bilirubin, UA small (*)    Spec Grav, UA >=1.030 (*)    Blood, UA trace-intact (*)    Protein Ur, POC =30 (*)    All other components within normal limits  CBC WITH DIFFERENTIAL/PLATELET  COMPREHENSIVE METABOLIC PANEL  LIPASE    EKG   Radiology No results found.  Procedures Procedures (including critical care time)  Medications Ordered in UC Medications - No data to display  Initial Impression / Assessment and Plan / UC Course  I have reviewed the triage vital signs and the nursing notes.  Pertinent labs & imaging results that were available during my care of the patient were reviewed by me and considered in my medical decision making (see chart for details).     Vital signs benign and reassuring today, urinalysis with evidence of possible mild dehydration likely secondary to diarrhea.  Unclear if these symptoms are related to recent influenza or new viral illness, treat symptomatically with Imodium, fluids, electrolytes and await lab results.  ED for any worsening symptoms.  Final Clinical Impressions(s) / UC Diagnoses   Final diagnoses:  Flank pain  Generalized abdominal pain  Diarrhea, unspecified type   Discharge Instructions   None    ED Prescriptions   None    PDMP not reviewed this encounter.   Volney American, [place] 10/11/21 1711

## 2021-10-11 NOTE — ED Triage Notes (Signed)
Diarrhea since last week.  Back pain on both sides of back since Sunday.

## 2021-10-12 LAB — CBC WITH DIFFERENTIAL/PLATELET
Basophils Absolute: 0 10*3/uL (ref 0.0–0.2)
Basos: 0 %
EOS (ABSOLUTE): 0.1 10*3/uL (ref 0.0–0.4)
Eos: 2 %
Hematocrit: 49.6 % (ref 37.5–51.0)
Hemoglobin: 17 g/dL (ref 13.0–17.7)
Immature Grans (Abs): 0.1 10*3/uL (ref 0.0–0.1)
Immature Granulocytes: 1 %
Lymphocytes Absolute: 1.1 10*3/uL (ref 0.7–3.1)
Lymphs: 15 %
MCH: 29.3 pg (ref 26.6–33.0)
MCHC: 34.3 g/dL (ref 31.5–35.7)
MCV: 86 fL (ref 79–97)
Monocytes Absolute: 0.8 10*3/uL (ref 0.1–0.9)
Monocytes: 10 %
Neutrophils Absolute: 5.5 10*3/uL (ref 1.4–7.0)
Neutrophils: 72 %
Platelets: 177 10*3/uL (ref 150–450)
RBC: 5.8 x10E6/uL (ref 4.14–5.80)
RDW: 12.8 % (ref 11.6–15.4)
WBC: 7.5 10*3/uL (ref 3.4–10.8)

## 2021-10-12 LAB — COMPREHENSIVE METABOLIC PANEL
ALT: 12 IU/L (ref 0–44)
AST: 13 IU/L (ref 0–40)
Albumin/Globulin Ratio: 2.1 (ref 1.2–2.2)
Albumin: 4.1 g/dL (ref 3.8–4.9)
Alkaline Phosphatase: 100 IU/L (ref 44–121)
BUN/Creatinine Ratio: 16 (ref 9–20)
BUN: 14 mg/dL (ref 6–24)
Bilirubin Total: 0.3 mg/dL (ref 0.0–1.2)
CO2: 23 mmol/L (ref 20–29)
Calcium: 8.6 mg/dL — ABNORMAL LOW (ref 8.7–10.2)
Chloride: 106 mmol/L (ref 96–106)
Creatinine, Ser: 0.85 mg/dL (ref 0.76–1.27)
Globulin, Total: 2 g/dL (ref 1.5–4.5)
Glucose: 91 mg/dL (ref 70–99)
Potassium: 4.5 mmol/L (ref 3.5–5.2)
Sodium: 141 mmol/L (ref 134–144)
Total Protein: 6.1 g/dL (ref 6.0–8.5)
eGFR: 101 mL/min/{1.73_m2} (ref >59)

## 2021-10-12 LAB — LIPASE: Lipase: 37 U/L (ref 13–78)

## 2021-10-16 ENCOUNTER — Emergency Department (HOSPITAL_COMMUNITY): Payer: No Typology Code available for payment source

## 2021-10-16 ENCOUNTER — Emergency Department (HOSPITAL_COMMUNITY)
Admission: EM | Admit: 2021-10-16 | Discharge: 2021-10-16 | Disposition: A | Discharge status: Home / Self Care | Payer: No Typology Code available for payment source | Attending: Emergency Medicine | Admitting: Emergency Medicine

## 2021-10-16 ENCOUNTER — Encounter (HOSPITAL_COMMUNITY): Payer: Self-pay | Admitting: Emergency Medicine

## 2021-10-16 ENCOUNTER — Other Ambulatory Visit: Payer: Self-pay

## 2021-10-16 DIAGNOSIS — R350 Frequency of micturition: Secondary | ICD-10-CM | POA: Diagnosis not present

## 2021-10-16 DIAGNOSIS — Z7982 Long term (current) use of aspirin: Secondary | ICD-10-CM | POA: Insufficient documentation

## 2021-10-16 DIAGNOSIS — R3121 Asymptomatic microscopic hematuria: Secondary | ICD-10-CM | POA: Diagnosis not present

## 2021-10-16 DIAGNOSIS — N281 Cyst of kidney, acquired: Secondary | ICD-10-CM | POA: Insufficient documentation

## 2021-10-16 DIAGNOSIS — Z91010 Allergy to peanuts: Secondary | ICD-10-CM | POA: Diagnosis not present

## 2021-10-16 DIAGNOSIS — M545 Low back pain, unspecified: Secondary | ICD-10-CM | POA: Insufficient documentation

## 2021-10-16 DIAGNOSIS — R319 Hematuria, unspecified: Secondary | ICD-10-CM | POA: Diagnosis not present

## 2021-10-16 DIAGNOSIS — K429 Umbilical hernia without obstruction or gangrene: Secondary | ICD-10-CM | POA: Diagnosis not present

## 2021-10-16 LAB — CBC WITH DIFFERENTIAL/PLATELET
Abs Immature Granulocytes: 0.03 10*3/uL (ref 0.00–0.07)
Basophils Absolute: 0 10*3/uL (ref 0.0–0.1)
Basophils Relative: 0 %
Eosinophils Absolute: 0.1 10*3/uL (ref 0.0–0.5)
Eosinophils Relative: 1 %
HCT: 49.2 % (ref 39.0–52.0)
Hemoglobin: 16.5 g/dL (ref 13.0–17.0)
Immature Granulocytes: 0 %
Lymphocytes Relative: 10 %
Lymphs Abs: 0.9 10*3/uL (ref 0.7–4.0)
MCH: 29.5 pg (ref 26.0–34.0)
MCHC: 33.5 g/dL (ref 30.0–36.0)
MCV: 88 fL (ref 80.0–100.0)
Monocytes Absolute: 0.6 10*3/uL (ref 0.1–1.0)
Monocytes Relative: 7 %
Neutro Abs: 7.3 10*3/uL (ref 1.7–7.7)
Neutrophils Relative %: 82 %
Platelets: 255 10*3/uL (ref 150–400)
RBC: 5.59 MIL/uL (ref 4.22–5.81)
RDW: 12.2 % (ref 11.5–15.5)
WBC: 8.9 10*3/uL (ref 4.0–10.5)
nRBC: 0 % (ref 0.0–0.2)

## 2021-10-16 LAB — URINALYSIS, ROUTINE W REFLEX MICROSCOPIC
Bacteria, UA: NONE SEEN
Bilirubin Urine: NEGATIVE
Glucose, UA: NEGATIVE mg/dL
Ketones, ur: NEGATIVE mg/dL
Leukocytes,Ua: NEGATIVE
Nitrite: NEGATIVE
Protein, ur: NEGATIVE mg/dL
Specific Gravity, Urine: 1.016 (ref 1.005–1.030)
pH: 6 (ref 5.0–8.0)

## 2021-10-16 LAB — COMPREHENSIVE METABOLIC PANEL
ALT: 17 U/L (ref 0–44)
AST: 16 U/L (ref 15–41)
Albumin: 3.7 g/dL (ref 3.5–5.0)
Alkaline Phosphatase: 78 U/L (ref 38–126)
Anion gap: 6 (ref 5–15)
BUN: 14 mg/dL (ref 6–20)
CO2: 28 mmol/L (ref 22–32)
Calcium: 9.1 mg/dL (ref 8.9–10.3)
Chloride: 101 mmol/L (ref 98–111)
Creatinine, Ser: 0.94 mg/dL (ref 0.61–1.24)
GFR, Estimated: >60 mL/min (ref >60)
Glucose, Bld: 86 mg/dL (ref 70–99)
Potassium: 4.3 mmol/L (ref 3.5–5.1)
Sodium: 135 mmol/L (ref 135–145)
Total Bilirubin: 0.9 mg/dL (ref 0.3–1.2)
Total Protein: 7.2 g/dL (ref 6.5–8.1)

## 2021-10-16 LAB — CK: Total CK: 43 U/L — ABNORMAL LOW (ref 49–397)

## 2021-10-16 LAB — LIPASE, BLOOD: Lipase: 33 U/L (ref 11–51)

## 2021-10-16 MED ORDER — SODIUM CHLORIDE 0.9 % IV BOLUS
1000.0000 mL | Freq: Once | INTRAVENOUS | Status: AC
Start: 2021-10-16 — End: 2021-10-16
  Administered 2021-10-16: 11:00:00 1000 mL via INTRAVENOUS

## 2021-10-16 MED ORDER — KETOROLAC TROMETHAMINE 15 MG/ML IJ SOLN
15.0000 mg | Freq: Once | INTRAMUSCULAR | Status: AC
  Administered 2021-10-16: 11:00:00 15 mg via INTRAVENOUS
  Filled 2021-10-16: qty 1

## 2021-10-16 NOTE — ED Notes (Signed)
Pt returned from CT °

## 2021-10-16 NOTE — ED Triage Notes (Signed)
Pt to the ED with continued low back/ flank pain with increased urinary frequency.

## 2021-10-16 NOTE — Discharge Instructions (Addendum)
If you develop worsening, recurrent, or continued back pain, numbness or weakness in the legs, incontinence of your bowels or bladders, numbness of your buttocks, fever, abdominal pain, or any other new/concerning symptoms then return to the ER for evaluation.   Your CT scan shows cysts on your right kidney. You need an outpatient MRI that your primary care physician can order to help work up the blood in your urine.

## 2021-10-16 NOTE — ED Notes (Signed)
ED Provider at bedside. 

## 2021-10-16 NOTE — ED Notes (Signed)
Patient transported to CT 

## 2021-10-16 NOTE — ED Provider Notes (Signed)
[facility] EMERGENCY DEPARTMENT Provider Note   CSN: [id number] Arrival date & time: 10/16/21  7106     History  Chief Complaint  Patient presents with   Flank Pain    Clinton Ross is a 56 y.o. male.  HPI 56 year old male presents with a chief complaint of back pain.  He has been dealing with this pain since he had the flu a couple weeks ago.  It seemed to get better but then over the last 5 days has been worse.  It is from his inferior ribs down to his low back and diffuse.  It is on both sides but also in the middle.  No leg weakness or numbness.  No incontinence.  He has been constipated and has not had much out of his ostomy.  He otherwise denies any new fevers and seems to have otherwise recovered from the flu.  He will take Tylenol and will seem to help enough for him to go to sleep.  It is a continuous sensation that does seem to get worse when he sits in a certain position for a while.  However no specific positional changes.  No colicky type pain.  Pain is rated as a 5.  He was told he had blood in his urine when he went to urgent care a few days ago and has increased fluid intake since then.  He has not noticed blood but has been noticing darker urine.  Home Medications Prior to Admission medications   Medication Sig Start Date End Date Taking? Authorizing Provider  amLODipine (NORVASC) 5 MG tablet Take 1 tablet (5 mg total) by mouth daily. Patient taking differently: Take 10 mg by mouth daily.  06/11/18   Kathie Dike, MD  aspirin 81 MG chewable tablet Chew 1 tablet (81 mg total) by mouth daily. Patient taking differently: Chew 81 mg by mouth 3 (three) times a week.  02/23/18   Julianne Rice, MD  cyclobenzaprine (FLEXERIL) 10 MG tablet Take 1 tablet (10 mg total) by mouth at bedtime. 03/23/20   Wurst, [place], PA-C  doxycycline (VIBRAMYCIN) 100 MG capsule Take 1 capsule (100 mg total) by mouth 2 (two) times daily. 03/23/20   Wurst, [place], PA-C  fluticasone (FLONASE) 50  MCG/ACT nasal spray Place 1 spray into both nostrils 2 (two) times daily. 10/05/21   Volney American, PA-C  ibuprofen (ADVIL) 800 MG tablet Take 1 tablet (800 mg total) by mouth 3 (three) times daily. 10/14/20   Triplett, Tammy, PA-C  nitroGLYCERIN (NITROSTAT) 0.4 MG SL tablet Place 1 tablet (0.4 mg total) under the tongue every 5 (five) minutes as needed for chest pain. 02/23/18   Julianne Rice, MD  oxyCODONE-acetaminophen (PERCOCET/ROXICET) 5-325 MG tablet Take 1 tablet by mouth every 4 (four) hours as needed. 10/14/20   Triplett, Tammy, PA-C  predniSONE (DELTASONE) 20 MG tablet Take 2 tablets (40 mg total) by mouth daily with breakfast. 05/28/21   Scot Jun, FNP  predniSONE (DELTASONE) 50 MG tablet Take 1 tab daily with breakfast for 3 days 10/05/21   Volney American, PA-C      Allergies    Peanut-containing drug products and Morphine and related    Review of Systems   Review of Systems  Constitutional:  Negative for fever.  Gastrointestinal:  Positive for constipation. Negative for abdominal pain, diarrhea and vomiting.  Genitourinary:  Negative for dysuria and hematuria.  Musculoskeletal:  Positive for back pain.  Neurological:  Negative for weakness and numbness.   Physical  Exam Updated Vital Signs BP (!) 162/99    Pulse 65    Temp 98.3 F (36.8 C) (Oral)    Resp 14    Ht 5\' 10"  (1.778 m)    Wt 79.4 kg    SpO2 96%    BMI 25.12 kg/m  Physical Exam Vitals and nursing note reviewed.  Constitutional:      Appearance: He is well-developed.  HENT:     Head: Normocephalic and atraumatic.  Cardiovascular:     Rate and Rhythm: Normal rate and regular rhythm.     Heart sounds: Normal heart sounds.  Pulmonary:     Effort: Pulmonary effort is normal.     Breath sounds: Normal breath sounds.  Abdominal:     Palpations: Abdomen is soft.     Tenderness: There is no abdominal tenderness.  Musculoskeletal:     Thoracic back: Tenderness present.     Lumbar back:  Tenderness present.       Back:     Comments: Patient has diffuse mild tenderness throughout his lumbar and inferior thoracic back.  This involves the CVA bilaterally but it is diffuse.  There is no rash/skin changes.  Skin:    General: Skin is warm and dry.  Neurological:     Mental Status: He is alert.     Comments: 5/5 strength in BLE. Grossly normal sensation    ED Results / Procedures / Treatments   Labs (all labs ordered are listed, but only abnormal results are displayed) Labs Reviewed  URINALYSIS, ROUTINE W REFLEX MICROSCOPIC - Abnormal; Notable for the following components:      Result Value   Hgb urine dipstick MODERATE (*)    All other components within normal limits  CK - Abnormal; Notable for the following components:   Total CK 43 (*)    All other components within normal limits  COMPREHENSIVE METABOLIC PANEL  LIPASE, BLOOD  CBC WITH DIFFERENTIAL/PLATELET    EKG None  Radiology CT Renal Stone Study  Result Date: 10/16/2021 CLINICAL DATA:  Low back pain. Increased urinary frequency. Hematuria. EXAM: CT ABDOMEN AND PELVIS WITHOUT CONTRAST TECHNIQUE: Multidetector CT imaging of the abdomen and pelvis was performed following the standard protocol without IV contrast. RADIATION DOSE REDUCTION: This exam was performed according to the departmental dose-optimization program which includes automated exposure control, adjustment of the mA and/or kV according to patient size and/or use of iterative reconstruction technique. COMPARISON:  Multiple exams, including 03/04/2019 and 11/28/2019 FINDINGS: Lower chest: Dependent atelectasis in both lower lobes. Mild airway thickening in the lower lobes. Bandlike scarring anteriorly in the right lower lobe measuring about 0.9 by 0.7 by 0.4 cm, no substantial change comparing back through 07/21/2016. Faint calcifications in the left anterior descending coronary artery wall. Hepatobiliary: Cholecystectomy.  Otherwise unremarkable. Pancreas:  Unremarkable Spleen: 1.5 by 1.6 cm hypodense lesion in the inferomedial spleen on image 25 series 2, proximally 20 Hounsfield units in density. I measure this on the 11/28/2019 exam at 1.3 by 1.5 cm although some of the difference in size could be due to slice selection. Looking back further on 05/07/2018, I measure this lesion about 1.4 by 1.4 cm. Adrenals/Urinary Tract: Fluid density right mid kidney cyst 3.6 by 2.9 cm. A second faint hypodense right mid kidney lesion is present on image 35 of series 2 and is nonspecific. Both adrenal glands appear normal. No hydronephrosis or hydroureter. The urinary bladder appears normal. No urinary tract calculi observed. A definite cause for the patient's hematuria is  not identified. Stomach/Bowel: Left lower quadrant sigmoid end colostomy. No rectal pouch identified. Borderline prominence of stool in the remaining colon. Vascular/Lymphatic: Atherosclerosis is present, including aortoiliac atherosclerotic disease. No pathologic adenopathy identified. Reproductive: Somewhat low position of the prostate gland as on prior exams, with some mild accentuated density along the left posterolateral peripheral zone margin as on prior exams. Other: Presacral density is likely therapy related and appears chronic. Prior penile implant has been removed. Musculoskeletal: Small supraumbilical hernia on image 58 series 6 contains adipose tissue. Small direct right inguinal hernia contains adipose tissue. Lumbar spondylosis and degenerative disc disease likely with mild bilateral foraminal impingement at the L4-5 level. IMPRESSION: 1. No urinary tract calculi identified. 2. Right renal cysts noted. A second right renal hypodensity in the mid kidney is technically nonspecific; this corresponds in location to a probable small cyst on 05/07/2018 but has enlarged in the interim. If hematuria persists consider renal protocol MRI with and without contrast. 3. Airway thickening is present, suggesting  bronchitis or reactive airways disease. 4. Aortic Atherosclerosis (ICD10-I70.0). Faint left anterior descending coronary atherosclerosis. Systemic atherosclerosis. 5. 1.5 by 1.6 cm nonspecific hypodense splenic lesion. This measured 1.4 cm back in 2019. 6. Prior rectosigmoid resection with left lower quadrant sigmoid end colostomy, and therapy related findings in the presacral space. 7. Small supraumbilical hernia contains adipose tissue. Small direct right inguinal hernia contains adipose tissue. 8. Mild bilateral foraminal impingement at L4-5. Electronically Signed   By: Van Clines M.D.   On: 10/16/2021 13:56    Procedures Procedures    Medications Ordered in ED Medications  ketorolac (TORADOL) 15 MG/ML injection 15 mg (15 mg Intravenous Given 10/16/21 1109)  sodium chloride 0.9 % bolus 1,000 mL (0 mLs Intravenous Stopped 10/16/21 1443)    ED Course/ Medical Decision Making/ A&P                            Unclear cause of his back pain. It is diffuse. No red flags for spinal pathology. Given recent illness, CK obtained, no signs of rhabdo. Has continued microscopic hematuria, CT ordered, no acute cause seen. Labs otherwise benign. He's feeling better with toradol. Will have him follow up with PCP. Given return precautions.  Labs reviewed/interpreted by me: benign except microscopic hematuria  CT images reviewed/interpreted by me: no kidney stone.   Given IV toradol with improvement in pain  Will refer to urology based on microscopic hematuria.  He was made aware of need for outpatient MRI, will follow up with PCP        Final Clinical Impression(s) / ED Diagnoses Final diagnoses:  Acute bilateral low back pain without sciatica  Renal cyst, right  Asymptomatic microscopic hematuria    Rx / DC Orders ED Discharge Orders     None         Sherwood Gambler, MD 10/16/21 2145

## 2021-12-31 ENCOUNTER — Ambulatory Visit
Admission: EM | Admit: 2021-12-31 | Discharge: 2021-12-31 | Disposition: A | Discharge status: Home / Self Care | Payer: No Typology Code available for payment source | Attending: Urgent Care | Admitting: Urgent Care

## 2021-12-31 DIAGNOSIS — M5442 Lumbago with sciatica, left side: Secondary | ICD-10-CM | POA: Diagnosis not present

## 2021-12-31 DIAGNOSIS — M5136 Other intervertebral disc degeneration, lumbar region: Secondary | ICD-10-CM

## 2021-12-31 DIAGNOSIS — G8929 Other chronic pain: Secondary | ICD-10-CM | POA: Diagnosis not present

## 2021-12-31 MED ORDER — KETOROLAC TROMETHAMINE 60 MG/2ML IM SOLN
30.0000 mg | Freq: Once | INTRAMUSCULAR | Status: AC
  Administered 2021-12-31: 30 mg via INTRAMUSCULAR

## 2021-12-31 MED ORDER — KETOROLAC TROMETHAMINE 60 MG/2ML IM SOLN: 60.0000 mg | Freq: Once | INTRAMUSCULAR | Status: DC

## 2021-12-31 MED ORDER — PREDNISONE 50 MG PO TABS: 50.0000 mg | ORAL_TABLET | Freq: Every day | ORAL | 0 refills | Status: DC

## 2021-12-31 NOTE — ED Provider Notes (Signed)
?[facility] ? ? ?MRN: [id number] DOB: 1966-05-25 ? ?Subjective:  ? ?Clinton Ross is a 56 y.o. male presenting for 3-day history of acute on chronic low back pain of the left side.  Has a history of a bulging disc, degenerative disc disease, sciatica.  He has seen a chiropractor and has a back specialist with [place].  No recent fall, trauma.  The only thing that happened was that he bent over at the level of his waist to pick up a piece of paper and his back pain started thereafter.  Has previously responded to Toradol injections very well.  Has also done well with his chiropractor more so than anybody else.  Denies history of heart disease, MI.  He is a smoker.  He does have prednisone that he could use at home and a muscle relaxant but his primary goal is to get Toradol. ? ?No current facility-administered medications for this encounter. ? ?Current Outpatient Medications:  ?  amLODipine (NORVASC) 5 MG tablet, Take 1 tablet (5 mg total) by mouth daily. (Patient taking differently: Take 10 mg by mouth daily. ), Disp: 30 tablet, Rfl: 0 ?  aspirin 81 MG chewable tablet, Chew 1 tablet (81 mg total) by mouth daily. (Patient taking differently: Chew 81 mg by mouth 3 (three) times a week. ), Disp: 30 tablet, Rfl: 0 ?  cyclobenzaprine (FLEXERIL) 10 MG tablet, Take 1 tablet (10 mg total) by mouth at bedtime., Disp: 15 tablet, Rfl: 0 ?  doxycycline (VIBRAMYCIN) 100 MG capsule, Take 1 capsule (100 mg total) by mouth 2 (two) times daily., Disp: 20 capsule, Rfl: 0 ?  fluticasone (FLONASE) 50 MCG/ACT nasal spray, Place 1 spray into both nostrils 2 (two) times daily., Disp: 16 g, Rfl: 0 ?  ibuprofen (ADVIL) 800 MG tablet, Take 1 tablet (800 mg total) by mouth 3 (three) times daily., Disp: 21 tablet, Rfl: 0 ?  nitroGLYCERIN (NITROSTAT) 0.4 MG SL tablet, Place 1 tablet (0.4 mg total) under the tongue every 5 (five) minutes as needed for chest pain., Disp: 30 tablet, Rfl: 0 ?  oxyCODONE-acetaminophen  (PERCOCET/ROXICET) 5-325 MG tablet, Take 1 tablet by mouth every 4 (four) hours as needed., Disp: 8 tablet, Rfl: 0 ?  predniSONE (DELTASONE) 20 MG tablet, Take 2 tablets (40 mg total) by mouth daily with breakfast., Disp: 10 tablet, Rfl: 0 ?  predniSONE (DELTASONE) 50 MG tablet, Take 1 tab daily with breakfast for 3 days, Disp: 3 tablet, Rfl: 0  ? ?Allergies  ?Allergen Reactions  ? Peanut-Containing Drug Products Itching  ?  Walnuts  ? Morphine And Related   ?  Altered mental status-burning sensation all over  ? ? ?Past Medical History:  ?Diagnosis Date  ? Acute pancreatitis 10/2013  ? Arthritis   ? Bulging disc   ? Cancer [facility])   ? Colon  ? Chronic knee pain   ? Colostomy in place [facility])   ? Gallstones   ? Hypertension   ? PTSD (post-traumatic stress disorder)   ? Sciatica   ?  ? ?Past Surgical History:  ?Procedure Laterality Date  ? CHOLECYSTECTOMY    ? COLON SURGERY    ? rectal cancer Right   ? ? ?Family History  ?Problem Relation Age of Onset  ? Hypertension Mother   ? Stroke Father   ? Diabetes Other   ? ? ?Social History  ? ?Tobacco Use  ? Smoking status: Every Day  ?  Packs/day: 0.50  ?  Years: 30.00  ?  Pack years: 15.00  ?  Types: Cigarettes  ? Smokeless tobacco: Never  ?Vaping Use  ? Vaping Use: Never used  ?Substance Use Topics  ? Alcohol use: No  ? Drug use: No  ? ? ?ROS ? ? ?Objective:  ? ?Vitals: ?BP (!) 148/84   Pulse 76   Temp 98 ?F (36.7 ?C)   Resp 18   SpO2 94%  ? ?Physical Exam ?Constitutional:   ?   General: He is not in acute distress. ?   Appearance: Normal appearance. He is well-developed and normal weight. He is not ill-appearing, toxic-appearing or diaphoretic.  ?HENT:  ?   Head: Normocephalic and atraumatic.  ?   Right Ear: External ear normal.  ?   Left Ear: External ear normal.  ?   Nose: Nose normal.  ?   Mouth/Throat:  ?   Pharynx: Oropharynx is clear.  ?Eyes:  ?   General: No scleral icterus.    ?   Right eye: No discharge.     ?   Left eye: No discharge.  ?   Extraocular Movements:  Extraocular movements intact.  ?Cardiovascular:  ?   Rate and Rhythm: Normal rate.  ?Pulmonary:  ?   Effort: Pulmonary effort is normal.  ?Musculoskeletal:  ?   Cervical back: Normal range of motion.  ?   Comments: Full range of motion throughout.  Strength 5/5 for lower extremities.  Patient ambulates without any assistance at expected pace.  No ecchymosis, swelling, lacerations or abrasions.  Patient does have paraspinal muscle tenderness along the left lumbar region of his back excluding the midline.  ?Neurological:  ?   Mental Status: He is alert and oriented to person, place, and time.  ?Psychiatric:     ?   Mood and Affect: Mood normal.     ?   Behavior: Behavior normal.     ?   Thought Content: Thought content normal.     ?   Judgment: Judgment normal.  ? ?IM Toradol in clinic at 30 mg. ? ?Assessment and Plan :  ? ?PDMP not reviewed this encounter. ? ?1. Acute left-sided low back pain with left-sided sciatica   ?2. Bulging lumbar disc   ?3. Chronic left-sided low back pain with left-sided sciatica   ? ?I explained the risks of using NSAIDs, Toradol in the context of his smoking, age, hypertension.  Patient acknowledges risk of thrombotic event.  Still would like to try this.  Recommended an oral prednisone course starting tomorrow.  Follow-up with his back specialist and/or chiropractor. Counseled patient on potential for adverse effects with medications prescribed/recommended today, ER and return-to-clinic precautions discussed, patient verbalized understanding. ? ?  ?Jaynee Eagles, PA-C ?12/31/21 1919 ? ?

## 2021-12-31 NOTE — ED Triage Notes (Signed)
Pt presents with left lower back pain since Saturday ?

## 2022-01-20 ENCOUNTER — Other Ambulatory Visit: Payer: Self-pay

## 2022-01-20 ENCOUNTER — Encounter (HOSPITAL_COMMUNITY): Payer: Self-pay | Admitting: *Deleted

## 2022-01-20 ENCOUNTER — Emergency Department (HOSPITAL_COMMUNITY)
Admission: EM | Admit: 2022-01-20 | Discharge: 2022-01-20 | Disposition: A | Discharge status: Home / Self Care | Payer: No Typology Code available for payment source | Attending: Emergency Medicine | Admitting: Emergency Medicine

## 2022-01-20 DIAGNOSIS — Z7982 Long term (current) use of aspirin: Secondary | ICD-10-CM | POA: Diagnosis not present

## 2022-01-20 DIAGNOSIS — Z91010 Allergy to peanuts: Secondary | ICD-10-CM | POA: Diagnosis not present

## 2022-01-20 DIAGNOSIS — S70361A Insect bite (nonvenomous), right thigh, initial encounter: Secondary | ICD-10-CM | POA: Diagnosis not present

## 2022-01-20 DIAGNOSIS — S79921A Unspecified injury of right thigh, initial encounter: Secondary | ICD-10-CM | POA: Diagnosis present

## 2022-01-20 DIAGNOSIS — W57XXXA Bitten or stung by nonvenomous insect and other nonvenomous arthropods, initial encounter: Secondary | ICD-10-CM | POA: Insufficient documentation

## 2022-01-20 MED ORDER — DOXYCYCLINE HYCLATE 100 MG PO TABS
200.0000 mg | ORAL_TABLET | Freq: Once | ORAL | Status: AC
  Administered 2022-01-20: 200 mg via ORAL
  Filled 2022-01-20: qty 2

## 2022-01-20 NOTE — ED Triage Notes (Signed)
Pt with tick attached to right leg.  ?

## 2022-01-20 NOTE — Discharge Instructions (Signed)
Keep the wound clean and dry, soap and water, if things get worse with more swelling pain redness or fever return to your doctor or the emergency department ?

## 2022-01-20 NOTE — ED Provider Notes (Signed)
?[facility] ?Provider Note ? ? ?CSN: [id number] ?Arrival date & time: 01/20/22  2007 ? ?  ? ?History ? ?No chief complaint on file. ? ? ?Clinton Ross is a 56 y.o. male. ? ?HPI ? ?Patient presents with a tick in his right thigh, no other symptoms, he found it today and cannot get it out.  Mild pain ? ?Home Medications ?Prior to Admission medications   ?Medication Sig Start Date End Date Taking? Authorizing Provider  ?amLODipine (NORVASC) 5 MG tablet Take 1 tablet (5 mg total) by mouth daily. ?Patient taking differently: Take 10 mg by mouth daily.  06/11/18   Kathie Dike, MD  ?aspirin 81 MG chewable tablet Chew 1 tablet (81 mg total) by mouth daily. ?Patient taking differently: Chew 81 mg by mouth 3 (three) times a week.  02/23/18   Julianne Rice, MD  ?cyclobenzaprine (FLEXERIL) 10 MG tablet Take 1 tablet (10 mg total) by mouth at bedtime. 03/23/20   Wurst, [place], PA-C  ?doxycycline (VIBRAMYCIN) 100 MG capsule Take 1 capsule (100 mg total) by mouth 2 (two) times daily. 03/23/20   Wurst, [place], PA-C  ?fluticasone (FLONASE) 50 MCG/ACT nasal spray Place 1 spray into both nostrils 2 (two) times daily. 10/05/21   Volney American, PA-C  ?ibuprofen (ADVIL) 800 MG tablet Take 1 tablet (800 mg total) by mouth 3 (three) times daily. 10/14/20   Triplett, Tammy, PA-C  ?nitroGLYCERIN (NITROSTAT) 0.4 MG SL tablet Place 1 tablet (0.4 mg total) under the tongue every 5 (five) minutes as needed for chest pain. 02/23/18   Julianne Rice, MD  ?oxyCODONE-acetaminophen (PERCOCET/ROXICET) 5-325 MG tablet Take 1 tablet by mouth every 4 (four) hours as needed. 10/14/20   Triplett, Tammy, PA-C  ?predniSONE (DELTASONE) 50 MG tablet Take 1 tablet (50 mg total) by mouth daily with breakfast. 12/31/21   Jaynee Eagles, PA-C  ?   ? ?Allergies    ?Peanut-containing drug products and Morphine and related   ? ?Review of Systems   ?Review of Systems  ?Constitutional:  Negative for fever.  ?Skin:  Positive for rash.   ? ?Physical Exam ?Updated Vital Signs ?BP 138/90 (BP Location: Right Arm)   Pulse 78   Temp 98.8 ?F (37.1 ?C) (Oral)   Resp 16   SpO2 96%  ?Physical Exam ?Vitals and nursing note reviewed.  ?Constitutional:   ?   Appearance: He is well-developed. He is not diaphoretic.  ?HENT:  ?   Head: Normocephalic and atraumatic.  ?Eyes:  ?   General:     ?   Right eye: No discharge.     ?   Left eye: No discharge.  ?   Conjunctiva/sclera: Conjunctivae normal.  ?Pulmonary:  ?   Effort: Pulmonary effort is normal. No respiratory distress.  ?Skin: ?   General: Skin is warm and dry.  ?   Findings: No erythema or rash.  ?   Comments: Small tick embedded in the right thigh lateral distal thigh on the right.  ?Neurological:  ?   Mental Status: He is alert.  ?   Coordination: Coordination normal.  ? ? ?ED Results / Procedures / Treatments   ?Labs ?(all labs ordered are listed, but only abnormal results are displayed) ?Labs Reviewed - No data to display ? ?EKG ?None ? ?Radiology ?No results found. ? ?Procedures ?Marland KitchenForeign Body Removal ? ?Date/Time: 01/20/2022 8:32 PM ?Performed by: Noemi Chapel, MD ?Authorized by: Noemi Chapel, MD  ?Consent: Verbal consent obtained. ?Risks and benefits: risks, benefits  and alternatives were discussed ?Consent given by: patient ?Patient understanding: patient states understanding of the procedure being performed ?Required items: required blood products, implants, devices, and special equipment available ?Patient identity confirmed: verbally with patient ?Time out: Immediately prior to procedure a "time out" was called to verify the correct patient, procedure, equipment, support staff and site/side marked as required. ?Body area: skin ?General location: lower extremity ?Location details: right upper leg ? ?Sedation: ?Patient sedated: no ? ?Patient restrained: no ?Patient cooperative: yes ?Localization method: visualized ?Removal mechanism: forceps ?Tendon involvement: none ?Depth:  subcutaneous ?Complexity: simple ?1 objects recovered. ?Objects recovered: Tick ?Post-procedure assessment: foreign body removed ?Patient tolerance: patient tolerated the procedure well with no immediate complications ?Comments:  ? ?  ? ? ?Medications Ordered in ED ?Medications  ?doxycycline (VIBRA-TABS) tablet 200 mg (has no administration in time range)  ? ? ?ED Course/ Medical Decision Making/ A&P ?  ?                        ?Medical Decision Making ?Risk ?Prescription drug management. ? ? ?Tick removed, see procedure note, prophylactic dose of doxycycline given ? ? ? ? ? ? ? ?Final Clinical Impression(s) / ED Diagnoses ?Final diagnoses:  ?Tick bite of right thigh, initial encounter  ? ? ?Rx / DC Orders ?ED Discharge Orders   ? ? None  ? ?  ? ? ?  ?Noemi Chapel, MD ?01/20/22 2033 ? ?

## 2022-01-22 ENCOUNTER — Telehealth: Payer: Self-pay | Admitting: *Deleted

## 2022-01-22 NOTE — Telephone Encounter (Signed)
Transition Care Management Follow-up Telephone Call ?Date of discharge and from where: [facility] 01-20-2022 ?How have you been since you were released from the hospital? Doing fine ?Any questions or concerns? No ? ?Items Reviewed: ?Did the pt receive and understand the discharge instructions provided? Yes  ?Medications obtained and verified? Yes  ?Other? No  ?Any new allergies since your discharge? No  ?Dietary orders reviewed? No ?Do you have support at home? Yes  ? ?Home Care and Equipment/Supplies: ?Were home health services ordered? not applicable ?If so, what is the name of the agency?   ?Has the agency set up a time to come to the patient's home?  ?Were any new equipment or medical supplies ordered?   ?What is the name of the medical supply agency?  ?Were you able to get the supplies/equipment?  ?Do you have any questions related to the use of the equipment or supplies?  ? ?Functional Questionnaire: (I = Independent and D = Dependent) ?ADLs: I ? ?Bathing/Dressing- I ? ?Meal Prep- I ? ?Eating- I ? ?Maintaining continence- I ? ?Transferring/Ambulation- I ? ?Managing Meds- I ? ?Follow up appointments reviewed: ? ?PCP Hospital f/u appt confirmed? No   ?Specialist Hospital f/u appt confirmed?  Marland Kitchen ?Are transportation arrangements needed? No  ?If their condition worsens, is the pt aware to call PCP or go to the Emergency Dept.? Yes ?Was the patient provided with contact information for the PCP's office or ED? Yes ?Was to pt encouraged to call back with questions or concerns? Yes  ?

## 2022-02-05 ENCOUNTER — Emergency Department (HOSPITAL_COMMUNITY): Payer: No Typology Code available for payment source

## 2022-02-05 ENCOUNTER — Observation Stay (HOSPITAL_COMMUNITY): Payer: No Typology Code available for payment source

## 2022-02-05 ENCOUNTER — Encounter (HOSPITAL_COMMUNITY): Payer: Self-pay

## 2022-02-05 ENCOUNTER — Observation Stay (HOSPITAL_COMMUNITY)
Admission: EM | Admit: 2022-02-05 | Discharge: 2022-02-06 | Disposition: A | Discharge status: Home / Self Care | Payer: No Typology Code available for payment source | Attending: Internal Medicine | Admitting: Internal Medicine

## 2022-02-05 ENCOUNTER — Other Ambulatory Visit: Payer: Self-pay

## 2022-02-05 DIAGNOSIS — F17210 Nicotine dependence, cigarettes, uncomplicated: Secondary | ICD-10-CM | POA: Insufficient documentation

## 2022-02-05 DIAGNOSIS — Z720 Tobacco use: Secondary | ICD-10-CM | POA: Diagnosis present

## 2022-02-05 DIAGNOSIS — Z79899 Other long term (current) drug therapy: Secondary | ICD-10-CM | POA: Insufficient documentation

## 2022-02-05 DIAGNOSIS — E785 Hyperlipidemia, unspecified: Secondary | ICD-10-CM | POA: Diagnosis not present

## 2022-02-05 DIAGNOSIS — F1210 Cannabis abuse, uncomplicated: Secondary | ICD-10-CM | POA: Diagnosis not present

## 2022-02-05 DIAGNOSIS — I10 Essential (primary) hypertension: Secondary | ICD-10-CM | POA: Diagnosis present

## 2022-02-05 DIAGNOSIS — G459 Transient cerebral ischemic attack, unspecified: Secondary | ICD-10-CM | POA: Diagnosis not present

## 2022-02-05 DIAGNOSIS — Z85048 Personal history of other malignant neoplasm of rectum, rectosigmoid junction, and anus: Secondary | ICD-10-CM | POA: Insufficient documentation

## 2022-02-05 DIAGNOSIS — Z7982 Long term (current) use of aspirin: Secondary | ICD-10-CM | POA: Diagnosis not present

## 2022-02-05 DIAGNOSIS — I639 Cerebral infarction, unspecified: Secondary | ICD-10-CM | POA: Diagnosis not present

## 2022-02-05 DIAGNOSIS — I6521 Occlusion and stenosis of right carotid artery: Secondary | ICD-10-CM | POA: Diagnosis not present

## 2022-02-05 DIAGNOSIS — Z91010 Allergy to peanuts: Secondary | ICD-10-CM | POA: Diagnosis not present

## 2022-02-05 DIAGNOSIS — I63231 Cerebral infarction due to unspecified occlusion or stenosis of right carotid arteries: Secondary | ICD-10-CM | POA: Diagnosis not present

## 2022-02-05 DIAGNOSIS — R4781 Slurred speech: Secondary | ICD-10-CM | POA: Diagnosis not present

## 2022-02-05 DIAGNOSIS — Z85038 Personal history of other malignant neoplasm of large intestine: Secondary | ICD-10-CM | POA: Insufficient documentation

## 2022-02-05 LAB — CBC WITH DIFFERENTIAL/PLATELET
Abs Immature Granulocytes: 0.03 10*3/uL (ref 0.00–0.07)
Basophils Absolute: 0.1 10*3/uL (ref 0.0–0.1)
Basophils Relative: 1 %
Eosinophils Absolute: 0.2 10*3/uL (ref 0.0–0.5)
Eosinophils Relative: 2 %
HCT: 49.6 % (ref 39.0–52.0)
Hemoglobin: 16.5 g/dL (ref 13.0–17.0)
Immature Granulocytes: 0 %
Lymphocytes Relative: 12 %
Lymphs Abs: 0.9 10*3/uL (ref 0.7–4.0)
MCH: 29 pg (ref 26.0–34.0)
MCHC: 33.3 g/dL (ref 30.0–36.0)
MCV: 87.2 fL (ref 80.0–100.0)
Monocytes Absolute: 0.5 10*3/uL (ref 0.1–1.0)
Monocytes Relative: 6 %
Neutro Abs: 5.8 10*3/uL (ref 1.7–7.7)
Neutrophils Relative %: 79 %
Platelets: 215 10*3/uL (ref 150–400)
RBC: 5.69 MIL/uL (ref 4.22–5.81)
RDW: 13.2 % (ref 11.5–15.5)
WBC: 7.5 10*3/uL (ref 4.0–10.5)
nRBC: 0 % (ref 0.0–0.2)

## 2022-02-05 LAB — RAPID URINE DRUG SCREEN, HOSP PERFORMED
Amphetamines: NOT DETECTED
Barbiturates: NOT DETECTED
Benzodiazepines: NOT DETECTED
Cocaine: NOT DETECTED
Opiates: NOT DETECTED
Tetrahydrocannabinol: POSITIVE — AB

## 2022-02-05 LAB — CBC
HCT: 46.2 % (ref 39.0–52.0)
Hemoglobin: 15.8 g/dL (ref 13.0–17.0)
MCH: 29.4 pg (ref 26.0–34.0)
MCHC: 34.2 g/dL (ref 30.0–36.0)
MCV: 85.9 fL (ref 80.0–100.0)
Platelets: 204 10*3/uL (ref 150–400)
RBC: 5.38 MIL/uL (ref 4.22–5.81)
RDW: 12.8 % (ref 11.5–15.5)
WBC: 6.6 10*3/uL (ref 4.0–10.5)
nRBC: 0 % (ref 0.0–0.2)

## 2022-02-05 LAB — BASIC METABOLIC PANEL
Anion gap: 6 (ref 5–15)
BUN: 12 mg/dL (ref 6–20)
CO2: 26 mmol/L (ref 22–32)
Calcium: 9.1 mg/dL (ref 8.9–10.3)
Chloride: 106 mmol/L (ref 98–111)
Creatinine, Ser: 1.03 mg/dL (ref 0.61–1.24)
GFR, Estimated: >60 mL/min (ref >60)
Glucose, Bld: 88 mg/dL (ref 70–99)
Potassium: 3.8 mmol/L (ref 3.5–5.1)
Sodium: 138 mmol/L (ref 135–145)

## 2022-02-05 LAB — CBG MONITORING, ED: Glucose-Capillary: 106 mg/dL — ABNORMAL HIGH (ref 70–99)

## 2022-02-05 LAB — HEMOGLOBIN A1C
Hgb A1c MFr Bld: 5.5 % (ref 4.8–5.6)
Mean Plasma Glucose: 111.15 mg/dL

## 2022-02-05 LAB — PROTIME-INR
INR: 1 (ref 0.8–1.2)
Prothrombin Time: 13.2 seconds (ref 11.4–15.2)

## 2022-02-05 LAB — HIV ANTIBODY (ROUTINE TESTING W REFLEX): HIV Screen 4th Generation wRfx: NONREACTIVE

## 2022-02-05 IMAGING — MR MR MRA HEAD W/O CM
1 of 2 series · 7 of 48 positions shown · non-contrast
Comparison: None Available.

CLINICAL DATA: Transient ischemic attack

EXAM:
MRI HEAD WITHOUT CONTRAST
MRA HEAD WITHOUT CONTRAST
TECHNIQUE: Multiplanar, multi-echo pulse sequences of the brain and surrounding
structures were acquired without intravenous contrast. Angiographic
images of the Circle of Willis were acquired using MRA technique
without intravenous contrast.

[Series 6: DWI · axial · 4.0mm · 0.88mm/px · z∈[-49,+91]mm · 7 of 36 slices shown]
[im 1/36]
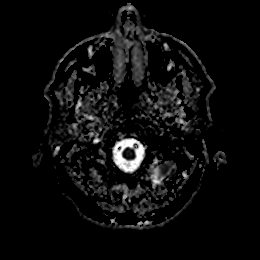
[im 6/36]
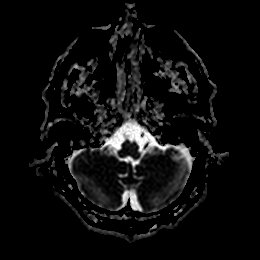
[im 12/36]
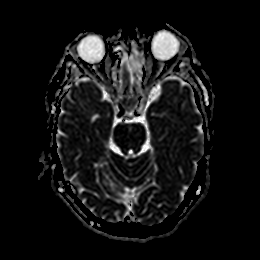
[im 18/36]
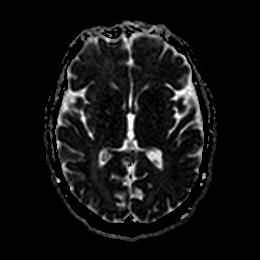
[im 24/36]
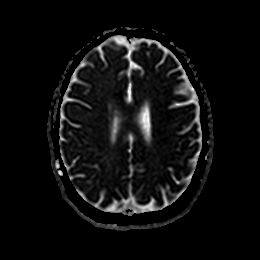
[im 30/36]
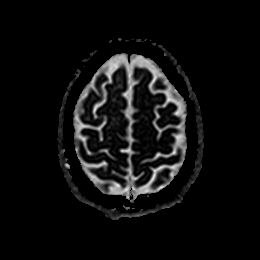
[im 36/36]
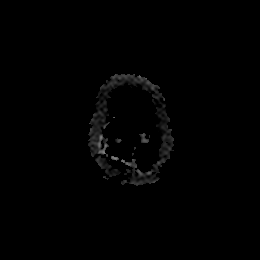

[7 of 48 positions shown; findings below may reference images not displayed]

FINDINGS: MRI HEAD

Brain: Small foci variably reduced diffusion are present in the
right frontal much greater than parietal and occipital lobes.

Ventricles and sulci are within normal limits in size and
configuration. Minimal susceptibility in the right occipital lobe
may reflect chronic blood products.

No intracranial mass or mass effect. No hydrocephalus or extra-axial
collection.

Vascular: Major vessel flow voids at the skull base are preserved.

Skull and upper cervical spine: Normal marrow signal is preserved.

Sinuses/Orbits: Paranasal sinuses are aerated. Orbits are
unremarkable.

Other: Sella is unremarkable.  Mastoid air cells are clear.

MRA HEAD

Motion artifact is present.

There is minimal flow related enhancement of the intracranial right
internal carotid artery. Partial reconstitution near the terminus.
Left intracranial internal carotid is patent with atherosclerotic
irregularity. Anterior and middle cerebral arteries are patent. Flow
related enhancement appears relatively diminished in the right MCA
territory. Anterior communicating artery appears to be present.

Included intracranial vertebral arteries are patent. Basilar artery
is patent. Posterior cerebral arteries are patent. Posterior
communicating arteries are not identified.
IMPRESSION: Small foci of acute and subacute infarction in the right frontal
lobe and to a lesser extent parietal and occipital lobes. There is
abnormal appearance of the visualized right internal carotid artery,
which may reflect high-grade stenosis more proximally.

Small chronic right occipital infarct.

## 2022-02-05 IMAGING — MR MR HEAD W/O CM
11 of 13 series · 27 of 48 positions shown · non-contrast
Comparison: None Available.

CLINICAL DATA: Transient ischemic attack

EXAM:
MRI HEAD WITHOUT CONTRAST
MRA HEAD WITHOUT CONTRAST
TECHNIQUE: Multiplanar, multi-echo pulse sequences of the brain and surrounding
structures were acquired without intravenous contrast. Angiographic
images of the Circle of Willis were acquired using MRA technique
without intravenous contrast.

[Series 5: DWI · axial · 4.0mm · 0.88mm/px · z∈[-49,+91]mm · 4 of 36 slices shown (1 of 6)]
[im 1/36]
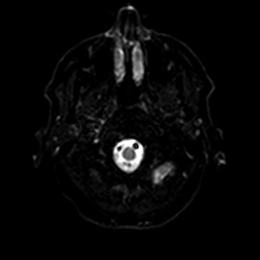
[im 12/36]
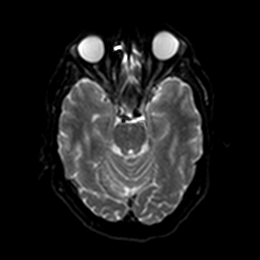
[im 24/36]
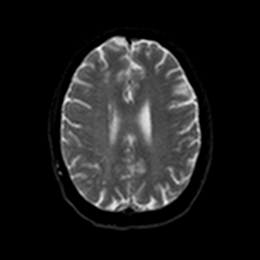
[im 36/36]
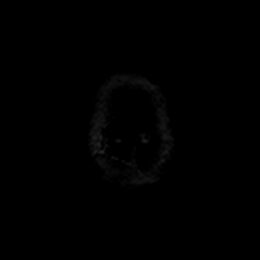

[Series 5: DWI · axial · 4.0mm · 0.88mm/px · z∈[-49,+91]mm · 3 of 36 slices shown (2 of 6)]
[im 1/36]
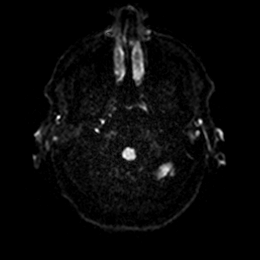
[im 18/36]
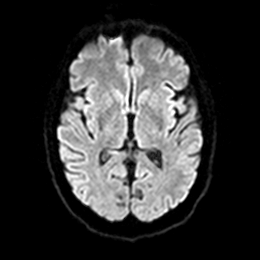
[im 36/36]
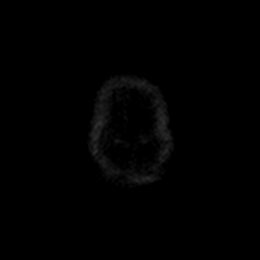

[Series 6: DWI · axial · 4.0mm · 0.88mm/px · z∈[-49,+91]mm · 3 of 36 slices shown (3 of 6)]
[im 1/36]
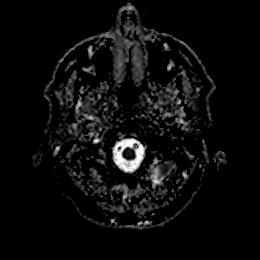
[im 18/36]
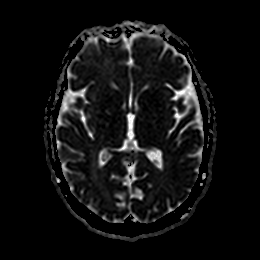
[im 36/36]
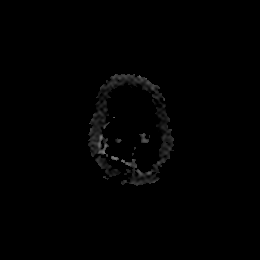

[Series 7: DWI · coronal · 5.0mm · 0.88mm/px · 2 of 28 slices shown (4 of 6)]
[im 1/28]
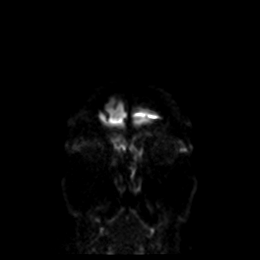
[im 28/28]
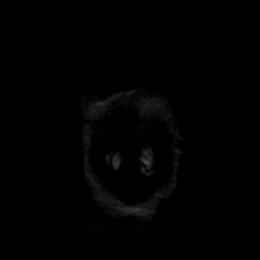

[Series 7: DWI · coronal · 5.0mm · 0.88mm/px · 2 of 28 slices shown (5 of 6)]
[im 1/28]
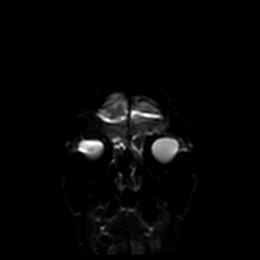
[im 28/28]
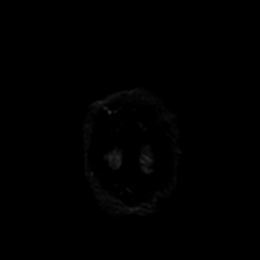

[Series 8: DWI · coronal · 5.0mm · 0.88mm/px · 2 of 28 slices shown (6 of 6)]
[im 1/28]
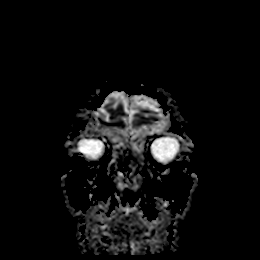
[im 28/28]
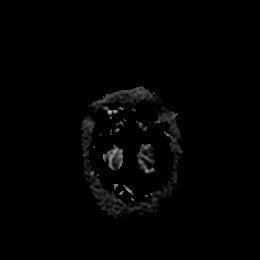

[Series 9: T1 · sagittal · 5.0mm · 0.94mm/px · 2 of 21 slices shown]
[im 1/21]
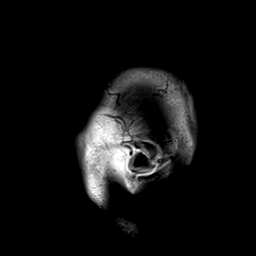
[im 21/21]
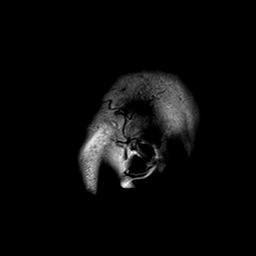

[Series 15: T2 · axial · 5.0mm · 0.72mm/px · z∈[-45,+87]mm · 2 of 20 slices shown (1 of 2)]
[im 1/20]
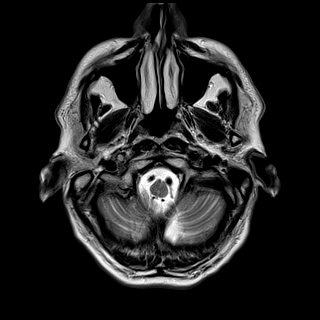
[im 20/20]
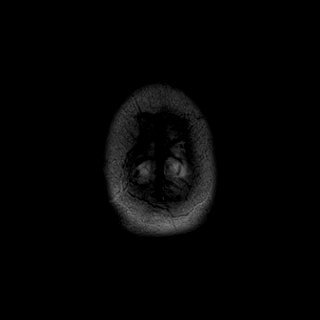

[Series 16: ax hemo · axial · 5.0mm · 0.86mm/px · z∈[-50,+94]mm · 2 of 25 slices shown]
[im 1/25]
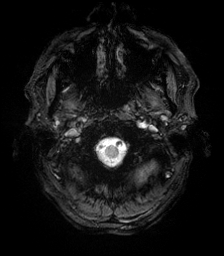
[im 25/25]
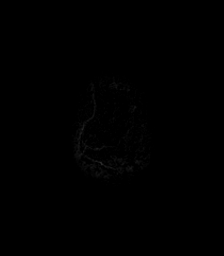

[Series 17: FLAIR · axial · 4.0mm · 0.43mm/px · z∈[-56,+100]mm · 3 of 40 slices shown]
[im 1/40]
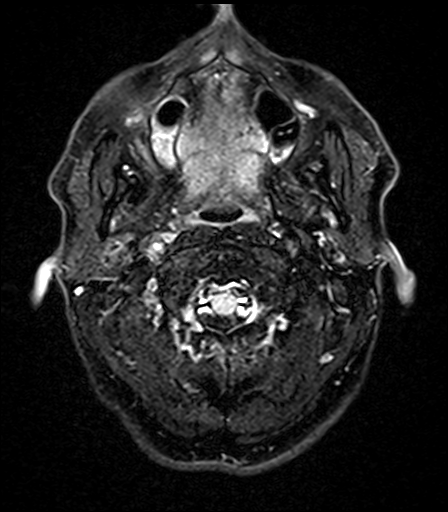
[im 20/40]
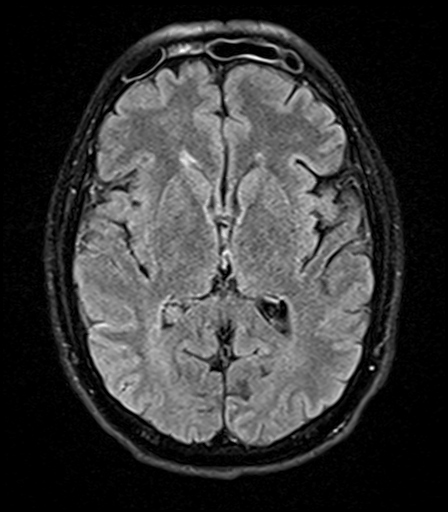
[im 40/40]
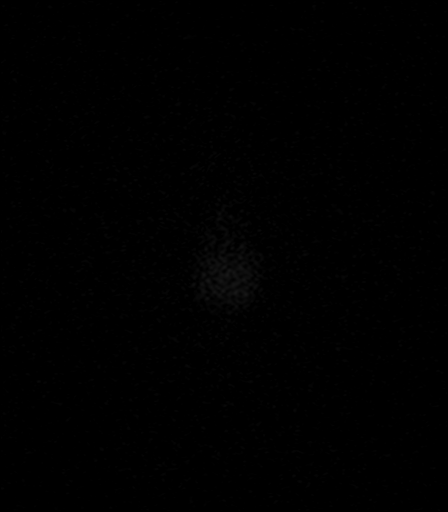

[Series 19: T2 · coronal · 5.0mm · 0.72mm/px · 2 of 28 slices shown (2 of 2)]
[im 1/28]
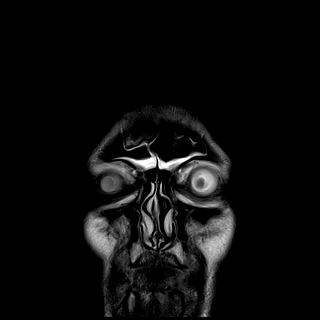
[im 28/28]
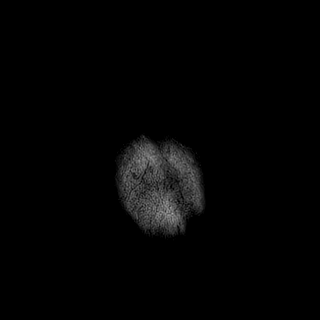

[27 of 48 positions shown; findings below may reference images not displayed]

FINDINGS: MRI HEAD

Brain: Small foci variably reduced diffusion are present in the
right frontal much greater than parietal and occipital lobes.

Ventricles and sulci are within normal limits in size and
configuration. Minimal susceptibility in the right occipital lobe
may reflect chronic blood products.

No intracranial mass or mass effect. No hydrocephalus or extra-axial
collection.

Vascular: Major vessel flow voids at the skull base are preserved.

Skull and upper cervical spine: Normal marrow signal is preserved.

Sinuses/Orbits: Paranasal sinuses are aerated. Orbits are
unremarkable.

Other: Sella is unremarkable.  Mastoid air cells are clear.

MRA HEAD

Motion artifact is present.

There is minimal flow related enhancement of the intracranial right
internal carotid artery. Partial reconstitution near the terminus.
Left intracranial internal carotid is patent with atherosclerotic
irregularity. Anterior and middle cerebral arteries are patent. Flow
related enhancement appears relatively diminished in the right MCA
territory. Anterior communicating artery appears to be present.

Included intracranial vertebral arteries are patent. Basilar artery
is patent. Posterior cerebral arteries are patent. Posterior
communicating arteries are not identified.
IMPRESSION: Small foci of acute and subacute infarction in the right frontal
lobe and to a lesser extent parietal and occipital lobes. There is
abnormal appearance of the visualized right internal carotid artery,
which may reflect high-grade stenosis more proximally.

Small chronic right occipital infarct.

## 2022-02-05 IMAGING — CT CT HEAD W/O CM
4 series · 16 of 47 positions shown, 18 images · non-contrast
Comparison: None Available.

CLINICAL DATA: Slurred speech.



[Series 2: head w o · axial · 0.43mm/px · z∈[+50,+170]mm · 7 of 33 slices shown, 9 images]
[im 5/33  brain]
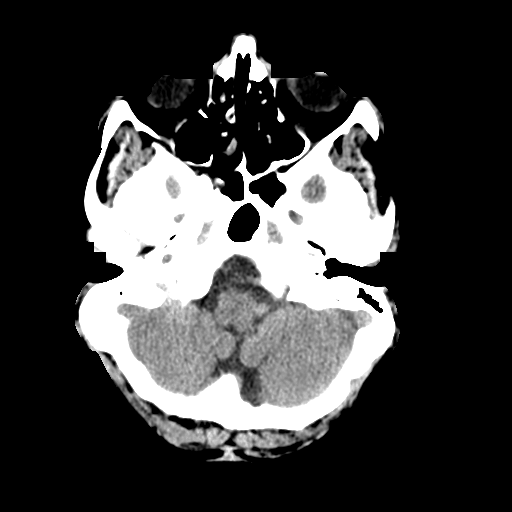
[im 5/33  bone]
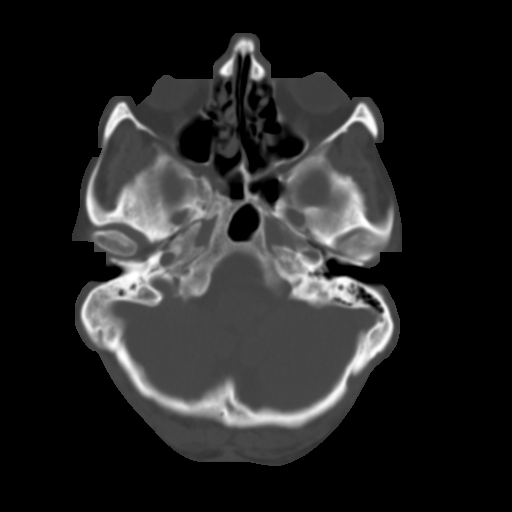
[im 9/33  brain]
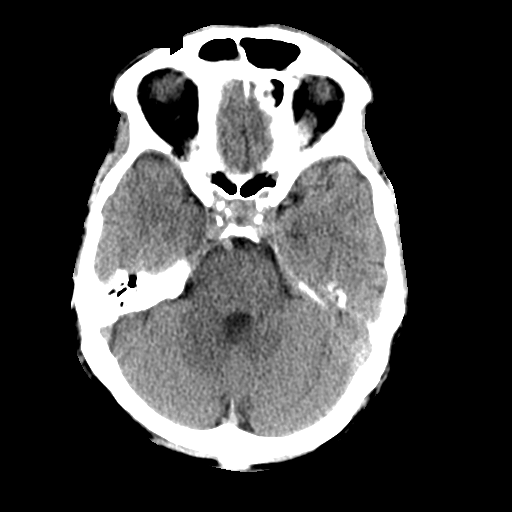
[im 13/33  brain]
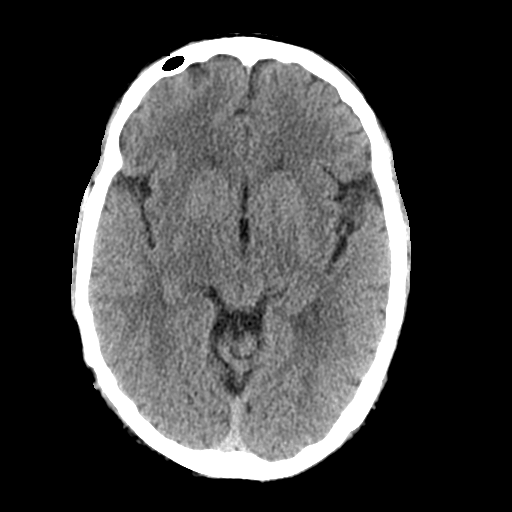
[im 17/33  brain]
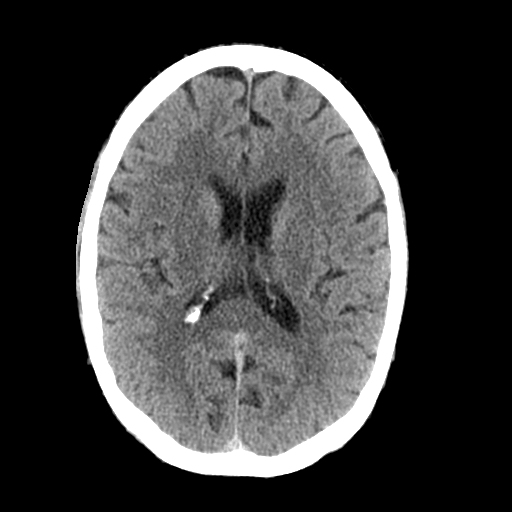
[im 21/33  brain]
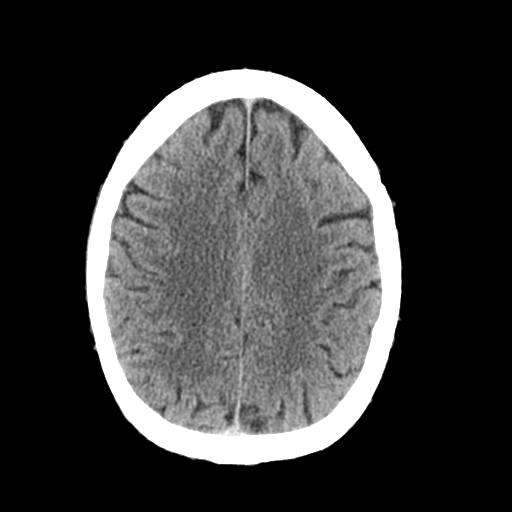
[im 21/33  bone]
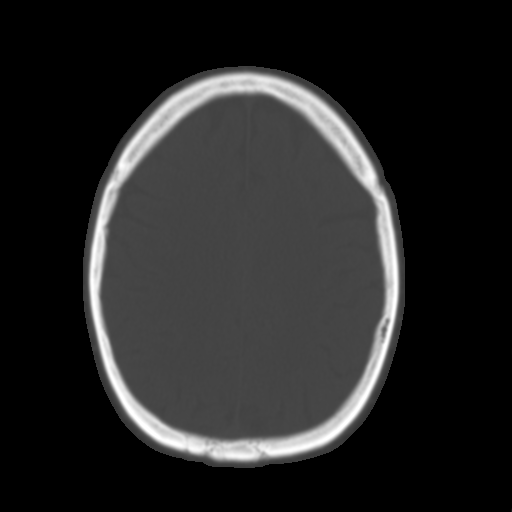
[im 25/33  brain]
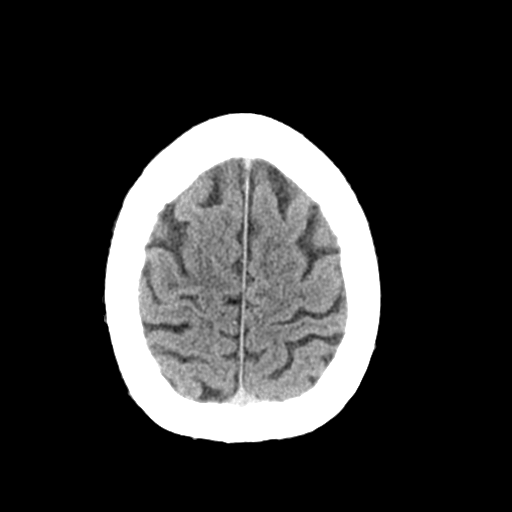
[im 29/33  brain]
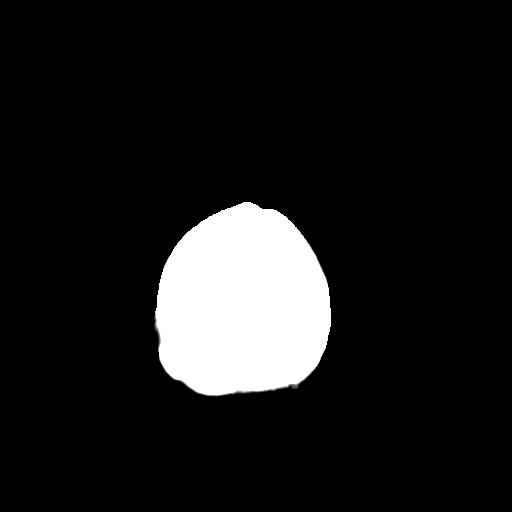

[Series 3: head bone · axial · 0.43mm/px · z∈[+46,+78]mm · 3 of 82 slices shown]
[im 9/82  bone]
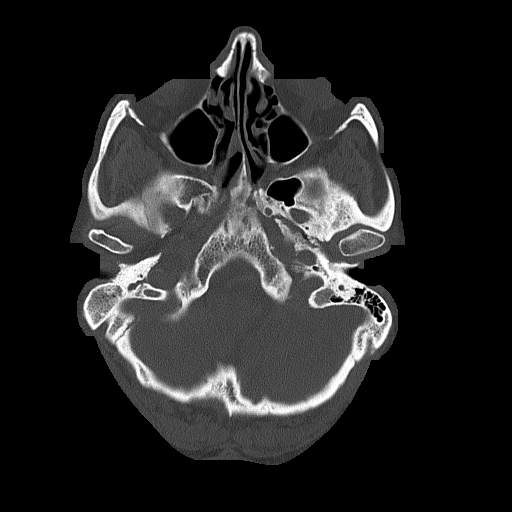
[im 17/82  bone]
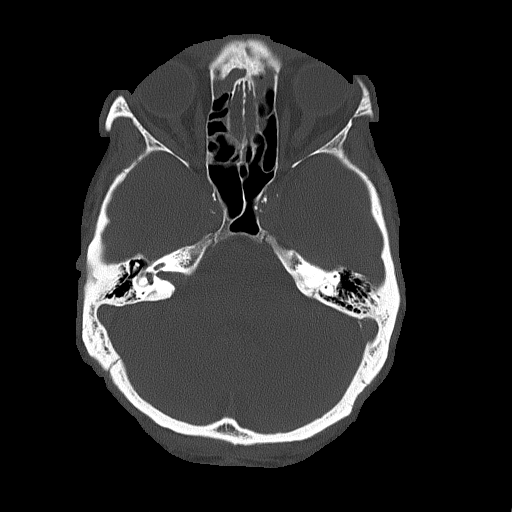
[im 25/82  bone]
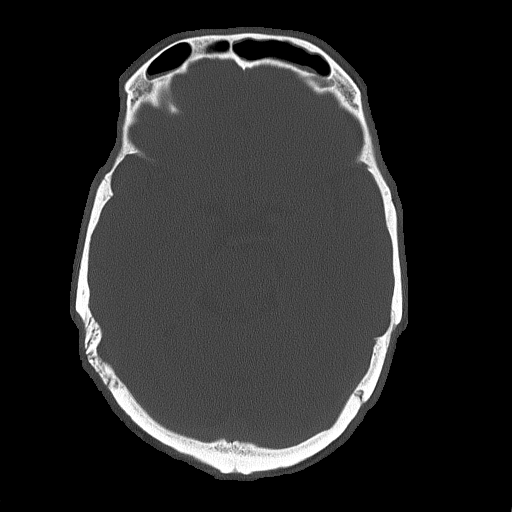

[Series 4: coronal soft · coronal · 0.33mm/px · 3 of 78 slices shown]
[im 26/78  brain]
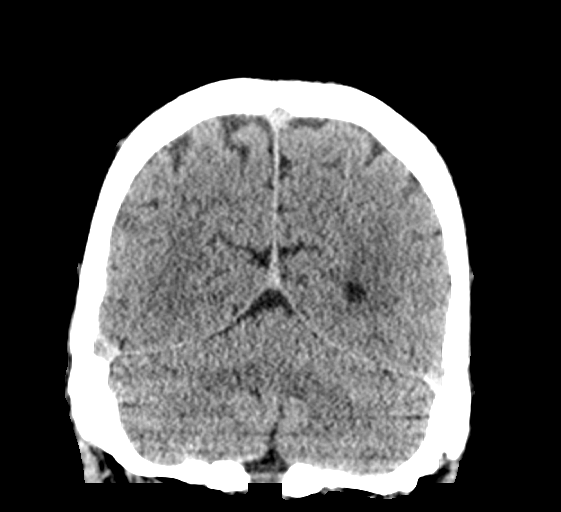
[im 35/78  brain]
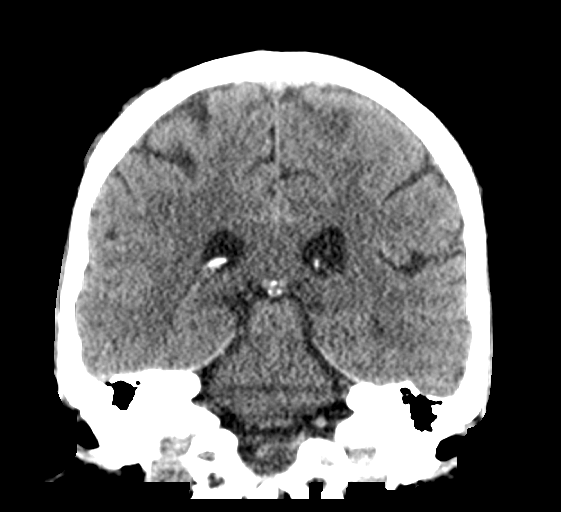
[im 43/78  brain]
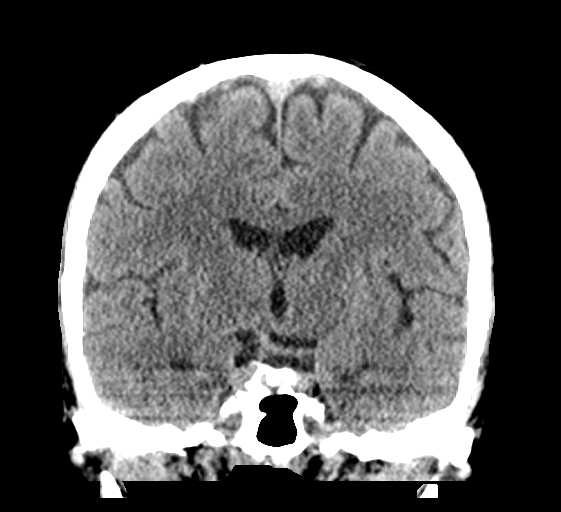

[Series 5: sagittal soft · sagittal · 0.34mm/px · 3 of 63 slices shown]
[im 21/63  brain]
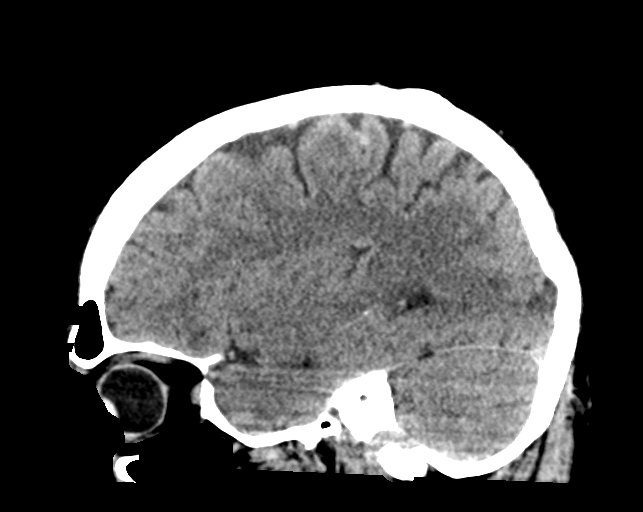
[im 32/63  brain]
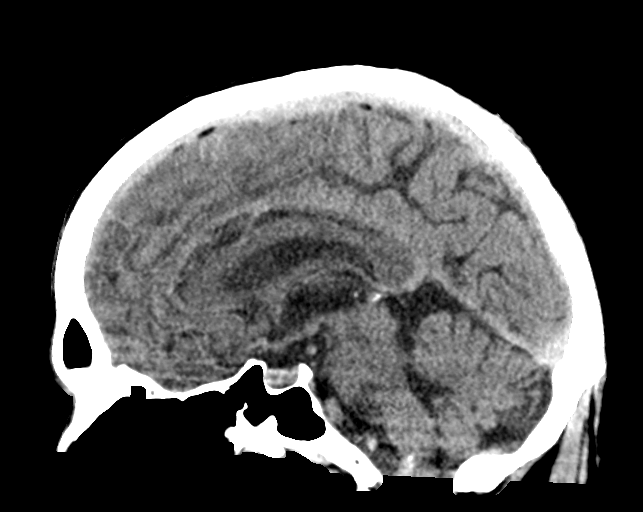
[im 42/63  brain]
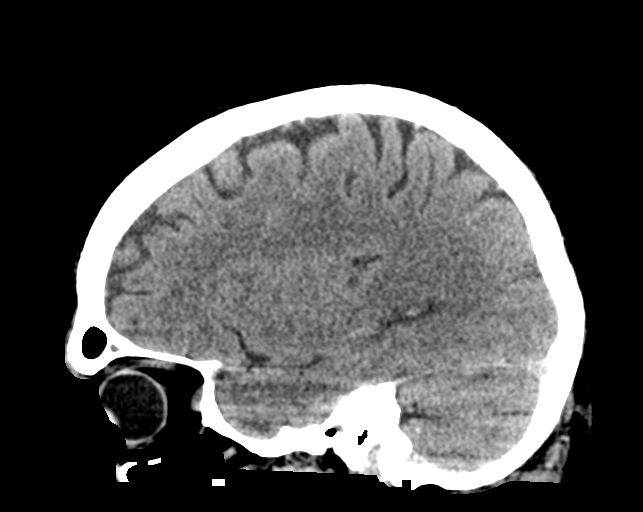

[16 of 47 positions shown; findings below may reference images not displayed]

FINDINGS: Brain: No evidence of acute infarction, hemorrhage, hydrocephalus,
extra-axial collection or mass lesion/mass effect.

Vascular: No hyperdense vessel or unexpected calcification.

Skull: Normal. Negative for fracture or focal lesion.

Sinuses/Orbits: No acute finding.

Other: None.
IMPRESSION: No acute intracranial abnormality seen.

## 2022-02-05 MED ORDER — ENOXAPARIN SODIUM 40 MG/0.4ML IJ SOSY
40.0000 mg | PREFILLED_SYRINGE | INTRAMUSCULAR | Status: DC
  Administered 2022-02-05: 40 mg via SUBCUTANEOUS
  Filled 2022-02-05: qty 0.4

## 2022-02-05 MED ORDER — SODIUM CHLORIDE 0.9 % IV SOLN: INTRAVENOUS | Status: DC

## 2022-02-05 MED ORDER — ACETAMINOPHEN 160 MG/5ML PO SOLN: 650.0000 mg | ORAL | Status: DC | PRN

## 2022-02-05 MED ORDER — ACETAMINOPHEN 650 MG RE SUPP: 650.0000 mg | RECTAL | Status: DC | PRN

## 2022-02-05 MED ORDER — ASPIRIN 325 MG PO TABS
325.0000 mg | ORAL_TABLET | Freq: Once | ORAL | Status: AC
Start: 2022-02-05 — End: 2022-02-05
  Administered 2022-02-05: 325 mg via ORAL
  Filled 2022-02-05: qty 1

## 2022-02-05 MED ORDER — CYCLOBENZAPRINE HCL 10 MG PO TABS
10.0000 mg | ORAL_TABLET | Freq: Every day | ORAL | Status: DC
  Administered 2022-02-05: 10 mg via ORAL
  Filled 2022-02-05: qty 1

## 2022-02-05 MED ORDER — ATORVASTATIN CALCIUM 40 MG PO TABS
40.0000 mg | ORAL_TABLET | Freq: Every day | ORAL | Status: DC
  Administered 2022-02-05 – 2022-02-06 (×2): 40 mg via ORAL
  Filled 2022-02-05 (×2): qty 1

## 2022-02-05 MED ORDER — CLOPIDOGREL BISULFATE 75 MG PO TABS
300.0000 mg | ORAL_TABLET | Freq: Once | ORAL | Status: AC
  Administered 2022-02-06: 300 mg via ORAL
  Filled 2022-02-05: qty 4

## 2022-02-05 MED ORDER — LABETALOL HCL 5 MG/ML IV SOLN: 10.0000 mg | INTRAVENOUS | Status: DC | PRN

## 2022-02-05 MED ORDER — STROKE: EARLY STAGES OF RECOVERY BOOK
Freq: Once | Status: AC
  Filled 2022-02-05: qty 1

## 2022-02-05 MED ORDER — IOHEXOL 350 MG/ML SOLN
100.0000 mL | Freq: Once | INTRAVENOUS | Status: AC | PRN
  Administered 2022-02-05: 100 mL via INTRAVENOUS

## 2022-02-05 MED ORDER — ACETAMINOPHEN 325 MG PO TABS
650.0000 mg | ORAL_TABLET | ORAL | Status: DC | PRN
  Administered 2022-02-05: 650 mg via ORAL
  Filled 2022-02-05: qty 2

## 2022-02-05 MED ORDER — SENNOSIDES-DOCUSATE SODIUM 8.6-50 MG PO TABS: 1.0000 | ORAL_TABLET | Freq: Every evening | ORAL | Status: DC | PRN

## 2022-02-05 MED ORDER — ASPIRIN EC 325 MG PO TBEC
325.0000 mg | DELAYED_RELEASE_TABLET | Freq: Every day | ORAL | Status: DC
  Administered 2022-02-06: 325 mg via ORAL
  Filled 2022-02-05: qty 1

## 2022-02-05 MED ORDER — CLOPIDOGREL BISULFATE 75 MG PO TABS
75.0000 mg | ORAL_TABLET | Freq: Every day | ORAL | Status: DC
Start: 2022-02-06 — End: 2022-02-06
  Administered 2022-02-06: 75 mg via ORAL
  Filled 2022-02-05: qty 1

## 2022-02-05 NOTE — ED Triage Notes (Signed)
Pt arrives as a transfer for [facility] via [facility] after having an episode of slurred speech starting around 9 this AM. This has totally resolved however patient is here for further neurological assessment. ?

## 2022-02-05 NOTE — ED Provider Notes (Signed)
56 yo male sent to this ER from [facility] for stroke work up, needing CTA head and neck for further evaluation of carotid lesion on MRI (CT down at [facility]).  ?States he had a crick in the right side of his neck for the past 2 days that he did not think much of at the time, also noted left arm numbness yesterday while in the shower.  Patient became concerned today when he was having difficulty forming his words.  Word formation has since resolved, does continue to have some pain on the right side of his neck. ?Physical Exam  ?BP (!) 154/83   Pulse (!) 59   Temp 98.3 ?F (36.8 ?C) (Oral)   Resp 15   Ht '5\' 10"'$  (1.778 m)   Wt 78.5 kg   SpO2 98%   BMI 24.82 kg/m?  ? ?Physical Exam ? ?Procedures  ?Procedures ? ?ED Course / MDM  ?  ?Medical Decision Making ?Amount and/or Complexity of Data Reviewed ?Labs: ordered. ?Radiology: ordered. ? ?Risk ?OTC drugs. ?Decision regarding hospitalization. ? ? ?Vascular surgery was consulted for evaluation pending further work-up and medical admission, discussed with Dr. Trula Slade (currently in the OR), PA from vascular team will see the patient. Neurology made aware of patient's plan for vascular eval and medical admission.  Discussed with Dr. Doristine Bosworth, with hospitalist service who will consult for admission. ? ? ? ? ?  ?Tacy Learn, PA-C ?02/05/22 2308 ? ?  ?Regan Lemming, MD ?02/06/22 0036 ? ?

## 2022-02-05 NOTE — ED Notes (Signed)
Pt returned from ct at this time. This RN contacted the floor and requested a purple man ?

## 2022-02-05 NOTE — ED Notes (Signed)
Pt ambulated to restroom without assistance at this time. Pt is upset that he has been here so long and has been told anything per him and that he is hungry and can't have anything to eat. Pt advised would page admit provider to discuss same with them ?

## 2022-02-05 NOTE — Consult Note (Addendum)
?                    NEURO HOSPITALIST CONSULT NOTE  ? ?Requestig physician: Dr. Armandina Gemma ? ?Reason for Consult: TIA symptoms x 2 of transient LUE numbness followed one day later by transient aphasia ? ?History obtained from:  Patient and Chart    ? ?HPI:                                                                                                                                         ? Clinton Ross is an 56 y.o. male who initially presented to the [facility] ED this AM for evaluation of TIA symptoms x 2, consisting of transient LUE numbness yesterday, followed by transient aphasia this morning. The first episode occurred while in the shower and lasted for about 3 minutes with numbness of the entire arm but no weakness or other neurological symptoms at that time. The second episode was witnessed by his wife during which he "could not get my words out", lasting about 4-5 minutes prior to spontaneous resolution. He also had lightheadedness and dizziness at the time, which made him unable to stand. He has a PMHx significant for arthritis, colon CA s/p resection and chemotherapy, HTN, PTSD, sciatica and PTSD. He also smokes a little under a pack per day and has smoked since about the age of 22.  ? ?He also endorses onset of decreased visual acuity described as "hazy" in his right eye starting about 3 weeks ago. He went to Ophthalmology about 1 week ago and had retinal imaging revealing evidence for poor blood flow to his right retina. HgbA1c drawn by the Ophthalmologist was normal, per patient. He has other blood work pending.  ? ?He takes ASA on a regular basis, but only about 3 times per week.  ? ?Past Medical History:  ?Diagnosis Date  ? Acute pancreatitis 10/2013  ? Arthritis   ? Bulging disc   ? Cancer [facility])   ? Colon  ? Chronic knee pain   ? Colostomy in place [facility])   ? Gallstones   ? Hypertension   ? PTSD (post-traumatic stress disorder)   ? Sciatica   ? ? ?Past Surgical History:  ?Procedure Laterality Date  ?  CHOLECYSTECTOMY    ? COLON SURGERY    ? rectal cancer Right   ? ? ?Family History  ?Problem Relation Age of Onset  ? Hypertension Mother   ? Stroke Father   ? Diabetes Other   ?          ? ?Social History:  reports that he has been smoking cigarettes. He has a 15.00 pack-year smoking history. He has never used smokeless tobacco. He reports that he does not drink alcohol and does not use drugs. ? ?Allergies  ?Allergen Reactions  ? Peanut-Containing Drug Products Itching  ?  Walnuts  ? Morphine And  Related   ?  Altered mental status-burning sensation all over  ? ? ?HOME MEDICATIONS:                                                                                                                     ? ?No current facility-administered medications on file prior to encounter.  ? ?Current Outpatient Medications on File Prior to Encounter  ?Medication Sig Dispense Refill  ? amLODipine (NORVASC) 5 MG tablet Take 1 tablet (5 mg total) by mouth daily. (Patient taking differently: Take 10 mg by mouth daily.) 30 tablet 0  ? aspirin 81 MG chewable tablet Chew 1 tablet (81 mg total) by mouth daily. (Patient taking differently: Chew 81 mg by mouth 3 (three) times a week.) 30 tablet 0  ? cetirizine (ZYRTEC) 10 MG tablet Take 10 mg by mouth daily.    ? cyclobenzaprine (FLEXERIL) 10 MG tablet Take 1 tablet (10 mg total) by mouth at bedtime. 15 tablet 0  ? fluticasone (FLONASE) 50 MCG/ACT nasal spray Place 1 spray into both nostrils 2 (two) times daily. 16 g 0  ? ibuprofen (ADVIL) 800 MG tablet Take 1 tablet (800 mg total) by mouth 3 (three) times daily. 21 tablet 0  ? nitroGLYCERIN (NITROSTAT) 0.4 MG SL tablet Place 1 tablet (0.4 mg total) under the tongue every 5 (five) minutes as needed for chest pain. 30 tablet 0  ? oxyCODONE-acetaminophen (PERCOCET/ROXICET) 5-325 MG tablet Take 1 tablet by mouth every 4 (four) hours as needed. 8 tablet 0  ? doxycycline (VIBRAMYCIN) 100 MG capsule Take 1 capsule (100 mg total) by mouth 2 (two) times  daily. (Patient not taking: Reported on 02/05/2022) 20 capsule 0  ? predniSONE (DELTASONE) 50 MG tablet Take 1 tablet (50 mg total) by mouth daily with breakfast. (Patient not taking: Reported on 02/05/2022) 5 tablet 0  ? ? ? ?ROS:                                                                                                                                       ?As per HPI. Comprehensive ROS otherwise negative.  ? ? ?Blood pressure (!) 134/104, pulse (!) 59, temperature 98.3 ?F (36.8 ?C), temperature source Oral, resp. rate 16, height '5\' 10"'$  (1.778 m), weight 78.5 kg, SpO2 96 %. ? ? ?General Examination:                                                                                                      ? ?  Physical Exam  ?HEENT-  [place]/AT    ?Lungs- Respirations unlabored ?Extremities- No edema ? ?Neurological Examination ?Mental Status: Awake and alert. Fully oriented. Speech fluent with intact naming and comprehension. Normal memory and insight.  ?Cranial Nerves: ?II: Visual fields intact bilaterally. No extinction to DSS.   ?III,IV, VI: No ptosis. EOMI. No nystagmus.  ?V: Temp sensation equal bilaterally ?VII: Smile symmetric ?VIII: Hearing intact to conversation ?IX,X: No hoarseness or hypophonia ?XI: Symmetric ?XII: Midline tongue extension ?Motor: ?Right : Upper extremity   5/5    Left:     Upper extremity   5/5 ? Lower extremity   5/5     Lower extremity   5/5 ?No pronator drift ?Sensory: Temp and light touch intact throughout, bilaterally ?Deep Tendon Reflexes: 1+ and symmetric throughout ?Cerebellar: No ataxia with FNF or H-S bilaterally ?Gait: Deferred ?  ?Lab Results: ?Basic Metabolic Panel: ?Recent Labs  ?Lab 02/05/22 ?1006  ?NA 138  ?K 3.8  ?CL 106  ?CO2 26  ?GLUCOSE 88  ?BUN 12  ?CREATININE 1.03  ?CALCIUM 9.1  ? ? ?CBC: ?Recent Labs  ?Lab 02/05/22 ?1006  ?WBC 7.5  ?NEUTROABS 5.8  ?HGB 16.5  ?HCT 49.6  ?MCV 87.2  ?PLT 215  ? ? ?Cardiac Enzymes: ?No results for input(s): CKTOTAL, CKMB, CKMBINDEX, TROPONINI  in the last 168 hours. ? ?Lipid Panel: ?No results for input(s): CHOL, TRIG, HDL, CHOLHDL, VLDL, LDLCALC in the last 168 hours. ? ?Imaging: ?CT Head Wo Contrast ? ?Result Date: 02/05/2022 ?CLINICAL DATA:  Slurred speech. EXAM: CT HEAD WITHOUT CONTRAST TECHNIQUE: Contiguous axial images were obtained from the base of the skull through the vertex without intravenous contrast. RADIATION DOSE REDUCTION: This exam was performed according to the departmental dose-optimization program which includes automated exposure control, adjustment of the mA and/or kV according to patient size and/or use of iterative reconstruction technique. COMPARISON:  None Available. FINDINGS: Brain: No evidence of acute infarction, hemorrhage, hydrocephalus, extra-axial collection or mass lesion/mass effect. Vascular: No hyperdense vessel or unexpected calcification. Skull: Normal. Negative for fracture or focal lesion. Sinuses/Orbits: No acute finding. Other: None. IMPRESSION: No acute intracranial abnormality seen. Electronically Signed   By: Marijo Conception M.D.   On: 02/05/2022 10:45  ? ?MR ANGIO HEAD WO CONTRAST ? ?Result Date: 02/05/2022 ?CLINICAL DATA:  Transient ischemic attack EXAM: MRI HEAD WITHOUT CONTRAST MRA HEAD WITHOUT CONTRAST TECHNIQUE: Multiplanar, multi-echo pulse sequences of the brain and surrounding structures were acquired without intravenous contrast. Angiographic images of the Circle of Willis were acquired using MRA technique without intravenous contrast. COMPARISON:  None Available. FINDINGS: MRI HEAD Brain: Small foci variably reduced diffusion are present in the right frontal much greater than parietal and occipital lobes. Ventricles and sulci are within normal limits in size and configuration. Minimal susceptibility in the right occipital lobe may reflect chronic blood products. No intracranial mass or mass effect. No hydrocephalus or extra-axial collection. Vascular: Major vessel flow voids at the skull base are  preserved. Skull and upper cervical spine: Normal marrow signal is preserved. Sinuses/Orbits: Paranasal sinuses are aerated. Orbits are unremarkable. Other: Sella is unremarkable.  Mastoid air cells are clear.

## 2022-02-05 NOTE — ED Notes (Signed)
Patient transported to MRI 

## 2022-02-05 NOTE — Consult Note (Signed)
? ?[facility] ? ?Patient name: Clinton Ross MRN: [id number] DOB: 1965/12/02 Sex: male ? ? ?REQUESTING PROVIDER:  ? ? ER ? ? ?REASON FOR CONSULT:  ?  ?Symptomatic right carotid stenosis ? ?HISTORY OF PRESENT ILLNESS:  ? ?Clinton Ross is a 56 y.o. male, who presented to the [facility] emergency department on 02/05/2022 with 2 episodes of left upper extremity numbness and aphasia.  The numbness happened yesterday and the aphasia was this morning.  The episodes lasted a few minutes.  They resolved.  MRI revealed acute and subacute infarcts in the right frontal lobe and parietal and occipital lobe.  There was concern over right carotid stenosis based on the MRA.  He was scheduled to get a CT angiogram but the machine at [facility] was broken and so he was sent to [facility].  Currently he is asymptomatic. ? ?The patient has a history of rectal cancer in 2017 treated with APR and permanent colostomy.  He is medically managed for hypertension.  He is a smoker.  He is on a statin and aspirin. ? ?PAST MEDICAL HISTORY  ? ? ?Past Medical History:  ?Diagnosis Date  ? Acute pancreatitis 10/2013  ? Arthritis   ? Bulging disc   ? Cancer [facility])   ? Colon  ? Chronic knee pain   ? Colostomy in place [facility])   ? Gallstones   ? Hypertension   ? PTSD (post-traumatic stress disorder)   ? Sciatica   ? ? ? ?FAMILY HISTORY  ? ?Family History  ?Problem Relation Age of Onset  ? Hypertension Mother   ? Stroke Father   ? Diabetes Other   ? ? ?SOCIAL HISTORY:  ? ?Social History  ? ?Socioeconomic History  ? Marital status: Married  ?  Spouse name: Not on file  ? Number of children: Not on file  ? Years of education: Not on file  ? Highest education level: Not on file  ?Occupational History  ? Not on file  ?Tobacco Use  ? Smoking status: Every Day  ?  Packs/day: 0.50  ?  Years: 30.00  ?  Pack years: 15.00  ?  Types: Cigarettes  ? Smokeless tobacco: Never  ?Vaping Use  ? Vaping Use: Never used   ?Substance and Sexual Activity  ? Alcohol use: No  ? Drug use: No  ? Sexual activity: Not on file  ?Other Topics Concern  ? Not on file  ?Social History Narrative  ? Not on file  ? ?Social Determinants of Health  ? ?Financial Resource Strain: Not on file  ?Food Insecurity: Not on file  ?Transportation Needs: Not on file  ?Physical Activity: Not on file  ?Stress: Not on file  ?Social Connections: Not on file  ?Intimate Partner Violence: Not on file  ? ? ?ALLERGIES:  ? ? ?Allergies  ?Allergen Reactions  ? Peanut-Containing Drug Products Itching  ?  Walnuts  ? Morphine And Related   ?  Altered mental status-burning sensation all over  ? ? ?CURRENT MEDICATIONS:  ? ? ?Current Facility-Administered Medications  ?Medication Dose Route Frequency Provider Last Rate Last Admin  ?  stroke: early stages of recovery book   Does not apply Once Darliss Cheney, MD      ? 0.9 %  sodium chloride infusion   Intravenous Continuous Darliss Cheney, MD 75 mL/hr at 02/05/22 1817 New Bag at 02/05/22 1817  ? acetaminophen (TYLENOL) tablet 650 mg  650 mg Oral Q4H PRN Darliss Cheney,  MD   650 mg at 02/05/22 2148  ? Or  ? acetaminophen (TYLENOL) 160 MG/5ML solution 650 mg  650 mg Per Tube Q4H PRN Darliss Cheney, MD      ? Or  ? acetaminophen (TYLENOL) suppository 650 mg  650 mg Rectal Q4H PRN Darliss Cheney, MD      ? atorvastatin (LIPITOR) tablet 40 mg  40 mg Oral Daily Darliss Cheney, MD   40 mg at 02/05/22 1813  ? cyclobenzaprine (FLEXERIL) tablet 10 mg  10 mg Oral QHS Darliss Cheney, MD   10 mg at 02/05/22 2148  ? enoxaparin (LOVENOX) injection 40 mg  40 mg Subcutaneous Q24H Darliss Cheney, MD   40 mg at 02/05/22 2149  ? labetalol (NORMODYNE) injection 10 mg  10 mg Intravenous Q2H PRN Darliss Cheney, MD      ? senna-docusate (Senokot-S) tablet 1 tablet  1 tablet Oral QHS PRN Darliss Cheney, MD      ? ?Current Outpatient Medications  ?Medication Sig Dispense Refill  ? amLODipine (NORVASC) 5 MG tablet Take 1 tablet (5 mg total) by mouth daily.  (Patient taking differently: Take 10 mg by mouth daily.) 30 tablet 0  ? aspirin 81 MG chewable tablet Chew 1 tablet (81 mg total) by mouth daily. (Patient taking differently: Chew 81 mg by mouth 3 (three) times a week.) 30 tablet 0  ? cetirizine (ZYRTEC) 10 MG tablet Take 10 mg by mouth daily.    ? cyclobenzaprine (FLEXERIL) 10 MG tablet Take 1 tablet (10 mg total) by mouth at bedtime. 15 tablet 0  ? fluticasone (FLONASE) 50 MCG/ACT nasal spray Place 1 spray into both nostrils 2 (two) times daily. 16 g 0  ? ibuprofen (ADVIL) 800 MG tablet Take 1 tablet (800 mg total) by mouth 3 (three) times daily. 21 tablet 0  ? nitroGLYCERIN (NITROSTAT) 0.4 MG SL tablet Place 1 tablet (0.4 mg total) under the tongue every 5 (five) minutes as needed for chest pain. 30 tablet 0  ? oxyCODONE-acetaminophen (PERCOCET/ROXICET) 5-325 MG tablet Take 1 tablet by mouth every 4 (four) hours as needed. 8 tablet 0  ? doxycycline (VIBRAMYCIN) 100 MG capsule Take 1 capsule (100 mg total) by mouth 2 (two) times daily. (Patient not taking: Reported on 02/05/2022) 20 capsule 0  ? predniSONE (DELTASONE) 50 MG tablet Take 1 tablet (50 mg total) by mouth daily with breakfast. (Patient not taking: Reported on 02/05/2022) 5 tablet 0  ? ? ?REVIEW OF SYSTEMS:  ? ?'[X]'$  denotes positive finding, '[ ]'$  denotes negative finding ?Cardiac  Comments:  ?Chest pain or chest pressure:    ?Shortness of breath upon exertion:    ?Short of breath when lying flat:    ?Irregular heart rhythm:    ?    ?Vascular    ?Pain in calf, thigh, or hip brought on by ambulation:    ?Pain in feet at night that wakes you up from your sleep:     ?Blood clot in your veins:    ?Leg swelling:     ?    ?Pulmonary    ?Oxygen at home:    ?Productive cough:     ?Wheezing:     ?    ?Neurologic    ?Sudden weakness in arms or legs:  x   ?Sudden numbness in arms or legs:  x   ?Sudden onset of difficulty speaking or slurred speech: x   ?Temporary loss of vision in one eye:     ?Problems with dizziness:      ?    ?  Gastrointestinal    ?Blood in stool:     ? ?Vomited blood:     ?    ?Genitourinary    ?Burning when urinating:     ?Blood in urine:    ?    ?Psychiatric    ?Major depression:     ?    ?Hematologic    ?Bleeding problems:    ?Problems with blood clotting too easily:    ?    ?Skin    ?Rashes or ulcers:    ?    ?Constitutional    ?Fever or chills:    ? ?PHYSICAL EXAM:  ? ?Vitals:  ? 02/05/22 2000 02/05/22 2027 02/05/22 2130 02/05/22 2300  ?BP: (!) 142/104 (!) 154/90 (!) 154/83 109/89  ?Pulse: (!) 50 (!) 59 (!) 59 65  ?Resp: (!) '7 18 15 12  '$ ?Temp:      ?TempSrc:      ?SpO2: 94% 96% 98% 95%  ?Weight:      ?Height:      ? ? ?GENERAL: The patient is a well-nourished male, in no acute distress. The vital signs are documented above. ?CARDIAC: There is a regular rate and rhythm.  ?PULMONARY: Nonlabored respirations ?MUSCULOSKELETAL: There are no major deformities or cyanosis. ?NEUROLOGIC: No focal weakness or paresthesias are detected. ?SKIN: There are no ulcers or rashes noted. ?PSYCHIATRIC: The patient has a normal affect. ? ?STUDIES:  ? ?I have reviewed his CT scan with the following findings: ?1. Complete occlusion of the right internal carotid artery along its ?entire course. ?2. There is 104 mL area of ischemic penumbra within the right MCA ?territory. No core infarct by perfusion criteria. ?3. No intracranial arterial occlusion or high-grade stenosis. ? ?ASSESSMENT and PLAN  ? ?Right carotid occlusion: This is the etiology for his stroke.  Because the artery is completely occluded, no surgical reconstruction is indicated.  He will need medical management to include high-dose statin therapy, dual antiplatelet therapy with aspirin and Plavix, and smoking cessation.  Stroke service should follow-up with him in the morning.  ? ? ?Annamarie Major, IV, MD, FACS ?[facility] [place] ?Tel [phone] ?Pager [phone]  ?

## 2022-02-05 NOTE — ED Provider Notes (Signed)
?[facility] ?Provider Note ? ? ?CSN: [id number] ?Arrival date & time: 02/05/22  7209 ? ?  ? ?History ? ?Chief Complaint  ?Patient presents with  ? Neurologic Problem  ? ? ?Clinton Ross is a 56 y.o. male. ? ?HPI ? ?With medical history including hypertension, adenocarcinoma of the rectum, chronic back pain presents with complaints of slurred speech.  He states that he was sitting on his porch and around 920 he felt as if he cannot speak, he states his tongue felt weird and could not get the words out, he states he felt lightheaded and dizziness and could not stand due to that.  He denies any unilateral weakness, change in vision, paresthesias in the upper or lower extremities.  He states his last approximately 4 minutes and resolve on its own.  He states he feels at his baseline, he has no complaints, he has no history of atrial fib, no history of CVA or internal head bleed, states that he is a smoker, and is being treated for hypertension but no history of diabetes.  He has no other complaints. ? ?Home Medications ?Prior to Admission medications   ?Medication Sig Start Date End Date Taking? Authorizing Provider  ?amLODipine (NORVASC) 5 MG tablet Take 1 tablet (5 mg total) by mouth daily. ?Patient taking differently: Take 10 mg by mouth daily. 06/11/18  Yes Kathie Dike, MD  ?aspirin 81 MG chewable tablet Chew 1 tablet (81 mg total) by mouth daily. ?Patient taking differently: Chew 81 mg by mouth 3 (three) times a week. 02/23/18  Yes Julianne Rice, MD  ?cetirizine (ZYRTEC) 10 MG tablet Take 10 mg by mouth daily.   Yes [provider]  ?cyclobenzaprine (FLEXERIL) 10 MG tablet Take 1 tablet (10 mg total) by mouth at bedtime. 03/23/20  Yes Wurst, [place], PA-C  ?fluticasone (FLONASE) 50 MCG/ACT nasal spray Place 1 spray into both nostrils 2 (two) times daily. 10/05/21  Yes Volney American, PA-C  ?ibuprofen (ADVIL) 800 MG tablet Take 1 tablet (800 mg total) by mouth 3 (three) times  daily. 10/14/20  Yes Triplett, Tammy, PA-C  ?nitroGLYCERIN (NITROSTAT) 0.4 MG SL tablet Place 1 tablet (0.4 mg total) under the tongue every 5 (five) minutes as needed for chest pain. 02/23/18  Yes Julianne Rice, MD  ?oxyCODONE-acetaminophen (PERCOCET/ROXICET) 5-325 MG tablet Take 1 tablet by mouth every 4 (four) hours as needed. 10/14/20  Yes Triplett, Tammy, PA-C  ?doxycycline (VIBRAMYCIN) 100 MG capsule Take 1 capsule (100 mg total) by mouth 2 (two) times daily. ?Patient not taking: Reported on 02/05/2022 03/23/20   Stacey Drain [place], PA-C  ?predniSONE (DELTASONE) 50 MG tablet Take 1 tablet (50 mg total) by mouth daily with breakfast. ?Patient not taking: Reported on 02/05/2022 12/31/21   Jaynee Eagles, PA-C  ?   ? ?Allergies    ?Peanut-containing drug products and Morphine and related   ? ?Review of Systems   ?Review of Systems  ?Constitutional:  Negative for chills and fever.  ?Respiratory:  Negative for shortness of breath.   ?Cardiovascular:  Negative for chest pain.  ?Gastrointestinal:  Negative for abdominal pain.  ?Neurological:  Negative for headaches.  ? ?Physical Exam ?Updated Vital Signs ?BP 124/87   Pulse 68   Temp 98.1 ?F (36.7 ?C)   Resp 15   Ht '5\' 10"'$  (1.778 m)   Wt 78.5 kg   SpO2 100%   BMI 24.82 kg/m?  ?Physical Exam ?Vitals and nursing note reviewed.  ?Constitutional:   ?   General:  He is not in acute distress. ?   Appearance: He is not ill-appearing.  ?HENT:  ?   Head: Normocephalic and atraumatic.  ?   Nose: No congestion.  ?Eyes:  ?   Extraocular Movements: Extraocular movements intact.  ?   Conjunctiva/sclera: Conjunctivae normal.  ?   Pupils: Pupils are equal, round, and reactive to light.  ?Cardiovascular:  ?   Rate and Rhythm: Normal rate and regular rhythm.  ?   Pulses: Normal pulses.  ?   Heart sounds: No murmur heard. ?  No friction rub. No gallop.  ?Pulmonary:  ?   Effort: No respiratory distress.  ?   Breath sounds: No wheezing, rhonchi or rales.  ?Skin: ?   General: Skin is warm and  dry.  ?Neurological:  ?   Mental Status: He is alert.  ?   GCS: GCS eye subscore is 4. GCS verbal subscore is 5. GCS motor subscore is 6.  ?   Cranial Nerves: No cranial nerve deficit.  ?   Sensory: Sensation is intact.  ?   Motor: Motor function is intact. No weakness.  ?   Coordination: Coordination is intact. Romberg sign negative. Finger-Nose-Finger Test and Heel to Everett Test normal.  ?   Comments: Cranial nerves II through XII grossly intact no difficulty word finding following two-step commands no unilateral weakness present.  ?Psychiatric:     ?   Mood and Affect: Mood normal.  ? ? ?ED Results / Procedures / Treatments   ?Labs ?(all labs ordered are listed, but only abnormal results are displayed) ?Labs Reviewed  ?RAPID URINE DRUG SCREEN, HOSP PERFORMED - Abnormal; Notable for the following components:  ?    Result Value  ? Tetrahydrocannabinol POSITIVE (*)   ? All other components within normal limits  ?CBG MONITORING, ED - Abnormal; Notable for the following components:  ? Glucose-Capillary 106 (*)   ? All other components within normal limits  ?BASIC METABOLIC PANEL  ?CBC WITH DIFFERENTIAL/PLATELET  ?PROTIME-INR  ? ? ?EKG ?EKG Interpretation ? ?Date/Time:  Tuesday Feb 05 2022 10:08:29 EDT ?Ventricular Rate:  64 ?PR Interval:  183 ?QRS Duration: 87 ?QT Interval:  384 ?QTC Calculation: 397 ?R Axis:   15 ?Text Interpretation: Sinus rhythm Minimal ST elevation, inferior leads Since last tracing subtle inferior ST elevation. Otherwise no significant change Confirmed by Daleen Bo [phone]) on 02/05/2022 1:56:07 PM ? ?Radiology ?CT Head Wo Contrast ? ?Result Date: 02/05/2022 ?CLINICAL DATA:  Slurred speech. EXAM: CT HEAD WITHOUT CONTRAST TECHNIQUE: Contiguous axial images were obtained from the base of the skull through the vertex without intravenous contrast. RADIATION DOSE REDUCTION: This exam was performed according to the departmental dose-optimization program which includes automated exposure control,  adjustment of the mA and/or kV according to patient size and/or use of iterative reconstruction technique. COMPARISON:  None Available. FINDINGS: Brain: No evidence of acute infarction, hemorrhage, hydrocephalus, extra-axial collection or mass lesion/mass effect. Vascular: No hyperdense vessel or unexpected calcification. Skull: Normal. Negative for fracture or focal lesion. Sinuses/Orbits: No acute finding. Other: None. IMPRESSION: No acute intracranial abnormality seen. Electronically Signed   By: Marijo Conception M.D.   On: 02/05/2022 10:45  ? ?MR ANGIO HEAD WO CONTRAST ? ?Result Date: 02/05/2022 ?CLINICAL DATA:  Transient ischemic attack EXAM: MRI HEAD WITHOUT CONTRAST MRA HEAD WITHOUT CONTRAST TECHNIQUE: Multiplanar, multi-echo pulse sequences of the brain and surrounding structures were acquired without intravenous contrast. Angiographic images of the Circle of Willis were acquired using MRA technique without intravenous  contrast. COMPARISON:  None Available. FINDINGS: MRI HEAD Brain: Small foci variably reduced diffusion are present in the right frontal much greater than parietal and occipital lobes. Ventricles and sulci are within normal limits in size and configuration. Minimal susceptibility in the right occipital lobe may reflect chronic blood products. No intracranial mass or mass effect. No hydrocephalus or extra-axial collection. Vascular: Major vessel flow voids at the skull base are preserved. Skull and upper cervical spine: Normal marrow signal is preserved. Sinuses/Orbits: Paranasal sinuses are aerated. Orbits are unremarkable. Other: Sella is unremarkable.  Mastoid air cells are clear. MRA HEAD Motion artifact is present. There is minimal flow related enhancement of the intracranial right internal carotid artery. Partial reconstitution near the terminus. Left intracranial internal carotid is patent with atherosclerotic irregularity. Anterior and middle cerebral arteries are patent. Flow related  enhancement appears relatively diminished in the right MCA territory. Anterior communicating artery appears to be present. Included intracranial vertebral arteries are patent. Basilar artery is patent. Posterior cerebral

## 2022-02-05 NOTE — H&P (Addendum)
?History and Physical  ? ? ?Clinton Ross MGQ:[id number] DOB: 1966/05/29 DOA: 02/05/2022 ? ?PCP: [facility]  ?Patient coming from: [facility] ? ?I have personally briefly reviewed patient's old medical records in [place] ? ?Chief Complaint: Slurred speech ? ?HPI: Clinton Ross is a 56 y.o. male with medical history significant of hypertension, adenocarcinoma of the rectum, chronic back pain initially presented to [facility] ED with complaint of slurred speech.  Patient had CT head followed by MRI brain and MR angiogram of the head and was found to have acute and subacute infarction in the right frontal lobe and to parietal and occipital lobes.  Possible high-grade stenosis of proximal right internal carotid artery.  Case was discussed with on-call neurology who recommended to do CT angiogram of head and neck.  Unfortunately, the machine at [facility] was broken so patient was transferred to [facility] emergency department to get those studies and to be evaluated by neurology.  According to patient, he initially started having right-sided neck pain about 3 days ago which has been intermittent and then yesterday sometime he had numbness of his left arm which lasted for 1 to 2 hours and then this morning at around 9 AM, he had slurred speech which lasted 4 to 5 minutes.  The constellation of symptoms made his wife very so she brought him to the emergency department for stroke work-up.  Patient's symptoms had resolved when prior to coming to the [facility] ED. no other complaints.  Patient is a smoker but does not have any history of atrial fibrillation.  During my encounter, patient still appears to have slightly slurred speech but he keeps telling me that this is his normal speech. ? ?ED Course: Here at [facility], upon arrival, patient is hemodynamically stable except that his recent blood pressure is 134/104.  He has no symptoms.  Per ED physician, neurology as well as vascular surgery has been consulted and patient is  pending evaluation. ? ?Review of Systems: As per HPI otherwise negative.  ? ? ?Past Medical History:  ?Diagnosis Date  ? Acute pancreatitis 10/2013  ? Arthritis   ? Bulging disc   ? Cancer [facility])   ? Colon  ? Chronic knee pain   ? Colostomy in place [facility])   ? Gallstones   ? Hypertension   ? PTSD (post-traumatic stress disorder)   ? Sciatica   ? ? ?Past Surgical History:  ?Procedure Laterality Date  ? CHOLECYSTECTOMY    ? COLON SURGERY    ? rectal cancer Right   ? ? ? reports that he has been smoking cigarettes. He has a 15.00 pack-year smoking history. He has never used smokeless tobacco. He reports that he does not drink alcohol and does not use drugs. ? ?Allergies  ?Allergen Reactions  ? Peanut-Containing Drug Products Itching  ?  Walnuts  ? Morphine And Related   ?  Altered mental status-burning sensation all over  ? ? ?Family History  ?Problem Relation Age of Onset  ? Hypertension Mother   ? Stroke Father   ? Diabetes Other   ? ? ?Prior to Admission medications   ?Medication Sig Start Date End Date Taking? Authorizing Provider  ?amLODipine (NORVASC) 5 MG tablet Take 1 tablet (5 mg total) by mouth daily. ?Patient taking differently: Take 10 mg by mouth daily. 06/11/18  Yes Kathie Dike, MD  ?aspirin 81 MG chewable tablet Chew 1 tablet (81 mg total) by mouth daily. ?Patient taking differently: Chew 81  mg by mouth 3 (three) times a week. 02/23/18  Yes Julianne Rice, MD  ?cetirizine (ZYRTEC) 10 MG tablet Take 10 mg by mouth daily.   Yes [provider]  ?cyclobenzaprine (FLEXERIL) 10 MG tablet Take 1 tablet (10 mg total) by mouth at bedtime. 03/23/20  Yes Wurst, [place], PA-C  ?fluticasone (FLONASE) 50 MCG/ACT nasal spray Place 1 spray into both nostrils 2 (two) times daily. 10/05/21  Yes Volney American, PA-C  ?ibuprofen (ADVIL) 800 MG tablet Take 1 tablet (800 mg total) by mouth 3 (three) times daily. 10/14/20  Yes Triplett, Tammy, PA-C  ?nitroGLYCERIN (NITROSTAT) 0.4 MG SL tablet Place 1 tablet (0.4  mg total) under the tongue every 5 (five) minutes as needed for chest pain. 02/23/18  Yes Julianne Rice, MD  ?oxyCODONE-acetaminophen (PERCOCET/ROXICET) 5-325 MG tablet Take 1 tablet by mouth every 4 (four) hours as needed. 10/14/20  Yes Triplett, Tammy, PA-C  ?doxycycline (VIBRAMYCIN) 100 MG capsule Take 1 capsule (100 mg total) by mouth 2 (two) times daily. ?Patient not taking: Reported on 02/05/2022 03/23/20   Stacey Drain [place], PA-C  ?predniSONE (DELTASONE) 50 MG tablet Take 1 tablet (50 mg total) by mouth daily with breakfast. ?Patient not taking: Reported on 02/05/2022 12/31/21   Jaynee Eagles, PA-C  ? ? ?Physical Exam: ?Vitals:  ? 02/05/22 1545 02/05/22 1600 02/05/22 1615 02/05/22 1630  ?BP: 119/63 113/84 (!) 141/85 (!) 134/104  ?Pulse: 61 63 62 (!) 59  ?Resp: '14 14 16 16  '$ ?Temp:      ?TempSrc:      ?SpO2: 98% 98% 95% 96%  ?Weight:      ?Height:      ? ? ?Constitutional: NAD, calm, comfortable ?Vitals:  ? 02/05/22 1545 02/05/22 1600 02/05/22 1615 02/05/22 1630  ?BP: 119/63 113/84 (!) 141/85 (!) 134/104  ?Pulse: 61 63 62 (!) 59  ?Resp: '14 14 16 16  '$ ?Temp:      ?TempSrc:      ?SpO2: 98% 98% 95% 96%  ?Weight:      ?Height:      ? ?Eyes: PERRL, lids and conjunctivae normal ?ENMT: Mucous membranes are moist. Posterior pharynx clear of any exudate or lesions.Normal dentition.  ?Neck: normal, supple, no masses, no thyromegaly ?Respiratory: clear to auscultation bilaterally, no wheezing, no crackles. Normal respiratory effort. No accessory muscle use.  ?Cardiovascular: Regular rate and rhythm, no murmurs / rubs / gallops. No extremity edema. 2+ pedal pulses. No carotid bruits.  ?Abdomen: no tenderness, no masses palpated. No hepatosplenomegaly. Bowel sounds positive.  ?Musculoskeletal: no clubbing / cyanosis. No joint deformity upper and lower extremities. Good ROM, no contractures. Normal muscle tone.  ?Skin: no rashes, lesions, ulcers. No induration ?Neurologic: CN 2-12 grossly intact. Sensation intact, DTR normal. Strength  5/5 in all 4.  Slight left facial droop. ?Psychiatric: Normal judgment and insight. Alert and oriented x 3. Normal mood.  ? ? ?Labs on Admission: I have personally reviewed following labs and imaging studies ? ?CBC: ?Recent Labs  ?Lab 02/05/22 ?1006  ?WBC 7.5  ?NEUTROABS 5.8  ?HGB 16.5  ?HCT 49.6  ?MCV 87.2  ?PLT 215  ? ?Basic Metabolic Panel: ?Recent Labs  ?Lab 02/05/22 ?1006  ?NA 138  ?K 3.8  ?CL 106  ?CO2 26  ?GLUCOSE 88  ?BUN 12  ?CREATININE 1.03  ?CALCIUM 9.1  ? ?GFR: ?Estimated Creatinine Clearance: 81.7 mL/min (by C-G formula based on SCr of 1.03 mg/dL). ?Liver Function Tests: ?No results for input(s): AST, ALT, ALKPHOS, BILITOT, PROT, ALBUMIN in the last  168 hours. ?No results for input(s): LIPASE, AMYLASE in the last 168 hours. ?No results for input(s): AMMONIA in the last 168 hours. ?Coagulation Profile: ?Recent Labs  ?Lab 02/05/22 ?1006  ?INR 1.0  ? ?Cardiac Enzymes: ?No results for input(s): CKTOTAL, CKMB, CKMBINDEX, TROPONINI in the last 168 hours. ?BNP (last 3 results) ?No results for input(s): PROBNP in the last 8760 hours. ?HbA1C: ?No results for input(s): HGBA1C in the last 72 hours. ?CBG: ?Recent Labs  ?Lab 02/05/22 ?4944  ?GLUCAP 106*  ? ?Lipid Profile: ?No results for input(s): CHOL, HDL, LDLCALC, TRIG, CHOLHDL, LDLDIRECT in the last 72 hours. ?Thyroid Function Tests: ?No results for input(s): TSH, T4TOTAL, FREET4, T3FREE, THYROIDAB in the last 72 hours. ?Anemia Panel: ?No results for input(s): VITAMINB12, FOLATE, FERRITIN, TIBC, IRON, RETICCTPCT in the last 72 hours. ?Urine analysis: ?   ?Component Value Date/Time  ? COLORURINE YELLOW 10/16/2021 1047  ? APPEARANCEUR CLEAR 10/16/2021 1047  ? LABSPEC 1.016 10/16/2021 1047  ? PHURINE 6.0 10/16/2021 1047  ? GLUCOSEU NEGATIVE 10/16/2021 1047  ? HGBUR MODERATE (A) 10/16/2021 1047  ? [place] NEGATIVE 10/16/2021 1047  ? BILIRUBINUR small (A) 10/11/2021 1642  ? [place] NEGATIVE 10/16/2021 1047  ? PROTEINUR NEGATIVE 10/16/2021 1047  ? UROBILINOGEN  1.0 10/11/2021 1642  ? UROBILINOGEN 0.2 12/04/2013 1635  ? NITRITE NEGATIVE 10/16/2021 1047  ? LEUKOCYTESUR NEGATIVE 10/16/2021 1047  ? ? ?Radiological Exams on Admission: ?CT Head Wo Contrast ? ?Result Da

## 2022-02-05 NOTE — ED Notes (Signed)
Pt provided something to eat and drink at this time ?

## 2022-02-05 NOTE — ED Notes (Signed)
Had secretary page the admit provider ?

## 2022-02-05 NOTE — ED Triage Notes (Signed)
Patient states that he developed slurred speech 30 minutes PTA, it lasted approx. 4-5 minutes. States that right arm started shaking at the same time. Speech has returned to normal at this time. Denies any specific deficits other that bilateral leg weakness since yesterday. MD made aware of patient symptoms.  ?

## 2022-02-05 NOTE — ED Notes (Signed)
Pt in ct at this time ° °

## 2022-02-05 NOTE — ED Notes (Addendum)
Received verbal report from [place] at this time ?

## 2022-02-05 NOTE — ED Notes (Signed)
Pt given water and crackers for PO challenge

## 2022-02-05 NOTE — Progress Notes (Signed)
Patient arrived to room 3w36 from ED.  Assessment complete, VS obtained, and Admission database began.  ?

## 2022-02-05 NOTE — ED Notes (Signed)
Patient transported to CT 

## 2022-02-06 ENCOUNTER — Observation Stay (HOSPITAL_BASED_OUTPATIENT_CLINIC_OR_DEPARTMENT_OTHER): Payer: No Typology Code available for payment source

## 2022-02-06 DIAGNOSIS — I6389 Other cerebral infarction: Secondary | ICD-10-CM

## 2022-02-06 DIAGNOSIS — F17200 Nicotine dependence, unspecified, uncomplicated: Secondary | ICD-10-CM | POA: Diagnosis not present

## 2022-02-06 DIAGNOSIS — I6521 Occlusion and stenosis of right carotid artery: Secondary | ICD-10-CM | POA: Diagnosis not present

## 2022-02-06 DIAGNOSIS — E7800 Pure hypercholesterolemia, unspecified: Secondary | ICD-10-CM | POA: Diagnosis not present

## 2022-02-06 DIAGNOSIS — I639 Cerebral infarction, unspecified: Secondary | ICD-10-CM | POA: Diagnosis not present

## 2022-02-06 LAB — ECHOCARDIOGRAM COMPLETE
Area-P 1/2: 3.72 cm2
Calc EF: 54.1 %
Height: 70 in
S' Lateral: 2.7 cm
Single Plane A2C EF: 50.1 %
Single Plane A4C EF: 58.8 %
Weight: 2768 oz

## 2022-02-06 LAB — LIPID PANEL
Cholesterol: 152 mg/dL (ref 0–200)
HDL: 23 mg/dL — ABNORMAL LOW (ref >40)
LDL Cholesterol: 109 mg/dL — ABNORMAL HIGH (ref 0–99)
Total CHOL/HDL Ratio: 6.6 RATIO
Triglycerides: 98 mg/dL (ref <150)
VLDL: 20 mg/dL (ref 0–40)

## 2022-02-06 MED ORDER — STUDY - OCEANIC-STROKE - ASUNDEXIAN 50 MG OR PLACEBO TABLET (PI-SETHI)
1.0000 | ORAL_TABLET | Freq: Every day | ORAL | Status: DC
Start: 2022-02-07 — End: 2022-02-06

## 2022-02-06 MED ORDER — ASPIRIN EC 81 MG PO TBEC: 81.0000 mg | DELAYED_RELEASE_TABLET | Freq: Every day | ORAL | Status: DC

## 2022-02-06 MED ORDER — ATORVASTATIN CALCIUM 40 MG PO TABS: 40.0000 mg | ORAL_TABLET | Freq: Every day | ORAL | 0 refills | Status: AC

## 2022-02-06 MED ORDER — STUDY - OCEANIC-STROKE - ASUNDEXIAN 50 MG OR PLACEBO TABLET (PI-SETHI): 1.0000 | ORAL_TABLET | Freq: Every day | ORAL | 0 refills | Status: DC

## 2022-02-06 MED ORDER — STUDY - OCEANIC-STROKE - ASUNDEXIAN 50 MG OR PLACEBO TABLET (PI-SETHI)
1.0000 | ORAL_TABLET | Freq: Every day | ORAL | Status: DC
  Administered 2022-02-06: 50 mg via ORAL
  Filled 2022-02-06: qty 1

## 2022-02-06 MED ORDER — CLOPIDOGREL BISULFATE 75 MG PO TABS: 75.0000 mg | ORAL_TABLET | Freq: Every day | ORAL | 0 refills | Status: AC

## 2022-02-06 MED ORDER — AMLODIPINE BESYLATE 5 MG PO TABS
5.0000 mg | ORAL_TABLET | Freq: Every day | ORAL | 0 refills | Status: AC
Start: 2022-02-06 — End: ?

## 2022-02-06 NOTE — Progress Notes (Signed)
? ? ?  Subjective  -  ? ?No complaints today.  He did work with physical therapy and had some balance issues.  He has decided to enroll in the stroke clinical trial ? ? ?Physical Exam: ? ?Cardiovascular regular rate and rhythm ?Neuro: Grossly intact ? ? ? ? ? ? ?Assessment/Plan:   ? ?Right carotid occlusion:  No surgical indication.  Continue with current medical regimen.  All questions answered ? ?Annamarie Major ?02/06/2022 ?3:29 PM ?-- ? ?Vitals:  ? 02/06/22 0150 02/06/22 1500  ?BP: 117/74 (!) 145/70  ?Pulse:  69  ?Resp: 13 16  ?Temp:  98.3 ?F (36.8 ?C)  ?SpO2: 96% 100%  ? ? ?Intake/Output Summary (Last 24 hours) at 02/06/2022 1529 ?Last data filed at 02/06/2022 8466 ?Gross per 24 hour  ?Intake 952.64 ml  ?Output 600 ml  ?Net 352.64 ml  ? ? ? ?Laboratory ?CBC ?   ?Component Value Date/Time  ? WBC 6.6 02/05/2022 1822  ? HGB 15.8 02/05/2022 1822  ? HGB 17.0 10/11/2021 1651  ? HCT 46.2 02/05/2022 1822  ? HCT 49.6 10/11/2021 1651  ? PLT 204 02/05/2022 1822  ? PLT 177 10/11/2021 1651  ? ? ?BMET ?   ?Component Value Date/Time  ? NA 138 02/05/2022 1006  ? NA 141 10/11/2021 1651  ? K 3.8 02/05/2022 1006  ? CL 106 02/05/2022 1006  ? CO2 26 02/05/2022 1006  ? GLUCOSE 88 02/05/2022 1006  ? BUN 12 02/05/2022 1006  ? BUN 14 10/11/2021 1651  ? CREATININE 1.03 02/05/2022 1006  ? CALCIUM 9.1 02/05/2022 1006  ? GFRNONAA >60 02/05/2022 1006  ? GFRAA >60 11/28/2019 0933  ? ? ?COAG ?Lab Results  ?Component Value Date  ? INR 1.0 02/05/2022  ? INR 1.07 11/21/2013  ? ?No results found for: PTT ? ?Antibiotics ?Anti-infectives (From admission, onward)  ? ? None  ? ?  ? ? ? ?V. Leia Alf, M.D., FACS ?[facility] ?Office: [phone] ?Pager:  [phone]  ?

## 2022-02-06 NOTE — Evaluation (Signed)
Physical Therapy Evaluation ?Patient Details ?Name: Clinton Ross ?MRN: [id number] ?DOB: [birth year] ?Today's Date: 02/06/2022 ? ?History of Present Illness ? Pt is a 56yo male presenting to ED with L UE numbness and slurred speech. 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 PMH: HTN, adenomcarcinoma of rectum s/p colostomy, chronic back pain ?  ?Clinical Impression ? Pt admitted with above. Pt with noted mild R UE weakness, generalized weakness in LEs as noted during ambulation when pt having episodes of not being able to clear feet and tripping self. Noted pt with completely occluded R ICA. D\uring ambulation when pt asked to turned head to the L pt experience immediate onset of dizziness requiring minA from PT to prevent fall. Pt also reports when turning head to the R he begins to feel like he's going to pass out.  Suspect this is related to the occluded R ICA. Pt reports "I feel okay but I know i'm not back to my normal self." Recommend outpt PT to address generalized weakness and impaired balance to achieve safe indep function. Acute PT to cont to follow. ?   ? ?Recommendations for follow up therapy are one component of a multi-disciplinary discharge planning process, led by the attending physician.  Recommendations may be updated based on patient status, additional functional criteria and insurance authorization. ? ?Follow Up Recommendations Outpatient PT ? ?  ?Assistance Recommended at Discharge Intermittent Supervision/Assistance  ?Patient can return home with the following ? A little help with walking and/or transfers;A little help with bathing/dressing/bathroom;Assist for transportation;Help with stairs or ramp for entrance ? ?  ?Equipment Recommendations    ?Recommendations for Other Services ?    ?  ?Functional Status Assessment Patient has had a recent decline in their functional status and demonstrates the ability to make significant  improvements in function in a reasonable and predictable amount of time.  ? ?  ?Precautions / Restrictions Precautions ?Precautions: Fall ?Precaution Comments: onset of dizziness/passing out with head turns to R and L ?Restrictions ?Weight Bearing Restrictions: No  ? ?  ? ?Mobility ? Bed Mobility ?Overal bed mobility: Independent ?  ?  ?  ?  ?  ?  ?General bed mobility comments: HOB flat ?  ? ?Transfers ?Overall transfer level: Needs assistance ?Equipment used: None ?Transfers: Sit to/from Stand ?Sit to Stand: Supervision ?  ?  ?  ?  ?  ?General transfer comment: supervision for safety, wide base of support, push up from bed ?  ? ?Ambulation/Gait ?Ambulation/Gait assistance: Min guard, Min assist ?Gait Distance (Feet): 200 Feet ?Assistive device: None ?Gait Pattern/deviations: Step-through pattern, Decreased stride length, Decreased dorsiflexion - right, Decreased dorsiflexion - left ?Gait velocity: dec compared to normal ?Gait velocity interpretation: 1.31 - 2.62 ft/sec, indicative of limited community ambulator ?  ?General Gait Details: pt with fluid, reciprocal gait pattern however with noted lateral sway t/o and episode of scuffing L and R LE causing pt to trip up requiring minA to prevent fall. Pt reports "I can feel my L leg isn't as strong and is getting tired". when pt asked to turn head to the L pt with immeadiate dizziness sensation and had to stop ambulating and hold onto PT ? ?Stairs ?  ?  ?  ?  ?  ? ?Wheelchair Mobility ?  ? ?Modified Rankin (Stroke Patients Only) ?Modified Rankin (Stroke Patients Only) ?Pre-Morbid Rankin Score: No symptoms ?Modified Rankin: Moderate disability ? ?  ? ?Balance Overall  balance assessment: Needs assistance ?Sitting-balance support: Feet supported, No upper extremity supported ?Sitting balance-Leahy Scale: Normal ?Sitting balance - Comments: pt able to don sneakers at EOB without assist to maintain balance or to don sneakers ?  ?Standing balance support: No upper extremity  supported, During functional activity ?Standing balance-Leahy Scale: Fair ?  ?  ?  ?  ?  ?  ?  ?  ?  ?Standardized Balance Assessment ?Standardized Balance Assessment : Dynamic Gait Index ?  ?Dynamic Gait Index ?Level Surface: Mild Impairment ?Change in Gait Speed: Mild Impairment ?Gait with Horizontal Head Turns: Moderate Impairment ?Gait with Vertical Head Turns: Mild Impairment ?Gait and Pivot Turn: Mild Impairment ?Step Over Obstacle: Mild Impairment ?Step Around Obstacles: Mild Impairment ?Steps: Mild Impairment ?Total Score: 15 ?   ? ? ? ?Pertinent Vitals/Pain Pain Assessment ?Pain Assessment: 0-10 ?Pain Score: 4  ?Pain Location: R side of neck, sore to touch ?Pain Descriptors / Indicators: Sore ?Pain Intervention(s): Monitored during session  ? ? ?Home Living Family/patient expects to be discharged to:: Private residence ?Living Arrangements: Spouse/significant other ?Available Help at Discharge: Family;Available 24 hours/day ?Type of Home: House ?Home Access: Stairs to enter ?  ?Entrance Stairs-Number of Steps: 1 (in the front, 6 in the back) ?Alternate Level Stairs-Number of Steps: flight ?Home Layout: Two level ?Home Equipment: Kasandra Knudsen - single point ?   ?  ?Prior Function Prior Level of Function : Independent/Modified Independent ?  ?  ?  ?  ?  ?  ?Mobility Comments: drove, no AD ?ADLs Comments: Indep ?  ? ? ?Hand Dominance  ? Dominant Hand: Left ? ?  ?Extremity/Trunk Assessment  ? Upper Extremity Assessment ?Upper Extremity Assessment: RUE deficits/detail ?RUE Deficits / Details: generalized weakness to 4/5 compared to the L UE, pt states "my L arm has always been a little stronger" ?  ? ?Lower Extremity Assessment ?Lower Extremity Assessment: Overall WFL for tasks assessed (MMT grossly 5/5 however pt with occassional trip/scuffing of R LE and L LE during ambulation) ?  ? ?Cervical / Trunk Assessment ?Cervical / Trunk Assessment: Other exceptions (R ICA occlusion)  ?Communication  ? Communication: No  difficulties  ?Cognition Arousal/Alertness: Awake/alert ?Behavior During Therapy: [facility] for tasks assessed/performed ?Overall Cognitive Status: Within Functional Limits for tasks assessed ?  ?  ?  ?  ?  ?  ?  ?  ?  ?  ?  ?  ?  ?  ?  ?  ?  ?  ?  ? ?  ?General Comments General comments (skin integrity, edema, etc.): HR 91bpm during amb ? ?  ?Exercises    ? ?Assessment/Plan  ?  ?PT Assessment Patient needs continued PT services  ?PT Problem List Decreased strength;Decreased range of motion;Decreased activity tolerance;Decreased balance;Decreased mobility;Decreased coordination;Decreased safety awareness ? ?   ?  ?PT Treatment Interventions DME instruction;Functional mobility training;Therapeutic activities;Therapeutic exercise;Balance training;Neuromuscular re-education   ? ?PT Goals (Current goals can be found in the Care Plan section)  ?Acute Rehab PT Goals ?Patient Stated Goal: get back to how I was ?PT Goal Formulation: With patient ?Time For Goal Achievement: 02/20/22 ?Potential to Achieve Goals: Good ?Additional Goals ?Additional Goal #1: Pt to score >19 on DGI to indicate minimal falls risk. ? ?  ?Frequency Min 3X/week ?  ? ? ?Co-evaluation   ?  ?  ?  ?  ? ? ?  ?AM-PAC PT "6 Clicks" Mobility  ?Outcome Measure Help needed turning from your back to your side while in a flat  bed without using bedrails?: None ?Help needed moving from lying on your back to sitting on the side of a flat bed without using bedrails?: None ?Help needed moving to and from a bed to a chair (including a wheelchair)?: A Little ?Help needed standing up from a chair using your arms (e.g., wheelchair or bedside chair)?: A Little ?Help needed to walk in hospital room?: A Little ?Help needed climbing 3-5 steps with a railing? : A Little ?6 Click Score: 20 ? ?  ?End of Session Equipment Utilized During Treatment: Gait belt ?Activity Tolerance: Patient tolerated treatment well ?Patient left: in chair;with call bell/phone within reach;with chair alarm  set ?Nurse Communication: Mobility status (onset of dizziness/passing out sensation with head turns to R and L) ?PT Visit Diagnosis: Unsteadiness on feet (R26.81);Muscle weakness (generalized) (M62.81);Difficulty

## 2022-02-06 NOTE — TOC CAGE-AID Note (Signed)
Transition of Care (TOC) - CAGE-AID Screening ? ? ?Patient Details  ?Name: Clinton Ross ?MRN: [id number] ?Date of Birth: [birth year] ? ?Transition of Care (TOC) CM/SW Contact:    ?Mylee Falin C Tarpley-Carter, LCSWA ?Phone Number: ?02/06/2022, 1:39 PM ? ? ?Clinical Narrative: ?Pt participated in [place].  Pt stated he does use substance.  Pt was offered resources, due to usage of substance.    ? ?Passenger transport manager, MSW, LCSW-A ?Pronouns:  She/Her/Hers ?[facility] ?Clinical Social Worker ?Direct Number:  [phone] ?[email or web address] ? ?CAGE-AID Screening: ?  ? ?Have You Ever Felt You Ought to Cut Down on Your Drinking or Drug Use?: No ?Have People Annoyed You By Critizing Your Drinking Or Drug Use?: No ?Have You Felt Bad Or Guilty About Your Drinking Or Drug Use?: No ?Have You Ever Had a Drink or Used Drugs First Thing In The Morning to Steady Your Nerves or to Get Rid of a Hangover?: No ?CAGE-AID Score: 0 ? ?Substance Abuse Education Offered: Yes ? ?Substance abuse interventions: Educational Materials ? ? ? ? ? ? ?

## 2022-02-06 NOTE — Evaluation (Signed)
Occupational Therapy Evaluation ?Patient Details ?Name: Clinton Ross ?MRN: [id number] ?DOB: 11/11/1965 ?Today's Date: 02/06/2022 ? ? ?History of Present Illness Pt is a 56yo male presenting to ED with L UE numbness and slurred speech. 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 PMH: HTN, adenomcarcinoma of rectum s/p colostomy, chronic back pain  ? ?Clinical Impression ?  ?Pt independent at baseline with ADLs and functional mobility, currently mod I for ADLs, bed mobility, and supervision for tranfers pushing IV pole. Pt reporting "prism-like" vision in R eye, difficulty seeing in bright lights, states it also gives him a headache. Educated pt on BEFAST, safety with showering at home, and having spouse supervise medication mgmt. Pt verbalizes understanding. Pt presenting with impairments listed below, will follow acutely. Recommend OP OT at d/c. ?   ? ?Recommendations for follow up therapy are one component of a multi-disciplinary discharge planning process, led by the attending physician.  Recommendations may be updated based on patient status, additional functional criteria and insurance authorization.  ? ?Follow Up Recommendations ? Outpatient OT  ?  ?Assistance Recommended at Discharge Set up Supervision/Assistance  ?Patient can return home with the following Direct supervision/assist for medications management;Direct supervision/assist for financial management;Assist for transportation ? ?  ?Functional Status Assessment ?    ?Equipment Recommendations ? BSC/3in1  ?  ?Recommendations for Other Services   ? ? ?  ?Precautions / Restrictions Precautions ?Precautions: Fall ?Precaution Comments: onset of dizziness/passing out with head turns to R and L ?Restrictions ?Weight Bearing Restrictions: No  ? ?  ? ?Mobility Bed Mobility ?Overal bed mobility: Modified Independent ?  ?  ?  ?  ?  ?  ?  ?  ? ?Transfers ?Overall transfer level: Needs  assistance ?Equipment used: None ?Transfers: Sit to/from Stand ?Sit to Stand: Supervision ?  ?  ?  ?  ?  ?  ?  ? ?  ?Balance Overall balance assessment: Needs assistance ?Sitting-balance support: Feet supported, No upper extremity supported ?Sitting balance-Leahy Scale: Normal ?  ?  ?Standing balance support: No upper extremity supported, During functional activity ?Standing balance-Leahy Scale: Fair ?Standing balance comment: pushes IV pole ?  ?  ?  ?  ?  ?  ?  ?  ?  ?  ?  ?   ? ?ADL either performed or assessed with clinical judgement  ? ?ADL   ?Eating/Feeding: Modified independent;Sitting ?  ?Grooming: Modified independent;Standing ?  ?Upper Body Bathing: Modified independent;Sitting;Standing ?  ?Lower Body Bathing: Modified independent;Sitting/lateral leans;Sit to/from stand ?  ?Upper Body Dressing : Modified independent;Sitting;Standing ?  ?Lower Body Dressing: Modified independent;Sitting/lateral leans;Sit to/from stand ?  ?Toilet Transfer: Modified Independent;Ambulation;Regular Toilet ?Toilet Transfer Details (indicate cue type and reason): can empty ostomy bag and urinate in standing without difficutly ?Toileting- Clothing Manipulation and Hygiene: Modified independent;Sitting/lateral lean;Sit to/from stand ?  ?  ?  ?Functional mobility during ADLs: Modified independent ?   ? ? ? ?Vision Baseline Vision/History: 1 Wears glasses ?Vision Assessment?: Yes ?Eye Alignment: Within Functional Limits ?Ocular Range of Motion: Within Functional Limits ?Alignment/Gaze Preference: Within Defined Limits ?Tracking/Visual Pursuits: Able to track stimulus in all quads without difficulty ?Visual Fields: No apparent deficits ?Additional Comments: pt reports R eye with "prism-like" vision, states he has consulted his eye dr  ?   ?Perception Perception ?Perception: Within Functional Limits ?  ?Praxis Praxis ?Praxis: Intact ?  ? ?Pertinent Vitals/Pain Pain Assessment ?Pain Assessment: No/denies pain ?Pain Intervention(s):  Monitored during session  ? ? ? ?Hand Dominance Left ?  ?Extremity/Trunk Assessment Upper Extremity Assessment ?Upper Extremity Assessment: RUE deficits/detail ?RUE Deficits / Details: generalized weakness to 4/5 compared to the L UE, pt states "my L arm has always been a little stronger" ?  ?Lower Extremity Assessment ?Lower Extremity Assessment: Defer to PT evaluation ?  ?  ?  ?Communication Communication ?Communication: No difficulties ?  ?Cognition Arousal/Alertness: Awake/alert ?Behavior During Therapy: [facility] for tasks assessed/performed ?Overall Cognitive Status: Within Functional Limits for tasks assessed ?  ?  ?  ?  ?  ?  ?  ?  ?  ?  ?  ?  ?  ?  ?  ?  ?General Comments: scoring 3 on SBT indicative of normal cognition ?  ?  ?General Comments  VSS on RA ? ?  ?Exercises   ?  ?Shoulder Instructions    ? ? ?Home Living Family/patient expects to be discharged to:: Private residence ?Living Arrangements: Spouse/significant other ?Available Help at Discharge: Family;Available 24 hours/day ?Type of Home: House ?Home Access: Stairs to enter ?Entrance Stairs-Number of Steps: 1 ?  ?Home Layout: Two level ?Alternate Level Stairs-Number of Steps: flight ?  ?Bathroom Shower/Tub: Tub/shower unit ?  ?Bathroom Toilet: Standard ?  ?  ?Home Equipment: Kasandra Knudsen - single point ?  ?  ?  ? ?  ?Prior Functioning/Environment Prior Level of Function : Independent/Modified Independent ?  ?  ?  ?  ?  ?  ?Mobility Comments: drove, no AD ?ADLs Comments: Indep ?  ? ?  ?  ?OT Problem List:   ?  ?   ?OT Treatment/Interventions:    ?  ?OT Goals(Current goals can be found in the care plan section)    ?OT Frequency: Min 2X/week ?  ? ?Co-evaluation   ?  ?  ?  ?  ? ?  ?AM-PAC OT "6 Clicks" Daily Activity     ?Outcome Measure Help from another person eating meals?: None ?Help from another person taking care of personal grooming?: None ?Help from another person toileting, which includes using toliet, bedpan, or urinal?: None ?Help from another person  bathing (including washing, rinsing, drying)?: A Little ?Help from another person to put on and taking off regular upper body clothing?: None ?Help from another person to put on and taking off regular lower body clothing?: None ?6 Click Score: 23 ?  ?End of Session Equipment Utilized During Treatment: Gait belt ?Nurse Communication: Mobility status ? ?Activity Tolerance: Patient tolerated treatment well ?Patient left: in bed;with call bell/phone within reach;with bed alarm set ? ?OT Visit Diagnosis: Unsteadiness on feet (R26.81);Other abnormalities of gait and mobility (R26.89);Muscle weakness (generalized) (M62.81)  ?              ?Time: 6387-5643 ?OT Time Calculation (min): 41 min ?Charges:  OT General Charges ?$OT Visit: 1 Visit ?OT Evaluation ?$OT Eval Low Complexity: 1 Low ?OT Treatments ?$Therapeutic Activity: 23-37 mins ? ?Lynnda Child, OTD, OTR/L ?Acute Rehab ?[phone] ? ? ?Kaylyn Lim ?02/06/2022, 1:43 PM ?

## 2022-02-06 NOTE — Progress Notes (Signed)
PIV removed. Discharge instructions completed. Patient verbalized understanding of medication regimen, follow up appointments and discharge instructions. Patient belongings gathered and packed to discharge. Study medication with patient. ? ?

## 2022-02-06 NOTE — Progress Notes (Signed)
?  Echocardiogram ?2D Echocardiogram has been performed. ? ?Clinton Ross ?02/06/2022, 9:33 AM ?

## 2022-02-06 NOTE — Progress Notes (Signed)
[place] [facility] ?New Study Start: OCEANIC-STROKE ?  ?SUMMARY ?For more information refer to: [email or web address]. Study Identifier: [id number] ?   ?A Multicenter, International, Randomized, Placebo Controlled, Double-blind, Parallel Group and Event Driven Phase 3 Study of the Oral FXIa Inhibitor Asundexian ([place] [id number]) for the Prevention of Ischemic Stroke in Male and Male Participants Aged 58 Years and Older After an Acute Non-cardioembolic Ischemic Stroke or High-risk TIA  ?Brief Summary This study will randomize adult patients with an initial diagnosis of acute non-cardioembolic ischemic stroke or high-risk transient ischemic attack (TIA) to treatment within 72 hours of symptom onset and with the intention to be treated with antiplatelet therapy.  ?Design Phase 3, Randomized, Interventional, Event-Driven  ?Intervention Asundexian [phone]) 50 mg tablets or matching placebo tablets (pink, oval, film-coated tablets  ?Concomitant Therapy Participants are to receive antiplatelet therapy (single or dual; provider discretion) during the study conduct.  ?Prohibited Therapy Oral anticoagulation ?Full dose and/or long-term anticoagulation therapy with heparin or LMWH  ?Chronic (more than 4 weeks continuous) therapy with NSAIDs during the study conduct ?Concomitant use of combined P-gp and strong/moderate CYP3A4 inducers ?Concomitant use of combined P-gp and strong CYP3A4 inhibitors ?Herbal/traditional medications and/or supplements with known anticoagulant/antiplatelet effects  ?Anticoagulation Prophylaxis Venous thromboembolism prophylaxis with LMWH or unfractionated heparin for short periods of time (2 weeks) is allowed.  ?Potential Drug-Drug Interactions Rosuvastatin 20 mg daily or higher or atorvastatin 80 mg (additional monitoring may be warranted due to increased concentrations of rosuvastatin and atorvastatin - asundexian ([id number]) is a weak inhibitor of CYP2C8)  ?Administration   Can be taken irrespective of food intake. Should be swallowed whole with water, preferably in the morning. CANNOT be crushed or broken.  ?  ?  ?Plan: Start Cecille Rubin [phone]) 50 mg tablets or placebo] today.  ?  ?Please contact IDS if any questions or concerns regarding the study medication.  ?  ?Acey Lav, PharmD, BCPS ?Investigational Drug Service Pharmacist  ?[phone] ? ?

## 2022-02-06 NOTE — Progress Notes (Signed)
Patient requires a 3 in 1 due to the inability to ambulate to his home toilet in a timely manner.  ?

## 2022-02-06 NOTE — Progress Notes (Addendum)
STROKE TEAM PROGRESS NOTE  ? ?INTERVAL HISTORY ?Patient is seen in his room with no family at the bedside.  He states that yesterday, he experienced sudden onset aphasia and dysarthria which resolved in about five minutes spontaneously.  Earlier that day, he had a transient episode of left arm numbness and tingling which also resolved spontaneously.  MRI reveals acute and subacute infarcts in the right frontal, parietal and occipital lobes. ? ?Vitals:  ? 02/05/22 2130 02/05/22 2300 02/05/22 2355 02/06/22 0150  ?BP: (!) 154/83 109/89 121/82 117/74  ?Pulse: (!) 59 65    ?Resp: '15 12 13 13  '$ ?Temp:   98.6 ?F (37 ?C)   ?TempSrc:   Oral   ?SpO2: 98% 95% 96% 96%  ?Weight:      ?Height:      ? ?CBC:  ?Recent Labs  ?Lab 02/05/22 ?1006 02/05/22 ?1822  ?WBC 7.5 6.6  ?NEUTROABS 5.8  --   ?HGB 16.5 15.8  ?HCT 49.6 46.2  ?MCV 87.2 85.9  ?PLT 215 204  ? ?Basic Metabolic Panel:  ?Recent Labs  ?Lab 02/05/22 ?1006  ?NA 138  ?K 3.8  ?CL 106  ?CO2 26  ?GLUCOSE 88  ?BUN 12  ?CREATININE 1.03  ?CALCIUM 9.1  ? ?Lipid Panel:  ?Recent Labs  ?Lab 02/06/22 ?0335  ?CHOL 152  ?TRIG 98  ?HDL 23*  ?CHOLHDL 6.6  ?VLDL 20  ?LDLCALC 109*  ? ?HgbA1c:  ?Recent Labs  ?Lab 02/05/22 ?1822  ?HGBA1C 5.5  ? ?Urine Drug Screen:  ?Recent Labs  ?Lab 02/05/22 ?1006  ?LABOPIA NONE DETECTED  ?COCAINSCRNUR NONE DETECTED  ?LABBENZ NONE DETECTED  ?AMPHETMU NONE DETECTED  ?THCU POSITIVE*  ?LABBARB NONE DETECTED  ?  ?Alcohol Level No results for input(s): ETH in the last 168 hours. ? ?IMAGING past 24 hours ?MR ANGIO HEAD WO CONTRAST ? ?Result Date: 02/05/2022 ?CLINICAL DATA:  Transient ischemic attack EXAM: MRI HEAD WITHOUT CONTRAST MRA HEAD WITHOUT CONTRAST TECHNIQUE: Multiplanar, multi-echo pulse sequences of the brain and surrounding structures were acquired without intravenous contrast. Angiographic images of the Circle of Willis were acquired using MRA technique without intravenous contrast. COMPARISON:  None Available. FINDINGS: MRI HEAD Brain: Small foci variably  reduced diffusion are present in the right frontal much greater than parietal and occipital lobes. Ventricles and sulci are within normal limits in size and configuration. Minimal susceptibility in the right occipital lobe may reflect chronic blood products. No intracranial mass or mass effect. No hydrocephalus or extra-axial collection. Vascular: Major vessel flow voids at the skull base are preserved. Skull and upper cervical spine: Normal marrow signal is preserved. Sinuses/Orbits: Paranasal sinuses are aerated. Orbits are unremarkable. Other: Sella is unremarkable.  Mastoid air cells are clear. MRA HEAD Motion artifact is present. There is minimal flow related enhancement of the intracranial right internal carotid artery. Partial reconstitution near the terminus. Left intracranial internal carotid is patent with atherosclerotic irregularity. Anterior and middle cerebral arteries are patent. Flow related enhancement appears relatively diminished in the right MCA territory. Anterior communicating artery appears to be present. Included intracranial vertebral arteries are patent. Basilar artery is patent. Posterior cerebral arteries are patent. Posterior communicating arteries are not identified. IMPRESSION: Small foci of acute and subacute infarction in the right frontal lobe and to a lesser extent parietal and occipital lobes. There is abnormal appearance of the visualized right internal carotid artery, which may reflect high-grade stenosis more proximally. Small chronic right occipital infarct. Electronically Signed   By: Macy Mis M.D.   On:  02/05/2022 13:06  ? ?MR BRAIN WO CONTRAST ? ?Result Date: 02/05/2022 ?CLINICAL DATA:  Transient ischemic attack EXAM: MRI HEAD WITHOUT CONTRAST MRA HEAD WITHOUT CONTRAST TECHNIQUE: Multiplanar, multi-echo pulse sequences of the brain and surrounding structures were acquired without intravenous contrast. Angiographic images of the Circle of Willis were acquired using MRA  technique without intravenous contrast. COMPARISON:  None Available. FINDINGS: MRI HEAD Brain: Small foci variably reduced diffusion are present in the right frontal much greater than parietal and occipital lobes. Ventricles and sulci are within normal limits in size and configuration. Minimal susceptibility in the right occipital lobe may reflect chronic blood products. No intracranial mass or mass effect. No hydrocephalus or extra-axial collection. Vascular: Major vessel flow voids at the skull base are preserved. Skull and upper cervical spine: Normal marrow signal is preserved. Sinuses/Orbits: Paranasal sinuses are aerated. Orbits are unremarkable. Other: Sella is unremarkable.  Mastoid air cells are clear. MRA HEAD Motion artifact is present. There is minimal flow related enhancement of the intracranial right internal carotid artery. Partial reconstitution near the terminus. Left intracranial internal carotid is patent with atherosclerotic irregularity. Anterior and middle cerebral arteries are patent. Flow related enhancement appears relatively diminished in the right MCA territory. Anterior communicating artery appears to be present. Included intracranial vertebral arteries are patent. Basilar artery is patent. Posterior cerebral arteries are patent. Posterior communicating arteries are not identified. IMPRESSION: Small foci of acute and subacute infarction in the right frontal lobe and to a lesser extent parietal and occipital lobes. There is abnormal appearance of the visualized right internal carotid artery, which may reflect high-grade stenosis more proximally. Small chronic right occipital infarct. Electronically Signed   By: Macy Mis M.D.   On: 02/05/2022 13:06  ? ?ECHOCARDIOGRAM COMPLETE ? ?Result Date: 02/06/2022 ?   ECHOCARDIOGRAM REPORT   Patient Name:   Clinton Ross Date of Exam: 02/06/2022 Medical Rec #:  [id number]    Height:       70.0 in Accession #:    [id number]   Weight:       173.0 lb  Date of Birth:  [birth year]    BSA:          1.963 m? Patient Age:    56 years     BP:           117/74 mmHg Patient Gender: M            HR:           62 bpm. Exam Location:  Inpatient Procedure: 2D Echo, Cardiac Doppler and Color Doppler Indications:    Stroke I63.9  History:        Patient has no prior history of Echocardiogram examinations.                 Risk Factors:Hypertension.  Sonographer:    Bernadene Person RDCS Referring Phys: [id number] [facility]  Sonographer Comments: Image acquisition challenging due to respiratory motion. IMPRESSIONS  1. Left ventricular ejection fraction, by estimation, is 55 to 60%. The left ventricle has normal function. The left ventricle has no regional wall motion abnormalities. Left ventricular diastolic parameters were normal.  2. Right ventricular systolic function is normal. The right ventricular size is normal. Tricuspid regurgitation signal is inadequate for assessing PA pressure.  3. The mitral valve is normal in structure. Trivial mitral valve regurgitation. No evidence of mitral stenosis.  4. The aortic valve is normal in structure. Aortic valve regurgitation is not visualized. No aortic stenosis is present.  5. Aortic dilatation noted. There is borderline dilatation of the ascending aorta, measuring 39 mm.  6. The inferior vena cava is normal in size with <50% respiratory variability, suggesting right atrial pressure of 8 mmHg. Comparison(s): No prior Echocardiogram. Conclusion(s)/Recommendation(s): Normal biventricular function without evidence of hemodynamically significant valvular heart disease. No intracardiac source of embolism detected on this transthoracic study. Consider a transesophageal echocardiogram to exclude cardiac source of embolism if clinically indicated. FINDINGS  Left Ventricle: Left ventricular ejection fraction, by estimation, is 55 to 60%. The left ventricle has normal function. The left ventricle has no regional wall motion abnormalities. The  left ventricular internal cavity size was normal in size. There is  no left ventricular hypertrophy. Left ventricular diastolic parameters were normal. Right Ventricle: The right ventricular size is norm

## 2022-02-06 NOTE — Research (Signed)
Pt interested in OCEANIC trial. Gave him pamphlet to read. Also gave him time to read the ICF. He then signed ICF at 2:15pm. Blood draw around 3:40pm. And investigational meds was given around 4pm. Blood sample and pt questionnaire survey sent to research office.  ? ?Rosalin Hawking, MD PhD ?Stroke Neurology ?02/06/2022 ?4:08 PM ? ?

## 2022-02-06 NOTE — TOC Initial Note (Signed)
Transition of Care (TOC) - Initial/Assessment Note  ? ? ?Patient Details  ?Name: Clinton Ross ?MRN: [id number] ?Date of Birth: [birth year] ? ?Transition of Care (TOC) CM/SW Contact:    ?Pollie Friar, RN ?Phone Number: ?02/06/2022, 1:55 PM ? ?Clinical Narrative:                 ?Patient is from home with his spouse. He is active with the [place] through [facility]. Patients spouse can provide needed supervision and transportation.  ?Patient manages his own medications at home and gets most of his meds through the [place]. At d/c medications will need to go either through [facility] or a local pharmacy so he will have them for the first 30 days.  ?Recommendations are for outpatient therapy. Pt has been  to [facility] outpatient rehab in the past and would like to attend there again. Orders in Epic and information on the AVS.  ?Pt with orders for cane and 3 in 1. [employer] will deliver the DME to the room. ?TOC following. ? ?Expected Discharge Plan: OP Rehab ?Barriers to Discharge: Continued Medical Work up ? ? ?Patient Goals and CMS Choice ?  ?  ?Choice offered to / list presented to : Patient ? ?Expected Discharge Plan and Services ?Expected Discharge Plan: OP Rehab ?  ?Discharge Planning Services: CM Consult ?  ?Living arrangements for the past 2 months: [place] ?                ?DME Arranged: 3-N-1, Cane ?DME Agency: [employer] ?Date DME Agency Contacted: 02/06/22 ?  ?Representative spoke with at DME Agency: [place] ?  ?  ?  ?  ?  ? ?Prior Living Arrangements/Services ?Living arrangements for the past 2 months: [place] ?Lives with:: Spouse ?Patient language and need for interpreter reviewed:: Yes ?Do you feel safe going back to the place where you live?: Yes      ?Need for Family Participation in Patient Care: Yes (Comment) ?Care giver support system in place?: Yes (comment) ?  ?Criminal Activity/Legal Involvement Pertinent to Current Situation/Hospitalization: No - Comment as needed ? ?Activities of Daily  Living ?Home Assistive Devices/Equipment: Ostomy supplies ?ADL Screening (condition at time of admission) ?Patient's cognitive ability adequate to safely complete daily activities?: Yes ?Is the patient deaf or have difficulty hearing?: No ?Does the patient have difficulty seeing, even when wearing glasses/contacts?: No ?Does the patient have difficulty concentrating, remembering, or making decisions?: No ?Patient able to express need for assistance with ADLs?: Yes ?Does the patient have difficulty dressing or bathing?: No ?Independently performs ADLs?: Yes (appropriate for developmental age) ?Does the patient have difficulty walking or climbing stairs?: No ?Weakness of Legs: None ?Weakness of Arms/Hands: None ? ?Permission Sought/Granted ?  ?  ?   ?   ?   ?   ? ?Emotional Assessment ?Appearance:: Appears stated age ?Attitude/Demeanor/Rapport: Engaged ?Affect (typically observed): Accepting ?Orientation: : Oriented to Self, Oriented to Place, Oriented to  Time, Oriented to Situation ?  ?Psych Involvement: No (comment) ? ?Admission diagnosis:  Acute ischemic stroke [facility]) [I63.9] ?Cerebrovascular accident (CVA), unspecified mechanism ([place]) [I63.9] ?Patient Active Problem List  ? Diagnosis Date Noted  ? Acute ischemic stroke ([place]) 02/05/2022  ? Pancreatitis 06/08/2018  ? Chest pain 07/21/2016  ? Cholelithiasis 11/28/2013  ? Tobacco abuse 11/28/2013  ? Abdominal pain 11/27/2013  ? Acute pancreatitis 11/20/2013  ? Constipation 11/20/2013  ? HTN (hypertension), benign 11/20/2013  ? PTSD (post-traumatic stress disorder) 11/20/2013  ? Sciatica 05/24/2009  ?  DERANGEMENT MENISCUS 10/24/2008  ? JOINT EFFUSION, KNEE 10/24/2008  ? KNEE PAIN 10/24/2008  ? ?PCP:  [facility], [place]:   ?[place], [address] ?[place] [address] ?Phone: [phone] Fax: [phone] ? ?[id number] - , [address] ?[place] ?[place] [address] ?Phone: [phone] Fax: [phone] ? ?[place] [address] ?Phone: [phone] Fax: [phone] ? ? ? ? ?Social Determinants of Health (SDOH) Interventions ?  ? ?Readmission Risk Interventions ?   ? View : No data to display.  ?  ?  ?  ? ? ? ?

## 2022-02-06 NOTE — Evaluation (Signed)
Speech Language Pathology Evaluation ?Patient Details ?Name: Clinton Ross ?MRN: [id number] ?DOB: [birth year] ?Today's Date: 02/06/2022 ?Time: 1655-3748 ?SLP Time Calculation (min) (ACUTE ONLY): 11 min ? ?Problem List:  ?Patient Active Problem List  ? Diagnosis Date Noted  ? Acute ischemic stroke ([place]) 02/05/2022  ? Pancreatitis 06/08/2018  ? Chest pain 07/21/2016  ? Cholelithiasis 11/28/2013  ? Tobacco abuse 11/28/2013  ? Abdominal pain 11/27/2013  ? Acute pancreatitis 11/20/2013  ? Constipation 11/20/2013  ? HTN (hypertension), benign 11/20/2013  ? PTSD (post-traumatic stress disorder) 11/20/2013  ? Sciatica 05/24/2009  ? DERANGEMENT MENISCUS 10/24/2008  ? JOINT EFFUSION, KNEE 10/24/2008  ? KNEE PAIN 10/24/2008  ? ?Past Medical History:  ?Past Medical History:  ?Diagnosis Date  ? Acute pancreatitis 10/2013  ? Arthritis   ? Bulging disc   ? Cancer [facility])   ? Colon  ? Chronic knee pain   ? Colostomy in place [facility])   ? Gallstones   ? Hypertension   ? PTSD (post-traumatic stress disorder)   ? Sciatica   ? ?Past Surgical History:  ?Past Surgical History:  ?Procedure Laterality Date  ? CHOLECYSTECTOMY    ? COLON SURGERY    ? rectal cancer Right   ? ?HPI:  ?Pt is a 56yo male presenting to ED with L UE numbness and slurred speech. 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 PMH: HTN, adenomcarcinoma of rectum s/p colostomy, chronic back pain  ? ?Assessment / Plan / Recommendation ?Clinical Impression ? Patient presents with baseline/normal cognitive-linguistic function. Per patient, speech symptoms lasted approximately 5 minutes before returning to baseline. No skilled SLP f/u indicated at this time. ?   ?SLP Assessment ? SLP Recommendation/Assessment: Patient does not need any further [facility] ?SLP Visit Diagnosis: Aphasia (R47.01)  ?  ?Recommendations for follow up therapy are one component of a multi-disciplinary discharge planning  process, led by the attending physician.  Recommendations may be updated based on patient status, additional functional criteria and insurance authorization. ?   ?Follow Up Recommendations ? No SLP follow up  ?  ?   ?   ?   ?   ?SLP Evaluation ?Cognition ? Overall Cognitive Status: Within Functional Limits for tasks assessed ?Orientation Level: Oriented X4  ?  ?   ?Comprehension ? Auditory Comprehension ?Overall Auditory Comprehension: Appears within functional limits for tasks assessed ?Visual Recognition/Discrimination ?Discrimination: Not tested ?Reading Comprehension ?Reading Status: Not tested  ?  ?Expression Expression ?Primary Mode of Expression: Verbal ?Verbal Expression ?Overall Verbal Expression: Appears within functional limits for tasks assessed ?Written Expression ?Dominant Hand: Left   ?Oral / Motor ? Oral Motor/Sensory Function ?Overall Oral Motor/Sensory Function: Within functional limits ?Motor Speech ?Overall Motor Speech: Appears within functional limits for tasks assessed   ?        ?Crescentia Boutwell MA, CCC-SLP ? ?Falcon Mccaskey Meryl ?02/06/2022, 9:46 AM ? ?

## 2022-02-08 DIAGNOSIS — F1210 Cannabis abuse, uncomplicated: Secondary | ICD-10-CM | POA: Diagnosis present

## 2022-02-08 DIAGNOSIS — I6521 Occlusion and stenosis of right carotid artery: Secondary | ICD-10-CM | POA: Diagnosis present

## 2022-02-08 DIAGNOSIS — E785 Hyperlipidemia, unspecified: Secondary | ICD-10-CM | POA: Diagnosis present

## 2022-02-08 NOTE — Discharge Summary (Signed)
Physician Discharge Summary   Clinton: Clinton Ross MRN: [id number] DOB: [birth year]  Admit date:     02/05/2022  Discharge date: 02/06/2022  Discharge Physician: Berle Mull  [facility]P: [facility]  Recommendations at discharge:  Follow up with pcp as recommended   Discharge Diagnoses: Principal Problem:   Acute ischemic stroke [facility]) Active Problems:   Essential hypertension   Tobacco abuse   Internal carotid artery stenosis, right   Hyperlipidemia   Marijuana abuse  Hospital Course: Clinton Ross is a 56 y.o. male with medical history significant of hypertension, adenocarcinoma of the rectum, chronic back pain initially presented to [facility] ED with complaint of slurred speech.  Clinton had CT head followed by MRI brain and MR angiogram of the head and was found to have acute and subacute infarction in the right frontal lobe and to parietal and occipital lobes.  Possible high-grade stenosis of proximal right internal carotid artery.   Assessment and Plan: Acute right scattered acute and subacute infarcts in right frontal, parietal and occipital lobes infarct  likely secondary due to right ICA occlusion CT head No acute abnormality.  CTA head & neck complete occlusion of right ICA MRI  small infarcts in right frontal, parietal and occipital lobes MRA  minimal enhancement of right ICA with partial reconstitution near terminus 2D Echo EF 55-60%, normal atrial septum LDL 109 on statin HgbA1c 5.5 aspirin 81 mg daily prior to admission, now on aspirin 81 mg daily and clopidogrel 75 mg daily for 3 months and then plavix alone.  Clinton enrolled to [facility] trial Therapy recommendations:  outpatient Clinton   Right ICA occlusion [facility] Dr. Trula Slade consulted, No surgical intervention needed, continue medical management   Hypertension Home meds:  amlodipine 5 mg daily Stable Avoid low BP   Hyperlipidemia LDL 109, goal < 70 Add atorvastatin 40 mg daily    Tobacco abuse Current  smoker Smoking cessation counseling provided Clinton is willing to quit   Substance abuse - UDS:  THC POSITIVE. Clinton advised to stop using due to stroke risk.  Consultants: Neurology  Vascular surgery  Procedures performed:  Echocardiogram  DISCHARGE MEDICATION: Allergies as of 02/06/2022       Reactions   Peanut-containing Drug Products Itching   Walnuts   Morphine And Related    Altered mental status-burning sensation all over        Medication List     STOP taking these medications    doxycycline 100 MG capsule Commonly known as: VIBRAMYCIN   ibuprofen 800 MG tablet Commonly known as: ADVIL   oxyCODONE-acetaminophen 5-325 MG tablet Commonly known as: PERCOCET/ROXICET   predniSONE 50 MG tablet Commonly known as: DELTASONE       TAKE these medications    amLODipine 5 MG tablet Commonly known as: NORVASC Take 1 tablet (5 mg total) by mouth daily. What changed: how much to take   aspirin 81 MG chewable tablet Chew 1 tablet (81 mg total) by mouth daily. What changed: when to take this   atorvastatin 40 MG tablet Commonly known as: LIPITOR Take 1 tablet (40 mg total) by mouth daily.   cetirizine 10 MG tablet Commonly known as: ZYRTEC Take 10 mg by mouth daily.   clopidogrel 75 MG tablet Commonly known as: PLAVIX Take 1 tablet (75 mg total) by mouth daily.   cyclobenzaprine 10 MG tablet Commonly known as: FLEXERIL Take 1 tablet (10 mg total) by mouth at bedtime.   fluticasone 50 MCG/ACT nasal spray Commonly  known as: FLONASE Place 1 spray into both nostrils 2 (two) times daily.   nitroGLYCERIN 0.4 MG SL tablet Commonly known as: NITROSTAT Place 1 tablet (0.4 mg total) under the tongue every 5 (five) minutes as needed for chest pain.   OCEANIC-STROKE asundexian or placebo 50 mg tablet Take 1 tablet (50 mg total) by mouth daily. For Investigational Use Only. Take at the same time each day (preferably in the morning). Tablet should be swallowed  whole with water; it CANNOT be crushed or broken.   oxyCODONE 5 MG immediate release tablet Commonly known as: Oxy IR/ROXICODONE Take 5 mg by mouth daily as needed for severe pain.        Follow-up Information     [facility] Follow up.   Specialty: Rehabilitation Why: The outpatient therapy will contact you for the first appointment Contact information: [employer] [id number] [place] [phone]        [facility]. Schedule an appointment as soon as possible for a visit in 1 month(s).   Specialty: Neurology Why: stroke clinic Contact information: [place], [place] an appointment as soon as possible for a visit in 1 week(s).   Contact information: [place] [address] [phone]                Disposition: Home Diet recommendation: Cardiac diet  Discharge Exam: Vitals:   02/05/22 2300 02/05/22 2355 02/06/22 0150 02/06/22 1500  BP: 109/89 121/82 117/74 (!) 145/70  Pulse: 65   69  Resp: '12 13 13 16  '$ Temp:  98.6 F (37 C)  98.3 F (36.8 C)  TempSrc:  Oral  Oral  SpO2: 95% 96% 96% 100%  Weight:      Height:       General: Appear in no distress; no visible Abnormal Neck Mass Or lumps, Conjunctiva normal Cardiovascular: S1 and S2 Present, no Murmur, Respiratory: good respiratory effort, Bilateral Air entry present and CTA, no Crackles, no wheezes Abdomen: Bowel Sound present, Non tender Extremities: no Pedal edema Neurology: alert and oriented to time, place, and person Gait not checked due to Clinton safety concerns Filed Weights   02/05/22 0929  Weight: 78.5 kg   Condition at discharge: stable  The results of significant diagnostics from this hospitalization (including imaging, microbiology, ancillary and laboratory) are listed below for  reference.   Imaging Studies: CT Head Wo Contrast  Result Date: 02/05/2022 CLINICAL DATA:  Slurred speech. EXAM: CT HEAD WITHOUT CONTRAST TECHNIQUE: Contiguous axial images were obtained from the base of the skull through the vertex without intravenous contrast. RADIATION DOSE REDUCTION: This exam was performed according to the departmental dose-optimization program which includes automated exposure control, adjustment of the mA and/or kV according to Clinton size and/or use of iterative reconstruction technique. COMPARISON:  None Available. FINDINGS: Brain: No evidence of acute infarction, hemorrhage, hydrocephalus, extra-axial collection or mass lesion/mass effect. Vascular: No hyperdense vessel or unexpected calcification. Skull: Normal. Negative for fracture or focal lesion. Sinuses/Orbits: No acute finding. Other: None. IMPRESSION: No acute intracranial abnormality seen. Electronically Signed   By: Marijo Conception M.D.   On: 02/05/2022 10:45   MR ANGIO HEAD WO CONTRAST  Result Date: 02/05/2022 CLINICAL DATA:  Transient ischemic attack EXAM: MRI HEAD WITHOUT CONTRAST MRA HEAD WITHOUT CONTRAST TECHNIQUE: Multiplanar, multi-echo pulse sequences of the brain and  surrounding structures were acquired without intravenous contrast. Angiographic images of the Circle of Willis were acquired using MRA technique without intravenous contrast. COMPARISON:  None Available. FINDINGS: MRI HEAD Brain: Small foci variably reduced diffusion are present in the right frontal much greater than parietal and occipital lobes. Ventricles and sulci are within normal limits in size and configuration. Minimal susceptibility in the right occipital lobe may reflect chronic blood products. No intracranial mass or mass effect. No hydrocephalus or extra-axial collection. Vascular: Major vessel flow voids at the skull base are preserved. Skull and upper cervical spine: Normal marrow signal is preserved. Sinuses/Orbits: Paranasal sinuses  are aerated. Orbits are unremarkable. Other: Sella is unremarkable.  Mastoid air cells are clear. MRA HEAD Motion artifact is present. There is minimal flow related enhancement of the intracranial right internal carotid artery. Partial reconstitution near the terminus. Left intracranial internal carotid is patent with atherosclerotic irregularity. Anterior and middle cerebral arteries are patent. Flow related enhancement appears relatively diminished in the right MCA territory. Anterior communicating artery appears to be present. Included intracranial vertebral arteries are patent. Basilar artery is patent. Posterior cerebral arteries are patent. Posterior communicating arteries are not identified. IMPRESSION: Small foci of acute and subacute infarction in the right frontal lobe and to a lesser extent parietal and occipital lobes. There is abnormal appearance of the visualized right internal carotid artery, which may reflect high-grade stenosis more proximally. Small chronic right occipital infarct. Electronically Signed   By: Macy Mis M.D.   On: 02/05/2022 13:06   MR BRAIN WO CONTRAST  Result Date: 02/05/2022 CLINICAL DATA:  Transient ischemic attack EXAM: MRI HEAD WITHOUT CONTRAST MRA HEAD WITHOUT CONTRAST TECHNIQUE: Multiplanar, multi-echo pulse sequences of the brain and surrounding structures were acquired without intravenous contrast. Angiographic images of the Circle of Willis were acquired using MRA technique without intravenous contrast. COMPARISON:  None Available. FINDINGS: MRI HEAD Brain: Small foci variably reduced diffusion are present in the right frontal much greater than parietal and occipital lobes. Ventricles and sulci are within normal limits in size and configuration. Minimal susceptibility in the right occipital lobe may reflect chronic blood products. No intracranial mass or mass effect. No hydrocephalus or extra-axial collection. Vascular: Major vessel flow voids at the skull base  are preserved. Skull and upper cervical spine: Normal marrow signal is preserved. Sinuses/Orbits: Paranasal sinuses are aerated. Orbits are unremarkable. Other: Sella is unremarkable.  Mastoid air cells are clear. MRA HEAD Motion artifact is present. There is minimal flow related enhancement of the intracranial right internal carotid artery. Partial reconstitution near the terminus. Left intracranial internal carotid is patent with atherosclerotic irregularity. Anterior and middle cerebral arteries are patent. Flow related enhancement appears relatively diminished in the right MCA territory. Anterior communicating artery appears to be present. Included intracranial vertebral arteries are patent. Basilar artery is patent. Posterior cerebral arteries are patent. Posterior communicating arteries are not identified. IMPRESSION: Small foci of acute and subacute infarction in the right frontal lobe and to a lesser extent parietal and occipital lobes. There is abnormal appearance of the visualized right internal carotid artery, which may reflect high-grade stenosis more proximally. Small chronic right occipital infarct. Electronically Signed   By: Macy Mis M.D.   On: 02/05/2022 13:06   ECHOCARDIOGRAM COMPLETE  Result Date: 02/06/2022    ECHOCARDIOGRAM REPORT   Clinton Ross Date of Exam: 02/06/2022 Medical Rec #:  [id number]    Height:       70.0 in Accession #:  [id number]   Weight:       173.0 lb Date of Birth:  04-02-1966    BSA:          1.963 m Clinton Age:    41 years     BP:           117/74 mmHg Clinton Gender: M            HR:           62 bpm. Exam Location:  Inpatient Procedure: 2D Echo, Cardiac Doppler and Color Doppler Indications:    Stroke I63.9  History:        Clinton has no prior history of Echocardiogram examinations.                 Risk Factors:Hypertension.  Sonographer:    Bernadene Person RDCS Referring Phys: [id number] [facility]  Sonographer Comments: Image acquisition  challenging due to respiratory motion. IMPRESSIONS  1. Left ventricular ejection fraction, by estimation, is 55 to 60%. The left ventricle has normal function. The left ventricle has no regional wall motion abnormalities. Left ventricular diastolic parameters were normal.  2. Right ventricular systolic function is normal. The right ventricular size is normal. Tricuspid regurgitation signal is inadequate for assessing PA pressure.  3. The mitral valve is normal in structure. Trivial mitral valve regurgitation. No evidence of mitral stenosis.  4. The aortic valve is normal in structure. Aortic valve regurgitation is not visualized. No aortic stenosis is present.  5. Aortic dilatation noted. There is borderline dilatation of the ascending aorta, measuring 39 mm.  6. The inferior vena cava is normal in size with <50% respiratory variability, suggesting right atrial pressure of 8 mmHg. Comparison(s): No prior Echocardiogram. Conclusion(s)/Recommendation(s): Normal biventricular function without evidence of hemodynamically significant valvular heart disease. No intracardiac source of embolism detected on this transthoracic study. Consider a transesophageal echocardiogram to exclude cardiac source of embolism if clinically indicated. FINDINGS  Left Ventricle: Left ventricular ejection fraction, by estimation, is 55 to 60%. The left ventricle has normal function. The left ventricle has no regional wall motion abnormalities. The left ventricular internal cavity size was normal in size. There is  no left ventricular hypertrophy. Left ventricular diastolic parameters were normal. Right Ventricle: The right ventricular size is normal. No increase in right ventricular wall thickness. Right ventricular systolic function is normal. Tricuspid regurgitation signal is inadequate for assessing PA pressure. Left Atrium: Left atrial size was normal in size. Right Atrium: Right atrial size was normal in size. Pericardium: There is no  evidence of pericardial effusion. Mitral Valve: The mitral valve is normal in structure. Trivial mitral valve regurgitation. No evidence of mitral valve stenosis. Tricuspid Valve: The tricuspid valve is normal in structure. Tricuspid valve regurgitation is not demonstrated. No evidence of tricuspid stenosis. Aortic Valve: The aortic valve is normal in structure. Aortic valve regurgitation is not visualized. No aortic stenosis is present. Pulmonic Valve: The pulmonic valve was grossly normal. Pulmonic valve regurgitation is not visualized. No evidence of pulmonic stenosis. Aorta: Aortic dilatation noted. There is borderline dilatation of the ascending aorta, measuring 39 mm. Venous: The inferior vena cava is normal in size with less than 50% respiratory variability, suggesting right atrial pressure of 8 mmHg. IAS/Shunts: The atrial septum is grossly normal.  LEFT VENTRICLE PLAX 2D LVIDd:         4.40 cm      Diastology LVIDs:         2.70 cm  LV e' medial:    6.39 cm/s LV PW:         1.10 cm      LV E/e' medial:  12.1 LV IVS:        0.90 cm      LV e' lateral:   9.49 cm/s LVOT diam:     2.20 cm      LV E/e' lateral: 8.2 LV SV:         86 LV SV Index:   44 LVOT Area:     3.80 cm  LV Volumes (MOD) LV vol d, MOD A2C: 83.6 ml LV vol d, MOD A4C: 104.0 ml LV vol s, MOD A2C: 41.7 ml LV vol s, MOD A4C: 42.9 ml LV SV MOD A2C:     41.9 ml LV SV MOD A4C:     104.0 ml LV SV MOD BP:      50.8 ml RIGHT VENTRICLE RV S prime:     12.20 cm/s TAPSE (M-mode): 2.5 cm LEFT ATRIUM             Index        RIGHT ATRIUM           Index LA diam:        2.90 cm 1.48 cm/m   RA Area:     14.30 cm LA Vol (A2C):   35.2 ml 17.94 ml/m  RA Volume:   34.40 ml  17.53 ml/m LA Vol (A4C):   31.2 ml 15.90 ml/m LA Biplane Vol: 33.7 ml 17.17 ml/m  AORTIC VALVE LVOT Vmax:   99.40 cm/s LVOT Vmean:  64.500 cm/s LVOT VTI:    0.225 m  AORTA Ao Root diam: 4.20 cm Ao Asc diam:  3.90 cm MITRAL VALVE MV Area (PHT): 3.72 cm    SHUNTS MV Decel Time: 204  msec    Systemic VTI:  0.22 m MV E velocity: 77.50 cm/s  Systemic Diam: 2.20 cm MV A velocity: 70.60 cm/s MV E/A ratio:  1.10 Buford Dresser MD Electronically signed by Buford Dresser MD Signature Date/Time: 02/06/2022/11:28:27 AM    Final    CT ANGIO HEAD NECK W WO CM W PERF  Result Date: 02/05/2022 CLINICAL DATA:  Slurred speech EXAM: CT ANGIOGRAPHY HEAD AND NECK CT PERFUSION BRAIN TECHNIQUE: Multidetector CT imaging of the head and neck was performed using the standard protocol during bolus administration of intravenous contrast. Multiplanar CT image reconstructions and MIPs were obtained to evaluate the vascular anatomy. Carotid stenosis measurements (when applicable) are obtained utilizing NASCET criteria, using the distal internal carotid diameter as the denominator. Multiphase CT imaging of the brain was performed following IV bolus contrast injection. Subsequent parametric perfusion maps were calculated using RAPID software. RADIATION DOSE REDUCTION: This exam was performed according to the departmental dose-optimization program which includes automated exposure control, adjustment of the mA and/or kV according to Clinton size and/or use of iterative reconstruction technique. CONTRAST:  176m OMNIPAQUE IOHEXOL 350 MG/ML SOLN COMPARISON:  02/05/2022 brain MRI FINDINGS: CT HEAD FINDINGS Brain: There is no mass, hemorrhage or extra-axial collection. The size and configuration of the ventricles and extra-axial CSF spaces are normal. There is hypoattenuation of the periventricular white matter, most commonly indicating chronic ischemic microangiopathy. Skull: The visualized skull base, calvarium and extracranial soft tissues are normal. Sinuses/Orbits: No fluid levels or advanced mucosal thickening of the visualized paranasal sinuses. No mastoid or middle ear effusion. The orbits are normal. CTA NECK FINDINGS SKELETON: There is no bony spinal canal stenosis.  No lytic or blastic lesion. OTHER  NECK: Normal pharynx, larynx and major salivary glands. No cervical lymphadenopathy. Unremarkable thyroid gland. UPPER CHEST: No pneumothorax or pleural effusion. No nodules or masses. AORTIC ARCH: There is no calcific atherosclerosis of the aortic arch. There is no aneurysm, dissection or hemodynamically significant stenosis of the visualized portion of the aorta. Conventional 3 vessel aortic branching pattern. The visualized proximal subclavian arteries are widely patent. RIGHT CAROTID SYSTEM: The right internal carotid artery is completely occluded at its origin and remains occluded along its entire length. LEFT CAROTID SYSTEM: No dissection, occlusion or aneurysm. Mild atherosclerosis at the carotid bifurcation without hemodynamically significant stenosis. VERTEBRAL ARTERIES: Left dominant configuration. Both origins are clearly patent. There is no dissection, occlusion or flow-limiting stenosis to the skull base (V1-V3 segments). CTA HEAD FINDINGS POSTERIOR CIRCULATION: --Vertebral arteries: Normal V4 segments. --Inferior cerebellar arteries: Normal. --Basilar artery: Normal. --Superior cerebellar arteries: Normal. --Posterior cerebral arteries (PCA): Normal. ANTERIOR CIRCULATION: --Intracranial internal carotid arteries: Right ICA is occluded. Calcific atherosclerosis at the left ICA at the skull base with mild stenosis. --Anterior cerebral arteries (ACA): Normal. Both A1 segments are present. Patent anterior communicating artery (a-comm). --Middle cerebral arteries (MCA): Normal. VENOUS SINUSES: As permitted by contrast timing, patent. ANATOMIC VARIANTS: None Review of the MIP images confirms the above findings. CT Brain Perfusion Findings: ASPECTS: 10 CBF (<30%) Volume: 29m Perfusion (Tmax>6.0s) volume: 1065mMismatch Volume: 10484mnfarction Location:Right MCA territory area of ischemic penumbra. No core infarct by perfusion criteria. IMPRESSION: 1. Complete occlusion of the right internal carotid artery  along its entire course. 2. There is 104 mL area of ischemic penumbra within the right MCA territory. No core infarct by perfusion criteria. 3. No intracranial arterial occlusion or high-grade stenosis. Electronically Signed   By: KevUlyses JarredD.   On: 02/05/2022 23:22    Microbiology: Results for orders placed or performed during the hospital encounter of 10/05/21  Covid-19, Flu A+B ([facility])     Status: Abnormal   Collection Time: 10/05/21  9:43 AM   Specimen: Nasopharyngeal Swab   Naso  Result Value Ref Range Status   SARS-CoV-2, NAA Not Detected Not Detected Final   Influenza A, NAA Detected (A) Not Detected Final   Influenza B, NAA Not Detected Not Detected Final   Test Information: Comment  Final    Comment: This nucleic acid amplification test was developed and its performance characteristics determined by Lab[employer], [employer] and Companyucleic acid amplification tests include RT-PCR and TMA. This test has not been FDA cleared or approved. This test has been authorized by FDA under an Emergency Use Authorization (EUA). This test is only authorized for the duration of time the declaration that circumstances exist justifying the authorization of the emergency use of in vitro diagnostic tests for detection of SARS-CoV-2 virus and/or diagnosis of COVID-19 infection under section 564(b)(1) of the Act, 21 U.S.C. 360947MLY-6(T1), unless the authorization is terminated or revoked sooner. When diagnostic testing is negative, the possibility of a false negative result should be considered in the context of a Clinton's recent exposures and the presence of clinical signs and symptoms consistent with COVID-19. An individual without symptoms of COVID-19 and who is not shedding SARS-CoV-2 virus wo uld expect to have a negative (not detected) result in this assay.     Labs: CBC: Recent Labs  Lab 02/05/22 1006 02/05/22 1822  WBC 7.5 6.6  NEUTROABS 5.8  --   HGB 16.5 15.8  HCT 49.6 46.2   MCV 87.2 85.9  PLT 215 943   Basic Metabolic Panel: Recent Labs  Lab 02/05/22 1006  NA 138  K 3.8  CL 106  CO2 26  GLUCOSE 88  BUN 12  CREATININE 1.03  CALCIUM 9.1   Liver Function Tests: No results for input(s): AST, ALT, ALKPHOS, BILITOT, PROT, ALBUMIN in the last 168 hours. CBG: Recent Labs  Lab 02/05/22 0927  GLUCAP 106*    Discharge time spent: greater than 30 minutes.  Signed: Berle Mull, MD [facility] 02/06/2022

## 2022-02-15 ENCOUNTER — Ambulatory Visit (HOSPITAL_COMMUNITY): Payer: Medicare PPO | Attending: Internal Medicine

## 2022-02-15 ENCOUNTER — Telehealth (HOSPITAL_COMMUNITY): Payer: Self-pay

## 2022-02-15 NOTE — Therapy (Incomplete)
OUTPATIENT PHYSICAL THERAPY NEURO EVALUATION   Patient Name: Clinton Ross MRN: [id number] DOB:[birth year], 56 y.o., male Today's Date: 02/15/2022   PCP: Marland Kitchen REFERRING PROVIDER: ***    Past Medical History:  Diagnosis Date   Acute pancreatitis 10/2013   Arthritis    Bulging disc    Cancer ([place])    Colon   Chronic knee pain    Colostomy in place [facility])    Gallstones    Hypertension    PTSD (post-traumatic stress disorder)    Sciatica    Past Surgical History:  Procedure Laterality Date   CHOLECYSTECTOMY     COLON SURGERY     rectal cancer Right    Patient Active Problem List   Diagnosis Date Noted   Internal carotid artery stenosis, right 02/08/2022   Hyperlipidemia 02/08/2022   Marijuana abuse 02/08/2022   Acute ischemic stroke ([place]) 02/05/2022   Pancreatitis 06/08/2018   Chest pain 07/21/2016   Cholelithiasis 11/28/2013   Tobacco abuse 11/28/2013   Abdominal pain 11/27/2013   Acute pancreatitis 11/20/2013   Constipation 11/20/2013   Essential hypertension 11/20/2013   PTSD (post-traumatic stress disorder) 11/20/2013   Sciatica 05/24/2009   DERANGEMENT MENISCUS 10/24/2008   JOINT EFFUSION, KNEE 10/24/2008   KNEE PAIN 10/24/2008    ONSET DATE: ***  REFERRING DIAG: I63.9 (ICD-10-CM) - Cerebrovascular accident (CVA), unspecified mechanism ([place])   THERAPY DIAG:  No diagnosis found.  Rationale for Evaluation and Treatment Rehabilitation  SUBJECTIVE:                                                                                                                                                                                              SUBJECTIVE STATEMENT: Patient experienced CVA on *** affecting right frontal, parietal and occipital lobes.  Pt accompanied by: {accompnied:27141}  PERTINENT HISTORY:  -Acute right scattered acute and subacute infarcts in right frontal, parietal and occipital lobes infarct  likely secondary due to right ICA occlusion  -CT  head followed by MRI brain and MR angiogram of the head and was found to have acute and subacute infarction in the right frontal lobe and to parietal and occipital lobes.  Possible high-grade stenosis of proximal right internal carotid artery.   -history significant of hypertension, adenocarcinoma of the rectum, chronic back pain   PAIN:  Are you having pain? {OPRCPAIN:27236}  PRECAUTIONS: {Therapy precautions:24002}  WEIGHT BEARING RESTRICTIONS {Yes ***/No:24003}  FALLS: Has patient fallen in last 6 months? {fallsyesno:27318}  LIVING ENVIRONMENT: Lives with: {OPRC lives with:25569::"lives with their family"} Lives in: {Lives in:25570} Stairs: {opstairs:27293} Has following equipment at home: {Assistive devices:23999}  PLOF: {PLOF:24004}  PATIENT GOALS ***  OBJECTIVE:   DIAGNOSTIC FINDINGS:  IMPRESSION: 1. Complete occlusion of the right internal carotid artery along its entire course. 2. There is 104 mL area of ischemic penumbra within the right MCA territory. No core infarct by perfusion criteria. 3. No intracranial arterial occlusion or high-grade stenosis.  COGNITION: Overall cognitive status: {cognition:24006}   SENSATION: {sensation:27233}  COORDINATION: ***  EDEMA:  {edema:24020}  MUSCLE TONE: {LE tone:25568}   MUSCLE LENGTH: Hamstrings: Right *** deg; Left *** deg Marcello Moores test: Right *** deg; Left *** deg  DTRs:  {DTR SITE:24025}  POSTURE: {posture:25561}  LOWER EXTREMITY ROM:     {AROM/PROM:27142}  Right Eval Left Eval  Hip flexion    Hip extension    Hip abduction    Hip adduction    Hip internal rotation    Hip external rotation    Knee flexion    Knee extension    Ankle dorsiflexion    Ankle plantarflexion    Ankle inversion    Ankle eversion     (Blank rows = not tested)  LOWER EXTREMITY MMT:    MMT Right Eval Left Eval  Hip flexion    Hip extension    Hip abduction    Hip adduction    Hip internal rotation    Hip  external rotation    Knee flexion    Knee extension    Ankle dorsiflexion    Ankle plantarflexion    Ankle inversion    Ankle eversion    (Blank rows = not tested)  BED MOBILITY:  {Bed mobility:24027}  TRANSFERS: Assistive device utilized: {Assistive devices:23999}  Sit to stand: {Levels of assistance:24026} Stand to sit: {Levels of assistance:24026} Chair to chair: {Levels of assistance:24026} Floor: {Levels of assistance:24026}  RAMP:  Level of Assistance: {Levels of assistance:24026} Assistive device utilized: {Assistive devices:23999} Ramp Comments: ***  CURB:  Level of Assistance: {Levels of assistance:24026} Assistive device utilized: {Assistive devices:23999} Curb Comments: ***  STAIRS:  Level of Assistance: {Levels of assistance:24026}  Stair Negotiation Technique: {Stair Technique:27161} with {Rail Assistance:27162}  Number of Stairs: ***   Height of Stairs: ***  Comments: ***  GAIT: Gait pattern: {gait characteristics:25376} Distance walked: *** Assistive device utilized: {Assistive devices:23999} Level of assistance: {Levels of assistance:24026} Comments: ***  FUNCTIONAL TESTs:  {Functional tests:24029}  PATIENT SURVEYS:  {rehab surveys:24030}  TODAY'S TREATMENT:  evaluation    PATIENT EDUCATION:  Education details: Patient educated on exam findings, POC, scope of PT, HEP, and ***. Person educated: Patient Education method: Explanation, Demonstration, and Handouts Education comprehension: verbalized understanding, returned demonstration, verbal cues required, and tactile cues required    HOME EXERCISE PROGRAM: ***    GOALS: Goals reviewed with patient? {yes/no:20286}  SHORT TERM GOALS: Target date: {follow up:25551}  Patient will be independent with HEP in order to improve functional outcomes. Baseline: *** Goal status: {GOALSTATUS:25110}  2.  Patient will report at least 25% improvement in symptoms for improved quality of  life. Baseline: *** Goal status: {GOALSTATUS:25110}  3.  *** Baseline: *** Goal status: {GOALSTATUS:25110}  4.  *** Baseline: *** Goal status: {GOALSTATUS:25110}  5.  *** Baseline: *** Goal status: {GOALSTATUS:25110}  6.  *** Baseline: *** Goal status: {GOALSTATUS:25110}  LONG TERM GOALS: Target date: {follow up:25551}  Patient will report at least 75% improvement in symptoms for improved quality of life. Baseline: *** Goal status: {GOALSTATUS:25110}  2.  Patient will be able to complete 5x STS in under 11.4 seconds in order to reduce the risk of falls.  Baseline: *** Goal status: {GOALSTATUS:25110}  3.  Patient will be able to ambulate at least *** feet in 2MWT in order to demonstrate improved gait speed for community ambulation.  Baseline: *** Goal status: {GOALSTATUS:25110}  4.  Patient will score at least ***/24 on DGI in order to demonstrate improved balance to reduce the risk for falls at home and in the community.  Baseline: *** Goal status: {GOALSTATUS:25110}  5.  Patient will be able to navigate stairs with reciprocal pattern without compensation in order to demonstrate improved LE strength. Baseline: *** Goal status: {GOALSTATUS:25110}  6.  *** Baseline: *** Goal status: {GOALSTATUS:25110}   ASSESSMENT:  CLINICAL IMPRESSION: Patient a 56 y.o. y.o. male who was seen today for physical therapy evaluation and treatment for CVA. ***.     OBJECTIVE IMPAIRMENTS {opptimpairments:25111}.   ACTIVITY LIMITATIONS {activitylimitations:27494}  PARTICIPATION LIMITATIONS: {participationrestrictions:25113}  PERSONAL FACTORS {Personal factors:25162} are also affecting patient's functional outcome.   REHAB POTENTIAL: {rehabpotential:25112}  CLINICAL DECISION MAKING: {clinical decision making:25114}  EVALUATION COMPLEXITY: {Evaluation complexity:25115}  PLAN: PT FREQUENCY: {rehab frequency:25116}  PT DURATION: {rehab duration:25117}  PLANNED  INTERVENTIONS: PLANNED INTERVENTIONS: Therapeutic exercises, Therapeutic activity, Neuromuscular re-education, Balance training, Gait training, Patient/Family education, Joint manipulation, Joint mobilization, Stair training, Orthotic/Fit training, DME instructions, Aquatic Therapy, Dry Needling, Electrical stimulation, Spinal manipulation, Spinal mobilization, Cryotherapy, Moist heat, Compression bandaging, scar mobilization, Splintting, Taping, Traction, Ultrasound, Ionotophoresis '4mg'$ /ml Dexamethasone, and Manual therapy  PLAN FOR NEXT SESSION: ***   Kyndel Egger, PT 02/15/2022, 8:38 AM

## 2022-02-15 NOTE — Telephone Encounter (Signed)
Patient did l/m to cx today's appointment due to needing more test.

## 2022-03-12 ENCOUNTER — Ambulatory Visit (HOSPITAL_COMMUNITY): Payer: Medicare PPO | Attending: Internal Medicine | Admitting: Occupational Therapy

## 2022-03-27 NOTE — Progress Notes (Unsigned)
Patient: Clinton Ross Date of Birth: 03/21/1966  Reason for Visit: Follow up for stroke 02/05/22 History from: Patient Primary Neurologist: Saw Dr. Erlinda Hong  ASSESSMENT AND PLAN 56 y.o. year old male   1.  Stroke, scattered right acute and subacute infarcts in the right frontal, parietal, occipital lobes likely secondary due to right ICA occlusion -Aspirin 81 mg daily, Plavix 75 mg daily for 3 months, then Plavix alone given right ICA occlusion -Enrolled in [place] trial   2.  Right ICA occlusion -No surgical indication, Continue medical management, has had vascular consult, is not a candidate for intervention  3.  Hypertension -At goal, Goal less than 130/90 -On amlodipine 10 mg daily  4.  Hyperlipidemia -LDL 109, goal less than 70 -On atorvastatin 40 mg daily  5.  Tobacco abuse -Working on this, Cut way back, down to 5 cigarettes daily, he is motivated   Go to the ER for any acute stroke symptoms.  Continue close follow-up with PCP/VA for secondary stroke prevention. He will have follow-up at our office with research for his participation in the [facility] trial.   HISTORY OF PRESENT ILLNESS: Today 03/28/22 Clinton Ross here today for stroke clinic follow-up from event 02/05/22 presents today for sudden onset aphasia and dysarthria resolved in 5 minutes.  Presented the day before with transient left arm numbness and tingling.  A week before had blurry vision in the right eye. MRI of the brain showed acute and subacute infarcts in the right frontal, parietal, occipital lobes.  Complete occlusion of right ICA.  Here today alone, reports vision in right eye is improved, 60% better. Speech is normal, no weakness. Has chronic back pain that is better actually, taking less oxycodone. Has colostomy from history of colon cancer. Has lost about 10 lbs since discharge, doesn't have much appetite. Lives with wife, and his step kids. He does door dash, he likes fishing. Goes to the [place]. [facility]  [facility] 1 month ago, reports still 100% blocked, this was the surgeon who performed his colostomy. Cut back on smoking. No concerns today or questions.   -CT head showed no acute abnormality -CTA head and neck showed complete occlusion of right ICA -CT perfusion 104 penumbra, no core -MRI showed small infarcts in the right frontal, parietal, occipital lobes -2D echo EF 55 to 60% -LDL 109 -A1c 5.5 -On aspirin 81 mg daily prior to admission. 3 months of aspirin 81 mg daily and Plavix 75 mg daily, then Plavix alone -Enrolled in [place] trial  -UDS positive for [facility]  HISTORY  Copied 02/05/22 Dr. Cheral Marker: Clinton Ross is an 56 y.o. male who [facility] this AM for evaluation of TIA symptoms x 2, consisting of transient LUE numbness yesterday, followed by transient aphasia this morning. The first episode occurred while in the shower and lasted for about 3 minutes with numbness of the entire arm but no weakness or other neurological symptoms at that time. The second episode was witnessed by his wife during which he "could not get my words out", lasting about 4-5 minutes prior to spontaneous resolution. He also had lightheadedness and dizziness at the time, which made him unable to stand. He has a PMHx significant for arthritis, colon CA s/p resection and chemotherapy, HTN, PTSD, sciatica and PTSD. He also smokes a little under a pack per day and has smoked since about the age of 66.    He also endorses onset of decreased visual acuity  described as "hazy" in his right eye starting about 3 weeks ago. He went to Ophthalmology about 1 week ago and had retinal imaging revealing evidence for poor blood flow to his right retina. HgbA1c drawn by the Ophthalmologist was normal, per patient. He has other blood work pending.    He takes ASA on a regular basis, but only about 3 times per week.  REVIEW OF SYSTEMS: Out of a complete 14 system review of symptoms, the patient complains only of the  following symptoms, and all other reviewed systems are negative.  See HPI  ALLERGIES: Allergies  Allergen Reactions   Peanut-Containing Drug Products Itching    Walnuts   Morphine And Related     Altered mental status-burning sensation all over    HOME MEDICATIONS: Outpatient Medications Prior to Visit  Medication Sig Dispense Refill   amLODipine (NORVASC) 5 MG tablet Take 1 tablet (5 mg total) by mouth daily. (Patient taking differently: Take 10 mg by mouth daily.) 30 tablet 0   aspirin 81 MG chewable tablet Chew 1 tablet (81 mg total) by mouth daily. (Patient taking differently: Chew 81 mg by mouth 3 (three) times a week.) 30 tablet 0   atorvastatin (LIPITOR) 40 MG tablet Take 1 tablet (40 mg total) by mouth daily. 30 tablet 0   clopidogrel (PLAVIX) 75 MG tablet Take 1 tablet (75 mg total) by mouth daily. 30 tablet 0   Study - OCEANIC-STROKE - asundexian 50 mg or placebo tablet (PI-Sethi) Take 1 tablet (50 mg total) by mouth daily. For Investigational Use Only. Take at the same time each day (preferably in the morning). Tablet should be swallowed whole with water; it CANNOT be crushed or broken. 98 tablet 0   cetirizine (ZYRTEC) 10 MG tablet Take 10 mg by mouth daily.     cyclobenzaprine (FLEXERIL) 10 MG tablet Take 1 tablet (10 mg total) by mouth at bedtime. 15 tablet 0   fluticasone (FLONASE) 50 MCG/ACT nasal spray Place 1 spray into both nostrils 2 (two) times daily. 16 g 0   nitroGLYCERIN (NITROSTAT) 0.4 MG SL tablet Place 1 tablet (0.4 mg total) under the tongue every 5 (five) minutes as needed for chest pain. 30 tablet 0   oxyCODONE (OXY IR/ROXICODONE) 5 MG immediate release tablet Take 5 mg by mouth daily as needed for severe pain.     No facility-administered medications prior to visit.    PAST MEDICAL HISTORY: Past Medical History:  Diagnosis Date   Acute pancreatitis 10/2013   Arthritis    Bulging disc    Cancer ([facility])    Colon   Chronic knee pain    Colostomy in  place ([place])    Gallstones    Hypertension    PTSD (post-traumatic stress disorder)    Sciatica     PAST SURGICAL HISTORY: Past Surgical History:  Procedure Laterality Date   CHOLECYSTECTOMY     COLON SURGERY     rectal cancer Right     FAMILY HISTORY: Family History  Problem Relation Age of Onset   Hypertension Mother    Stroke Father    Diabetes Other     SOCIAL HISTORY: Social History   Socioeconomic History   Marital status: Married    Spouse name: Not on file   Number of children: Not on file   Years of education: Not on file   Highest education level: Not on file  Occupational History   Not on file  Tobacco Use   Smoking status: Every  Day    Packs/day: 0.50    Years: 30.00    Total pack years: 15.00    Types: Cigarettes   Smokeless tobacco: Never  Vaping Use   Vaping Use: Never used  Substance and Sexual Activity   Alcohol use: No   Drug use: No   Sexual activity: Not on file  Other Topics Concern   Not on file  Social History Narrative   Not on file   Social Determinants of Health   Financial Resource Strain: Not on file  Food Insecurity: Not on file  Transportation Needs: Not on file  Physical Activity: Not on file  Stress: Not on file  Social Connections: Not on file  Intimate Partner Violence: Not on file    PHYSICAL EXAM  Vitals:   03/28/22 0755  BP: 117/76  Pulse: 73  Weight: 163 lb 8 oz (74.2 kg)  Height: '5\' 10"'$  (1.778 m)   Body mass index is 23.46 kg/m.  Generalized: Well developed, in no acute distress  Neurological examination  Mentation: Alert oriented to time, place, history taking. Follows all commands speech and language fluent Cranial nerve II-XII: Pupils were equal round reactive to light. Extraocular movements were full, visual field were full on confrontational test. Facial sensation and strength were normal. Head turning and shoulder shrug were normal and symmetric. Motor: The motor testing reveals 5 over 5  strength of all 4 extremities. Good symmetric motor tone is noted throughout.  Sensory: Sensory testing is intact to soft touch on all 4 extremities. No evidence of extinction is noted.  Coordination: Cerebellar testing reveals good finger-nose-finger and heel-to-shin bilaterally.  Gait and station: Gait is normal. Tandem gait is slightly unsteady.  Romberg is negative. No drift is seen.  Reflexes: Deep tendon reflexes are symmetric and normal bilaterally.   DIAGNOSTIC DATA (LABS, IMAGING, TESTING) - I reviewed patient records, labs, notes, testing and imaging myself where available.  Lab Results  Component Value Date   WBC 6.6 02/05/2022   HGB 15.8 02/05/2022   HCT 46.2 02/05/2022   MCV 85.9 02/05/2022   PLT 204 02/05/2022      Component Value Date/Time   NA 138 02/05/2022 1006   NA 141 10/11/2021 1651   K 3.8 02/05/2022 1006   CL 106 02/05/2022 1006   CO2 26 02/05/2022 1006   GLUCOSE 88 02/05/2022 1006   BUN 12 02/05/2022 1006   BUN 14 10/11/2021 1651   CREATININE 1.03 02/05/2022 1006   CALCIUM 9.1 02/05/2022 1006   PROT 7.2 10/16/2021 1110   PROT 6.1 10/11/2021 1651   ALBUMIN 3.7 10/16/2021 1110   ALBUMIN 4.1 10/11/2021 1651   AST 16 10/16/2021 1110   ALT 17 10/16/2021 1110   ALKPHOS 78 10/16/2021 1110   BILITOT 0.9 10/16/2021 1110   BILITOT 0.3 10/11/2021 1651   GFRNONAA >60 02/05/2022 1006   GFRAA >60 11/28/2019 0933   Lab Results  Component Value Date   CHOL 152 02/06/2022   HDL 23 (L) 02/06/2022   LDLCALC 109 (H) 02/06/2022   TRIG 98 02/06/2022   CHOLHDL 6.6 02/06/2022   Lab Results  Component Value Date   HGBA1C 5.5 02/05/2022   No results found for: "[id number]" Lab Results  Component Value Date   TSH 3.803 11/20/2013    Butler Denmark, AGNP-C, DNP 03/28/2022, 8:26 AM [facility] [address] [place], [place] [address] [phone]

## 2022-03-28 ENCOUNTER — Ambulatory Visit (INDEPENDENT_AMBULATORY_CARE_PROVIDER_SITE_OTHER): Payer: No Typology Code available for payment source | Admitting: Neurology

## 2022-03-28 VITALS — BP 117/76 | HR 73 | Ht 70.0 in | Wt 163.5 lb

## 2022-03-28 DIAGNOSIS — I639 Cerebral infarction, unspecified: Secondary | ICD-10-CM | POA: Diagnosis not present

## 2022-03-28 DIAGNOSIS — I10 Essential (primary) hypertension: Secondary | ICD-10-CM

## 2022-03-28 DIAGNOSIS — I6521 Occlusion and stenosis of right carotid artery: Secondary | ICD-10-CM

## 2022-03-28 NOTE — Patient Instructions (Signed)
You should be on aspirin and Plavix for total of 3 months, then Plavix alone Keep BP < 130/80 LDL < 70 You are enrolled in the [facility] Trial, keep this appointment in August Continue to see your PCP Return here as needed

## 2022-03-28 NOTE — Progress Notes (Signed)
I agree with the above plan 

## 2022-04-24 DIAGNOSIS — G8929 Other chronic pain: Secondary | ICD-10-CM | POA: Diagnosis not present

## 2022-04-24 DIAGNOSIS — M5442 Lumbago with sciatica, left side: Secondary | ICD-10-CM | POA: Diagnosis not present

## 2022-06-24 DIAGNOSIS — B338 Other specified viral diseases: Secondary | ICD-10-CM | POA: Diagnosis not present

## 2022-08-09 ENCOUNTER — Emergency Department (HOSPITAL_COMMUNITY): Payer: No Typology Code available for payment source

## 2022-08-09 ENCOUNTER — Emergency Department (HOSPITAL_COMMUNITY)
Admission: EM | Admit: 2022-08-09 | Discharge: 2022-08-09 | Disposition: A | Discharge status: Home / Self Care | Payer: No Typology Code available for payment source | Attending: Emergency Medicine | Admitting: Emergency Medicine

## 2022-08-09 DIAGNOSIS — Z79899 Other long term (current) drug therapy: Secondary | ICD-10-CM | POA: Diagnosis not present

## 2022-08-09 DIAGNOSIS — S7002XA Contusion of left hip, initial encounter: Secondary | ICD-10-CM

## 2022-08-09 DIAGNOSIS — Z7982 Long term (current) use of aspirin: Secondary | ICD-10-CM | POA: Insufficient documentation

## 2022-08-09 DIAGNOSIS — S79912A Unspecified injury of left hip, initial encounter: Secondary | ICD-10-CM | POA: Diagnosis present

## 2022-08-09 DIAGNOSIS — M25552 Pain in left hip: Secondary | ICD-10-CM | POA: Diagnosis not present

## 2022-08-09 DIAGNOSIS — Z91010 Allergy to peanuts: Secondary | ICD-10-CM | POA: Diagnosis not present

## 2022-08-09 DIAGNOSIS — W010XXA Fall on same level from slipping, tripping and stumbling without subsequent striking against object, initial encounter: Secondary | ICD-10-CM | POA: Insufficient documentation

## 2022-08-09 NOTE — ED Triage Notes (Signed)
Pt to ED c/o left hip pain since yesterday. Reports fell yesterday . Pt able to walk, but painful.

## 2022-08-09 NOTE — ED Provider Notes (Signed)
[facility] EMERGENCY DEPARTMENT Provider Note   CSN: [id number] Arrival date & time: 08/09/22  1901     History  Chief Complaint  Patient presents with   Fall   Hip Pain    Clinton Ross is a 56 y.o. male.  The history is provided by the patient and medical records. No language interpreter was used.  Fall  Hip Pain     Patient is a 56 year old male presenting to ED with CC of left hip pain following a fall. Patient was cutting wood yesterday and tripped over a log. Patient denies head trauma, loss of consciousness, and mental status changes. Pain radiates to L buttock and is reported as continuous and 7/10 on pain scale. Patient describes pain as burning and aching. It is aggravated by ambulation, palpitation, and sitting upright. Patient took an oxycodone at home (prescribed for chronic back pain) without reduction in pain. Patient denies chest pain, back pain, pain in R hip/leg, loss of sensation, headache. *Special note: patient is currently in a clinical trial and is taking Asundexian or placebo. Not allowed to take ketorolac.  Home Medications Prior to Admission medications   Medication Sig Start Date End Date Taking? Authorizing Provider  amLODipine (NORVASC) 5 MG tablet Take 1 tablet (5 mg total) by mouth daily. Patient taking differently: Take 10 mg by mouth daily. 02/06/22   Lavina Hamman, MD  aspirin 81 MG chewable tablet Chew 1 tablet (81 mg total) by mouth daily. Patient taking differently: Chew 81 mg by mouth 3 (three) times a week. 02/23/18   Julianne Rice, MD  atorvastatin (LIPITOR) 40 MG tablet Take 1 tablet (40 mg total) by mouth daily. 02/07/22   Lavina Hamman, MD  cetirizine (ZYRTEC) 10 MG tablet Take 10 mg by mouth daily.    [provider]  clopidogrel (PLAVIX) 75 MG tablet Take 1 tablet (75 mg total) by mouth daily. 02/07/22   Lavina Hamman, MD  cyclobenzaprine (FLEXERIL) 10 MG tablet Take 1 tablet (10 mg total) by mouth at bedtime. 03/23/20    Wurst, [place], PA-C  fluticasone (FLONASE) 50 MCG/ACT nasal spray Place 1 spray into both nostrils 2 (two) times daily. 10/05/21   Volney American, PA-C  nitroGLYCERIN (NITROSTAT) 0.4 MG SL tablet Place 1 tablet (0.4 mg total) under the tongue every 5 (five) minutes as needed for chest pain. 02/23/18   Julianne Rice, MD  oxyCODONE (OXY IR/ROXICODONE) 5 MG immediate release tablet Take 5 mg by mouth daily as needed for severe pain.    [provider]  Study - OCEANIC-STROKE - asundexian 50 mg or placebo tablet (PI-Sethi) Take 1 tablet (50 mg total) by mouth daily. For Investigational Use Only. Take at the same time each day (preferably in the morning). Tablet should be swallowed whole with water; it CANNOT be crushed or broken. 02/07/22   Lavina Hamman, MD      Allergies    Peanut-containing drug products and Morphine and related    Review of Systems   Review of Systems  All other systems reviewed and are negative.   Physical Exam Updated Vital Signs BP 133/89   Pulse 74   Temp 98.2 F (36.8 C) (Oral)   Resp 18   Ht '5\' 10"'$  (1.778 m)   Wt 81.6 kg   SpO2 98%   BMI 25.83 kg/m  Physical Exam Vitals and nursing note reviewed.  Constitutional:      General: He is not in acute distress.  Appearance: He is well-developed.  HENT:     Head: Atraumatic.  Eyes:     Conjunctiva/sclera: Conjunctivae normal.  Abdominal:     Palpations: Abdomen is soft.     Tenderness: There is no abdominal tenderness.  Musculoskeletal:        General: Tenderness (Left hip: Tenderness noted to lateral hip with large ecchymosis noted.  No loss of radiation or deformity no shoulder of the leg or external rotation of the leg.) present.     Cervical back: Neck supple.     Comments: No midline spine tenderness, no knee tenderness no ankle tenderness.  Skin:    Capillary Refill: Capillary refill takes less than 2 seconds.     Findings: No rash.  Neurological:     Mental Status: He is  alert. Mental status is at baseline.     ED Results / Procedures / Treatments   Labs (all labs ordered are listed, but only abnormal results are displayed) Labs Reviewed - No data to display  EKG None  Radiology DG Hip Unilat With Pelvis 2-3 Views Left  Result Date: 08/09/2022 CLINICAL DATA:  Fall yesterday.  Left hip pain. EXAM: DG HIP (WITH OR WITHOUT PELVIS) 2-3V LEFT COMPARISON:  CT pelvis without contrast 04/15/2022 FINDINGS: Mild degenerative changes are again noted in both hips. Left hip is located. No acute bone or soft tissue abnormality is present. IMPRESSION: 1. Mild degenerative changes in both hips. 2. No acute abnormality. Electronically Signed   By: San Morelle M.D.   On: 08/09/2022 19:41    Procedures Procedures    Medications Ordered in ED Medications - No data to display  ED Course/ Medical Decision Making/ A&P                           Medical Decision Making Amount and/or Complexity of Data Reviewed Radiology: ordered.   BP 133/89   Pulse 74   Temp 98.2 F (36.8 C) (Oral)   Resp 18   Ht '5\' 10"'$  (1.778 m)   Wt 81.6 kg   SpO2 98%   BMI 25.83 kg/m   9:59 PM This is a 56 year old male significant history prior stroke on Plavix as well as an experimental trial medication who is presenting today with complaints of left hip injury.  Patient report yesterday he tripped over a log, fell landed directly onto the ground and striking his left hip against the ground.  He denies hitting his head or loss of consciousness.  He is able to get up and is able to ambulate but endorses moderate amount of pain about the left hip.  Pain is sharp achy throbbing nonradiating without any numbness or weakness.  No back pain no abdominal pain no headache.  He tries taking his home medication without adequate relief.  On exam, patient has tenderness about his left hip most notable to his lateral aspect of the hip.  There is a large ecchymosis appreciated without any  crepitus.  He is able to range his hip.  No tenderness to his midline spine knee or ankle.  He is neurovascular intact.  He is stable normal vital sign.  X-ray of the left hip was obtained independently viewed interpreted by me and I agree with radiologist interpretation.  Fortunately x-ray did not show any acute fracture or dislocation.  I suspect hematomas likely secondary to being on antiplatelet medication.  Since patient able to ambulate have less suspicion for occult fracture of  the hip.  At this time recommend rice therapy and outpatient follow-up.  I offered crutches but patient declined.  No other concerning injury.  He is stable to be discharged home, return precaution given.          Final Clinical Impression(s) / ED Diagnoses Final diagnoses:  Contusion of left hip, initial encounter    Rx / DC Orders ED Discharge Orders     None         Domenic Moras, PA-C 08/09/22 2214    Isla Pence, MD 08/10/22 [phone]

## 2022-08-09 NOTE — ED Notes (Signed)
Pt to Xray.

## 2022-08-09 NOTE — Discharge Instructions (Signed)
You have been evaluated for your fall.  Fortunately x-ray of the left hip did not show any broken bone.  You do have a large bruise to the left hip which will cause pain and discomfort.  Please continue to ice the affected area, rest when possible, and follow-up with your doctor for further care.  You may continue to take your opiate pain medication at home.  Return if you have any concern.

## 2022-09-23 HISTORY — PX: ELBOW SURGERY: SHX618

## 2022-10-21 ENCOUNTER — Ambulatory Visit
Admission: EM | Admit: 2022-10-21 | Discharge: 2022-10-21 | Disposition: A | Discharge status: Home / Self Care | Payer: No Typology Code available for payment source

## 2022-10-21 DIAGNOSIS — S50812A Abrasion of left forearm, initial encounter: Secondary | ICD-10-CM | POA: Diagnosis not present

## 2022-10-21 NOTE — ED Triage Notes (Signed)
Patient got scratched by dog yesterday morning. Pt states he is on blood thinners and lacerations keeps bleeding. I checked laceration and it was not currently bleeding but did wrap it up in case it started again.

## 2022-10-21 NOTE — Discharge Instructions (Addendum)
Keep area washed with soap and water and continue applying triple antibiotic ointment twice a day Watch for signs of infection, like redness or worse pain.

## 2022-10-21 NOTE — ED Provider Notes (Signed)
[facility]    CSN: [id number] Arrival date & time: 10/21/22  1706      History   Chief Complaint Chief Complaint  Patient presents with   Laceration    HPI Clinton Ross is a 57 y.o. male who presents due to having a scratch by his dog yesterday am and has continued  bleeding. Last TD 4 years ago. Had to change his dressing today x 4. Right before he came he applied a tight dressing, and has stopped since  here. Is on a blood thinner and bleeds easily.     Past Medical History:  Diagnosis Date   Acute pancreatitis 10/2013   Arthritis    Bulging disc    Cancer ([place])    Colon   Chronic knee pain    Colostomy in place [facility])    Gallstones    Hypertension    PTSD (post-traumatic stress disorder)    Sciatica     Patient Active Problem List   Diagnosis Date Noted   Internal carotid artery stenosis, right 02/08/2022   Hyperlipidemia 02/08/2022   Marijuana abuse 02/08/2022   Acute ischemic stroke ([place]) 02/05/2022   Pancreatitis 06/08/2018   Chest pain 07/21/2016   Cholelithiasis 11/28/2013   Tobacco abuse 11/28/2013   Abdominal pain 11/27/2013   Acute pancreatitis 11/20/2013   Constipation 11/20/2013   Essential hypertension 11/20/2013   PTSD (post-traumatic stress disorder) 11/20/2013   Sciatica 05/24/2009   DERANGEMENT MENISCUS 10/24/2008   JOINT EFFUSION, KNEE 10/24/2008   KNEE PAIN 10/24/2008    Past Surgical History:  Procedure Laterality Date   CHOLECYSTECTOMY     COLON SURGERY     rectal cancer Right        Home Medications    Prior to Admission medications   Medication Sig Start Date End Date Taking? Authorizing Provider  amLODipine (NORVASC) 5 MG tablet Take 1 tablet (5 mg total) by mouth daily. Patient taking differently: Take 10 mg by mouth daily. 02/06/22  Yes Lavina Hamman, MD  atorvastatin (LIPITOR) 40 MG tablet Take 1 tablet (40 mg total) by mouth daily. 02/07/22  Yes Lavina Hamman, MD  cetirizine (ZYRTEC) 10 MG tablet Take  10 mg by mouth daily.   Yes [provider]  clopidogrel (PLAVIX) 75 MG tablet Take 1 tablet (75 mg total) by mouth daily. 02/07/22  Yes Lavina Hamman, MD  cyclobenzaprine (FLEXERIL) 10 MG tablet Take 1 tablet (10 mg total) by mouth at bedtime. 03/23/20  Yes Wurst, [place], PA-C  oxyCODONE (OXY IR/ROXICODONE) 5 MG immediate release tablet Take 5 mg by mouth daily as needed for severe pain.   Yes [provider]  Study - OCEANIC-STROKE - asundexian 50 mg or placebo tablet (PI-Sethi) Take 1 tablet (50 mg total) by mouth daily. For Investigational Use Only. Take at the same time each day (preferably in the morning). Tablet should be swallowed whole with water; it CANNOT be crushed or broken. 02/07/22  Yes Lavina Hamman, MD  aspirin 81 MG chewable tablet Chew 1 tablet (81 mg total) by mouth daily. Patient taking differently: Chew 81 mg by mouth 3 (three) times a week. 02/23/18   Julianne Rice, MD  fluticasone (FLONASE) 50 MCG/ACT nasal spray Place 1 spray into both nostrils 2 (two) times daily. 10/05/21   Volney American, PA-C  nitroGLYCERIN (NITROSTAT) 0.4 MG SL tablet Place 1 tablet (0.4 mg total) under the tongue every 5 (five) minutes as needed for chest pain. 02/23/18  Julianne Rice, MD    Family History Family History  Problem Relation Age of Onset   Hypertension Mother    Stroke Father    Diabetes Other     Social History Social History   Tobacco Use   Smoking status: Every Day    Packs/day: 0.50    Years: 30.00    Total pack years: 15.00    Types: Cigarettes   Smokeless tobacco: Never  Vaping Use   Vaping Use: Never used  Substance Use Topics   Alcohol use: No   Drug use: No     Allergies   Peanut-containing drug products and Morphine and related   Review of Systems Review of Systems  Skin:  Positive for wound.   And as noted in HPI  Physical Exam Triage Vital Signs ED Triage Vitals  Enc Vitals Group     BP 10/21/22 1720 (!) 167/115      Pulse Rate 10/21/22 1720 73     Resp 10/21/22 1720 18     Temp 10/21/22 1720 98.3 F (36.8 C)     Temp Source 10/21/22 1720 Oral     SpO2 10/21/22 1720 97 %     Weight --      Height --      Head Circumference --      Peak Flow --      Pain Score 10/21/22 1734 0     Pain Loc --      Pain Edu? --      Excl. in [place]? --    No data found.  Updated Vital Signs BP (!) 167/115 (BP Location: Right Arm)   Pulse 73   Temp 98.3 F (36.8 C) (Oral)   Resp 18   SpO2 97%   Visual Acuity Right Eye Distance:   Left Eye Distance:   Bilateral Distance:    Right Eye Near:   Left Eye Near:    Bilateral Near:     Physical Exam Constitutional:      General: He is not in acute distress.    Appearance: He is normal weight. He is not toxic-appearing.  HENT:     Right Ear: External ear normal.     Left Ear: External ear normal.  Eyes:     General: No scleral icterus.    Conjunctiva/sclera: Conjunctivae normal.  Pulmonary:     Effort: Pulmonary effort is normal.  Musculoskeletal:        General: Normal range of motion.     Cervical back: Neck supple.  Skin:    General: Skin is warm and dry.     Comments: R FOREARM- with superficial abrasion of about 3 mm x 4 cm on dorsal area which is clean and not bleeding. Not red or hot.   Neurological:     Mental Status: He is alert and oriented to person, place, and time.     Gait: Gait normal.  Psychiatric:        Mood and Affect: Mood normal.        Behavior: Behavior normal.        Thought Content: Thought content normal.        Judgment: Judgment normal.      UC Treatments / Results  Labs (all labs ordered are listed, but only abnormal results are displayed) Labs Reviewed - No data to display  EKG   Radiology No results found.  Procedures Procedures (including critical care time)  Medications Ordered in UC Medications - No data  to display  Initial Impression / Assessment and Plan / UC Course  I have reviewed the  triage vital signs and the nursing notes.  R forearm abrasion from his dog  Area was cleansed with saline and soap. Antibiotic ointment applied with a dressing. Pt educated how to care for the wound.    Final Clinical Impressions(s) / UC Diagnoses   Final diagnoses:  Abrasion of left forearm, initial encounter     Discharge Instructions      Keep area washed with soap and water and continue applying triple antibiotic ointment twice a day Watch for signs of infection, like redness or worse pain.      ED Prescriptions   None    PDMP not reviewed this encounter.   Shelby Mattocks, PA-C 10/21/22 2020

## 2022-12-20 ENCOUNTER — Emergency Department (HOSPITAL_COMMUNITY)
Admission: EM | Admit: 2022-12-20 | Discharge: 2022-12-20 | Discharge status: Against Medical Advice | Payer: No Typology Code available for payment source

## 2022-12-20 ENCOUNTER — Ambulatory Visit
Admission: EM | Admit: 2022-12-20 | Discharge: 2022-12-20 | Disposition: A | Discharge status: Home / Self Care | Payer: No Typology Code available for payment source

## 2022-12-20 NOTE — ED Notes (Signed)
Called pt for triage, pt reports had left elbow surgery x2 days ago. Pt reports left arm intermittent tingling/numbness since this afternoon and states thought cast might be too tight but spouse does not feel comfortable re-wrapping LUE. Discussed with pt options at [facility] and that we were unable to see any records from procedure and were limited as to care that could be provided today. Pt aware that could be evaluated but would likely need to follow-up with surgeon or [facility]. Pt reports would go to facility that performed procedure. NAD noted. Provider aware.

## 2022-12-21 ENCOUNTER — Emergency Department (HOSPITAL_COMMUNITY)
Admission: EM | Admit: 2022-12-21 | Discharge: 2022-12-21 | Discharge status: Against Medical Advice | Payer: No Typology Code available for payment source

## 2023-02-04 ENCOUNTER — Other Ambulatory Visit (HOSPITAL_COMMUNITY): Payer: Self-pay

## 2023-02-04 MED ORDER — STUDY - OCEANIC-STROKE - ASUNDEXIAN 50 MG OR PLACEBO TABLET (PI-SETHI): 1.0000 | ORAL_TABLET | Freq: Every day | ORAL | 0 refills | Status: DC

## 2023-02-09 ENCOUNTER — Encounter: Payer: Self-pay | Admitting: Emergency Medicine

## 2023-02-09 ENCOUNTER — Ambulatory Visit
Admission: EM | Admit: 2023-02-09 | Discharge: 2023-02-09 | Disposition: A | Discharge status: Home / Self Care | Payer: No Typology Code available for payment source

## 2023-02-09 ENCOUNTER — Other Ambulatory Visit: Payer: Self-pay

## 2023-02-09 DIAGNOSIS — M778 Other enthesopathies, not elsewhere classified: Secondary | ICD-10-CM

## 2023-02-09 MED ORDER — PREDNISONE 10 MG PO TABS: 20.0000 mg | ORAL_TABLET | Freq: Every day | ORAL | 0 refills | Status: DC

## 2023-02-09 MED ORDER — DOXYCYCLINE HYCLATE 100 MG PO CAPS: 100.0000 mg | ORAL_CAPSULE | Freq: Two times a day (BID) | ORAL | 0 refills | Status: AC

## 2023-02-09 NOTE — Discharge Instructions (Addendum)
Get plenty of rest and push fluids Prednisone was prescribed for elbow tendinitis/take as directed Doxycycline was prescribed for possible skin infection Use OTC medications like ibuprofen or tylenol as needed fever or pain Call or go to the ED if you have any new or worsening symptoms

## 2023-02-09 NOTE — ED Triage Notes (Signed)
Pt reports left elbow pain and swelling x3 days. Denies any known injury but reports had surgery on left elbow on 4/27. Reports has had intermittent infections in site since surgery. Denies any fever and nausea but reports "it feels warm to touch."

## 2023-02-09 NOTE — ED Provider Notes (Signed)
[facility]   [id number] 02/09/23 Arrival Time: 0847  Chief Complaint  Patient presents with   Joint Swelling     SUBJECTIVE: History from: patient.  Clinton Ross is a 57 y.o. male presenting to urgent care for complaint of left elbow pain and swelling for the past 3 days.  Denies any precipitating event or injury.  Reports he had surgery on left elbow on 01/18/23 and has had intermittent infection on surgery site since.  Has tried OTC medication without relief.  Symptoms are made worse with ROM.  Denies similar symptoms in the past.   Denies fever, chills, fatigue, nausea, vomiting, diarrhea.  ROS: As per HPI.  All other pertinent ROS negative.      Past Medical History:  Diagnosis Date   Acute pancreatitis 10/2013   Arthritis    Bulging disc    Cancer ([facility])    Colon   Chronic knee pain    Colostomy in place [facility])    Gallstones    Hypertension    PTSD (post-traumatic stress disorder)    Sciatica    Past Surgical History:  Procedure Laterality Date   CHOLECYSTECTOMY     COLON SURGERY     ELBOW SURGERY Left 2024   rectal cancer Right    Allergies  Allergen Reactions   Peanut-Containing Drug Products Itching    Walnuts   Morphine And Codeine     Altered mental status-burning sensation all over   No current facility-administered medications on file prior to encounter.   Current Outpatient Medications on File Prior to Encounter  Medication Sig Dispense Refill   sertraline (ZOLOFT) 50 MG tablet Take 50 mg by mouth daily. Takes 1/2 a pill a day.     amLODipine (NORVASC) 5 MG tablet Take 1 tablet (5 mg total) by mouth daily. (Patient taking differently: Take 10 mg by mouth daily.) 30 tablet 0   aspirin 81 MG chewable tablet Chew 1 tablet (81 mg total) by mouth daily. (Patient taking differently: Chew 81 mg by mouth 3 (three) times a week.) 30 tablet 0   atorvastatin (LIPITOR) 40 MG tablet Take 1 tablet (40 mg total) by mouth daily. 30 tablet 0   cetirizine  (ZYRTEC) 10 MG tablet Take 10 mg by mouth daily.     clopidogrel (PLAVIX) 75 MG tablet Take 1 tablet (75 mg total) by mouth daily. 30 tablet 0   cyclobenzaprine (FLEXERIL) 10 MG tablet Take 1 tablet (10 mg total) by mouth at bedtime. 15 tablet 0   fluticasone (FLONASE) 50 MCG/ACT nasal spray Place 1 spray into both nostrils 2 (two) times daily. 16 g 0   nitroGLYCERIN (NITROSTAT) 0.4 MG SL tablet Place 1 tablet (0.4 mg total) under the tongue every 5 (five) minutes as needed for chest pain. 30 tablet 0   oxyCODONE (OXY IR/ROXICODONE) 5 MG immediate release tablet Take 5 mg by mouth daily as needed for severe pain.     Study - OCEANIC-STROKE - asundexian 50 mg or placebo tablet (PI-Sethi) Take 1 tablet (50 mg total) by mouth daily. For Investigational Use Only. Take at the same time each day (preferably in the morning). Tablet should be swallowed whole with water; it CANNOT be crushed or broken. 196 tablet 0   Social History   Socioeconomic History   Marital status: Married    Spouse name: Not on file   Number of children: Not on file   Years of education: Not on file   Highest education level: Not  on file  Occupational History   Not on file  Tobacco Use   Smoking status: Every Day    Packs/day: 0.50    Years: 30.00    Additional pack years: 0.00    Total pack years: 15.00    Types: Cigarettes   Smokeless tobacco: Never  Vaping Use   Vaping Use: Never used  Substance and Sexual Activity   Alcohol use: No   Drug use: No   Sexual activity: Not on file  Other Topics Concern   Not on file  Social History Narrative   Not on file   Social Determinants of Health   Financial Resource Strain: Not on file  Food Insecurity: Not on file  Transportation Needs: Not on file  Physical Activity: Not on file  Stress: Not on file  Social Connections: Not on file  Intimate Partner Violence: Not on file   Family History  Problem Relation Age of Onset   Hypertension Mother    Stroke Father     Diabetes Other     OBJECTIVE:  Vitals:   02/09/23 0859  BP: 137/88  Pulse: 81  Resp: 20  Temp: 98.2 F (36.8 C)  TempSrc: Oral  SpO2: 97%     Physical Exam Vitals and nursing note reviewed.  Constitutional:      General: He is not in acute distress.    Appearance: Normal appearance. He is normal weight. He is not ill-appearing, toxic-appearing or diaphoretic.  Cardiovascular:     Rate and Rhythm: Normal rate and regular rhythm.     Pulses: Normal pulses.     Heart sounds: Normal heart sounds. No murmur heard.    No friction rub. No gallop.  Pulmonary:     Effort: Pulmonary effort is normal. No respiratory distress.     Breath sounds: Normal breath sounds. No stridor. No wheezing, rhonchi or rales.  Chest:     Chest wall: No tenderness.  Musculoskeletal:     Right elbow: Normal.     Left elbow: Swelling present. Tenderness present.     Comments: The left elbow is with obvious deformity when compared to the right elbow.  No obvious surface trauma.  Swelling present with warm soft tissue .  Neurological:     Mental Status: He is alert and oriented to person, place, and time.      LABS:  No results found for this or any previous visit (from the past 24 hour(s)).   ASSESSMENT & PLAN:  1. Elbow tendinitis     Meds ordered this encounter  Medications   predniSONE (DELTASONE) 10 MG tablet    Sig: Take 2 tablets (20 mg total) by mouth daily.    Dispense:  14 tablet    Refill:  0   doxycycline (VIBRAMYCIN) 100 MG capsule    Sig: Take 1 capsule (100 mg total) by mouth 2 (two) times daily for 7 days.    Dispense:  14 capsule    Refill:  0   Discharge Instructions  Get plenty of rest and push fluids Prednisone was prescribed for elbow tendinitis/take as directed Doxycycline was prescribed for possible skin infection Use OTC medications like ibuprofen or tylenol as needed fever or pain Call or go to the ED if you have any new or worsening symptoms   Reviewed  expectations re: course of current medical issues. Questions answered. Outlined signs and symptoms indicating need for more acute intervention. Patient verbalized understanding. After Visit Summary given.  Durward Parcel, FNP 02/09/23 [phone]

## 2023-03-20 ENCOUNTER — Other Ambulatory Visit (HOSPITAL_COMMUNITY): Payer: Self-pay | Admitting: Family Medicine

## 2023-03-20 DIAGNOSIS — M545 Low back pain, unspecified: Secondary | ICD-10-CM

## 2023-04-15 ENCOUNTER — Ambulatory Visit (HOSPITAL_COMMUNITY)
Admission: RE | Admit: 2023-04-15 | Discharge: 2023-04-15 | Disposition: A | Discharge status: Home / Self Care | Payer: No Typology Code available for payment source | Source: Ambulatory Visit | Attending: Family Medicine | Admitting: Family Medicine

## 2023-04-15 DIAGNOSIS — M545 Low back pain, unspecified: Secondary | ICD-10-CM | POA: Diagnosis present

## 2023-04-15 DIAGNOSIS — M48061 Spinal stenosis, lumbar region without neurogenic claudication: Secondary | ICD-10-CM | POA: Diagnosis not present

## 2023-04-15 DIAGNOSIS — M5126 Other intervertebral disc displacement, lumbar region: Secondary | ICD-10-CM | POA: Diagnosis not present

## 2023-04-15 DIAGNOSIS — M5136 Other intervertebral disc degeneration, lumbar region: Secondary | ICD-10-CM | POA: Diagnosis not present

## 2023-04-15 DIAGNOSIS — N281 Cyst of kidney, acquired: Secondary | ICD-10-CM | POA: Diagnosis not present

## 2023-08-07 ENCOUNTER — Other Ambulatory Visit (HOSPITAL_COMMUNITY): Payer: Self-pay | Admitting: Neurology

## 2023-08-07 MED ORDER — STUDY - OCEANIC-STROKE - ASUNDEXIAN 50 MG OR PLACEBO TABLET (PI-SETHI): 1.0000 | ORAL_TABLET | Freq: Every day | ORAL | 0 refills | Status: DC

## 2023-11-18 ENCOUNTER — Other Ambulatory Visit: Payer: Self-pay

## 2023-11-18 ENCOUNTER — Emergency Department (HOSPITAL_COMMUNITY)
Admission: EM | Admit: 2023-11-18 | Discharge: 2023-11-18 | Discharge status: Against Medical Advice | Payer: No Typology Code available for payment source | Attending: Emergency Medicine | Admitting: Emergency Medicine

## 2023-11-18 ENCOUNTER — Encounter (HOSPITAL_COMMUNITY): Payer: Self-pay | Admitting: *Deleted

## 2023-11-18 DIAGNOSIS — M25561 Pain in right knee: Secondary | ICD-10-CM | POA: Diagnosis present

## 2023-11-18 DIAGNOSIS — Z5321 Procedure and treatment not carried out due to patient leaving prior to being seen by health care provider: Secondary | ICD-10-CM | POA: Diagnosis not present

## 2023-11-18 NOTE — ED Triage Notes (Signed)
 Pt c/o worsening right knee pain after he "stepped the wrong way in a hole" on Sunday. Pt has used Ibuprofen with no relief in pain.

## 2023-12-09 ENCOUNTER — Other Ambulatory Visit (HOSPITAL_COMMUNITY): Payer: Self-pay | Admitting: Physician Assistant

## 2023-12-09 DIAGNOSIS — G8929 Other chronic pain: Secondary | ICD-10-CM

## 2023-12-16 ENCOUNTER — Ambulatory Visit (HOSPITAL_COMMUNITY)
Admission: RE | Admit: 2023-12-16 | Discharge: 2023-12-16 | Disposition: A | Discharge status: Home / Self Care | Source: Ambulatory Visit | Attending: Physician Assistant | Admitting: Physician Assistant

## 2023-12-16 DIAGNOSIS — G8929 Other chronic pain: Secondary | ICD-10-CM | POA: Diagnosis present

## 2023-12-16 DIAGNOSIS — Q686 Discoid meniscus: Secondary | ICD-10-CM | POA: Diagnosis not present

## 2023-12-16 DIAGNOSIS — R609 Edema, unspecified: Secondary | ICD-10-CM | POA: Diagnosis not present

## 2023-12-16 DIAGNOSIS — M25561 Pain in right knee: Secondary | ICD-10-CM | POA: Insufficient documentation

## 2023-12-16 DIAGNOSIS — M2241 Chondromalacia patellae, right knee: Secondary | ICD-10-CM | POA: Diagnosis not present

## 2023-12-16 DIAGNOSIS — S83231A Complex tear of medial meniscus, current injury, right knee, initial encounter: Secondary | ICD-10-CM | POA: Diagnosis not present

## 2024-01-14 DIAGNOSIS — H5213 Myopia, bilateral: Secondary | ICD-10-CM | POA: Insufficient documentation

## 2024-01-14 DIAGNOSIS — F1290 Cannabis use, unspecified, uncomplicated: Secondary | ICD-10-CM | POA: Insufficient documentation

## 2024-01-14 DIAGNOSIS — N529 Male erectile dysfunction, unspecified: Secondary | ICD-10-CM | POA: Insufficient documentation

## 2024-01-14 DIAGNOSIS — Z79891 Long term (current) use of opiate analgesic: Secondary | ICD-10-CM | POA: Insufficient documentation

## 2024-01-14 DIAGNOSIS — C20 Malignant neoplasm of rectum: Secondary | ICD-10-CM | POA: Insufficient documentation

## 2024-01-14 DIAGNOSIS — M7702 Medial epicondylitis, left elbow: Secondary | ICD-10-CM | POA: Insufficient documentation

## 2024-01-14 DIAGNOSIS — G609 Hereditary and idiopathic neuropathy, unspecified: Secondary | ICD-10-CM | POA: Insufficient documentation

## 2024-01-14 DIAGNOSIS — M545 Low back pain, unspecified: Secondary | ICD-10-CM | POA: Insufficient documentation

## 2024-01-14 DIAGNOSIS — Z8673 Personal history of transient ischemic attack (TIA), and cerebral infarction without residual deficits: Secondary | ICD-10-CM | POA: Insufficient documentation

## 2024-01-16 ENCOUNTER — Ambulatory Visit (INDEPENDENT_AMBULATORY_CARE_PROVIDER_SITE_OTHER): Admitting: Orthopedic Surgery

## 2024-01-16 VITALS — BP 138/82 | HR 83 | Ht 69.5 in | Wt 180.4 lb

## 2024-01-16 DIAGNOSIS — M1711 Unilateral primary osteoarthritis, right knee: Secondary | ICD-10-CM | POA: Diagnosis not present

## 2024-01-16 DIAGNOSIS — M23321 Other meniscus derangements, posterior horn of medial meniscus, right knee: Secondary | ICD-10-CM

## 2024-01-16 DIAGNOSIS — Q686 Discoid meniscus: Secondary | ICD-10-CM | POA: Diagnosis not present

## 2024-01-16 DIAGNOSIS — Z8673 Personal history of transient ischemic attack (TIA), and cerebral infarction without residual deficits: Secondary | ICD-10-CM

## 2024-01-16 DIAGNOSIS — F1190 Opioid use, unspecified, uncomplicated: Secondary | ICD-10-CM

## 2024-01-16 NOTE — Addendum Note (Signed)
 Addended byArla Lab on: 01/16/2024 11:03 AM   Modules accepted: Orders

## 2024-01-16 NOTE — Progress Notes (Signed)
 Patient ID: Clinton Ross, male   DOB: July 03, 1966, 58 y.o.   MRN: [id number]  Chief Complaint  Patient presents with   Knee Pain    R after yrs of pain. Pt states he has multiple steroid injections that stopped lasting as long Had gel injection in Jan '25 and pain has been worse.     Current Outpatient Medications  Medication Instructions   acetaminophen  (TYLENOL ) 500 MG tablet Take by mouth.   amLODipine  (NORVASC ) 5 mg, Oral, Daily   aspirin  81 mg, Oral, Daily   atorvastatin  (LIPITOR) 40 mg, Oral, Daily   busPIRone (BUSPAR) 5 MG tablet Take by mouth.   cetirizine (ZYRTEC) 10 mg, Oral, Daily   clopidogrel  (PLAVIX ) 75 mg, Oral, Daily   cyclobenzaprine  (FLEXERIL ) 10 mg, Oral, Daily at bedtime   fluticasone  (FLONASE ) 50 MCG/ACT nasal spray 1 spray, Each Nare, 2 times daily   HYDROcodone -acetaminophen  (NORCO/VICODIN) 5-325 MG tablet Take by mouth.   ibuprofen  (ADVIL ) 600 MG tablet Take by mouth.   lisinopril  (ZESTRIL ) 20 MG tablet Oral   nitroGLYCERIN  (NITROSTAT ) 0.4 mg, Sublingual, Every 5 min PRN   OCEANIC-STROKE asundexian or placebo 50 mg, Oral, Daily, For Investigational Use Only. Take at the same time each day (preferably in the morning). Tablet should be swallowed whole with water; it CANNOT be crushed or broken.   oxyCODONE  (OXY IR/ROXICODONE ) 5 mg, Oral, Daily PRN   predniSONE  (DELTASONE ) 20 mg, Oral, Daily   rOPINIRole (REQUIP) 1 MG tablet Take by mouth.   sertraline  (ZOLOFT ) 50 mg, Oral, Daily, Takes 1/2 a pill a day.   tamsulosin (FLOMAX) 0.4 MG CAPS capsule Take by mouth.    Allergies  Allergen Reactions   Peanut-Containing Drug Products Itching    Walnuts   Morphine  And Codeine     Altered mental status-burning sensation all over     Knee Pain  Pertinent negatives include no tingling.   58 year old male with previous history of osteoarthritis of the right knee, he is on an investigational drug for what sounds like he had a TIA.  He came to the [facility] office  due to the [place] being backed up he used to community care service for evaluation of his right knee has had an MRI it shows a torn medial lateral meniscus and he was previously being treated for osteoarthritis he had 7-10 cortisone injections and 1 gel injection  He was in his usual state of health until a month after his last gel injection which was in February he stepped in a hole twisted his knee it hyperextended and has had trouble since  He complains of intermittent pain occasional swelling and a toothache like sensation with pain on the medial and lateral aspects of the knee and then he says if he steps the wrong way the pain increases again.  Review of Systems  Constitutional:  Negative for chills, fever and weight loss.  Respiratory:  Negative for shortness of breath.   Cardiovascular:  Negative for chest pain.  Musculoskeletal:  Positive for back pain and joint pain.  Neurological:  Negative for tingling.       Past Medical History:  Diagnosis Date   Acute pancreatitis 10/2013   Arthritis    Bulging disc    Cancer [facility])    Colon   Chronic knee pain    Colostomy in place [facility])    Gallstones    Hypertension    PTSD (post-traumatic stress disorder)    Sciatica    Stroke (HCC) 2023   "  light" per pt    Past Surgical History:  Procedure Laterality Date   CHOLECYSTECTOMY     COLON SURGERY     ELBOW SURGERY Left 2024   rectal cancer Right     PHYSICAL EXAM  BP 138/82   Pulse 83   Ht 5' 9.5" (1.765 m)   Wt 180 lb 6.4 oz (81.8 kg)   BMI 26.26 kg/m  GENERAL appearance reveals no gross abnormalities, normal development grooming and hygiene   MENTAL STATUS we note that the patient is awake alert and oriented to person place and time MOOD/AFFECT ARE NORMAL   GAIT reveals positive for limp in the effected limb  Ortho Exam  Right knee no effusion in the knee  Normal flexion but painful Extension 10-0  Medial joint line tenderness  Positive McMurray's for medial  joint pain  Ligaments stable  Skin intact   VASC 2+ dorsalis pedis pulse normal capillary refill excellent warmth to the extremity  NEURO normal sensation and no pathologic reflexes  LYMPH deferred noncontributory  MEDICAL DECISION MAKING  A.  Encounter Diagnoses  Name Primary?   Primary osteoarthritis of right knee Yes   Derangement of posterior horn of medial meniscus of right knee    Discoid lateral meniscus of right knee    Opioid use    Remote history of TIA     B. DATA ANALYSED:   IMAGING: Independent interpretation of images:   Imaging #1 right knee mild OA chondrocalcinosis  Image #2 torn medial meniscus discoid lateral close with tear osteoarthritis right knee  Orders: None at this time  Outside records reviewed: Yes  C. MANAGEMENT the patient is interested in surgical treatment at this time.  He was sent here because the [place] wait times were long.  I agree with the notes from the [place] that he could be a candidate for replacement or meniscectomy however, because of his other medical problems investigational drug use for stroke PTSD chronic opioid use I feel more comfortable with arthroscopic surgery first and then if he does not improve referral for knee replacement back to the [place]  Return for preop after  #1 we find out what the investigational medication is and what we need to do prior to surgery to proceed  Addendum the patient wants a knee brace.  The knee brace he received from the [place] he said is too tight.  No orders of the defined types were placed in this encounter.

## 2024-01-16 NOTE — Progress Notes (Signed)
  Intake history:  BP 138/82   Pulse 83   Ht 5' 9.5" (1.765 m)   Wt 180 lb 6.4 oz (81.8 kg)   BMI 26.26 kg/m  Body mass index is 26.26 kg/m.    WHAT ARE WE SEEING YOU FOR TODAY?   right knee(s)  How long has this bothered you? (DOI?DOS?WS?)   Years worse after gel injection in Jan '25 and an awkward twist.  Anticoag.  Yes  Diabetes No  Heart disease No  Hypertension Yes  SMOKING HX Yes  Kidney disease No  Any ALLERGIES ______________________________________________   Treatment:  Have you taken:  Tylenol  Yes  Advil  Yes  Had PT No  Had injection Yes  Other  _________________________

## 2024-01-16 NOTE — Patient Instructions (Addendum)
 We will check with Neurology about the medication to see when you stop which meds   You should check with primary care about clearance also bring them the letter we gave you today

## 2024-01-16 NOTE — Addendum Note (Signed)
 Addended byArla Lab on: 01/16/2024 11:00 AM   Modules accepted: Orders

## 2024-02-10 ENCOUNTER — Other Ambulatory Visit (HOSPITAL_COMMUNITY): Payer: Self-pay | Admitting: Pharmacist

## 2024-02-10 MED ORDER — STUDY - OCEANIC-STROKE - ASUNDEXIAN 50 MG OR PLACEBO TABLET (PI-SETHI): 1.0000 | ORAL_TABLET | Freq: Every day | ORAL | 0 refills | Status: AC

## 2024-02-13 ENCOUNTER — Ambulatory Visit (INDEPENDENT_AMBULATORY_CARE_PROVIDER_SITE_OTHER): Admitting: Orthopedic Surgery

## 2024-02-13 ENCOUNTER — Encounter: Payer: Self-pay | Admitting: Orthopedic Surgery

## 2024-02-13 VITALS — BP 117/72 | HR 71 | Ht 70.0 in | Wt 184.0 lb

## 2024-02-13 DIAGNOSIS — G8918 Other acute postprocedural pain: Secondary | ICD-10-CM

## 2024-02-13 DIAGNOSIS — M23321 Other meniscus derangements, posterior horn of medial meniscus, right knee: Secondary | ICD-10-CM | POA: Diagnosis not present

## 2024-02-13 DIAGNOSIS — Z01818 Encounter for other preprocedural examination: Secondary | ICD-10-CM | POA: Diagnosis not present

## 2024-02-13 DIAGNOSIS — M1711 Unilateral primary osteoarthritis, right knee: Secondary | ICD-10-CM | POA: Diagnosis not present

## 2024-02-13 MED ORDER — OXYCODONE-ACETAMINOPHEN 5-325 MG PO TABS: 1.0000 | ORAL_TABLET | Freq: Four times a day (QID) | ORAL | 0 refills | Status: AC | PRN

## 2024-02-13 NOTE — Progress Notes (Signed)
   Chief Complaint  Patient presents with   Pre-op Exam    Right knee- pain comes and goes.    Clinton Ross is on Plavix  he was on some experimental medication but it is not going to interfere with his surgery  He went to the beach and came back he said he walked a lot at the beach.  He says that the area of his quadriceps hurts a lot.  He still having pain medial and lateral aspect of the knee  We discussed his difficult situation today to make sure were all on the same page  He is 58 years old he has arthritis on his MRI he has mild arthritis on his x-ray he has a torn meniscus as well  Just looking at his x-ray that showed mild OA with chondrocalcinosis  His MRI showed torn medial meniscus discoid lateral meniscus with tear and osteoarthritis  His knee x-ray does not warrant knee replacement.  I think he can get some relief from arthroscopic surgery but will have to be prepared for ongoing pain and he is comfortable with that understanding that this will not likely end all of his symptoms  He wishes to proceed with arthroscopy of the right knee and partial medial meniscectomy.  The discoid lateral meniscus will be evaluated for tear as well  He will stop Plavix  2 to 3 days before surgery can resume 2 days after  Note under review of systems patient had a recent bout of bronchitis he was placed on antibiotics prednisone  and an inhaler and says he is much better after 3 days.  I think it is likely that he will be over his bronchitis episode by the time the surgery will be scheduled which is June 3 with a preop on the 29th and a postop on the ninth  He understands he has same-day surgery He needs a walker and was given a prescription Postop on the ninth in the afternoon 3 to 4 weeks of home therapy and then rehabilitation if necessary Prescription Percocet 5 mg every 6 #20 written for June 2  Meds ordered this encounter  Medications   oxyCODONE -acetaminophen  (PERCOCET) 5-325 MG tablet     Sig: Take 1 tablet by mouth every 6 (six) hours as needed for up to 5 days for severe pain (pain score 7-10).    Dispense:  20 tablet    Refill:  0

## 2024-02-13 NOTE — Patient Instructions (Signed)
 Your surgery will be at [facility] by Dr Romeo Apple  The hospital will contact you with a preoperative appointment to discuss Anesthesia.  Please arrive on time or 15 minutes early for the preoperative appointment, they have a very tight schedule if you are late or do not come in your surgery will be cancelled.  The phone number is [phone]. Please bring your medications with you for the appointment. They will tell you the arrival time and medication instructions when you have your preoperative evaluation. Do not wear nail polish the day of your surgery and if you take Phentermine you need to stop this medication ONE WEEK prior to your surgery. If you take Docia Barrier, Jardiance, or Steglatro) - Hold 72 hours before the procedure.  If you take Ozempic,  Mounjaro, Bydureon or Trulicity do not take for 8 days before your surgery. If you take Victoza, Rybelsis, Saxenda or Adlyxi stop 24 hours before the procedure.  Please arrive at the hospital 2 hours before procedure if scheduled at 9:30 or later in the day or at the time the nurse tells you at your preoperative visit.   If you have my chart do not use the time given in my chart use the time given to you by the nurse during your preoperative visit.   Your surgery  time may change. Please be available for phone calls the day of your surgery and the day before. The Short Stay department may need to discuss changes about your surgery time. Not reaching the you could lead to procedure delays and possible cancellation.  You must have a ride home and someone to stay with you for 24 to 48 hours. The person taking you home will receive and sign for the your discharge instructions.  Please be prepared to give your support person's name and telephone number to [facility]. Dr Romeo Apple will need that name and phone number post procedure.

## 2024-02-17 ENCOUNTER — Telehealth: Payer: Self-pay

## 2024-02-17 NOTE — Telephone Encounter (Signed)
 Patient stopped by the office and left his prescription for a walker here.  He stated that he was told by [facility] that we have to request the walker through [facility] and have it delivered to his home. His address is as follows: [address] [place],   He stated he is scheduled for surgery next week and really need to get this asap.  Please call and advise  [phone] There is a sticky note on his prescription paper with community care phone number.  I have put his prescription paper in Amy's box 1

## 2024-02-17 NOTE — Patient Instructions (Signed)
 Clinton Ross  02/17/2024     @PREFPERIOPPHARMACY @   Your procedure is scheduled on  02/24/2024.   Report to [facility] at  0600  A.M.   Call this number if you have problems the morning of surgery:  [phone]  If you experience any cold or flu symptoms such as cough, fever, chills, shortness of breath, etc. between now and your scheduled surgery, please notify us  at the above number.   Remember:        Your last dose of plavix  should be on 02/21/2024.   Do not eat after midnight.   You may drink clear liquids until  0330 am on 02/24/2024.    Clear liquids allowed are:                    Water, Juice (No red color; non-citric and without pulp; diabetics please choose diet or no sugar options), Carbonated beverages (diabetics please choose diet or no sugar options), Clear Tea (No creamer, milk, or cream, including half & half and powdered creamer), Black Coffee Only (No creamer, milk or cream, including half & half and powdered creamer), and Clear Sports drink (No red color; diabetics please choose diet or no sugar options)    Take these medicines the morning of surgery with A SIP OF WATER         tessalon, flexeril (if needed), medrol , oxycodone  (if needed), sertraline .    Do not wear jewelry, make-up or nail polish, including gel polish,  artificial nails, or any other type of covering on natural nails (fingers and  toes).  Do not wear lotions, powders, or perfumes, or deodorant.  Do not shave 48 hours prior to surgery.  Men may shave face and neck.  Do not bring valuables to the hospital.  [facility] is not responsible for any belongings or valuables.  Contacts, dentures or bridgework may not be worn into surgery.  Leave your suitcase in the car.  After surgery it may be brought to your room.  For patients admitted to the hospital, discharge time will be determined by your treatment team.  Patients discharged the day of surgery will not be allowed to drive home  and must have someone with them for 24 hours.    Special instructions:  DO NOT smoke tobacco or vape for 24 hours before your procedure.  Please read over the following fact sheets that you were given. Coughing and Deep Breathing, Surgical Site Infection Prevention, Anesthesia Post-op Instructions, and Care and Recovery After Surgery        Arthroscopic Knee Ligament Repair, Care After After your knee surgery, it's common to have soreness, swelling, or pain. You may also have some fluid coming from the cuts that were made on your knee (incisions). Follow these instructions at home: Medicines Take over-the-counter and prescription medicines only as told by your health care provider. Ask your provider if the medicine prescribed to you: Requires you to avoid driving or using machinery. Can cause constipation. You may need to take these actions to prevent or treat constipation: Drink enough fluid to keep your pee (urine) pale yellow. Take over-the-counter or prescription medicines. Eat foods that are high in fiber, such as beans, whole grains, and fresh fruits and vegetables. Limit foods that are high in fat and processed sugars, such as fried or sweet foods. If you have a brace: Wear the brace as told by your provider. Remove it only as told  by your provider. Check the skin around the brace every day. Tell your provider about any concerns. Loosen the brace if your toes tingle, become numb, or turn cold and blue. Keep it clean and dry. Ask your provider when it's safe to drive if you have a brace on your knee. Bathing Do not take baths, swim, or use a hot tub until your provider approves. Keep your bandage dry until your provider says that it can be removed. If the brace is not waterproof: Do not let it get wet. Cover it with a watertight covering when you take a bath or shower. Incision care  Follow instructions from your provider about how to take care of your incisions. Make sure  you: Wash your hands with soap and water for at least 20 seconds before and after you change your dressing. If soap and water are not available, use hand sanitizer. Change your dressing as told by your provider. Leave stitches, skin glue, or tape strips in place. These skin closures may need to stay in place for 2 weeks or longer. If tape strip edges start to loosen and curl up, you may trim the loose edges. Do not remove tape strips completely unless your provider tells you to do that. Check your incision areas every day for signs of infection. Check for: Redness. More swelling or pain. Blood or more fluid. Warmth. Pus or a bad smell. Managing pain, stiffness, and swelling  If told, put ice on the affected area. If you have a removable brace, remove it as told by your provider. Put ice in a plastic bag. Place a towel between your skin and the bag. Leave the ice on for 20 minutes, 2-3 times a day. If your skin turns bright red, remove the ice right away to prevent skin damage. The risk of damage is higher if you can't feel pain, heat, or cold. Move your toes often to reduce stiffness and swelling. Raise the injured area above the level of your heart while you are sitting or lying down. Activity Do not use your knee to walk until your provider says that you can. Use crutches or other devices as told by your provider. You will work with a physical therapist to do exercises. This will make your knee move better and get stronger. Your provider will tell you: When you may do exercises that move parts of your body. These are called motion exercises. When you may start to ride a stationary bike and other gentle exercises. When you may start to do harder exercises, such as jogging. You may have to avoid lifting. Ask your provider how much you can safely lift. Return to your normal activities as told by your provider. Ask your provider what activities are safe for you. General instructions Do  not use any products that contain nicotine  or tobacco. These products include cigarettes, chewing tobacco, and vaping devices, such as e-cigarettes. If you need help quitting, ask your provider. Wear compression stockings as told by your provider. These stockings help to prevent blood clots and reduce swelling in your leg. Keep all follow-up visits. Your provider will check that your knee is healing well. Contact a health care provider if: You have signs of infection in the cuts that were made on your knee. Signs include swelling, redness, warmth, pus, or a bad smell. You have a fever or chills. You have pain that does not get better with medicine. The cuts in your knee open up. Get help right away if:  You have trouble breathing. You have chest pain. You have worse pain or swelling in your calf or at the back of your knee. Your knee, foot, ankle, or toes tingle or become numb. Your foot or toes look darker than normal or are cooler than normal. These symptoms may be an emergency. Get help right away. Call 911. Do not wait to see if the symptoms will go away. Do not drive yourself to the hospital. This information is not intended to replace advice given to you by your health care provider. Make sure you discuss any questions you have with your health care provider. Document Revised: 12/04/2022 Document Reviewed: 12/04/2022 Elsevier Patient Education  2024 Elsevier Inc.General Anesthesia, Adult, Care After The following information offers guidance on how to care for yourself after your procedure. Your health care provider may also give you more specific instructions. If you have problems or questions, contact your health care provider. What can I expect after the procedure? After the procedure, it is common for people to: Have pain or discomfort at the IV site. Have nausea or vomiting. Have a sore throat or hoarseness. Have trouble concentrating. Feel cold or chills. Feel weak, sleepy, or  tired (fatigue). Have soreness and body aches. These can affect parts of the body that were not involved in surgery. Follow these instructions at home: For the time period you were told by your health care provider:  Rest. Do not participate in activities where you could fall or become injured. Do not drive or use machinery. Do not drink alcohol. Do not take sleeping pills or medicines that cause drowsiness. Do not make important decisions or sign legal documents. Do not take care of children on your own. General instructions Drink enough fluid to keep your urine pale yellow. If you have sleep apnea, surgery and certain medicines can increase your risk for breathing problems. Follow instructions from your health care provider about wearing your sleep device: Anytime you are sleeping, including during daytime naps. While taking prescription pain medicines, sleeping medicines, or medicines that make you drowsy. Return to your normal activities as told by your health care provider. Ask your health care provider what activities are safe for you. Take over-the-counter and prescription medicines only as told by your health care provider. Do not use any products that contain nicotine  or tobacco. These products include cigarettes, chewing tobacco, and vaping devices, such as e-cigarettes. These can delay incision healing after surgery. If you need help quitting, ask your health care provider. Contact a health care provider if: You have nausea or vomiting that does not get better with medicine. You vomit every time you eat or drink. You have pain that does not get better with medicine. You cannot urinate or have bloody urine. You develop a skin rash. You have a fever. Get help right away if: You have trouble breathing. You have chest pain. You vomit blood. These symptoms may be an emergency. Get help right away. Call 911. Do not wait to see if the symptoms will go away. Do not drive yourself  to the hospital. Summary After the procedure, it is common to have a sore throat, hoarseness, nausea, vomiting, or to feel weak, sleepy, or fatigue. For the time period you were told by your health care provider, do not drive or use machinery. Get help right away if you have difficulty breathing, have chest pain, or vomit blood. These symptoms may be an emergency. This information is not intended to replace advice given to you  by your health care provider. Make sure you discuss any questions you have with your health care provider. Document Revised: 12/07/2021 Document Reviewed: 12/07/2021 Elsevier Patient Education  2024 Elsevier Inc.How to Use Chlorhexidine  at Home in the Shower Chlorhexidine  gluconate (CHG) is a germ-killing (antiseptic) wash that's used to clean the skin. It can get rid of the germs that normally live on the skin and can keep them away for about 24 hours. If you're having surgery, you may be told to shower with CHG at home the night before surgery. This can help lower your risk for infection. To use CHG wash in the shower, follow the steps below. Supplies needed: CHG body wash. Clean washcloth. Clean towel. How to use CHG in the shower Follow these steps unless you're told to use CHG in a different way: Start the shower. Use your normal soap and shampoo to wash your face and hair. Turn off the shower or move out of the shower stream. Pour CHG onto a clean washcloth. Do not use any type of brush or rough sponge. Start at your neck, washing your body down to your toes. Make sure you: Wash the part of your body where the surgery will be done for at least 1 minute. Do not scrub. Do not use CHG on your head or face unless your health care provider tells you to. If it gets into your ears or eyes, rinse them well with water. Do not wash your genitals with CHG. Wash your back and under your arms. Make sure to wash skin folds. Let the CHG sit on your skin for 1-2 minutes or as  long as told. Rinse your entire body in the shower, including all body creases and folds. Turn off the shower. Dry off with a clean towel. Do not put anything on your skin afterward, such as powder, lotion, or perfume. Put on clean clothes or pajamas. If it's the night before surgery, sleep in clean sheets. General tips Use CHG only as told, and follow the instructions on the label. Use the full amount of CHG as told. This is often one bottle. Do not smoke and stay away from flames after using CHG. Your skin may feel sticky after using CHG. This is normal. The sticky feeling will go away as the CHG dries. Do not use CHG: If you have a chlorhexidine  allergy or have reacted to chlorhexidine  in the past. On open wounds or areas of skin that have broken skin, cuts, or scrapes. On babies younger than 72 months of age. Contact a health care provider if: You have questions about using CHG. Your skin gets irritated or itchy. You have a rash after using CHG. You swallow any CHG. Call your local poison control center [phone] in the [place] Your eyes itch badly, or they become very red or swollen. Your hearing changes. You have trouble seeing. If you can't reach your provider, go to an urgent care or emergency room. Do not drive yourself. Get help right away if: You have swelling or tingling in your mouth or throat. You make high-pitched whistling sounds when you breathe, most often when you breathe out (wheeze). You have trouble breathing. These symptoms may be an emergency. Call 911 right away. Do not wait to see if the symptoms will go away. Do not drive yourself to the hospital. This information is not intended to replace advice given to you by your health care provider. Make sure you discuss any questions you have with your health care  provider. Document Revised: 03/25/2023 Document Reviewed: 03/21/2022 Elsevier Patient Education  2024 Elsevier Inc.How to Use an Incentive  Spirometer An incentive spirometer is a tool that measures how well you are filling your lungs with each breath. Learning to take long, deep breaths using this tool can help you keep your lungs clear and active. This may help to reverse or lessen your chance of developing breathing (pulmonary) problems, especially infection. You may be asked to use a spirometer: After a surgery. If you have a lung problem or a history of smoking. After a long period of time when you have been unable to move or be active. If the spirometer includes an indicator to show the highest number that you have reached, your health care provider or respiratory therapist will help you set a goal. Keep a log of your progress as told by your health care provider. What are the risks? Breathing too quickly may cause dizziness or cause you to pass out. Take your time so you do not get dizzy or light-headed. If you are in pain, you may need to take pain medicine before doing incentive spirometry. It is harder to take a deep breath if you are having pain. How to use your incentive spirometer  Sit up on the edge of your bed or on a chair. Hold the incentive spirometer so that it is in an upright position. Before you use the spirometer, breathe out normally. Place the mouthpiece in your mouth. Make sure your lips are closed tightly around it. Breathe in slowly and as deeply as you can through your mouth, causing the piston or the ball to rise toward the top of the chamber. Hold your breath for 3-5 seconds, or for as long as possible. If the spirometer includes a coach indicator, use this to guide you in breathing. Slow down your breathing if the indicator goes above the marked areas. Remove the mouthpiece from your mouth and breathe out normally. The piston or ball will return to the bottom of the chamber. Rest for a few seconds, then repeat the steps 10 or more times. Take your time and take a few normal breaths between deep breaths  so that you do not get dizzy or light-headed. Do this every 1-2 hours when you are awake. If the spirometer includes a goal marker to show the highest number you have reached (best effort), use this as a goal to work toward during each repetition. After each set of 10 deep breaths, cough a few times. This will help to make sure that your lungs are clear. If you have an incision on your chest or abdomen from surgery, place a pillow or a rolled-up towel firmly against the incision when you cough. This can help to reduce pain while taking deep breaths and coughing. General tips When you are able to get out of bed: Walk around often. Continue to take deep breaths and cough in order to clear your lungs. Keep using the incentive spirometer until your health care provider says it is okay to stop using it. If you have been in the hospital, you may be told to keep using the spirometer at home. Contact a health care provider if: You are having difficulty using the spirometer. You have trouble using the spirometer as often as instructed. Your pain medicine is not giving enough relief for you to use the spirometer as told. You have a fever. Get help right away if: You develop shortness of breath. You develop a cough with  bloody mucus from the lungs. You have fluid or blood coming from an incision site after you cough. Summary An incentive spirometer is a tool that can help you learn to take long, deep breaths to keep your lungs clear and active. You may be asked to use a spirometer after a surgery, if you have a lung problem or a history of smoking, or if you have been inactive for a long period of time. Use your incentive spirometer as instructed every 1-2 hours while you are awake. If you have an incision on your chest or abdomen, place a pillow or a rolled-up towel firmly against your incision when you cough. This will help to reduce pain. Get help right away if you have shortness of breath, you cough  up bloody mucus, or blood comes from your incision when you cough. This information is not intended to replace advice given to you by your health care provider. Make sure you discuss any questions you have with your health care provider. Document Revised: 07/18/2023 Document Reviewed: 07/18/2023 Elsevier Patient Education  2024 [employer].

## 2024-02-18 NOTE — Telephone Encounter (Signed)
 I called to check on it but had to hold more than 5 minutes Had to disconnect and room patients   Need a fax number I called patient asked him to get fax number for me  Left message for him to call me back

## 2024-02-18 NOTE — Telephone Encounter (Addendum)
 I called again [phone] ext [phone] held again  no answer no voice mail unable to get through   Left message for patient again.

## 2024-02-18 NOTE — Addendum Note (Signed)
 Addended byArla Lab on: 02/18/2024 10:14 AM   Modules accepted: Orders

## 2024-02-19 ENCOUNTER — Encounter (HOSPITAL_COMMUNITY): Payer: Self-pay

## 2024-02-19 ENCOUNTER — Encounter (HOSPITAL_COMMUNITY)
Admission: RE | Admit: 2024-02-19 | Discharge: 2024-02-19 | Disposition: A | Discharge status: Home / Self Care | Source: Ambulatory Visit | Attending: Orthopedic Surgery | Admitting: Orthopedic Surgery

## 2024-02-19 VITALS — BP 111/84 | HR 76 | Temp 98.0°F | Resp 18 | Ht 70.0 in | Wt 184.1 lb

## 2024-02-19 DIAGNOSIS — I10 Essential (primary) hypertension: Secondary | ICD-10-CM | POA: Insufficient documentation

## 2024-02-19 DIAGNOSIS — Q686 Discoid meniscus: Secondary | ICD-10-CM | POA: Diagnosis not present

## 2024-02-19 DIAGNOSIS — M23321 Other meniscus derangements, posterior horn of medial meniscus, right knee: Secondary | ICD-10-CM | POA: Diagnosis not present

## 2024-02-19 DIAGNOSIS — Z01818 Encounter for other preprocedural examination: Secondary | ICD-10-CM | POA: Insufficient documentation

## 2024-02-19 DIAGNOSIS — Z720 Tobacco use: Secondary | ICD-10-CM

## 2024-02-19 HISTORY — DX: Sleep apnea, unspecified: G47.30

## 2024-02-19 LAB — BASIC METABOLIC PANEL WITH GFR
Anion gap: 9 (ref 5–15)
BUN: 16 mg/dL (ref 6–20)
CO2: 25 mmol/L (ref 22–32)
Calcium: 9.1 mg/dL (ref 8.9–10.3)
Chloride: 104 mmol/L (ref 98–111)
Creatinine, Ser: 0.86 mg/dL (ref 0.61–1.24)
GFR, Estimated: >60 mL/min (ref >60)
Glucose, Bld: 81 mg/dL (ref 70–99)
Potassium: 3.9 mmol/L (ref 3.5–5.1)
Sodium: 138 mmol/L (ref 135–145)

## 2024-02-19 LAB — CBC WITH DIFFERENTIAL/PLATELET
Abs Immature Granulocytes: 0.07 10*3/uL (ref 0.00–0.07)
Basophils Absolute: 0.1 10*3/uL (ref 0.0–0.1)
Basophils Relative: 1 %
Eosinophils Absolute: 0.3 10*3/uL (ref 0.0–0.5)
Eosinophils Relative: 2 %
HCT: 51.4 % (ref 39.0–52.0)
Hemoglobin: 17.3 g/dL — ABNORMAL HIGH (ref 13.0–17.0)
Immature Granulocytes: 1 %
Lymphocytes Relative: 11 %
Lymphs Abs: 1.1 10*3/uL (ref 0.7–4.0)
MCH: 29.9 pg (ref 26.0–34.0)
MCHC: 33.7 g/dL (ref 30.0–36.0)
MCV: 88.9 fL (ref 80.0–100.0)
Monocytes Absolute: 0.8 10*3/uL (ref 0.1–1.0)
Monocytes Relative: 8 %
Neutro Abs: 8 10*3/uL — ABNORMAL HIGH (ref 1.7–7.7)
Neutrophils Relative %: 77 %
Platelets: 201 10*3/uL (ref 150–400)
RBC: 5.78 MIL/uL (ref 4.22–5.81)
RDW: 13.9 % (ref 11.5–15.5)
WBC: 10.3 10*3/uL (ref 4.0–10.5)
nRBC: 0 % (ref 0.0–0.2)

## 2024-02-19 NOTE — Telephone Encounter (Signed)
 Thanks I was struggling trying to get through the [place] wasn't going to happen any time soon they wont answer

## 2024-02-19 NOTE — Telephone Encounter (Signed)
 DR. Lonni Robert came in the office and he said to let you know he already has a walker.

## 2024-02-23 NOTE — H&P (Signed)
 PRE OP OUTPATIENT SURGERY HISTORY AND PHYSICAL  Chief Complaint  Patient presents with   Knee Pain      RIGHT KNEE FOR YEARS      Knee Pain  Pertinent negatives include no tingling.    58 year old male with previous history of osteoarthritis of the right knee, he is on an investigational drug for what sounds like he had a TIA.  He came to the [place] office due to the [place] being backed up he used to community care service for evaluation of his right knee has had an MRI it shows a torn medial lateral meniscus and he was previously being treated for osteoarthritis he had 7-10 cortisone injections and 1 gel injection   He was in his usual state of health until a month after his last gel injection which was in February he stepped in a hole twisted his knee it hyperextended and has had trouble since   He complains of intermittent pain occasional swelling and a toothache like sensation with pain on the medial and lateral aspects of the knee and then he says if he steps the wrong way the pain increases again.           Current Outpatient Medications  Medication Instructions   acetaminophen  (TYLENOL ) 500 MG tablet Take by mouth.   amLODipine  (NORVASC ) 5 mg, Oral, Daily   aspirin  81 mg, Oral, Daily   atorvastatin  (LIPITOR) 40 mg, Oral, Daily   busPIRone (BUSPAR) 5 MG tablet Take by mouth.   cetirizine (ZYRTEC) 10 mg, Oral, Daily   clopidogrel  (PLAVIX ) 75 mg, Oral, Daily   cyclobenzaprine  (FLEXERIL ) 10 mg, Oral, Daily at bedtime   fluticasone  (FLONASE ) 50 MCG/ACT nasal spray 1 spray, Each Nare, 2 times daily   HYDROcodone -acetaminophen  (NORCO/VICODIN) 5-325 MG tablet Take by mouth.   ibuprofen  (ADVIL ) 600 MG tablet Take by mouth.   lisinopril  (ZESTRIL ) 20 MG tablet Oral   nitroGLYCERIN  (NITROSTAT ) 0.4 mg, Sublingual, Every 5 min PRN   OCEANIC-STROKE asundexian or placebo 50 mg, Oral, Daily, For Investigational Use Only. Take at the same time each day (preferably in the morning). Tablet  should be swallowed whole with water; it CANNOT be crushed or broken.   oxyCODONE  (OXY IR/ROXICODONE ) 5 mg, Oral, Daily PRN   predniSONE  (DELTASONE ) 20 mg, Oral, Daily   rOPINIRole (REQUIP) 1 MG tablet Take by mouth.   sertraline  (ZOLOFT ) 50 mg, Oral, Daily, Takes 1/2 a pill a day.   tamsulosin (FLOMAX) 0.4 MG CAPS capsule Take by mouth.      Allergies       Allergies  Allergen Reactions   Peanut-Containing Drug Products Itching      Walnuts   Morphine  And Codeine        Altered mental status-burning sensation all over          Review of Systems  Constitutional:  Negative for chills, fever and weight loss.  Respiratory:  Negative for shortness of breath.; RECENT BOUT OF BRONCHITIS, TREATED BY PRIMARY CARE WITH INHALERS AND ANTIBIOTICS, HE FEELS BETTER BUT ANESTHESIA DEPT WILL HAVE TO OK PROCEEDING WITH SURGERY    Cardiovascular:  Negative for chest pain.  Musculoskeletal:  Positive for back pain and joint pain.  Neurological:  Negative for tingling.                Past Medical History:  Diagnosis Date   Acute pancreatitis 10/2013   Arthritis     Bulging disc     Cancer (HCC)  Colon   Chronic knee pain     Colostomy in place [facility])     Gallstones     Hypertension     PTSD (post-traumatic stress disorder)     Sciatica     Stroke (HCC) 2023    "light" per pt               Past Surgical History:  Procedure Laterality Date   CHOLECYSTECTOMY       COLON SURGERY       ELBOW SURGERY Left 2024   rectal cancer Right            PHYSICAL EXAM  BP 138/82   Pulse 83   Ht 5' 9.5" (1.765 m)   Wt 180 lb 6.4 oz (81.8 kg)   BMI 26.26 kg/m  GENERAL appearance reveals no gross abnormalities, normal development grooming and hygiene    MENTAL STATUS we note that the patient is awake alert and oriented to person place and time MOOD/AFFECT ARE NORMAL    GAIT reveals positive for limp in the effected limb   Ortho Exam   Right knee no effusion in the knee    Normal flexion but painful Extension 10-0   Medial joint line tenderness   Positive McMurray's for medial joint pain   Ligaments stable   Skin intact     VASC 2+ dorsalis pedis pulse normal capillary refill excellent warmth to the extremity   NEURO normal sensation and no pathologic reflexes   LYMPH deferred noncontributory   MEDICAL DECISION MAKING   A.      Encounter Diagnoses  Name Primary?   Primary osteoarthritis of right knee Yes   Derangement of posterior horn of medial meniscus of right knee     Discoid lateral meniscus of right knee     Opioid use     Remote history of TIA        B. DATA ANALYSED:   IMAGING: Independent interpretation of images:    Imaging #1 right knee mild OA chondrocalcinosis   Image #2  torn medial meniscus  discoid lateral with tear  osteoarthritis right knee  C. MANAGEMENT the patient is interested in surgical treatment at this time.   He was sent here because the [place] wait times were long.  I agree with the notes from the [place] that he could be a candidate for replacement or meniscectomy however, because of his other medical problems investigational drug use for stroke PTSD chronic opioid use I feel more comfortable with arthroscopic surgery first and then if he does not improve [facility]   PLAN  ARTHROSCOPY RIGHT KNEE, PARTIAL MEDIAL MENISECTOMY

## 2024-02-24 ENCOUNTER — Encounter (HOSPITAL_COMMUNITY): Payer: Self-pay | Admitting: Orthopedic Surgery

## 2024-02-24 ENCOUNTER — Ambulatory Visit (HOSPITAL_COMMUNITY): Admitting: Anesthesiology

## 2024-02-24 ENCOUNTER — Encounter (HOSPITAL_COMMUNITY)
Admission: RE | Disposition: A | Discharge status: Home / Self Care | Payer: Self-pay | Source: Home / Self Care | Attending: Orthopedic Surgery

## 2024-02-24 ENCOUNTER — Ambulatory Visit (HOSPITAL_COMMUNITY)
Admission: RE | Admit: 2024-02-24 | Discharge: 2024-02-24 | Disposition: A | Discharge status: Home / Self Care | Attending: Orthopedic Surgery | Admitting: Orthopedic Surgery

## 2024-02-24 DIAGNOSIS — S83281D Other tear of lateral meniscus, current injury, right knee, subsequent encounter: Secondary | ICD-10-CM | POA: Diagnosis not present

## 2024-02-24 DIAGNOSIS — S83241A Other tear of medial meniscus, current injury, right knee, initial encounter: Secondary | ICD-10-CM | POA: Diagnosis present

## 2024-02-24 DIAGNOSIS — M1711 Unilateral primary osteoarthritis, right knee: Secondary | ICD-10-CM | POA: Insufficient documentation

## 2024-02-24 DIAGNOSIS — F419 Anxiety disorder, unspecified: Secondary | ICD-10-CM | POA: Insufficient documentation

## 2024-02-24 DIAGNOSIS — M23321 Other meniscus derangements, posterior horn of medial meniscus, right knee: Secondary | ICD-10-CM

## 2024-02-24 DIAGNOSIS — S83241D Other tear of medial meniscus, current injury, right knee, subsequent encounter: Secondary | ICD-10-CM | POA: Diagnosis not present

## 2024-02-24 DIAGNOSIS — Z8673 Personal history of transient ischemic attack (TIA), and cerebral infarction without residual deficits: Secondary | ICD-10-CM | POA: Diagnosis not present

## 2024-02-24 DIAGNOSIS — I10 Essential (primary) hypertension: Secondary | ICD-10-CM

## 2024-02-24 DIAGNOSIS — M94261 Chondromalacia, right knee: Secondary | ICD-10-CM | POA: Diagnosis not present

## 2024-02-24 DIAGNOSIS — F17210 Nicotine dependence, cigarettes, uncomplicated: Secondary | ICD-10-CM

## 2024-02-24 DIAGNOSIS — X58XXXA Exposure to other specified factors, initial encounter: Secondary | ICD-10-CM | POA: Insufficient documentation

## 2024-02-24 DIAGNOSIS — M23341 Other meniscus derangements, anterior horn of lateral meniscus, right knee: Secondary | ICD-10-CM

## 2024-02-24 DIAGNOSIS — S83281A Other tear of lateral meniscus, current injury, right knee, initial encounter: Secondary | ICD-10-CM | POA: Diagnosis present

## 2024-02-24 HISTORY — PX: KNEE ARTHROSCOPY WITH LATERAL MENISECTOMY: SHX6193

## 2024-02-24 SURGERY — ARTHROSCOPY, KNEE, WITH LATERAL MENISCECTOMY
Anesthesia: General | Site: Knee | Laterality: Right

## 2024-02-24 MED ORDER — EPINEPHRINE PF 1 MG/ML IJ SOLN
INTRAMUSCULAR | Status: AC
Start: 2024-02-24 — End: ?
  Filled 2024-02-24: qty 8

## 2024-02-24 MED ORDER — ACETAMINOPHEN 500 MG PO TABS
ORAL_TABLET | ORAL | Status: AC
  Filled 2024-02-24: qty 1

## 2024-02-24 MED ORDER — ORAL CARE MOUTH RINSE: 15.0000 mL | Freq: Once | OROMUCOSAL | Status: DC

## 2024-02-24 MED ORDER — KETOROLAC TROMETHAMINE 30 MG/ML IJ SOLN
INTRAMUSCULAR | Status: AC
  Filled 2024-02-24: qty 1

## 2024-02-24 MED ORDER — ACETAMINOPHEN 500 MG PO TABS
500.0000 mg | ORAL_TABLET | Freq: Once | ORAL | Status: AC
  Administered 2024-02-24: 500 mg via ORAL

## 2024-02-24 MED ORDER — ONDANSETRON HCL 4 MG/2ML IJ SOLN
INTRAMUSCULAR | Status: AC
  Filled 2024-02-24: qty 2

## 2024-02-24 MED ORDER — PROMETHAZINE HCL 12.5 MG PO TABS: 12.5000 mg | ORAL_TABLET | Freq: Four times a day (QID) | ORAL | 0 refills | Status: AC | PRN

## 2024-02-24 MED ORDER — LACTATED RINGERS IV SOLN: INTRAVENOUS | Status: DC | PRN

## 2024-02-24 MED ORDER — ONDANSETRON HCL 4 MG/2ML IJ SOLN: 4.0000 mg | Freq: Once | INTRAMUSCULAR | Status: DC | PRN

## 2024-02-24 MED ORDER — PROPOFOL 10 MG/ML IV BOLUS
INTRAVENOUS | Status: DC | PRN
  Administered 2024-02-24: 200 mg via INTRAVENOUS

## 2024-02-24 MED ORDER — LIDOCAINE 2% (20 MG/ML) 5 ML SYRINGE
INTRAMUSCULAR | Status: DC | PRN
  Administered 2024-02-24: 100 mg via INTRAVENOUS

## 2024-02-24 MED ORDER — CHLORHEXIDINE GLUCONATE 0.12 % MT SOLN: 15.0000 mL | Freq: Once | OROMUCOSAL | Status: DC

## 2024-02-24 MED ORDER — KETOROLAC TROMETHAMINE 15 MG/ML IJ SOLN
INTRAMUSCULAR | Status: AC
  Filled 2024-02-24: qty 1

## 2024-02-24 MED ORDER — PROPOFOL 10 MG/ML IV BOLUS
INTRAVENOUS | Status: AC
  Filled 2024-02-24: qty 20

## 2024-02-24 MED ORDER — FENTANYL CITRATE PF 50 MCG/ML IJ SOSY
PREFILLED_SYRINGE | INTRAMUSCULAR | Status: AC
Start: 2024-02-24 — End: ?
  Filled 2024-02-24: qty 1

## 2024-02-24 MED ORDER — BUPIVACAINE-EPINEPHRINE (PF) 0.5% -1:200000 IJ SOLN
INTRAMUSCULAR | Status: DC | PRN
  Administered 2024-02-24: 30 mL via PERINEURAL

## 2024-02-24 MED ORDER — ONDANSETRON HCL 4 MG/2ML IJ SOLN
4.0000 mg | Freq: Once | INTRAMUSCULAR | Status: AC
  Administered 2024-02-24: 4 mg via INTRAVENOUS

## 2024-02-24 MED ORDER — CEFAZOLIN SODIUM-DEXTROSE 2-4 GM/100ML-% IV SOLN
2.0000 g | INTRAVENOUS | Status: AC
  Administered 2024-02-24: 2 g via INTRAVENOUS
  Filled 2024-02-24: qty 100

## 2024-02-24 MED ORDER — PROPOFOL 10 MG/ML IV BOLUS
INTRAVENOUS | Status: AC
Start: 2024-02-24 — End: ?
  Filled 2024-02-24: qty 20

## 2024-02-24 MED ORDER — BUPIVACAINE-EPINEPHRINE (PF) 0.5% -1:200000 IJ SOLN
INTRAMUSCULAR | Status: AC
Start: 2024-02-24 — End: ?
  Filled 2024-02-24: qty 30

## 2024-02-24 MED ORDER — LACTATED RINGERS IV SOLN: INTRAVENOUS | Status: DC

## 2024-02-24 MED ORDER — SEVOFLURANE IN SOLN
RESPIRATORY_TRACT | Status: AC
Start: 2024-02-24 — End: ?
  Filled 2024-02-24: qty 500

## 2024-02-24 MED ORDER — SODIUM CHLORIDE 0.9 % IR SOLN
Status: DC | PRN
  Administered 2024-02-24 (×2): 3000 mL

## 2024-02-24 MED ORDER — OXYCODONE HCL 5 MG PO TABS: 5.0000 mg | ORAL_TABLET | Freq: Once | ORAL | Status: DC | PRN

## 2024-02-24 MED ORDER — FENTANYL CITRATE PF 50 MCG/ML IJ SOSY
25.0000 ug | PREFILLED_SYRINGE | INTRAMUSCULAR | Status: DC | PRN
  Administered 2024-02-24: 50 ug via INTRAVENOUS

## 2024-02-24 MED ORDER — DEXMEDETOMIDINE HCL IN NACL 80 MCG/20ML IV SOLN
INTRAVENOUS | Status: AC
Start: 2024-02-24 — End: ?
  Filled 2024-02-24: qty 20

## 2024-02-24 MED ORDER — FENTANYL CITRATE (PF) 100 MCG/2ML IJ SOLN
INTRAMUSCULAR | Status: DC | PRN
  Administered 2024-02-24 (×2): 50 ug via INTRAVENOUS

## 2024-02-24 MED ORDER — DEXMEDETOMIDINE HCL IN NACL 80 MCG/20ML IV SOLN
INTRAVENOUS | Status: DC | PRN
  Administered 2024-02-24: 8 ug via INTRAVENOUS

## 2024-02-24 MED ORDER — FENTANYL CITRATE (PF) 100 MCG/2ML IJ SOLN
INTRAMUSCULAR | Status: AC
Start: 2024-02-24 — End: ?
  Filled 2024-02-24: qty 2

## 2024-02-24 MED ORDER — KETOROLAC TROMETHAMINE 30 MG/ML IJ SOLN
15.0000 mg | Freq: Once | INTRAMUSCULAR | Status: AC
  Administered 2024-02-24: 15 mg via INTRAVENOUS

## 2024-02-24 MED ORDER — OXYCODONE HCL 5 MG/5ML PO SOLN: 5.0000 mg | Freq: Once | ORAL | Status: DC | PRN

## 2024-02-24 SURGICAL SUPPLY — 35 items
BNDG ELASTIC 6X5.8 VLCR NS LF (GAUZE/BANDAGES/DRESSINGS) ×2 IMPLANT
CHLORAPREP W/TINT 26 (MISCELLANEOUS) ×2 IMPLANT
CLOTH BEACON ORANGE TIMEOUT ST (SAFETY) ×2 IMPLANT
COOLER ICEMAN CLASSIC (MISCELLANEOUS) ×2 IMPLANT
COUNTER NDL MAGNETIC 40 RED (SET/KITS/TRAYS/PACK) ×2 IMPLANT
COUNTER NEEDLE MAGNETIC 40 RED (SET/KITS/TRAYS/PACK) ×1 IMPLANT
CUFF TRNQT CYL 24X4X16.5-23 (TOURNIQUET CUFF) IMPLANT
GAUZE SPONGE 4X4 12PLY STRL (GAUZE/BANDAGES/DRESSINGS) ×2 IMPLANT
GAUZE XEROFORM 1X8 LF (GAUZE/BANDAGES/DRESSINGS) ×2 IMPLANT
GLOVE BIO SURGEON STRL SZ7 (GLOVE) IMPLANT
GLOVE BIOGEL PI IND STRL 7.0 (GLOVE) ×4 IMPLANT
GLOVE SS N UNI LF 8.5 STRL (GLOVE) ×2 IMPLANT
GLOVE SURG POLYISO LF SZ8 (GLOVE) ×2 IMPLANT
GOWN STRL REUS W/TWL LRG LVL3 (GOWN DISPOSABLE) ×2 IMPLANT
GOWN STRL REUS W/TWL XL LVL3 (GOWN DISPOSABLE) ×2 IMPLANT
KIT TURNOVER CYSTO (KITS) ×2 IMPLANT
MANIFOLD NEPTUNE II (INSTRUMENTS) ×2 IMPLANT
MARKER SKIN DUAL TIP RULER LAB (MISCELLANEOUS) ×2 IMPLANT
NDL HYPO 21X1.5 SAFETY (NEEDLE) ×2 IMPLANT
NEEDLE HYPO 21X1.5 SAFETY (NEEDLE) ×1 IMPLANT
PACK ARTHRO LIMB DRAPE STRL (MISCELLANEOUS) ×2 IMPLANT
PAD ABD 5X9 TENDERSORB (GAUZE/BANDAGES/DRESSINGS) ×2 IMPLANT
PAD ARMBOARD POSITIONER FOAM (MISCELLANEOUS) ×2 IMPLANT
PAD COLD SHLDR SM WRAP-ON (PAD) ×2 IMPLANT
PAD FOR LEG HOLDER (MISCELLANEOUS) ×2 IMPLANT
PADDING CAST COTTON 6X4 STRL (CAST SUPPLIES) ×2 IMPLANT
PORT APPOLLO RF 90DEGREE MULTI (SURGICAL WAND) IMPLANT
POSITIONER HEAD 8X9X4 ADT (SOFTGOODS) ×2 IMPLANT
RESECTOR TORPEDO 4MM 13CM CVD (MISCELLANEOUS) IMPLANT
SET ARTHROSCOPY INST (INSTRUMENTS) ×2 IMPLANT
SET BASIN LINEN APH (SET/KITS/TRAYS/PACK) ×2 IMPLANT
SOL .9 NS 3000ML IRR UROMATIC (IV SOLUTION) ×4 IMPLANT
SUT 3-0 BLK 1X30 PSL (SUTURE) ×2 IMPLANT
SYR 30ML LL (SYRINGE) ×2 IMPLANT
TUBING IN/OUT FLOW W/MAIN PUMP (TUBING) ×2 IMPLANT

## 2024-02-24 NOTE — Op Note (Addendum)
 02/24/2024  8:48 AM  PATIENT:  Clinton Ross  58 y.o. male  PRE-OPERATIVE DIAGNOSIS:  torn medial and lateral meniscus right knee  POST-OPERATIVE DIAGNOSIS:  torn medial and lateral meniscus right knee, osteoarthritis right knee  PROCEDURE:  Procedure(s): ARTHROSCOPY, KNEE, WITH LATERAL MENISCECTOMY AND MEDIAL MENISCECTOMY (Right)  Operative findings  Medial compartment  grade 3 diffuse chondromalacia of the medial femoral condyle Posterior horn medial meniscus tear with meniscal fragment flipped over the tibia into the medial gutter  Notch  ACL PCL intact with mild degeneration of the ACL but all fibers intact  Lateral compartment  anterior horn body tear lateral meniscus  Patellofemoral joint mild chondromalacia grade 1  The surgery was complicated by poor performance of the arthroscopy equipment.  It took several minutes for the equipment to function correctly.  The patient was seen in preop area and reviewed examined and cleared for surgery The surgical site was marked chart review was completed  The patient was then taken to surgery placed on the operating table supine and general anesthesia brief exam under anesthesia which revealed all stable ligaments and full range of motion this was followed by prep and drape of the right lower extremity  After sterile prep and drape timeout was completed surgical site confirmed patient confirmed allergies reviewed images available and reviewed  Lateral portal was injected as was the medial portal with Marcaine and epinephrine a lateral portal was established the scope was placed into the joint and a diagnostic arthroscopy was performed by reviewing all of the surfaces of the joint  I have listed the findings above.  I placed the medial spinal needle and we immediately noted that the knee was very tight and the medial compartment was not opening up well.  A 11 blade was used to open the medial portal followed by introduction of a blunt  device to widen it and then a 90 degree wand was used to release the inner portion of the MCL.  Even with this the knee was still very tight  However, using a probe we were able to dislodge the torn meniscal fragment from the gutter and removed with a shaver.  We then took the 90 degree wand and contoured the meniscus into a stable rim which was confirmed with the probe  He had a chondral flap tear which needed to be debrided and this was done with a shaver.  We then used a straight biter to resect the torn meniscal fragments laterally after placing the leg in the figure-of-four position.  A shaver was used to smooth off the edges of the inner rim of the lateral meniscus  We then thoroughly irrigated the joint  The joint was suctioned to remove any fluid and debris.  We closed the portals with 3-0 nylon sutures we injected the joint with 30 cc of Marcaine with epinephrine  The knee was checked in extension and 30 degrees of flexion and there was no laxity of the MCL  Sterile bandages were applied as well is an Ace bandage along with Cryo/Cuff  After extubation patient was taken recovery room in stable condition   SURGEON:  Surgeons and Role:    * Darrin Emerald, MD - Primary  PHYSICIAN ASSISTANT:   ASSISTANTS: none   ANESTHESIA:   general  EBL:  5 mL   BLOOD ADMINISTERED:none  DRAINS: none   LOCAL MEDICATIONS USED:  MARCAINE     SPECIMEN:  No Specimen  DISPOSITION OF SPECIMEN:  N/A  COUNTS:  YES  TOURNIQUET:  * Missing tourniquet times found for documented tourniquets in log: [id number] *  DICTATION: .Dragon Dictation  PLAN OF CARE: Discharge to home after PACU  PATIENT DISPOSITION:  PACU - hemodynamically stable.   Delay start of Pharmacological VTE agent (>24hrs) due to surgical blood loss or risk of bleeding: not applicable  Postop plan  Weight-bear as tolerated Cryo/Cuff Follow-up in a week  Prognosis guarded secondary to the arthritis in the medial  compartment  We will be aware of any pain medially from the MCL release but there is no laxity on exam

## 2024-02-24 NOTE — Interval H&P Note (Signed)
 History and Physical Interval Note:  02/24/2024 7:21 AM  Clinton Ross  has presented today for surgery, with the diagnosis of torn medial and lateral meniscus right knee.  The various methods of treatment have been discussed with the patient and family. After consideration of risks, benefits and other options for treatment, the patient has consented to  Procedure(s): ARTHROSCOPY, KNEE, WITH LATERAL MENISCECTOMY AND MEDIAL MENISCECTOMY (Right) as a surgical intervention.  The patient's history has been reviewed, patient examined, no change in status, stable for surgery.  I have reviewed the patient's chart and labs.  Questions were answered to the patient's satisfaction.     Elsa Halls

## 2024-02-24 NOTE — Transfer of Care (Signed)
 Immediate Anesthesia Transfer of Care Note  Patient: Clinton Ross  Procedure(s) Performed: ARTHROSCOPY, KNEE, WITH LATERAL MENISCECTOMY AND MEDIAL MENISCECTOMY (Right: Knee)  Patient Location: PACU  Anesthesia Type:General  Level of Consciousness: drowsy and patient cooperative  Airway & Oxygen  Therapy: Patient Spontanous Breathing and Patient connected to nasal cannula oxygen   Post-op Assessment: Report given to RN and Post -op Vital signs reviewed and stable  Post vital signs: Reviewed and stable  Last Vitals:  Vitals Value Taken Time  BP 104/66 02/24/24 0853  Temp 36.5 C 02/24/24 0853  Pulse 72 02/24/24 0854  Resp 13 02/24/24 0854  SpO2 98 % 02/24/24 0854  Vitals shown include unfiled device data.  Last Pain:  Vitals:   02/24/24 0644  TempSrc: Oral  PainSc: 5       Patients Stated Pain Goal: 5 (02/24/24 6578)  Complications: No notable events documented.

## 2024-02-24 NOTE — Anesthesia Preprocedure Evaluation (Signed)
 Anesthesia Evaluation  Patient identified by MRN, date of birth, ID band Patient awake    Reviewed: Allergy & Precautions, H&P , NPO status , Patient's Chart, lab work & pertinent test results, reviewed documented beta blocker date and time   Airway Mallampati: II  TM Distance: >3 FB Neck ROM: full    Dental no notable dental hx.    Pulmonary neg pulmonary ROS, sleep apnea , Current Smoker and Patient abstained from smoking.   Pulmonary exam normal breath sounds clear to auscultation       Cardiovascular Exercise Tolerance: Good hypertension, negative cardio ROS  Rhythm:regular Rate:Normal     Neuro/Psych  PSYCHIATRIC DISORDERS Anxiety      Neuromuscular disease CVA negative neurological ROS  negative psych ROS   GI/Hepatic negative GI ROS, Neg liver ROS,,,  Endo/Other  negative endocrine ROS    Renal/GU negative Renal ROS  negative genitourinary   Musculoskeletal   Abdominal   Peds  Hematology negative hematology ROS (+)   Anesthesia Other Findings   Reproductive/Obstetrics negative OB ROS                             Anesthesia Physical Anesthesia Plan  ASA: 3  Anesthesia Plan: General and General LMA   Post-op Pain Management:    Induction:   PONV Risk Score and Plan: Ondansetron   Airway Management Planned:   Additional Equipment:   Intra-op Plan:   Post-operative Plan:   Informed Consent: I have reviewed the patients History and Physical, chart, labs and discussed the procedure including the risks, benefits and alternatives for the proposed anesthesia with the patient or authorized representative who has indicated his/her understanding and acceptance.     Dental Advisory Given  Plan Discussed with: CRNA  Anesthesia Plan Comments:        Anesthesia Quick Evaluation

## 2024-02-24 NOTE — Anesthesia Procedure Notes (Signed)
 Procedure Name: LMA Insertion Date/Time: 02/24/2024 7:37 AM  Performed by: Verline Glow, CRNAPre-anesthesia Checklist: Patient identified, Patient being monitored, Emergency Drugs available, Timeout performed and Suction available Patient Re-evaluated:Patient Re-evaluated prior to induction Oxygen  Delivery Method: Circle System Utilized Preoxygenation: Pre-oxygenation with 100% oxygen  Induction Type: IV induction Ventilation: Mask ventilation without difficulty LMA: LMA inserted LMA Size: 4.0 Number of attempts: 1 Placement Confirmation: positive ETCO2 and breath sounds checked- equal and bilateral

## 2024-02-24 NOTE — Brief Op Note (Addendum)
 02/24/2024  8:48 AM  PATIENT:  Clinton Ross  58 y.o. 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  It took several minutes for the equipment to function correctly.  The patient was seen in preop area and reviewed examined and cleared for surgery The surgical site was marked chart review was completed  The patient was then taken to surgery placed on the operating table supine and general anesthesia brief exam under anesthesia which revealed all stable ligaments and full range of motion this was followed by prep and drape of the right lower extremity  After sterile prep and drape timeout was completed surgical site confirmed patient confirmed allergies reviewed images available and reviewed  Lateral portal was injected as was the medial portal with Marcaine and epinephrine a lateral portal was established the scope was placed into the joint and a diagnostic arthroscopy was performed by reviewing all of the surfaces of the joint  I have listed the findings above.  I placed the medial spinal needle and we immediately noted that the knee was very tight and the medial compartment was not opening up well.  A 11 blade was used to open the medial portal followed by introduction of a blunt  device to widen it and then a 90 degree wand was used to release the inner portion of the MCL.  Even with this the knee was still very tight  However, using a probe we were able to dislodge the torn meniscal fragment from the gutter and removed with a shaver.  We then took the 90 degree wand and contoured the meniscus into a stable rim which was confirmed with the probe  He had a chondral flap tear which needed to be debrided and this was done with a shaver.  We then used a straight biter to resect the torn meniscal fragments laterally after placing the leg in the figure-of-four position.  A shaver was used to smooth off the edges of the inner rim of the lateral meniscus  We then thoroughly irrigated the joint  The joint was suctioned to remove any fluid and debris.  We closed the portals with 3-0 nylon sutures we injected the joint with 30 cc of Marcaine with epinephrine  The knee was checked in extension and 30 degrees of flexion and there was no laxity of the MCL  Sterile bandages were applied as well is an Ace bandage along with Cryo/Cuff  After extubation patient was taken recovery room in stable condition   SURGEON:  Surgeons and Role:    * Darrin Emerald, MD - Primary  PHYSICIAN ASSISTANT:   ASSISTANTS: none   ANESTHESIA:   general  EBL:  5 mL   BLOOD ADMINISTERED:none  DRAINS: none   LOCAL MEDICATIONS USED:  MARCAINE     SPECIMEN:  No Specimen  DISPOSITION OF SPECIMEN:  N/A  COUNTS:  YES  TOURNIQUET:  * Missing tourniquet times found for documented tourniquets in log: [id number] *  DICTATION: .Dragon Dictation  PLAN OF CARE: Discharge to home after PACU  PATIENT DISPOSITION:  PACU - hemodynamically stable.   Delay start of Pharmacological VTE agent (>24hrs) due to surgical blood loss or risk of bleeding: not applicable  Postop plan  Weight-bear as tolerated Cryo/Cuff Follow-up in a week  Prognosis guarded secondary to the arthritis in the medial  compartment  We will be aware of any pain medially from the MCL release but there is no laxity on exam

## 2024-02-25 ENCOUNTER — Encounter (HOSPITAL_COMMUNITY): Payer: Self-pay | Admitting: Orthopedic Surgery

## 2024-02-26 NOTE — Anesthesia Postprocedure Evaluation (Signed)
 Anesthesia Post Note  Patient: Clinton Ross  Procedure(s) Performed: ARTHROSCOPY, KNEE, WITH LATERAL MENISCECTOMY AND MEDIAL MENISCECTOMY (Right: Knee)  Patient location during evaluation: Phase II Anesthesia Type: General Level of consciousness: awake Pain management: pain level controlled Vital Signs Assessment: post-procedure vital signs reviewed and stable Respiratory status: spontaneous breathing and respiratory function stable Cardiovascular status: blood pressure returned to baseline and stable Postop Assessment: no headache and no apparent nausea or vomiting Anesthetic complications: no Comments: Late entry   No notable events documented.   Last Vitals:  Vitals:   02/24/24 0923 02/24/24 0951  BP:  127/68  Pulse: 73 70  Resp: 18 15  Temp:  36.6 C  SpO2: 99% 91%    Last Pain:  Vitals:   02/25/24 1437  TempSrc:   PainSc: 4                  [name]

## 2024-02-27 ENCOUNTER — Emergency Department (HOSPITAL_COMMUNITY)
Admission: EM | Admit: 2024-02-27 | Discharge: 2024-02-27 | Disposition: A | Discharge status: Home / Self Care | Attending: Emergency Medicine | Admitting: Emergency Medicine

## 2024-02-27 ENCOUNTER — Encounter (HOSPITAL_COMMUNITY): Payer: Self-pay | Admitting: *Deleted

## 2024-02-27 ENCOUNTER — Emergency Department (HOSPITAL_COMMUNITY)

## 2024-02-27 ENCOUNTER — Other Ambulatory Visit: Payer: Self-pay

## 2024-02-27 DIAGNOSIS — Z91010 Allergy to peanuts: Secondary | ICD-10-CM | POA: Diagnosis not present

## 2024-02-27 DIAGNOSIS — M25561 Pain in right knee: Secondary | ICD-10-CM | POA: Diagnosis present

## 2024-02-27 DIAGNOSIS — W1840XA Slipping, tripping and stumbling without falling, unspecified, initial encounter: Secondary | ICD-10-CM | POA: Insufficient documentation

## 2024-02-27 DIAGNOSIS — Y9301 Activity, walking, marching and hiking: Secondary | ICD-10-CM | POA: Insufficient documentation

## 2024-02-27 LAB — SYNOVIAL CELL COUNT + DIFF, W/ CRYSTALS
Crystals, Fluid: NONE SEEN
Eosinophils-Synovial: 0 % (ref 0–1)
Lymphocytes-Synovial Fld: 1 % (ref 0–20)
Monocyte-Macrophage-Synovial Fluid: 1 % — ABNORMAL LOW (ref 50–90)
Neutrophil, Synovial: 98 % — ABNORMAL HIGH (ref 0–25)
WBC, Synovial: 4420 /mm3 — ABNORMAL HIGH (ref 0–200)

## 2024-02-27 MED ORDER — OXYCODONE-ACETAMINOPHEN 5-325 MG PO TABS
2.0000 | ORAL_TABLET | Freq: Once | ORAL | Status: DC
  Filled 2024-02-27: qty 2

## 2024-02-27 MED ORDER — NAPROXEN 250 MG PO TABS
500.0000 mg | ORAL_TABLET | Freq: Once | ORAL | Status: DC
  Filled 2024-02-27: qty 2

## 2024-02-27 MED ORDER — OXYCODONE-ACETAMINOPHEN 5-325 MG PO TABS
1.0000 | ORAL_TABLET | Freq: Once | ORAL | Status: AC
  Administered 2024-02-27: 1 via ORAL

## 2024-02-27 MED ORDER — LIDOCAINE HCL (PF) 1 % IJ SOLN
30.0000 mL | Freq: Once | INTRAMUSCULAR | Status: AC
  Administered 2024-02-27: 30 mL
  Filled 2024-02-27: qty 30

## 2024-02-27 MED ORDER — NAPROXEN 250 MG PO TABS
250.0000 mg | ORAL_TABLET | Freq: Once | ORAL | Status: AC
  Administered 2024-02-27: 250 mg via ORAL

## 2024-02-27 NOTE — ED Triage Notes (Signed)
 Pt had recent right knee surgery and states last night he was getting up to go to bathroom when he tripped over his foot and fell; pt states he did not land on his knee but it bended a different way and he states since then he has been having severe pain  Knee is swollen and he now has limited ROM  Pt went to Dr. Delfino Fellers office but it was closed

## 2024-02-27 NOTE — Discharge Instructions (Signed)
 Please follow-up closely with your orthopedic surgeon on an outpatient basis.  Return to emergency department immediately for any new or worsening symptoms.

## 2024-02-27 NOTE — ED Provider Notes (Signed)
 [place] EMERGENCY DEPARTMENT AT [facility] Provider Note   CSN: [id number] Arrival date & time: 02/27/24  1019     History  Chief Complaint  Patient presents with   Knee Pain    Clinton Ross is a 58 y.o. male.  Patient is a 58 year old male who is 3 days postop arthroscopic meniscal repair who presents emergency department with a chief complaint of severe right knee pain.  Patient notes that last night he was walking to his house when he tripped twisting his right knee.  Patient notes that he is having severe pain at this time which is worse with ambulation.  He notes that the knee is now swollen with limited range of motion.  He did attempt to see his orthopedic surgeon though they were closed.  He denies any numbness or paresthesias distally.  He denies any other long bone or joint pain at this time.   Knee Pain      Home Medications Prior to Admission medications   Medication Sig Start Date End Date Taking? Authorizing Provider  amLODipine  (NORVASC ) 5 MG tablet Take 1 tablet (5 mg total) by mouth daily. Patient taking differently: Take 10 mg by mouth at bedtime. 02/06/22   Kraig Peru, MD  atorvastatin  (LIPITOR) 40 MG tablet Take 1 tablet (40 mg total) by mouth daily. Patient taking differently: Take 40 mg by mouth at bedtime. 02/07/22   Patel, Pranav M, MD  azithromycin (ZITHROMAX) 250 MG tablet Take 250 mg by mouth 2 (two) times daily. 02/10/24   [provider]  benzonatate (TESSALON) 100 MG capsule Take 100 mg by mouth 3 (three) times daily.    [provider]  cetirizine (ZYRTEC) 10 MG tablet Take 10 mg by mouth at bedtime.    [provider]  clopidogrel  (PLAVIX ) 75 MG tablet Take 1 tablet (75 mg total) by mouth daily. 02/07/22   Kraig Peru, MD  cyclobenzaprine  (FLEXERIL ) 10 MG tablet Take 1 tablet (10 mg total) by mouth at bedtime. Patient taking differently: Take 10 mg by mouth daily as needed for muscle spasms. 03/23/20    Wurst, [place], PA-C  fluticasone  (FLONASE ) 50 MCG/ACT nasal spray Place 1 spray into both nostrils 2 (two) times daily. Patient taking differently: Place 2 sprays into both nostrils daily. 10/05/21   Corbin Dess, PA-C  guaifenesin (HUMIBID E) 400 MG TABS tablet Take 400 mg by mouth every 6 (six) hours as needed (bronchitis).    [provider]  ibuprofen  (ADVIL ) 800 MG tablet Take 800 mg by mouth daily as needed for moderate pain (pain score 4-6) or mild pain (pain score 1-3). 07/30/23   [provider]  methylPREDNISolone  (MEDROL ) 4 MG tablet Take 20 mg by mouth daily. 21 day taper    [provider]  oxyCODONE -acetaminophen  (PERCOCET) 5-325 MG tablet Take 1 tablet by mouth every 6 (six) hours as needed for up to 5 days for severe pain (pain score 7-10). 02/23/24 02/28/24  Darrin Emerald, MD  promethazine  (PHENERGAN ) 12.5 MG tablet Take 1 tablet (12.5 mg total) by mouth every 6 (six) hours as needed for nausea or vomiting. 02/24/24   Harrison, Stanley E, MD  rOPINIRole (REQUIP) 1 MG tablet Take 1 mg by mouth at bedtime. 01/30/23   [provider]  sertraline  (ZOLOFT ) 50 MG tablet Take 50 mg by mouth daily in the afternoon.    [provider]  Study Marcille Severance - asundexian 50 mg or placebo tablet (PI-Sethi)  Take 1 tablet (50 mg total) by mouth daily. For Investigational Use Only. Take at the same time each day (preferably in the morning). Tablet should be swallowed whole with water; it CANNOT be crushed or broken. 02/10/24   Sethi, Pramod S, MD  tamsulosin (FLOMAX) 0.4 MG CAPS capsule Take 0.8 mg by mouth at bedtime. 12/17/23   [provider]      Allergies    Peanut-containing drug products and Morphine     Review of Systems   Review of Systems  Musculoskeletal:  Positive for joint swelling.  All other systems reviewed and are negative.   Physical Exam Updated Vital Signs BP 120/85   Pulse 99   Temp 97.6 F (36.4 C) (Oral)    Resp 20   Ht 5\' 10"  (1.778 m)   Wt 83.5 kg   SpO2 96%   BMI 26.41 kg/m  Physical Exam Vitals and nursing note reviewed.  Constitutional:      Appearance: Normal appearance.  HENT:     Head: Normocephalic and atraumatic.     Nose: Nose normal.     Mouth/Throat:     Mouth: Mucous membranes are moist.  Eyes:     Extraocular Movements: Extraocular movements intact.     Conjunctiva/sclera: Conjunctivae normal.     Pupils: Pupils are equal, round, and reactive to light.  Cardiovascular:     Rate and Rhythm: Normal rate and regular rhythm.     Pulses: Normal pulses.     Heart sounds: Normal heart sounds.  Pulmonary:     Effort: Pulmonary effort is normal. No respiratory distress.  Musculoskeletal:     Cervical back: Normal range of motion and neck supple.     Comments: To palpation over the right knee diffusely, surgical sites with no surrounding erythema or purulent discharge, nontender palpation over right hip, right ankle, right foot, DP and PT pulses are 2+ distally, sensation intact distally, limited active and passive range of motion to right knee, moderate edema noted to the right knee, minimal overlying erythema and warmth, no skin breakdown or ulceration, no lacerations or abrasions, pelvis stable to AP lateral compression, no obvious deformity  Skin:    General: Skin is warm and dry.  Neurological:     General: No focal deficit present.     Mental Status: He is alert and oriented to person, place, and time. Mental status is at baseline.  Psychiatric:        Mood and Affect: Mood normal.        Behavior: Behavior normal.        Thought Content: Thought content normal.        Judgment: Judgment normal.     ED Results / Procedures / Treatments   Labs (all labs ordered are listed, but only abnormal results are displayed) Labs Reviewed - No data to display  EKG None  Radiology No results found.  Procedures Procedures    Medications Ordered in ED Medications   oxyCODONE -acetaminophen  (PERCOCET/ROXICET) 5-325 MG per tablet 2 tablet (has no administration in time range)  naproxen  (NAPROSYN ) tablet 500 mg (has no administration in time range)    ED Course/ Medical Decision Making/ A&P                                 Medical Decision Making Amount and/or Complexity of Data Reviewed Labs: ordered. Radiology: ordered.  Risk Prescription drug management.   This patient  presents to the ED for concern of left knee pain differential diagnosis includes sprain, fracture, septic joint, gout    Additional history obtained:  Additional history obtained from medical records External records from outside source obtained and reviewed including medical records   Lab Tests:  I Ordered, and personally interpreted labs.  The pertinent results include: Cell count differential with no crystals, white blood cells at 4400   Imaging Studies ordered:  I ordered imaging studies including x-ray of right knee I independently visualized and interpreted imaging which showed moderate effusion, no acute osseous injury or lesions I agree with the radiologist interpretation   Medicines ordered and prescription drug management:  I ordered medication including Percocet, naproxen  for acute pain Reevaluation of the patient after these medicines showed that the patient improved I have reviewed the patients home medicines and have made adjustments as needed   Problem List / ED Course:  Patient is doing better at this time and is stable for discharge home.  He is ambulating around the emergency department without difficulty at this point.  Arthrocentesis was performed by my attending physician and fluid demonstrates no indication for septic joint or gout.  Do not state any further advanced imaging of the knee is warranted at this time.  Do suspect knee sprain.  Will recommend continued close follow-up with his orthopedic fighter on outpatient basis.  Patient already  has pain medication at home and will continue taking this.  Strict turn precautions were discussed for any new or worsening symptoms.  Patient voiced understanding and had no additional questions.   Social Determinants of Health:  None           Final Clinical Impression(s) / ED Diagnoses Final diagnoses:  None    Rx / DC Orders ED Discharge Orders     None         Roselynn Connors, PA-C 02/27/24 1515    Jerilynn Montenegro, MD 03/02/24 [id number]

## 2024-02-27 NOTE — ED Provider Notes (Signed)
.  Joint Aspiration/Arthrocentesis  Date/Time: 02/27/2024 12:41 PM  Performed by: Jerilynn Montenegro, MD Authorized by: Jerilynn Montenegro, MD   Consent:    Consent obtained:  Verbal   Consent given by:  Patient   Risks, benefits, and alternatives were discussed: yes   Universal protocol:    Patient identity confirmed:  Verbally with patient Location:    Location:  Knee   Knee:  R knee Anesthesia:    Anesthesia method:  Local infiltration Procedure details:    Preparation: Patient was prepped and draped in usual sterile fashion     Needle gauge:  18 G   Approach: superior-lateral.   Aspirate amount:  30 mL   Aspirate characteristics:  Bloody   Specimen collected: yes   Post-procedure details:    Dressing:  Adhesive bandage   Procedure completion:  Tolerated well, no immediate complications     Jerilynn Montenegro, MD 02/27/24 1242

## 2024-02-29 LAB — GLUCOSE, BODY FLUID OTHER: Glucose, Body Fluid Other: 117 mg/dL

## 2024-02-29 LAB — PROTEIN, BODY FLUID (OTHER): Total Protein, Body Fluid Other: 3.6 g/dL

## 2024-03-01 LAB — BODY FLUID CULTURE W GRAM STAIN: Culture: NO GROWTH

## 2024-03-03 ENCOUNTER — Encounter: Payer: Self-pay | Admitting: Orthopedic Surgery

## 2024-03-03 ENCOUNTER — Ambulatory Visit (INDEPENDENT_AMBULATORY_CARE_PROVIDER_SITE_OTHER): Admitting: Orthopedic Surgery

## 2024-03-03 DIAGNOSIS — Z9889 Other specified postprocedural states: Secondary | ICD-10-CM

## 2024-03-03 NOTE — Progress Notes (Signed)
 Chief Complaint  Patient presents with   Post-op Follow-up    Clinton Ross comes in today for his post postop visit surgery date was February 24, 2024  He stumbled onto his cane when he was trying to get up and presented to the ER with an effusion on 6 June several days postop  Aspiration of bloody fluid was obtained culture was negative no crystals were noted no signs of infection  He is much better today after aspiration  Most of his pain is medially he has a small joint effusion he can almost fully extend the knee he is lacking maybe 5 degrees he can flex it to about 105 degrees  I reviewed his arthroscopy pictures he has OA on the medial side we did a partial medial meniscectomy he also has a lateral meniscus tear which was debrided  I told him he needs to take it easy as he asked me if it was okay if he mowed his lawn which he did with a push mower  I reviewed that his prognosis is guarded because he has osteoarthritis and torn meniscus on the same side, I reviewed that these are the knees that are most difficult to recover from  Home exercises reviewed with the patient  Return in 3 weeks  -- PRE-OPERATIVE DIAGNOSIS:  torn medial and lateral meniscus right knee   POST-OPERATIVE DIAGNOSIS:  torn medial and lateral meniscus right knee, osteoarthritis right knee   PROCEDURE:  Procedure(s): ARTHROSCOPY, KNEE, WITH LATERAL MENISCECTOMY AND MEDIAL MENISCECTOMY (Right)   Operative findings   Medial compartment  grade 3 diffuse chondromalacia of the medial femoral condyle Posterior horn medial meniscus tear with meniscal fragment flipped over the tibia into the medial gutter   Notch  ACL PCL intact with mild degeneration of the ACL but all fibers intact   Lateral compartment  anterior horn body tear lateral meniscus   Patellofemoral joint mild chondromalacia grade 1

## 2024-03-03 NOTE — Progress Notes (Signed)
   There were no vitals taken for this visit.  There is no height or weight on file to calculate BMI.  Chief Complaint  Patient presents with   Post-op Follow-up    Encounter Diagnosis  Name Primary?   S/P right knee arthroscopy 02/24/24 Yes    DOI/DOS/ Date: 02/24/24  Unchanged He states he fell post op / knee gave out he stumbled, did not land directly on the knee   He has scope pictures wants you to review with him

## 2024-03-05 ENCOUNTER — Other Ambulatory Visit: Payer: Self-pay | Admitting: Orthopedic Surgery

## 2024-03-05 ENCOUNTER — Telehealth: Payer: Self-pay | Admitting: Orthopedic Surgery

## 2024-03-05 DIAGNOSIS — G8918 Other acute postprocedural pain: Secondary | ICD-10-CM

## 2024-03-05 MED ORDER — HYDROCODONE-ACETAMINOPHEN 5-325 MG PO TABS: 1.0000 | ORAL_TABLET | Freq: Four times a day (QID) | ORAL | 0 refills | Status: AC | PRN

## 2024-03-05 NOTE — Telephone Encounter (Signed)
 Dr. Delfino Fellers pt - pt lvm stating that he would like a script for pain, he stated that the pain is like a toothache and he can't sleep at night.  [phone]

## 2024-03-08 NOTE — Telephone Encounter (Signed)
 Called the patient and advised, he verbalized understanding

## 2024-03-25 ENCOUNTER — Ambulatory Visit (INDEPENDENT_AMBULATORY_CARE_PROVIDER_SITE_OTHER): Admitting: Orthopedic Surgery

## 2024-03-25 DIAGNOSIS — Z9889 Other specified postprocedural states: Secondary | ICD-10-CM

## 2024-03-25 DIAGNOSIS — M23321 Other meniscus derangements, posterior horn of medial meniscus, right knee: Secondary | ICD-10-CM

## 2024-03-25 DIAGNOSIS — G8918 Other acute postprocedural pain: Secondary | ICD-10-CM

## 2024-03-25 DIAGNOSIS — M1711 Unilateral primary osteoarthritis, right knee: Secondary | ICD-10-CM

## 2024-03-25 NOTE — Progress Notes (Signed)
   There were no vitals taken for this visit.  There is no height or weight on file to calculate BMI.  Chief Complaint  Patient presents with   Routine Post Op    R Knee Schleicher opeDOS: 02/24/24    Encounter Diagnoses  Name Primary?   S/P right knee arthroscopy 02/24/24    Primary osteoarthritis of right knee Yes   Postoperative pain    Derangement of posterior horn of medial meniscus of right knee     DOI/DOS/ Date: 02/24/24  Anterior knee pain and popping around the patella   1 month post op   He had torn medial and lateral meniscus with grade III chondromalacia in the medial femoral condyle chondromalacia in the patellofemoral joint as well.  He is complaining of pain at the superior pole of patella primarily with some mild medial epicondyle pain, medial femoral condyle and posteromedial knee not having symptoms.  He does have a small effusion.  He can full fully extend the knee but says he is having trouble with his quad sets  Looks like he is having some quad weakness recommend physical therapy  He is on Plavix   If necessary he can still take some hydrocodone  every 8  Return 1 month again guarded prognosis

## 2024-03-25 NOTE — Progress Notes (Signed)
   There were no vitals taken for this visit.  There is no height or weight on file to calculate BMI.  Chief Complaint  Patient presents with   Routine Post Op    R Knee Weston opeDOS: 02/24/24    Encounter Diagnosis  Name Primary?   S/P right knee arthroscopy 02/24/24 Yes    DOI/DOS/ Date: 02/24/24  Anterior knee pain and popping around the patella

## 2024-04-26 ENCOUNTER — Encounter: Payer: Self-pay | Admitting: Orthopedic Surgery

## 2024-04-26 ENCOUNTER — Ambulatory Visit (INDEPENDENT_AMBULATORY_CARE_PROVIDER_SITE_OTHER): Admitting: Orthopedic Surgery

## 2024-04-26 DIAGNOSIS — Z9889 Other specified postprocedural states: Secondary | ICD-10-CM

## 2024-04-26 NOTE — Progress Notes (Signed)
   There were no vitals taken for this visit.  There is no height or weight on file to calculate BMI.  Chief Complaint  Patient presents with   Post-op Follow-up    Encounter Diagnosis  Name Primary?   S/P right knee arthroscopy 02/24/24 Yes    DOI/DOS/ Date: 03/15/24  Improved

## 2024-04-26 NOTE — Progress Notes (Signed)
  Chief Complaint  Patient presents with   Post-op Follow-up    Encounter Diagnosis  Name Primary?   S/P right knee arthroscopy 02/24/24 Yes    DOI/DOS/ Date: 03/15/24  Improved  Clinton Ross is doing well he has no complaints  His knee looks good no swelling full extension full flexion good quadriceps control  Return as needed

## 2024-06-19 ENCOUNTER — Encounter: Payer: Self-pay | Admitting: Emergency Medicine

## 2024-06-19 ENCOUNTER — Ambulatory Visit: Admission: EM | Admit: 2024-06-19 | Discharge: 2024-06-19 | Disposition: A | Discharge status: Home / Self Care

## 2024-06-19 DIAGNOSIS — R591 Generalized enlarged lymph nodes: Secondary | ICD-10-CM | POA: Diagnosis not present

## 2024-06-19 DIAGNOSIS — H66001 Acute suppurative otitis media without spontaneous rupture of ear drum, right ear: Secondary | ICD-10-CM

## 2024-06-19 MED ORDER — AMOXICILLIN-POT CLAVULANATE 875-125 MG PO TABS: 1.0000 | ORAL_TABLET | Freq: Two times a day (BID) | ORAL | 0 refills | Status: AC

## 2024-06-19 NOTE — ED Triage Notes (Signed)
 Right ear pain since Sunday and right side of neck feels stiff since this morning.  [facility] and was given [facility] drops.

## 2024-06-19 NOTE — Discharge Instructions (Addendum)
 Take antibiotic as prescribed. Take tylenol  1,000mg  every 6 hours as needed for pain.  Use warm compresses to the right neck to help with pain and swelling.   If you develop any new or worsening symptoms or if your symptoms do not start to improve, please return here or follow-up with your primary care provider. If your symptoms are severe, please go to the emergency room.

## 2024-06-19 NOTE — ED Provider Notes (Signed)
 [facility]    CSN: [id number] Arrival date & time: 06/19/24  1230      History   Chief Complaint No chief complaint on file.   HPI Clinton Ross is a 58 y.o. male.   Clinton Ross is a 58 y.o. male presenting for chief complaint of right ear pain that started 5 days ago.  He was seen by his PCP at initial onset of symptoms who felt right ear pain to be due to an ear infection.  He was prescribed an antibiotic otic drop and has been using this without relief over the last 5 days.  Pain worsened yesterday.  Pain of the right ear is now radiating into the right neck and preauricular region.  Denies recent trauma/injuries to the right ear, drainage from the right ear, tinnitus, dizziness, fever, chills, midline neck pain, shoulder pain, and headache.  Denies recent antibiotic or steroid use in the last 90 days.  No cough or congestion reported.  Denies sore throat.  Taking Tylenol  with minimal relief of symptoms.     Past Medical History:  Diagnosis Date   Acute pancreatitis 10/2013   Arthritis    Bulging disc    Cancer (HCC)    Colon   Chronic knee pain    Colostomy in place [facility])    Gallstones    Hypertension    PTSD (post-traumatic stress disorder)    Sciatica    Sleep apnea    Stroke (HCC) 2023   light per pt    Patient Active Problem List   Diagnosis Date Noted   Idiopathic peripheral neuropathy 01/14/2024   History of cerebrovascular accident 01/14/2024   Erectile dysfunction 01/14/2024   Chronic low back pain 01/14/2024   Long-term current use of opiate analgesic 01/14/2024   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   Marijuana user 01/14/2024   Medial epicondylitis, left elbow 01/14/2024   Myopia, bilateral 01/14/2024   Internal carotid artery stenosis, right 02/08/2022   Hyperlipidemia 02/08/2022   Marijuana abuse 02/08/2022   Acute ischemic stroke (HCC) 02/05/2022   Pancreatitis 06/08/2018   Chest pain 07/21/2016   Cholelithiasis 11/28/2013    Tobacco abuse 11/28/2013   Abdominal pain 11/27/2013   Acute pancreatitis 11/20/2013   Constipation 11/20/2013   Essential hypertension 11/20/2013   PTSD (post-traumatic stress disorder) 11/20/2013   Sciatica 05/24/2009   DERANGEMENT MENISCUS 10/24/2008   JOINT EFFUSION, KNEE 10/24/2008   KNEE PAIN 10/24/2008    Past Surgical History:  Procedure Laterality Date   CHOLECYSTECTOMY     COLON SURGERY     ELBOW SURGERY Left 2024   KNEE ARTHROSCOPY WITH LATERAL MENISECTOMY Right 02/24/2024   Procedure: ARTHROSCOPY, KNEE, WITH LATERAL MENISCECTOMY AND MEDIAL MENISCECTOMY;  Surgeon: Margrette Taft BRAVO, MD;  Location: [facility];  Service: Orthopedics;  Laterality: Right;   rectal cancer Right        Home Medications    Prior to Admission medications   Medication Sig Start Date End Date Taking? Authorizing Provider  amoxicillin -clavulanate (AUGMENTIN) 875-125 MG tablet Take 1 tablet by mouth every 12 (twelve) hours. 06/19/24  Yes Enedelia Dorna CHRISTELLA, FNP  HYDROcodone -acetaminophen  (NORCO) 7.5-325 MG tablet Take 1 tablet by mouth every 6 (six) hours as needed for moderate pain (pain score 4-6).   Yes [provider]  amLODipine  (NORVASC ) 5 MG tablet Take 1 tablet (5 mg total) by mouth daily. Patient taking differently: Take 10 mg by mouth at bedtime. 02/06/22   Tobie Yetta CHRISTELLA, MD  atorvastatin  (  LIPITOR) 40 MG tablet Take 1 tablet (40 mg total) by mouth daily. Patient taking differently: Take 40 mg by mouth at bedtime. 02/07/22   Patel, Pranav M, MD  cetirizine (ZYRTEC) 10 MG tablet Take 10 mg by mouth at bedtime.    [provider]  clopidogrel  (PLAVIX ) 75 MG tablet Take 1 tablet (75 mg total) by mouth daily. 02/07/22   Tobie Yetta HERO, MD  cyclobenzaprine  (FLEXERIL ) 10 MG tablet Take 1 tablet (10 mg total) by mouth at bedtime. Patient taking differently: Take 10 mg by mouth daily as needed for muscle spasms. 03/23/20   Wurst, [place], PA-C  fluticasone  (FLONASE ) 50 MCG/ACT  nasal spray Place 1 spray into both nostrils 2 (two) times daily. Patient taking differently: Place 2 sprays into both nostrils daily. 10/05/21   Stuart Vernell Norris, PA-C  guaifenesin (HUMIBID E) 400 MG TABS tablet Take 400 mg by mouth every 6 (six) hours as needed (bronchitis).    [provider]  ibuprofen  (ADVIL ) 800 MG tablet Take 800 mg by mouth daily as needed for moderate pain (pain score 4-6) or mild pain (pain score 1-3). 07/30/23   [provider]  promethazine  (PHENERGAN ) 12.5 MG tablet Take 1 tablet (12.5 mg total) by mouth every 6 (six) hours as needed for nausea or vomiting. 02/24/24   Harrison, Stanley E, MD  rOPINIRole (REQUIP) 1 MG tablet Take 1 mg by mouth at bedtime. 01/30/23   [provider]  sertraline  (ZOLOFT ) 50 MG tablet Take 50 mg by mouth daily in the afternoon.    [provider]  Study - OCEANIC-STROKE - asundexian 50 mg or placebo tablet (PI-Sethi) Take 1 tablet (50 mg total) by mouth daily. For Investigational Use Only. Take at the same time each day (preferably in the morning). Tablet should be swallowed whole with water; it CANNOT be crushed or broken. 02/10/24   Sethi, Pramod S, MD  tamsulosin (FLOMAX) 0.4 MG CAPS capsule Take 0.8 mg by mouth at bedtime. 12/17/23   [provider]    Family History Family History  Problem Relation Age of Onset   Hypertension Mother    Stroke Father    Diabetes Other     Social History Social History   Tobacco Use   Smoking status: Every Day    Current packs/day: 0.50    Average packs/day: 0.5 packs/day for 30.0 years (15.0 ttl pk-yrs)    Types: Cigarettes   Smokeless tobacco: Never  Vaping Use   Vaping status: Never Used  Substance Use Topics   Alcohol use: No   Drug use: No     Allergies   Peanut-containing drug products and Morphine    Review of Systems Review of Systems Per HPI  Physical Exam Triage Vital Signs ED Triage Vitals  Encounter Vitals Group     BP  06/19/24 1251 (!) 144/85     Girls Systolic BP Percentile --      Girls Diastolic BP Percentile --      Boys Systolic BP Percentile --      Boys Diastolic BP Percentile --      Pulse Rate 06/19/24 1251 74     Resp 06/19/24 1251 20     Temp 06/19/24 1251 98.4 F (36.9 C)     Temp Source 06/19/24 1251 Oral     SpO2 06/19/24 1251 97 %     Weight --      Height --      Head Circumference --  Peak Flow --      Pain Score 06/19/24 1252 8     Pain Loc --      Pain Education --      Exclude from Growth Chart --    No data found.  Updated Vital Signs BP (!) 144/85 (BP Location: Left Arm)   Pulse 74   Temp 98.4 F (36.9 C) (Oral)   Resp 20   SpO2 97%   Visual Acuity Right Eye Distance:   Left Eye Distance:   Bilateral Distance:    Right Eye Near:   Left Eye Near:    Bilateral Near:     Physical Exam Vitals and nursing note reviewed.  Constitutional:      Appearance: He is not ill-appearing or toxic-appearing.  HENT:     Head: Normocephalic and atraumatic.     Right Ear: Hearing, ear canal and external ear normal. Swelling present. Tympanic membrane is erythematous and bulging. Tympanic membrane is not perforated.     Left Ear: Hearing, tympanic membrane, ear canal and external ear normal.     Nose: Nose normal.     Mouth/Throat:     Lips: Pink.     Mouth: Mucous membranes are moist. No injury or oral lesions.     Dentition: Normal dentition.     Tongue: No lesions.     Pharynx: Oropharynx is clear. Uvula midline. No pharyngeal swelling, oropharyngeal exudate, posterior oropharyngeal erythema, uvula swelling or postnasal drip.     Tonsils: No tonsillar exudate.  Eyes:     General: Lids are normal. Vision grossly intact. Gaze aligned appropriately.     Extraocular Movements: Extraocular movements intact.     Conjunctiva/sclera: Conjunctivae normal.  Neck:     Trachea: Trachea and phonation normal.     Comments: Tender to palpation over the swollen cervical,  occipital, preauricular, and posterior auricular lymph nodes. Cardiovascular:     Rate and Rhythm: Normal rate and regular rhythm.     Heart sounds: Normal heart sounds, S1 normal and S2 normal.  Pulmonary:     Effort: Pulmonary effort is normal. No respiratory distress.     Breath sounds: Normal breath sounds and air entry.  Musculoskeletal:     Cervical back: Normal range of motion and neck supple.  Lymphadenopathy:     Head:     Right side of head: Preauricular, posterior auricular and occipital adenopathy present.     Cervical: Cervical adenopathy present.     Right cervical: Superficial cervical adenopathy present.  Skin:    General: Skin is warm and dry.     Capillary Refill: Capillary refill takes less than 2 seconds.     Findings: No rash.  Neurological:     General: No focal deficit present.     Mental Status: He is alert and oriented to person, place, and time. Mental status is at baseline.     Cranial Nerves: No dysarthria or facial asymmetry.  Psychiatric:        Mood and Affect: Mood normal.        Speech: Speech normal.        Behavior: Behavior normal.        Thought Content: Thought content normal.        Judgment: Judgment normal.      UC Treatments / Results  Labs (all labs ordered are listed, but only abnormal results are displayed) Labs Reviewed - No data to display  EKG   Radiology No results found.  Procedures  Procedures (including critical care time)  Medications Ordered in UC Medications - No data to display  Initial Impression / Assessment and Plan / UC Course  I have reviewed the triage vital signs and the nursing notes.  Pertinent labs & imaging results that were available during my care of the patient were reviewed by me and considered in my medical decision making (see chart for details).   1.  Nonrecurrent acute suppurative otitis media of right ear without spontaneous rupture, lymphadenopathy AOM on exam, eardrum intact.  Advised  to stop topical otic drops and start Augmentin antibiotic twice daily for 7 days. Supportive care discussed for pain and fever management.  Recommend follow-up with PCP in the next 5-7 days for re-check.   Neck pain secondary to lymph node swelling.  Recommend warm compresses and Tylenol  as needed for pain.  Counseled patient on potential for adverse effects with medications prescribed/recommended today, strict ER and return-to-clinic precautions discussed, patient verbalized understanding.    Final Clinical Impressions(s) / UC Diagnoses   Final diagnoses:  Non-recurrent acute suppurative otitis media of right ear without spontaneous rupture of tympanic membrane  Lymphadenopathy     Discharge Instructions      Take antibiotic as prescribed. Take tylenol  1,000mg  every 6 hours as needed for pain.  Use warm compresses to the right neck to help with pain and swelling.   If you develop any new or worsening symptoms or if your symptoms do not start to improve, please return here or follow-up with your primary care provider. If your symptoms are severe, please go to the emergency room.    ED Prescriptions     Medication Sig Dispense Auth. Provider   amoxicillin -clavulanate (AUGMENTIN) 875-125 MG tablet Take 1 tablet by mouth every 12 (twelve) hours. 14 tablet Enedelia Dorna HERO, FNP      PDMP not reviewed this encounter.   Enedelia Dorna HERO, [place] 06/19/24 1346

## 2024-06-21 ENCOUNTER — Emergency Department (HOSPITAL_COMMUNITY)

## 2024-06-21 ENCOUNTER — Emergency Department (HOSPITAL_COMMUNITY)
Admission: EM | Admit: 2024-06-21 | Discharge: 2024-06-21 | Disposition: A | Discharge status: Home / Self Care | Attending: Emergency Medicine | Admitting: Emergency Medicine

## 2024-06-21 ENCOUNTER — Other Ambulatory Visit: Payer: Self-pay

## 2024-06-21 ENCOUNTER — Encounter (HOSPITAL_COMMUNITY): Payer: Self-pay

## 2024-06-21 DIAGNOSIS — Z91010 Allergy to peanuts: Secondary | ICD-10-CM | POA: Diagnosis not present

## 2024-06-21 DIAGNOSIS — Z8673 Personal history of transient ischemic attack (TIA), and cerebral infarction without residual deficits: Secondary | ICD-10-CM | POA: Insufficient documentation

## 2024-06-21 DIAGNOSIS — R748 Abnormal levels of other serum enzymes: Secondary | ICD-10-CM | POA: Insufficient documentation

## 2024-06-21 DIAGNOSIS — Z79899 Other long term (current) drug therapy: Secondary | ICD-10-CM | POA: Insufficient documentation

## 2024-06-21 DIAGNOSIS — H9201 Otalgia, right ear: Secondary | ICD-10-CM | POA: Diagnosis present

## 2024-06-21 DIAGNOSIS — I10 Essential (primary) hypertension: Secondary | ICD-10-CM | POA: Insufficient documentation

## 2024-06-21 DIAGNOSIS — H7091 Unspecified mastoiditis, right ear: Secondary | ICD-10-CM | POA: Insufficient documentation

## 2024-06-21 DIAGNOSIS — Z7902 Long term (current) use of antithrombotics/antiplatelets: Secondary | ICD-10-CM | POA: Insufficient documentation

## 2024-06-21 DIAGNOSIS — M542 Cervicalgia: Secondary | ICD-10-CM | POA: Insufficient documentation

## 2024-06-21 DIAGNOSIS — Z85038 Personal history of other malignant neoplasm of large intestine: Secondary | ICD-10-CM | POA: Insufficient documentation

## 2024-06-21 DIAGNOSIS — R7401 Elevation of levels of liver transaminase levels: Secondary | ICD-10-CM | POA: Insufficient documentation

## 2024-06-21 DIAGNOSIS — R519 Headache, unspecified: Secondary | ICD-10-CM | POA: Insufficient documentation

## 2024-06-21 LAB — CBC WITH DIFFERENTIAL/PLATELET
Abs Immature Granulocytes: 0.03 K/uL (ref 0.00–0.07)
Basophils Absolute: 0.1 K/uL (ref 0.0–0.1)
Basophils Relative: 1 %
Eosinophils Absolute: 0.2 K/uL (ref 0.0–0.5)
Eosinophils Relative: 3 %
HCT: 52.7 % — ABNORMAL HIGH (ref 39.0–52.0)
Hemoglobin: 17.6 g/dL — ABNORMAL HIGH (ref 13.0–17.0)
Immature Granulocytes: 0 %
Lymphocytes Relative: 11 %
Lymphs Abs: 0.9 K/uL (ref 0.7–4.0)
MCH: 29.3 pg (ref 26.0–34.0)
MCHC: 33.4 g/dL (ref 30.0–36.0)
MCV: 87.8 fL (ref 80.0–100.0)
Monocytes Absolute: 0.7 K/uL (ref 0.1–1.0)
Monocytes Relative: 8 %
Neutro Abs: 6.7 K/uL (ref 1.7–7.7)
Neutrophils Relative %: 77 %
Platelets: 213 K/uL (ref 150–400)
RBC: 6 MIL/uL — ABNORMAL HIGH (ref 4.22–5.81)
RDW: 13.1 % (ref 11.5–15.5)
WBC: 8.6 K/uL (ref 4.0–10.5)
nRBC: 0 % (ref 0.0–0.2)

## 2024-06-21 LAB — COMPREHENSIVE METABOLIC PANEL WITH GFR
ALT: 50 U/L — ABNORMAL HIGH (ref 0–44)
AST: 37 U/L (ref 15–41)
Albumin: 3.8 g/dL (ref 3.5–5.0)
Alkaline Phosphatase: 139 U/L — ABNORMAL HIGH (ref 38–126)
Anion gap: 15 (ref 5–15)
BUN: 12 mg/dL (ref 6–20)
CO2: 23 mmol/L (ref 22–32)
Calcium: 9.3 mg/dL (ref 8.9–10.3)
Chloride: 99 mmol/L (ref 98–111)
Creatinine, Ser: 0.82 mg/dL (ref 0.61–1.24)
GFR, Estimated: >60 mL/min (ref >60)
Glucose, Bld: 82 mg/dL (ref 70–99)
Potassium: 3.6 mmol/L (ref 3.5–5.1)
Sodium: 137 mmol/L (ref 135–145)
Total Bilirubin: 0.6 mg/dL (ref 0.0–1.2)
Total Protein: 7.2 g/dL (ref 6.5–8.1)

## 2024-06-21 MED ORDER — CIPROFLOXACIN HCL 750 MG PO TABS: 750.0000 mg | ORAL_TABLET | Freq: Two times a day (BID) | ORAL | 0 refills | Status: AC

## 2024-06-21 MED ORDER — ROPINIROLE HCL 1 MG PO TABS
1.0000 mg | ORAL_TABLET | Freq: Once | ORAL | Status: AC
Start: 2024-06-21 — End: 2024-06-21
  Administered 2024-06-21: 1 mg via ORAL

## 2024-06-21 MED ORDER — AMOXICILLIN-POT CLAVULANATE 875-125 MG PO TABS: 1.0000 | ORAL_TABLET | Freq: Two times a day (BID) | ORAL | 0 refills | Status: AC

## 2024-06-21 MED ORDER — KETOROLAC TROMETHAMINE 15 MG/ML IJ SOLN
15.0000 mg | Freq: Once | INTRAMUSCULAR | Status: AC
  Administered 2024-06-21: 15 mg via INTRAVENOUS
  Filled 2024-06-21: qty 1

## 2024-06-21 MED ORDER — AMOXICILLIN-POT CLAVULANATE 875-125 MG PO TABS
1.0000 | ORAL_TABLET | Freq: Once | ORAL | Status: AC
  Administered 2024-06-21: 1 via ORAL
  Filled 2024-06-21: qty 1

## 2024-06-21 MED ORDER — ACETAMINOPHEN 500 MG PO TABS
1000.0000 mg | ORAL_TABLET | Freq: Once | ORAL | Status: AC
Start: 2024-06-21 — End: 2024-06-21
  Administered 2024-06-21: 1000 mg via ORAL
  Filled 2024-06-21: qty 2

## 2024-06-21 MED ORDER — CIPROFLOXACIN HCL 250 MG PO TABS
750.0000 mg | ORAL_TABLET | Freq: Once | ORAL | Status: AC
Start: 2024-06-21 — End: 2024-06-21
  Administered 2024-06-21: 750 mg via ORAL
  Filled 2024-06-21: qty 3

## 2024-06-21 MED ORDER — FENTANYL CITRATE (PF) 100 MCG/2ML IJ SOLN
50.0000 ug | Freq: Once | INTRAMUSCULAR | Status: AC
  Administered 2024-06-21: 50 ug via INTRAVENOUS
  Filled 2024-06-21: qty 2

## 2024-06-21 MED ORDER — ROPINIROLE HCL ER 4 MG PO TB24
4.0000 mg | ORAL_TABLET | ORAL | Status: DC
  Filled 2024-06-21: qty 1

## 2024-06-21 MED ORDER — OXYCODONE HCL 5 MG PO TABS
5.0000 mg | ORAL_TABLET | Freq: Once | ORAL | Status: AC
  Administered 2024-06-21: 5 mg via ORAL
  Filled 2024-06-21: qty 1

## 2024-06-21 MED ORDER — IOHEXOL 300 MG/ML  SOLN: 75.0000 mL | Freq: Once | INTRAMUSCULAR | Status: DC | PRN

## 2024-06-21 MED ORDER — IOHEXOL 350 MG/ML SOLN
75.0000 mL | Freq: Once | INTRAVENOUS | Status: AC | PRN
  Administered 2024-06-21: 75 mL via INTRAVENOUS

## 2024-06-21 NOTE — Discharge Instructions (Addendum)
 Your CT scan today showed that you have an infection of the right ear that is extending into one of your skull bones.   You have been prescribed an antibiotic called Augmentin to treat this infection.  Please take this twice daily for full 10-day course.  You are already prescribed a 7-day course of this antibiotic.  Please finish that course in its entirety, then start the additional 3-day supply that has been sent to your pharmacy.  You have been prescribed an antibiotic called ciprofloxacin. Take this antibiotic 2 times a day for the next 10 days. Take the full course of your antibiotic even if you start feeling better. Antibiotics may cause you to have diarrhea.  You may take up to 1000mg  of tylenol  every 6 hours as needed for pain.  Do not take more then 4g per day.  You may use up to 600mg  ibuprofen  every 6 hours as needed for pain.  Do not exceed 2.4g of ibuprofen  per day.  You need to follow-up with the ear nose and throat doctor, Dr. Tobie, listed below within the next week for follow-up.  Please call to schedule this appointment.  On your CT scan, they noted an occlusion of one of the arteries in your neck called the ICA.  This appears to be chronic.  Please make your PCP aware this finding.  I have included your CT scan result below for your reference and to show your PCP.  Please return to the ER for any worsening pain, fevers, severe headaches, any other new or concerning symptoms  CT NECK WITH CONTRAST    TECHNIQUE:  Multidetector CT imaging of the neck was performed using the  standard protocol following the bolus administration of intravenous  contrast.    RADIATION DOSE REDUCTION: This exam was performed according to the  departmental dose-optimization program which includes automated  exposure control, adjustment of the mA and/or kV according to  patient size and/or use of iterative reconstruction technique.    CONTRAST:  75mL OMNIPAQUE  IOHEXOL  350 MG/ML SOLN     COMPARISON:  None Available.    FINDINGS:  Pharynx and larynx: Oral cavity within normal limits. Oropharynx and  nasopharynx unremarkable. No retropharyngeal collection. Negative  epiglottis. Hypopharynx and supraglottic larynx within normal  limits. Negative glottis. Subglottic airway clear.    Salivary glands: Subtle asymmetric hyperattenuation of the right  parotid gland as for the left, suspicious for mild and/or early  acute parotitis. No obstructive stone. No abscess. Left parotid  gland and submandibular glands are normal.    Thyroid: Normal.    Lymph nodes: No enlarged or pathologic lymph nodes within the neck.    Vascular: Atherosclerosis.  Chronic right ICA occlusion.    Limited intracranial: Unremarkable.    Visualized orbits: Unremarkable.    Mastoids and visualized paranasal sinuses: Scattered mucosal  thickening about the ethmoidal air cells. Right mastoid and middle  ear effusion. Left mastoid air cells are clear.    Skeleton: No worrisome osseous lesions. Mild-to-moderate spondylosis  at C5-6 and C6-7.    Upper chest: Emphysema.  No other acute finding.    Other: None.    IMPRESSION:  1. Subtle asymmetric hyperattenuation of the right parotid gland as  compared to the left, suspicious for mild and/or early acute  parotitis. No obstructive stone or abscess.  2. Right mastoid and middle ear effusion, suggesting concomitant  otomastoiditis.  3. Chronic right ICA occlusion.    Aortic Atherosclerosis (ICD10-I70.0) and Emphysema (ICD10-J43.9).  Electronically Signed    By: Morene Hoard M.D.    On: 06/21/2024 20:22

## 2024-06-21 NOTE — ED Provider Notes (Signed)
 [place] EMERGENCY DEPARTMENT AT [facility] Provider Note   CSN: [id number] Arrival date & time: 06/21/24  1243     Patient presents with: Otalgia   Clinton Ross is a 58 y.o. male with history of CVA, colon cancer, hypertension, presents with concern for right ear pain and neck pain that started about 5 days ago.  Reports the neck pain has been gradually worsening to the point where he cannot move his neck.  Pain starts in the base of the skull, mostly on the right side, and travels down the right side of his neck.  Denies any true headache or photophobia.  Denies any fever or chills at home.  No nausea or vomiting.  He was prescribed an otic drop he has taken for the past 5 days, as well as Augmentin which he has taken for the past 2 days, without improvement in symptoms.    Otalgia      Prior to Admission medications   Medication Sig Start Date End Date Taking? Authorizing Provider  amoxicillin -clavulanate (AUGMENTIN) 875-125 MG tablet Take 1 tablet by mouth every 12 (twelve) hours. 06/19/24  Yes Enedelia Dorna CHRISTELLA, FNP  amoxicillin -clavulanate (AUGMENTIN) 875-125 MG tablet Take 1 tablet by mouth 2 (two) times daily for 3 days. 06/22/24 06/25/24 Yes Veta Palma, PA-C  ciprofloxacin (CIPRO) 750 MG tablet Take 1 tablet (750 mg total) by mouth 2 (two) times daily for 10 days. 06/22/24 07/02/24 Yes Veta Palma, PA-C  amLODipine  (NORVASC ) 5 MG tablet Take 1 tablet (5 mg total) by mouth daily. Patient taking differently: Take 10 mg by mouth at bedtime. 02/06/22   Tobie Yetta CHRISTELLA, MD  atorvastatin  (LIPITOR) 40 MG tablet Take 1 tablet (40 mg total) by mouth daily. Patient taking differently: Take 40 mg by mouth at bedtime. 02/07/22   Patel, Pranav M, MD  cetirizine (ZYRTEC) 10 MG tablet Take 10 mg by mouth at bedtime.    [provider]  clopidogrel  (PLAVIX ) 75 MG tablet Take 1 tablet (75 mg total) by mouth daily. 02/07/22   Tobie Yetta CHRISTELLA, MD  cyclobenzaprine   (FLEXERIL ) 10 MG tablet Take 1 tablet (10 mg total) by mouth at bedtime. Patient taking differently: Take 10 mg by mouth daily as needed for muscle spasms. 03/23/20   Wurst, [place], PA-C  fluticasone  (FLONASE ) 50 MCG/ACT nasal spray Place 1 spray into both nostrils 2 (two) times daily. Patient taking differently: Place 2 sprays into both nostrils daily. 10/05/21   Stuart Vernell Norris, PA-C  guaifenesin (HUMIBID E) 400 MG TABS tablet Take 400 mg by mouth every 6 (six) hours as needed (bronchitis).    [provider]  HYDROcodone -acetaminophen  (NORCO) 7.5-325 MG tablet Take 1 tablet by mouth every 6 (six) hours as needed for moderate pain (pain score 4-6).    [provider]  ibuprofen  (ADVIL ) 800 MG tablet Take 800 mg by mouth daily as needed for moderate pain (pain score 4-6) or mild pain (pain score 1-3). 07/30/23   [provider]  promethazine  (PHENERGAN ) 12.5 MG tablet Take 1 tablet (12.5 mg total) by mouth every 6 (six) hours as needed for nausea or vomiting. 02/24/24   Harrison, Stanley E, MD  rOPINIRole (REQUIP) 1 MG tablet Take 1 mg by mouth at bedtime. 01/30/23   [provider]  sertraline  (ZOLOFT ) 50 MG tablet Take 50 mg by mouth daily in the afternoon.    [provider]  Study [employer] - asundexian 50 mg or placebo tablet (PI-Sethi) Take 1  tablet (50 mg total) by mouth daily. For Investigational Use Only. Take at the same time each day (preferably in the morning). Tablet should be swallowed whole with water; it CANNOT be crushed or broken. 02/10/24   Sethi, Pramod S, MD  tamsulosin (FLOMAX) 0.4 MG CAPS capsule Take 0.8 mg by mouth at bedtime. 12/17/23   [provider]    Allergies: Peanut-containing drug products and Morphine     Review of Systems  HENT:  Positive for ear pain.     Updated Vital Signs BP (!) 160/88   Pulse 66   Temp 98.2 F (36.8 C) (Oral)   Resp 16   Ht 5' 10 (1.778 m)   Wt 77.1 kg   SpO2 92%   BMI  24.39 kg/m   Physical Exam Vitals and nursing note reviewed.  Constitutional:      General: He is not in acute distress.    Appearance: He is well-developed.  HENT:     Head: Normocephalic and atraumatic.     Comments: Patient does not seem to be significantly tender to palpation of the mastoid bilaterally, but does report pain with palpation of the right mastoid.    Ears:     Comments: Erythematous right tympanic membrane without bulging.  No perforation of right TM.  Left tympanic membrane without erythema or bulging.    Mouth/Throat:     Mouth: Mucous membranes are moist.     Pharynx: Posterior oropharyngeal erythema present.  Eyes:     Extraocular Movements: Extraocular movements intact.     Conjunctiva/sclera: Conjunctivae normal.     Pupils: Pupils are equal, round, and reactive to light.  Neck:     Comments: Patient with nuchal rigidity, unable to look left or right, upper down without significant pain Cardiovascular:     Rate and Rhythm: Normal rate and regular rhythm.     Heart sounds: No murmur heard. Pulmonary:     Effort: Pulmonary effort is normal. No respiratory distress.     Breath sounds: Normal breath sounds.  Abdominal:     Palpations: Abdomen is soft.     Tenderness: There is no abdominal tenderness.  Musculoskeletal:        General: No swelling.     Cervical back: Rigidity present.     Comments: Tender to palpation over the upper cervical spine and right cervical paraspinal musculature   Skin:    General: Skin is warm and dry.     Capillary Refill: Capillary refill takes less than 2 seconds.  Neurological:     Mental Status: He is alert.  Psychiatric:        Mood and Affect: Mood normal.     (all labs ordered are listed, but only abnormal results are displayed) Labs Reviewed  CBC WITH DIFFERENTIAL/PLATELET - Abnormal; Notable for the following components:      Result Value   RBC 6.00 (*)    Hemoglobin 17.6 (*)    HCT 52.7 (*)    All other  components within normal limits  COMPREHENSIVE METABOLIC PANEL WITH GFR - Abnormal; Notable for the following components:   ALT 50 (*)    Alkaline Phosphatase 139 (*)    All other components within normal limits    EKG: None  Radiology: CT Soft Tissue Neck W Contrast Result Date: 06/21/2024 CLINICAL DATA:  Initial evaluation for acute neck pain. EXAM: CT NECK WITH CONTRAST TECHNIQUE: Multidetector CT imaging of the neck was performed using the standard protocol following the bolus administration  of intravenous contrast. RADIATION DOSE REDUCTION: This exam was performed according to the departmental dose-optimization program which includes automated exposure control, adjustment of the mA and/or kV according to patient size and/or use of iterative reconstruction technique. CONTRAST:  75mL OMNIPAQUE  IOHEXOL  350 MG/ML SOLN COMPARISON:  None Available. FINDINGS: Pharynx and larynx: Oral cavity within normal limits. Oropharynx and nasopharynx unremarkable. No retropharyngeal collection. Negative epiglottis. Hypopharynx and supraglottic larynx within normal limits. Negative glottis. Subglottic airway clear. Salivary glands: Subtle asymmetric hyperattenuation of the right parotid gland as for the left, suspicious for mild and/or early acute parotitis. No obstructive stone. No abscess. Left parotid gland and submandibular glands are normal. Thyroid: Normal. Lymph nodes: No enlarged or pathologic lymph nodes within the neck. Vascular: Atherosclerosis.  Chronic right ICA occlusion. Limited intracranial: Unremarkable. Visualized orbits: Unremarkable. Mastoids and visualized paranasal sinuses: Scattered mucosal thickening about the ethmoidal air cells. Right mastoid and middle ear effusion. Left mastoid air cells are clear. Skeleton: No worrisome osseous lesions. Mild-to-moderate spondylosis at C5-6 and C6-7. Upper chest: Emphysema.  No other acute finding. Other: None. IMPRESSION: 1. Subtle asymmetric  hyperattenuation of the right parotid gland as compared to the left, suspicious for mild and/or early acute parotitis. No obstructive stone or abscess. 2. Right mastoid and middle ear effusion, suggesting concomitant otomastoiditis. 3. Chronic right ICA occlusion. Aortic Atherosclerosis (ICD10-I70.0) and Emphysema (ICD10-J43.9). Electronically Signed   By: Morene Hoard M.D.   On: 06/21/2024 20:22   CT C-SPINE NO CHARGE Result Date: 06/21/2024 CLINICAL DATA:  Neck stiffness. EXAM: CT CERVICAL SPINE WITHOUT CONTRAST TECHNIQUE: Multidetector CT imaging of the cervical spine was performed without intravenous contrast. Multiplanar CT image reconstructions were also generated. RADIATION DOSE REDUCTION: This exam was performed according to the departmental dose-optimization program which includes automated exposure control, adjustment of the mA and/or kV according to patient size and/or use of iterative reconstruction technique. COMPARISON:  None Available. FINDINGS: Alignment: Normal. Skull base and vertebrae: No acute fracture. No primary bone lesion or focal pathologic process. Soft tissues and spinal canal: No prevertebral fluid or swelling. No visible canal hematoma. Disc levels: Mild-to-moderate severity multilevel endplate sclerosis is seen throughout all levels of the cervical spine. Mild anterior osteophyte formation and mild to moderate severity posterior bony spurring are also seen at the levels of C3-C4, C4-C5, C5-C6 and C6-C7. There is marked severity intervertebral disc space narrowing at the level of C6-C7, with moderate severity intervertebral disc space narrowing throughout the remainder of the cervical spine. Mild, bilateral multilevel facet joint hypertrophy is noted. Upper chest: Negative. Other: None. IMPRESSION: 1. No acute fracture or subluxation of the cervical spine. 2. Moderate to marked severity multilevel degenerative changes, most prominent at the level of C6-C7. Electronically  Signed   By: Suzen Dials M.D.   On: 06/21/2024 19:58   CT Maxillofacial W Contrast Result Date: 06/21/2024 CLINICAL DATA:  Neck stiffness EXAM: CT MAXILLOFACIAL WITH CONTRAST TECHNIQUE: Multidetector CT imaging of the maxillofacial structures was performed with intravenous contrast. Multiplanar CT image reconstructions were also generated. RADIATION DOSE REDUCTION: This exam was performed according to the departmental dose-optimization program which includes automated exposure control, adjustment of the mA and/or kV according to patient size and/or use of iterative reconstruction technique. CONTRAST:  75mL OMNIPAQUE  IOHEXOL  350 MG/ML SOLN COMPARISON:  None Available. FINDINGS: Osseous: No fracture or mandibular dislocation. No destructive process. Orbits: Negative. No traumatic or inflammatory finding. Sinuses: There is marked severity bilateral ethmoid sinus mucosal thickening. Soft tissues: Negative. Limited intracranial: No significant or unexpected  finding. IMPRESSION: Marked severity bilateral ethmoid sinus disease. Electronically Signed   By: Suzen Dials M.D.   On: 06/21/2024 19:50   CT Head Wo Contrast Result Date: 06/21/2024 CLINICAL DATA:  Neck stiffness. EXAM: CT HEAD WITHOUT CONTRAST TECHNIQUE: Contiguous axial images were obtained from the base of the skull through the vertex without intravenous contrast. RADIATION DOSE REDUCTION: This exam was performed according to the departmental dose-optimization program which includes automated exposure control, adjustment of the mA and/or kV according to patient size and/or use of iterative reconstruction technique. COMPARISON:  Feb 05, 2022 FINDINGS: Brain: No evidence of acute infarction, hemorrhage, hydrocephalus, extra-axial collection or mass lesion/mass effect. Vascular: Moderate to marked severity bilateral cavernous carotid artery calcification is noted. Skull: Normal. Negative for fracture or focal lesion. Sinuses/Orbits: There is marked  severity bilateral ethmoid sinus mucosal thickening. Other: None. IMPRESSION: 1. No acute intracranial abnormality. 2. Marked severity bilateral ethmoid sinus disease. Electronically Signed   By: Suzen Dials M.D.   On: 06/21/2024 19:48     Procedures   Medications Ordered in the ED  ciprofloxacin (CIPRO) tablet 750 mg (has no administration in time range)  amoxicillin -clavulanate (AUGMENTIN) 875-125 MG per tablet 1 tablet (has no administration in time range)  fentaNYL  (SUBLIMAZE ) injection 50 mcg (50 mcg Intravenous Given 06/21/24 1731)  acetaminophen  (TYLENOL ) tablet 1,000 mg (1,000 mg Oral Given 06/21/24 1732)  iohexol  (OMNIPAQUE ) 350 MG/ML injection 75 mL (75 mLs Intravenous Contrast Given 06/21/24 1922)  rOPINIRole (REQUIP) tablet 1 mg (1 mg Oral Given 06/21/24 2035)  oxyCODONE  (Oxy IR/ROXICODONE ) immediate release tablet 5 mg (5 mg Oral Given 06/21/24 2006)  ketorolac  (TORADOL ) 15 MG/ML injection 15 mg (15 mg Intravenous Given 06/21/24 2006)    Clinical Course as of 06/21/24 2200  Mon Jun 21, 2024  2122 Consulted with Dr. Tobie. Change to PO Cipro and Augmentin 10 days.  [AF]    Clinical Course User Index [AF] Veta Palma, PA-C                                 Medical Decision Making Amount and/or Complexity of Data Reviewed Labs: ordered. Radiology: ordered.  Risk OTC drugs. Prescription drug management.     Differential diagnosis includes but is not limited to bacterial meningitis, viral meningitis, mastoiditis, otitis media, otitis externa, cervical strain, cervical deep space infection  ED Course:  Upon initial evaluation, patient is well appearing, no acute distress. He has a very rigid neck on exam, not moving neck. Significant tenderness when palpating the upper cervical spine as well as the right cervical paraspinal muscles. Reporting right ear pain and does have an erythematous right TM without bulging or perforation. No drainage from the ear. Left TM  normal.   Labs Ordered: I Ordered, and personally interpreted labs.  The pertinent results include:   CBC without leukocytosis.  Hemoglobin elevated at 17.6, appears to be at baseline CMP with mild elevation in alk phos at 139, mild elevation in ALT at 50.  No other abnormalities  Imaging Studies ordered: I ordered imaging studies including CT soft tissue neck with contrast, CT cervical spine, CT maxillofacial with contrast, CT head without contrast I independently visualized the imaging with scope of interpretation limited to determining acute life threatening conditions related to emergency care. Imaging showed  CT soft tissue neck:  IMPRESSION:  1. Subtle asymmetric hyperattenuation of the right parotid gland as  compared to the left, suspicious for mild  and/or early acute  parotitis. No obstructive stone or abscess.  2. Right mastoid and middle ear effusion, suggesting concomitant  otomastoiditis.  3. Chronic right ICA occlusion.    Aortic Atherosclerosis (ICD10-I70.0) and Emphysema (ICD10-J43.9).   CT maxillofacial: IMPRESSION:  Marked severity bilateral ethmoid sinus disease.   CT head and CT cervical spine without acute abnormality  I agree with the radiologist interpretation   Consultations Obtained: I requested consultation with the ENT Dr. Eldora Blanch,  and discussed lab and imaging findings as well as pertinent plan - they recommend: 10-day outpatient course of Augmentin and ciprofloxacin.  Follow-up with him in office within the next week.  Medications Given: Ciprofloxacin Augmentin Toradol  Oxycodone  Tylenol  Ropinirole  Upon re-evaluation, patient reports pain minimally improved with medications given today.  He remains with stable vitals.  We discussed that his CT scan shows likely mastoiditis which would correlate with his symptoms.  ENT was consulted and they recommended outpatient management since there is no abscess noted on his scan today.  Will treat with  10-day course of Augmentin and ciprofloxacin.  First dose was given here today.  He is not have any signs of systemic infection such as fever, tachycardia, leukocytosis.  Low concern for bacterial meningitis given symptoms ongoing for 5 days without significant decompensation, no leukocytosis, fevers.  No indication for LP at this time. The finding of mastoiditis correlates with his symptoms and exam today.  Stable and appropriate for discharge home    Impression: Right otomastoiditis   Disposition:  The patient was discharged home with instructions to take 10-day course of Augmentin and ciprofloxacin as prescribed.  He already has a 7-day course of Augmentin prescribed for him.  He will finish the course he has prescribed, then finish the remaining 3-day course that has been prescribed in addition.  Take full course of ciprofloxacin as prescribed.  Close follow-up with Dr. Blanch within the next week, his contact information was provided to the patient.  Patient understands he needs to call to schedule this appointment.  Tylenol  and ibuprofen  as needed for pain. Return precautions given.    Record Review: External records from outside source obtained and reviewed including urgent care note from 06/19/2024 where he was seen for right otitis media and started on Augmentin     This chart was dictated using voice recognition software, Dragon. Despite the best efforts of this provider to proofread and correct errors, errors may still occur which can change documentation meaning.       Final diagnoses:  Mastoiditis of right side    ED Discharge Orders          Ordered    ciprofloxacin (CIPRO) 750 MG tablet  2 times daily        06/21/24 2151    amoxicillin -clavulanate (AUGMENTIN) 875-125 MG tablet  2 times daily        06/21/24 2151               Veta Palma, PA-C 06/21/24 2200    Clinton Lamar BROCKS, MD 06/26/24 2128

## 2024-06-21 NOTE — ED Triage Notes (Signed)
 Pt arrived via POV c/o stiffness in his neck that started after being Dx with a right ear infection by his PCP. Pt also reports seeing Urgent Care for this as well. Pt currently taking otic drops and oral antibiotics w/o relief. Pt reports calling his PCP again and being advised to seek evaluation in the ER for possible spread of his infection and possibly requiring IV Antibiotics.

## 2024-06-21 NOTE — ED Notes (Signed)
 Pt ambulatory to bathroom without difficulty.

## 2024-06-21 NOTE — ED Notes (Signed)
 Transported to CT

## 2024-07-08 ENCOUNTER — Ambulatory Visit (INDEPENDENT_AMBULATORY_CARE_PROVIDER_SITE_OTHER)

## 2024-07-08 ENCOUNTER — Encounter (INDEPENDENT_AMBULATORY_CARE_PROVIDER_SITE_OTHER): Payer: Self-pay

## 2024-07-08 VITALS — BP 127/78 | HR 78 | Temp 97.6°F | Wt 178.0 lb

## 2024-07-08 DIAGNOSIS — J0120 Acute ethmoidal sinusitis, unspecified: Secondary | ICD-10-CM

## 2024-07-08 DIAGNOSIS — R52 Pain, unspecified: Secondary | ICD-10-CM

## 2024-07-08 DIAGNOSIS — H9391 Unspecified disorder of right ear: Secondary | ICD-10-CM | POA: Diagnosis not present

## 2024-07-08 DIAGNOSIS — M7918 Myalgia, other site: Secondary | ICD-10-CM

## 2024-07-08 NOTE — Progress Notes (Signed)
 HPI:   Clinton Ross is a 58 y.o. male who presents as a new patient being seen in consultation for right ear pain.  Patient states that he was seen in the ED for right ear pain approximately 2 weeks ago.  Patient states that at the time he had pain going from his ear into the bone behind the ear.  He states that he was told he had an infection in the bone behind his ear.  He underwent a CT scan at that time.  Patient was discharged with antibiotics which he states he completed.  He states since completion of his antibiotics he does feel that he has improvement in symptoms.  He does state he is still somewhat congested and does have pain under the bone behind his ear.  He states that the muscle in his neck on the right side does feel significantly tight and painful.  Patient has been smoking half pack a day for over 30 years.  Denies any other recreational drug use.  PMH/Meds/All/SocHx/FamHx/ROS: Past Medical History:  Diagnosis Date   Acute pancreatitis 10/2013   Arthritis    Bulging disc    Cancer (HCC)    Colon   Chronic knee pain    Colostomy in place [facility])    Gallstones    Hypertension    PTSD (post-traumatic stress disorder)    Sciatica    Sleep apnea    Stroke (HCC) 2023   light per pt   Past Surgical History:  Procedure Laterality Date   CHOLECYSTECTOMY     COLON SURGERY     ELBOW SURGERY Left 2024   KNEE ARTHROSCOPY WITH LATERAL MENISECTOMY Right 02/24/2024   Procedure: ARTHROSCOPY, KNEE, WITH LATERAL MENISCECTOMY AND MEDIAL MENISCECTOMY;  Surgeon: Margrette Taft BRAVO, MD;  Location: [facility];  Service: Orthopedics;  Laterality: Right;   rectal cancer Right    No family history of bleeding disorders, wound healing problems or difficulty with anesthesia.  Social Connections: Unknown (04/24/2022)   Received from [facility]   Social Network    Social Network: Not on file    Current Outpatient Medications:    amLODipine  (NORVASC ) 5 MG tablet, Take 1 tablet (5 mg total) by  mouth daily. (Patient taking differently: Take 10 mg by mouth at bedtime.), Disp: 30 tablet, Rfl: 0   amoxicillin -clavulanate (AUGMENTIN) 875-125 MG tablet, Take 1 tablet by mouth every 12 (twelve) hours., Disp: 14 tablet, Rfl: 0   atorvastatin  (LIPITOR) 40 MG tablet, Take 1 tablet (40 mg total) by mouth daily., Disp: 30 tablet, Rfl: 0   cetirizine (ZYRTEC) 10 MG tablet, Take 10 mg by mouth at bedtime., Disp: , Rfl:    clopidogrel  (PLAVIX ) 75 MG tablet, Take 1 tablet (75 mg total) by mouth daily., Disp: 30 tablet, Rfl: 0   cyclobenzaprine  (FLEXERIL ) 10 MG tablet, Take 1 tablet (10 mg total) by mouth at bedtime. (Patient taking differently: Take 10 mg by mouth daily as needed for muscle spasms.), Disp: 15 tablet, Rfl: 0   fluticasone  (FLONASE ) 50 MCG/ACT nasal spray, Place 1 spray into both nostrils 2 (two) times daily. (Patient taking differently: Place 2 sprays into both nostrils daily.), Disp: 16 g, Rfl: 0   guaifenesin (HUMIBID E) 400 MG TABS tablet, Take 400 mg by mouth every 6 (six) hours as needed (bronchitis)., Disp: , Rfl:    HYDROcodone -acetaminophen  (NORCO) 7.5-325 MG tablet, Take 1 tablet by mouth every 6 (six) hours as needed for moderate pain (pain score 4-6)., Disp: , Rfl:  ibuprofen  (ADVIL ) 800 MG tablet, Take 800 mg by mouth daily as needed for moderate pain (pain score 4-6) or mild pain (pain score 1-3)., Disp: , Rfl:    promethazine  (PHENERGAN ) 12.5 MG tablet, Take 1 tablet (12.5 mg total) by mouth every 6 (six) hours as needed for nausea or vomiting., Disp: 30 tablet, Rfl: 0   rOPINIRole (REQUIP) 1 MG tablet, Take 1 mg by mouth at bedtime., Disp: , Rfl:    sertraline  (ZOLOFT ) 50 MG tablet, Take 50 mg by mouth daily in the afternoon., Disp: , Rfl:    Study - OCEANIC-STROKE - asundexian 50 mg or placebo tablet (PI-Sethi), Take 1 tablet (50 mg total) by mouth daily. For Investigational Use Only. Take at the same time each day (preferably in the morning). Tablet should be swallowed  whole with water; it CANNOT be crushed or broken., Disp: 196 tablet, Rfl: 0   tamsulosin (FLOMAX) 0.4 MG CAPS capsule, Take 0.8 mg by mouth at bedtime., Disp: , Rfl:  A complete ROS was performed with pertinent positives/negatives noted in the HPI. The remainder of the ROS are negative.   Physical Exam:  BP 127/78 (BP Location: Left Arm, Patient Position: Sitting, Cuff Size: Normal)   Pulse 78   Temp 97.6 F (36.4 C) (Oral)   Wt 178 lb (80.7 kg)   SpO2 95%   BMI 25.54 kg/m  General: Well developed, well nourished. No acute distress. Voice normal Head/Face: Normocephalic. No sinus tenderness. Facial nerve intact and equal bilaterally. No facial lacerations. Eyes: PERRL, no scleral icterus or conjunctival hemorrhage. EOMI. Ears: No gross deformity. Normal external canal. Tympanic membrane in tact bilaterally. No pain on palpation of mastoid, no fluctuance.  Hearing: Normal speech reception. Nose: No gross deformity or lesions. No purulent discharge. No turbinate hypertrophy. Mouth/Oropharynx: Lips without any lesions. Dentition fair. No mucosal lesions within the oropharynx. No tonsillar enlargement, exudate, or lesions. Pharyngeal walls symmetrical. Uvula midline. Tongue midline without lesions. Larynx: See TFL if applicable Nasopharynx: See TFL if applicable Neck: Trachea midline. No masses. No thyromegaly or nodules palpated. No crepitus. Right SCM and Trapezius pain on application of pressure no overlying skin changes Lymphatic: No lymphadenopathy in the neck. Respiratory: No stridor or distress. Room air. Cardiovascular: Regular rate and rhythm. Extremities: No edema or cyanosis. Warm and well-perfused. Skin: No scars or lesions on face or neck. Neurologic: CN II-XII grossly intact. Moving all extremities without gross abnormality. Other:  Independent Review of Additional Tests or Records: 06/21/2024 CT Maxillofacial with contrast - significant inflammatory changes of the ethmoid  sinuses bilaterally  06/21/2024 CT Neck with contrast - right mastoid effusion without coalescence  Procedures: None  Impression & Plans: Clinton Ross is a 58 y.o. male with right ear pain who presented to the ED 2 weeks ago. Patient was treated with Ciprofloxacin and Augmentin. Patient has completed his course of antibiotics and has had improvement in his symptoms. He does have persistent musculoskeletal pain in the SCM and trapezius on the right side.   Mastoid effusion - right mastoid effusion seen on imaging - no signs of mastoiditis on exam  Musculoskeletal pain - SCM and Trapezius tenderness on ROM and palpation  - recommend warm compress to the area - recommend NSAIDs for pain relief  Follow-up as needed.  Adah Malkin, DO [facility]

## 2024-10-18 ENCOUNTER — Other Ambulatory Visit (HOSPITAL_COMMUNITY): Payer: Self-pay | Admitting: Physician Assistant

## 2024-10-18 ENCOUNTER — Other Ambulatory Visit (HOSPITAL_COMMUNITY): Payer: Self-pay | Admitting: Chiropractic Medicine

## 2024-10-18 DIAGNOSIS — R221 Localized swelling, mass and lump, neck: Secondary | ICD-10-CM

## 2024-10-18 DIAGNOSIS — M5416 Radiculopathy, lumbar region: Secondary | ICD-10-CM

## 2024-11-08 ENCOUNTER — Ambulatory Visit (HOSPITAL_COMMUNITY)
